# Patient Record
Sex: Female | Born: 1960
Health system: Southern US, Community
[De-identification: ages and names within clinical notes are randomized; demographics above are authoritative.]

## PROBLEM LIST (undated history)

## (undated) DIAGNOSIS — R0602 Shortness of breath: Secondary | ICD-10-CM

## (undated) DIAGNOSIS — E785 Hyperlipidemia, unspecified: Secondary | ICD-10-CM

## (undated) DIAGNOSIS — C801 Malignant (primary) neoplasm, unspecified: Secondary | ICD-10-CM

## (undated) DIAGNOSIS — M1990 Unspecified osteoarthritis, unspecified site: Secondary | ICD-10-CM

## (undated) DIAGNOSIS — F1921 Other psychoactive substance dependence, in remission: Secondary | ICD-10-CM

## (undated) DIAGNOSIS — R29898 Other symptoms and signs involving the musculoskeletal system: Secondary | ICD-10-CM

## (undated) DIAGNOSIS — Z87898 Personal history of other specified conditions: Secondary | ICD-10-CM

## (undated) DIAGNOSIS — R7303 Prediabetes: Secondary | ICD-10-CM

## (undated) DIAGNOSIS — B182 Chronic viral hepatitis C: Secondary | ICD-10-CM

## (undated) DIAGNOSIS — K644 Residual hemorrhoidal skin tags: Secondary | ICD-10-CM

## (undated) DIAGNOSIS — K6282 Dysplasia of anus: Secondary | ICD-10-CM

## (undated) DIAGNOSIS — Z8619 Personal history of other infectious and parasitic diseases: Secondary | ICD-10-CM

## (undated) DIAGNOSIS — G609 Hereditary and idiopathic neuropathy, unspecified: Secondary | ICD-10-CM

## (undated) DIAGNOSIS — Z9989 Dependence on other enabling machines and devices: Secondary | ICD-10-CM

## (undated) DIAGNOSIS — I10 Essential (primary) hypertension: Secondary | ICD-10-CM

## (undated) DIAGNOSIS — Z85830 Personal history of malignant neoplasm of bone: Secondary | ICD-10-CM

## (undated) DIAGNOSIS — M79601 Pain in right arm: Secondary | ICD-10-CM

## (undated) DIAGNOSIS — K449 Diaphragmatic hernia without obstruction or gangrene: Secondary | ICD-10-CM

## (undated) DIAGNOSIS — K5730 Diverticulosis of large intestine without perforation or abscess without bleeding: Secondary | ICD-10-CM

## (undated) DIAGNOSIS — K219 Gastro-esophageal reflux disease without esophagitis: Secondary | ICD-10-CM

## (undated) DIAGNOSIS — F1991 Other psychoactive substance use, unspecified, in remission: Secondary | ICD-10-CM

## (undated) DIAGNOSIS — Z8719 Personal history of other diseases of the digestive system: Secondary | ICD-10-CM

## (undated) DIAGNOSIS — M79602 Pain in left arm: Secondary | ICD-10-CM

## (undated) DIAGNOSIS — K643 Fourth degree hemorrhoids: Secondary | ICD-10-CM

## (undated) DIAGNOSIS — I219 Acute myocardial infarction, unspecified: Secondary | ICD-10-CM

## (undated) DIAGNOSIS — G4733 Obstructive sleep apnea (adult) (pediatric): Secondary | ICD-10-CM

## (undated) DIAGNOSIS — Z973 Presence of spectacles and contact lenses: Secondary | ICD-10-CM

## (undated) DIAGNOSIS — K862 Cyst of pancreas: Secondary | ICD-10-CM

## (undated) DIAGNOSIS — G4730 Sleep apnea, unspecified: Secondary | ICD-10-CM

## (undated) DIAGNOSIS — F1910 Other psychoactive substance abuse, uncomplicated: Secondary | ICD-10-CM

## (undated) DIAGNOSIS — K7460 Unspecified cirrhosis of liver: Secondary | ICD-10-CM

## (undated) HISTORY — PX: COLONOSCOPY WITH PROPOFOL: SHX5780

## (undated) HISTORY — PX: UPPER GASTROINTESTINAL ENDOSCOPY: SHX188

## (undated) HISTORY — DX: Dysplasia of anus: K62.82

## (undated) HISTORY — PX: ESOPHAGOGASTRODUODENOSCOPY (EGD) WITH PROPOFOL: SHX5813

## (undated) HISTORY — PX: COLONOSCOPY: SHX174

## (undated) HISTORY — PX: ESOPHAGOGASTRODUODENOSCOPY (EGD) WITH ESOPHAGEAL DILATION: SHX5812

## (undated) HISTORY — PX: TONSILLECTOMY: SUR1361

---
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
## 1980-04-02 HISTORY — PX: ABOVE KNEE LEG AMPUTATION: SUR20

## 1999-11-28 ENCOUNTER — Encounter
Admission: RE | Admit: 1999-11-28 | Discharge: 2000-02-26 | Payer: Self-pay | Admitting: Physical Medicine & Rehabilitation

## 2000-06-05 ENCOUNTER — Emergency Department (HOSPITAL_COMMUNITY): Admission: EM | Admit: 2000-06-05 | Discharge: 2000-06-05 | Payer: Self-pay | Admitting: Unknown Physician Specialty

## 2000-09-07 ENCOUNTER — Emergency Department (HOSPITAL_COMMUNITY): Admission: EM | Admit: 2000-09-07 | Discharge: 2000-09-07 | Payer: Self-pay | Admitting: Emergency Medicine

## 2000-10-09 ENCOUNTER — Encounter: Payer: Self-pay | Admitting: Emergency Medicine

## 2000-10-09 ENCOUNTER — Encounter (INDEPENDENT_AMBULATORY_CARE_PROVIDER_SITE_OTHER): Payer: Self-pay | Admitting: *Deleted

## 2000-10-10 ENCOUNTER — Inpatient Hospital Stay (HOSPITAL_COMMUNITY): Admission: EM | Admit: 2000-10-10 | Discharge: 2000-10-13 | Payer: Self-pay | Admitting: *Deleted

## 2001-03-19 ENCOUNTER — Emergency Department (HOSPITAL_COMMUNITY): Admission: EM | Admit: 2001-03-19 | Discharge: 2001-03-19 | Payer: Self-pay | Admitting: Emergency Medicine

## 2001-03-19 ENCOUNTER — Encounter: Payer: Self-pay | Admitting: Emergency Medicine

## 2003-11-11 ENCOUNTER — Emergency Department (HOSPITAL_COMMUNITY): Admission: EM | Admit: 2003-11-11 | Discharge: 2003-11-11 | Payer: Self-pay

## 2006-05-23 ENCOUNTER — Ambulatory Visit: Payer: Self-pay | Admitting: Internal Medicine

## 2006-05-24 ENCOUNTER — Ambulatory Visit: Payer: Self-pay | Admitting: *Deleted

## 2006-12-18 ENCOUNTER — Encounter (INDEPENDENT_AMBULATORY_CARE_PROVIDER_SITE_OTHER): Payer: Self-pay | Admitting: *Deleted

## 2007-05-21 ENCOUNTER — Encounter: Admission: RE | Admit: 2007-05-21 | Discharge: 2007-05-21 | Payer: Self-pay | Admitting: Family Medicine

## 2007-06-03 ENCOUNTER — Ambulatory Visit (HOSPITAL_COMMUNITY): Admission: RE | Admit: 2007-06-03 | Discharge: 2007-06-03 | Payer: Self-pay | Admitting: Obstetrics

## 2007-06-13 ENCOUNTER — Ambulatory Visit: Payer: Self-pay | Admitting: Gastroenterology

## 2007-06-13 DIAGNOSIS — R1012 Left upper quadrant pain: Secondary | ICD-10-CM | POA: Insufficient documentation

## 2007-06-13 DIAGNOSIS — R1319 Other dysphagia: Secondary | ICD-10-CM | POA: Insufficient documentation

## 2007-06-24 ENCOUNTER — Ambulatory Visit: Payer: Self-pay | Admitting: Gastroenterology

## 2007-07-25 ENCOUNTER — Ambulatory Visit: Payer: Self-pay | Admitting: Gastroenterology

## 2008-02-03 ENCOUNTER — Ambulatory Visit (HOSPITAL_COMMUNITY): Admission: RE | Admit: 2008-02-03 | Discharge: 2008-02-03 | Payer: Self-pay | Admitting: Obstetrics

## 2008-03-11 ENCOUNTER — Encounter: Admission: RE | Admit: 2008-03-11 | Discharge: 2008-03-11 | Payer: Self-pay | Admitting: Orthopedic Surgery

## 2008-04-15 ENCOUNTER — Ambulatory Visit: Payer: Self-pay | Admitting: Gastroenterology

## 2008-04-20 ENCOUNTER — Encounter: Payer: Self-pay | Admitting: Gastroenterology

## 2008-04-20 ENCOUNTER — Ambulatory Visit (HOSPITAL_COMMUNITY): Admission: RE | Admit: 2008-04-20 | Discharge: 2008-04-20 | Payer: Self-pay | Admitting: Gastroenterology

## 2008-04-28 ENCOUNTER — Ambulatory Visit (HOSPITAL_COMMUNITY): Admission: RE | Admit: 2008-04-28 | Discharge: 2008-04-28 | Payer: Self-pay | Admitting: Gastroenterology

## 2008-04-28 ENCOUNTER — Ambulatory Visit: Payer: Self-pay | Admitting: Gastroenterology

## 2008-06-21 ENCOUNTER — Telehealth: Payer: Self-pay | Admitting: Gastroenterology

## 2009-02-08 ENCOUNTER — Ambulatory Visit (HOSPITAL_COMMUNITY): Admission: RE | Admit: 2009-02-08 | Discharge: 2009-02-08 | Payer: Self-pay | Admitting: Family Medicine

## 2009-02-11 ENCOUNTER — Encounter (INDEPENDENT_AMBULATORY_CARE_PROVIDER_SITE_OTHER): Payer: Self-pay | Admitting: *Deleted

## 2009-02-14 ENCOUNTER — Ambulatory Visit: Payer: Self-pay | Admitting: Gastroenterology

## 2009-02-16 ENCOUNTER — Ambulatory Visit (HOSPITAL_COMMUNITY): Admission: RE | Admit: 2009-02-16 | Discharge: 2009-02-16 | Payer: Self-pay | Admitting: Family Medicine

## 2009-02-25 ENCOUNTER — Encounter: Admission: RE | Admit: 2009-02-25 | Discharge: 2009-02-25 | Payer: Self-pay | Admitting: Family Medicine

## 2009-02-28 ENCOUNTER — Ambulatory Visit: Payer: Self-pay | Admitting: Gastroenterology

## 2010-07-05 LAB — COMPREHENSIVE METABOLIC PANEL
ALT: 21 U/L (ref 0–35)
AST: 31 U/L (ref 0–37)
Albumin: 3.6 g/dL (ref 3.5–5.2)
Calcium: 9.5 mg/dL (ref 8.4–10.5)
GFR calc Af Amer: >60 mL/min (ref >60)
Sodium: 135 mEq/L (ref 135–145)
Total Protein: 7.4 g/dL (ref 6.0–8.3)

## 2010-07-05 LAB — CBC
MCHC: 35 g/dL (ref 30.0–36.0)
Platelets: 282 10*3/uL (ref 150–400)
RBC: 4.37 MIL/uL (ref 3.87–5.11)
RDW: 12.9 % (ref 11.5–15.5)

## 2010-07-05 LAB — LIPID PANEL
HDL: 56 mg/dL (ref >39)
LDL Cholesterol: 150 mg/dL — ABNORMAL HIGH (ref 0–99)
Triglycerides: 133 mg/dL (ref <150)
VLDL: 27 mg/dL (ref 0–40)

## 2010-08-15 NOTE — Letter (Signed)
June 13, 2007    [name]   RE:  [name], PREHN  MRN:  [id number]  /  DOB:  1961/01/27   Dear [name]:   It is my pleasure to have treated you recently as a new patient in my  office.  I appreciate your confidence and the opportunity to participate  in your care.   Since I do have a busy inpatient endoscopy schedule and office schedule,  my office hours vary weekly.  I am, however, available for emergency  calls every day through my office.  If I cannot promptly meet an urgent  office appointment, another one of our gastroenterologists will be able  to assist you.   My well-trained staff are prepared to help you at all times.  For  emergencies after office hours, a physician from our gastroenterology  section is always available through my 24-hour answering service.   While you are under my care, I encourage discussion of your questions  and concerns, and I will be happy to return your calls as soon as I am  available.   Once again, I welcome you as a new patient and I look forward to a happy  and healthy relationship.    Sincerely,      Barbette Hair. Arlyce Dice, MD,FACG  Electronically Signed   RDK/MedQ  DD: 06/13/2007  DT: 06/13/2007  Job #: [id number]

## 2010-08-15 NOTE — Letter (Signed)
June 13, 2007    Dr. Charlynne Pander. Thomas   RE:  Stacy, LIVAS  MRN:  [id number]  /  DOB:  03-Jan-1961   Dear Dr. Bruna Potter:   Upon your kind referral, I had the pleasure of evaluating your patient  and I am pleased to offer my findings.  I saw Stacy Thomas in the office  today.  Enclosed is a copy of my progress note that details my findings  and recommendations.   Thank you for the opportunity to participate in your patient's care.    Sincerely,      Barbette Hair. Arlyce Dice, MD,FACG  Electronically Signed    RDK/MedQ  DD: 06/13/2007  DT: 06/13/2007  Job #: [id number]

## 2010-08-15 NOTE — Assessment & Plan Note (Signed)
[facility]                         GASTROENTEROLOGY OFFICE NOTE   NAME:Stacy Thomas, Stacy Thomas                       MRN:          [id number]  DATE:07/25/2007                            DOB:          04-27-1960    PROBLEM:  Esophageal stricture.   Stacy Thomas has returned following upper endoscopy and esophageal  dilatation.  At this point, she feels quite well and has no GI  complaints.  She no longer has dysphagia or pain.   PHYSICAL EXAMINATION:  Pulse 82, blood pressure 104/80, weight 142.   IMPRESSION:  Esophageal stricture - asymptomatic following dilatation  therapy.   RECOMMENDATIONS:  Follow up p.r.n.     Barbette Hair. Arlyce Dice, MD,FACG  Electronically Signed    RDK/MedQ  DD: 07/25/2007  DT: 07/25/2007  Job #: [id number]   cc:   Clyda Greener, MD

## 2010-08-15 NOTE — Assessment & Plan Note (Signed)
[facility]                         GASTROENTEROLOGY OFFICE NOTE   NAME:Thomas Thomas Thomas                       MRN:          [id number]  DATE:06/13/2007                            DOB:          December 15, 1960    REFERRING PHYSICIAN:  ALVIN BLOUNT   REASON FOR CONSULTATION:  Epigastric pain.   Thomas Thomas is a pleasant, 50 year old African-American female, referred  through the courtesy of Dr. Bruna Potter for evaluation.  For several months,  she has been complaining of upper abdominal immediate postprandial  abdominal pain.  She actually has dysphagia to solids with onset of  pain.  She has occasional pyrosis.  She has had frequent episodes of  nausea with vomiting.  She is on no gastric irritants, including  nonsteroidals.  There is no history of change in bowel habits.   PAST MEDICAL HISTORY:  Pertinent for hypertension and  hypercholesterolemia.  She is status post left AKA for an osteosarcoma  in 1980.  She complains of recent soreness in the lower abdomen, which  she attributes to her new prosthesis.   FAMILY HISTORY:  Noncontributory.   MEDICATIONS:  Include Dyazide, Detrol-LA, Nasacort, fluticasone and  amoxicillin.   She is ALLERGIC TO MORPHINE.   She smokes a pack to a pack and a half a day.  She does not drink.  She  is single.   REVIEW OF SYSTEMS:  Positive for loss of hearing and leakage of urine.   PHYSICAL EXAMINATION:  Pulse is 80, blood pressure 118/74, weight 153.  HEENT: EOMI.  PERRLA.  Sclerae are anicteric.  Conjunctivae are pink.  NECK:  Supple without thyromegaly, adenopathy or carotid bruits.  CHEST:  Clear to auscultation and percussion without adventitious  sounds.  CARDIAC:  Regular rhythm; normal S1 S2.  There are no murmurs, gallops  or rubs.  ABDOMEN:  There is mild tenderness to palpation in the subxiphoid area  on abdominal exam.  There are no abdominal masses or organomegaly.  EXTREMITIES:  She is status post left AKA.   Full range of motion.  No  cyanosis, clubbing or edema.  RECTAL:  Deferred.   IMPRESSION:  Dysphagia to solids and postprandial upper abdominal pain.  I suspect that Thomas Thomas may have an esophageal stricture.  Active  peptic disease is also a possibility.   RECOMMENDATION:  1. Begin omeprazole 20 mg a day.  2. Upper endoscopy with dilatation as indicated.     Thomas Hair. Arlyce Dice, MD,FACG  Electronically Signed    RDK/MedQ  DD: 06/13/2007  DT: 06/13/2007  Job #: [id number]   cc:   Clyda Greener, MD

## 2010-08-18 NOTE — Discharge Summary (Signed)
[place]  Patient:    Stacy Thomas, Stacy Thomas                       MRN: [id number] Adm. Date:  [id number] Disc. Date: [id number] Attending:  Alfonso Ramus Dictator:   Shon Baton, M.D. CC:         Karlene Einstein, M.D.   Discharge Summary  DISCHARGE DIAGNOSES: 1. Cocaine abuse. 2. Urinary tract infection. 3. Nausea and vomiting. 4. Hypokalemia.  PROCEDURES:  None.  ADMISSION HISTORY:  The patient is a 50 year old black female with a history of polysubstance abuse, multiple admissions for cocaine abuse treatment, and pancreatitis, who presents to the emergency room with symptoms of malaise, nausea, and vomiting of approximately 24-hours duration.  She has been unable to keep liquids or solids down.  Despite attempts to administer p.o. hydration in the ER, she has vomited three times.  She reports associated abdominal pain but no diarrhea.  The patient was seen twice in the ER for the same problem with her last visit on September 07, 2000.  PAST MEDICAL HISTORY:  Notable for osteosarcoma in 1983 with a below the knee amputation.  REVIEW OF SYSTEMS:  The patient denies orthopnea, pedal edema, chest pain, shortness of breath, constipation.  PHYSICAL EXAMINATION:  GENERAL:  Sleepy, arousable, but irritable and uncooperative.  EYES:  Anicteric, no pallor.  CARDIOVASCULAR:  Regular rate and rhythm.  RESPIRATORY:  Clear to auscultation bilaterally.  ABDOMEN:  Soft, no organomegaly, tenderness over epigastrium.  BACK:  Positive for CVA tenderness on the right.  MUSCULOSKELETAL:  Below the knee amputation on the left leg.  NEUROLOGIC/PSYCHIATRIC:  Decreased affect, drowsy, no motor or sensory deficits.  ADMISSION LABORATORY DATA/X-RAY:  White blood cell count of 5.8, hemoglobin of 15.1, platelets 244.  Sodium 137, potassium 2.6, chloride 102, bicarbonate 30, BUN 10, creatinine 0.5, glucose 117.  AST 28, ALT 19, alkaline phosphatase  68. Urinalysis was positive for hemoglobin, ketones (15 mg per dl), urobilinogen, leukocyte esterase, epithelial cells, white blood cells 11-20, red blood cells, bacteria, granular casts.  UA was negative for glucose, bilirubin, pH 7.0.  Specific gravity 1.019.  Urine drug screen was positive for cocaine, negative for all other substances.  Lipase was 18.  HOSPITAL COURSE: #1 - COCAINE ABUSE:  The patient was noted to be intoxicated on admission physical exam.  Urine drug screen was positive for cocaine.  On meeting with physicians on July 11, the patient admitted to use of cocaine, and acknowledged a desire to seek treatment again.  When the care manager met with her on July 12, the patient denied the serious nature of her substance abuse. She is not interested in entering any of the local drug treatment programs. The care manager presented her with information on these programs and the patient was encouraged to seek treatment upon discharge.  #2 - URINARY TRACT INFECTION:  The patient presented to the ED with a urinalysis that was positive for many bacteria and leukocyte esterase.  In addition, she demonstrated CVA tenderness on admission physical exam.  It was thought that she had a urinary tract infection or pyelonephritis despite the fact that she was afebrile and the white blood cell count was normal.  She was placed on IV Cipro 400 mg once a day.  At discharge, it was felt that her UTI had cleared and the patient was not discharged on an oral regimen.  #3 - NAUSEA/VOMITING:  It was initially thought  that the nausea and vomiting was due to urinary tract infection.  Despite repeated trials over the first few days and advancing the diet from clear liquids, the patient continued to have episodes of emesis.  On extensive workup of this nausea and vomiting of unclear origins was initiated.  The patient was started on a trial of Protonix for possible gastritis/peptic ulcer disease.   Pelvic exam was done to rule out vaginitis or sexually transmitted disease.  But the pancreatitis was still a possible cause, and malaise and lipase were rechecked.  The last episode of emesis was on October 11, 2000, though nausea continued through October 12, 2000. At discharge on July 14, it was felt that her nausea was likely due to her prolonged cocaine binge.  Her diet was advanced and the patient tolerated solid foods prior to discharge.  #4 - HYPOKALEMIA:  Potassium was 2.6 on admission.  KCl was added to her IV fluids at a rate of 20 mEq per liter.  Potassium stabilized during her hospital course and KCl was dropped from IV fluids on July 13.  At discharge, potassium was 3.3.  DISCHARGE LABORATORY DATA:  Sodium 137, potassium 3.3, chloride 102, bicarbonate 27, BUN 4, creatinine 0.7, glucose 107.  Calcium 9.5.  TSH 2.175. Amylase 223, lipase 59.  Urine culture was positive for E. coli with a colony count of greater than 100,000 colonies per ml.  HIV test nonreactive. Gonorrhea/chlamydia pronegative.  HOSPITAL FOLLOWUP:  The patient has an appointment at the outpatient clinic [facility], Tuesday, October 15, 2000 at 2 p.m. DD:  10/14/00 TD:  10/14/00 Job: [id number]

## 2011-02-27 ENCOUNTER — Ambulatory Visit
Admission: RE | Admit: 2011-02-27 | Discharge: 2011-02-27 | Disposition: A | Discharge status: Home / Self Care | Payer: Medicaid Other | Source: Ambulatory Visit | Attending: Family Medicine | Admitting: Family Medicine

## 2011-02-27 ENCOUNTER — Other Ambulatory Visit: Payer: Self-pay | Admitting: Family Medicine

## 2011-02-27 DIAGNOSIS — J449 Chronic obstructive pulmonary disease, unspecified: Secondary | ICD-10-CM

## 2011-02-27 DIAGNOSIS — I10 Essential (primary) hypertension: Secondary | ICD-10-CM

## 2011-02-27 DIAGNOSIS — Z1231 Encounter for screening mammogram for malignant neoplasm of breast: Secondary | ICD-10-CM

## 2011-03-20 ENCOUNTER — Other Ambulatory Visit (HOSPITAL_COMMUNITY): Payer: Self-pay | Admitting: Orthopedic Surgery

## 2011-03-23 ENCOUNTER — Ambulatory Visit
Admission: RE | Admit: 2011-03-23 | Discharge: 2011-03-23 | Disposition: A | Discharge status: Home / Self Care | Payer: Medicaid Other | Source: Ambulatory Visit | Attending: Family Medicine | Admitting: Family Medicine

## 2011-03-23 DIAGNOSIS — Z1231 Encounter for screening mammogram for malignant neoplasm of breast: Secondary | ICD-10-CM

## 2011-04-06 ENCOUNTER — Other Ambulatory Visit: Payer: Self-pay | Admitting: Family Medicine

## 2011-04-06 DIAGNOSIS — R928 Other abnormal and inconclusive findings on diagnostic imaging of breast: Secondary | ICD-10-CM

## 2011-04-13 ENCOUNTER — Ambulatory Visit
Admission: RE | Admit: 2011-04-13 | Discharge: 2011-04-13 | Disposition: A | Discharge status: Home / Self Care | Payer: Medicaid Other | Source: Ambulatory Visit | Attending: Family Medicine | Admitting: Family Medicine

## 2011-04-13 ENCOUNTER — Other Ambulatory Visit: Payer: Self-pay | Admitting: Family Medicine

## 2011-04-13 DIAGNOSIS — R928 Other abnormal and inconclusive findings on diagnostic imaging of breast: Secondary | ICD-10-CM

## 2011-04-16 ENCOUNTER — Ambulatory Visit
Admission: RE | Admit: 2011-04-16 | Discharge: 2011-04-16 | Disposition: A | Discharge status: Home / Self Care | Payer: Medicaid Other | Source: Ambulatory Visit | Attending: Family Medicine | Admitting: Family Medicine

## 2011-04-16 ENCOUNTER — Other Ambulatory Visit: Payer: Self-pay | Admitting: Family Medicine

## 2011-04-16 DIAGNOSIS — R928 Other abnormal and inconclusive findings on diagnostic imaging of breast: Secondary | ICD-10-CM

## 2011-04-16 DIAGNOSIS — N6002 Solitary cyst of left breast: Secondary | ICD-10-CM

## 2011-04-16 DIAGNOSIS — N6320 Unspecified lump in the left breast, unspecified quadrant: Secondary | ICD-10-CM

## 2011-04-17 ENCOUNTER — Encounter (HOSPITAL_COMMUNITY): Payer: Self-pay | Admitting: Pharmacy Technician

## 2011-04-24 ENCOUNTER — Encounter (HOSPITAL_COMMUNITY): Payer: Self-pay

## 2011-04-24 ENCOUNTER — Encounter (HOSPITAL_COMMUNITY): Payer: Self-pay | Admitting: Vascular Surgery

## 2011-04-24 ENCOUNTER — Encounter (HOSPITAL_COMMUNITY)
Admission: RE | Admit: 2011-04-24 | Discharge: 2011-04-24 | Disposition: A | Discharge status: Home / Self Care | Payer: Medicaid Other | Source: Ambulatory Visit | Attending: Orthopedic Surgery | Admitting: Orthopedic Surgery

## 2011-04-24 ENCOUNTER — Other Ambulatory Visit: Payer: Self-pay

## 2011-04-24 DIAGNOSIS — Z01810 Encounter for preprocedural cardiovascular examination: Secondary | ICD-10-CM | POA: Insufficient documentation

## 2011-04-24 DIAGNOSIS — Z01812 Encounter for preprocedural laboratory examination: Secondary | ICD-10-CM | POA: Insufficient documentation

## 2011-04-24 DIAGNOSIS — Z01818 Encounter for other preprocedural examination: Secondary | ICD-10-CM | POA: Insufficient documentation

## 2011-04-24 HISTORY — DX: Essential (primary) hypertension: I10

## 2011-04-24 HISTORY — DX: Unspecified osteoarthritis, unspecified site: M19.90

## 2011-04-24 LAB — SURGICAL PCR SCREEN
MRSA, PCR: NEGATIVE
Staphylococcus aureus: POSITIVE — AB

## 2011-04-24 LAB — CBC
MCH: 31.2 pg (ref 26.0–34.0)
MCHC: 34.6 g/dL (ref 30.0–36.0)
Platelets: 211 10*3/uL (ref 150–400)

## 2011-04-24 LAB — COMPREHENSIVE METABOLIC PANEL
ALT: 24 U/L (ref 0–35)
AST: 41 U/L — ABNORMAL HIGH (ref 0–37)
Calcium: 10.5 mg/dL (ref 8.4–10.5)
Sodium: 139 mEq/L (ref 135–145)
Total Protein: 8.1 g/dL (ref 6.0–8.3)

## 2011-04-24 LAB — TYPE AND SCREEN: Antibody Screen: NEGATIVE

## 2011-04-24 LAB — HCG, SERUM, QUALITATIVE

## 2011-04-24 NOTE — Progress Notes (Signed)
Pt reports having intermittent menstrual cycles. Serum HCG obtained. Per Shawn in lab pt's level is at on border of being positive vs negative at 5.4 Spoke with [place], [place] regarding same. Lab is requesting specimen be recollected for verification.

## 2011-04-24 NOTE — Pre-Procedure Instructions (Signed)
20 Stacy Thomas  04/24/2011   Your procedure is scheduled on:  May 02, 2011  Report to [facility] at [phone] AM.  Call this number if you have problems the morning of surgery: [phone]   Remember:   Do not eat food:After Midnight.  May have clear liquids: up to 4 Hours before arrival.  Clear liquids include soda, tea, black coffee, apple or grape juice, broth.  Take these medicines the morning of surgery with A SIP OF WATER:    STOP Advil tomorrow use tylenol  Do not wear jewelry, make-up or nail polish.  Do not wear lotions, powders, or perfumes. You may wear deodorant.  Do not shave 48 hours prior to surgery.  Do not bring valuables to the hospital.  Contacts, dentures or bridgework may not be worn into surgery.  Leave suitcase in the car. After surgery it may be brought to your room.  For patients admitted to the hospital, checkout time is 11:00 AM the day of discharge.   Patients discharged the day of surgery will not be allowed to drive home.  Name and phone number of your driver: Brynda Rim  Special Instructions: CHG Shower Use Special Wash: 1/2 bottle night before surgery and 1/2 bottle morning of surgery.   Please read over the following fact sheets that you were given: Pain Booklet, Coughing and Deep Breathing, Blood Transfusion Information, Total Joint Packet, MRSA Information and Surgical Site Infection Prevention

## 2011-04-27 NOTE — Consult Note (Addendum)
Anesthesia:  Patient is a 51 year old female scheduled for a  Right TKA by Dr. Lajoyce Corners on 05/02/11.  Her history includes smoking, HTN, hypercholesterolemia, osteosarcoma s/p left AKA approximately 30 years ago, illicit drug use (quit 7 years ago).  CXR from 02/27/11 showed no acute cardiopulmonary process.  Labs reviewed.  Was notified by the PAT RN that lab called to report patient's serum HCG was borderline high at 5.4, and they did not feel comfortable calling the test negative or positive.  They recommended it be repeated preoperatively (already ordered).  I reviewed this with Dr. Noreene Larsson on 04/24/11.  He spoke with an  OB/GYN who agreed with this plan.  She indicated that if in fact the patient was pregnant, her HCG would be increased when rechecked on 05/02/11.  A repeated serum HCG, taken eight days after the previous test, that resulted in a level at or below her prior result would be more indicative of a negative test.    EKG showed NSR, septal infarct (age undetermined).  It was not felt significantly changed from her prior EKG from 02/08/09.  No CV symptoms reported at her PAT appointment.  No known history of CAD/MI or DM.  Clinical correlation on the day of surgery, but since her EKG is stable and she has been asymptomatic, then anticipate she can proceed if no new CV symptoms and if repeat serum HCG is negative.

## 2011-05-02 ENCOUNTER — Ambulatory Visit (HOSPITAL_COMMUNITY): Admission: RE | Admit: 2011-05-02 | Payer: Medicaid Other | Source: Ambulatory Visit | Admitting: Orthopedic Surgery

## 2011-05-02 ENCOUNTER — Encounter (HOSPITAL_COMMUNITY): Admission: RE | Payer: Self-pay | Source: Ambulatory Visit

## 2011-05-02 SURGERY — ARTHROPLASTY, KNEE, TOTAL
Anesthesia: General | Site: Knee | Laterality: Right

## 2011-08-23 ENCOUNTER — Other Ambulatory Visit (HOSPITAL_COMMUNITY): Payer: Self-pay | Admitting: Orthopedic Surgery

## 2011-08-30 ENCOUNTER — Encounter (HOSPITAL_COMMUNITY): Payer: Self-pay | Admitting: Vascular Surgery

## 2011-08-30 ENCOUNTER — Inpatient Hospital Stay (HOSPITAL_COMMUNITY): Admission: RE | Admit: 2011-08-30 | Discharge: 2011-08-30 | Payer: Medicaid Other | Source: Ambulatory Visit

## 2011-08-30 NOTE — Pre-Procedure Instructions (Signed)
20 Stacy Thomas  08/30/2011   Your procedure is scheduled on:  September 05, 2011 @ 8:30   Report to [facility] at 6:30 AM.  Call this number if you have problems the morning of surgery: [phone]   Remember:   Do not eat food:After Midnight.  May have clear liquids: up to 4 Hours before arrival.  Clear liquids include soda, tea, black coffee, apple or grape juice, broth.  Take these medicines the morning of surgery with A SIP OF WATER: pravachol (pravastatin)   Do not wear jewelry, make-up or nail polish.  Do not wear lotions, powders, or perfumes. You may wear deodorant.  Do not shave 48 hours prior to surgery. Men may shave face and neck.  Do not bring valuables to the hospital.  Contacts, dentures or bridgework may not be worn into surgery.  Leave suitcase in the car. After surgery it may be brought to your room.  For patients admitted to the hospital, checkout time is 11:00 AM the day of discharge.   Patients discharged the day of surgery will not be allowed to drive home.  Name and phone number of your driver: [name] (spouse) [phone]  Special Instructions: Incentive Spirometry - Practice and bring it with you on the day of surgery. and CHG Shower Use Special Wash: 1/2 bottle night before surgery and 1/2 bottle morning of surgery.   Please read over the following fact sheets that you were given: Pain Booklet, Coughing and Deep Breathing, Blood Transfusion Information, Total Joint Packet and Surgical Site Infection Prevention

## 2011-09-03 ENCOUNTER — Encounter (HOSPITAL_COMMUNITY)
Admission: RE | Admit: 2011-09-03 | Discharge: 2011-09-03 | Disposition: A | Discharge status: Home / Self Care | Payer: Medicaid Other | Source: Ambulatory Visit | Attending: Orthopedic Surgery | Admitting: Orthopedic Surgery

## 2011-09-03 DIAGNOSIS — Z01810 Encounter for preprocedural cardiovascular examination: Secondary | ICD-10-CM | POA: Insufficient documentation

## 2011-09-03 DIAGNOSIS — Z01812 Encounter for preprocedural laboratory examination: Secondary | ICD-10-CM | POA: Insufficient documentation

## 2011-09-03 LAB — COMPREHENSIVE METABOLIC PANEL
ALT: 26 U/L (ref 0–35)
AST: 46 U/L — ABNORMAL HIGH (ref 0–37)
Alkaline Phosphatase: 71 U/L (ref 39–117)
CO2: 21 mEq/L (ref 19–32)
GFR calc Af Amer: >90 mL/min (ref >90)
GFR calc non Af Amer: >90 mL/min (ref >90)
Glucose, Bld: 87 mg/dL (ref 70–99)
Potassium: 3.7 mEq/L (ref 3.5–5.1)
Sodium: 140 mEq/L (ref 135–145)

## 2011-09-03 LAB — SURGICAL PCR SCREEN: Staphylococcus aureus: POSITIVE — AB

## 2011-09-03 LAB — CBC
Hemoglobin: 13.6 g/dL (ref 12.0–15.0)
Platelets: 201 10*3/uL (ref 150–400)
RBC: 4.42 MIL/uL (ref 3.87–5.11)
WBC: 4.7 10*3/uL (ref 4.0–10.5)

## 2011-09-03 LAB — TYPE AND SCREEN: Antibody Screen: NEGATIVE

## 2011-09-03 LAB — APTT: aPTT: 25 seconds (ref 24–37)

## 2011-09-03 NOTE — Progress Notes (Signed)
Notified Cheryl in Dr. Audrie Lia office pt pregnancy test came back positive.

## 2011-09-05 ENCOUNTER — Ambulatory Visit (HOSPITAL_COMMUNITY): Admission: RE | Admit: 2011-09-05 | Payer: Medicaid Other | Source: Ambulatory Visit | Admitting: Orthopedic Surgery

## 2011-09-05 ENCOUNTER — Encounter (HOSPITAL_COMMUNITY): Admission: RE | Payer: Self-pay | Source: Ambulatory Visit

## 2011-09-05 SURGERY — ARTHROPLASTY, KNEE, TOTAL
Anesthesia: General | Site: Knee | Laterality: Right

## 2011-09-12 ENCOUNTER — Other Ambulatory Visit (HOSPITAL_COMMUNITY): Payer: Self-pay | Admitting: Obstetrics

## 2011-09-12 DIAGNOSIS — R102 Pelvic and perineal pain: Secondary | ICD-10-CM

## 2011-09-12 DIAGNOSIS — R109 Unspecified abdominal pain: Secondary | ICD-10-CM

## 2011-09-14 ENCOUNTER — Ambulatory Visit (HOSPITAL_COMMUNITY)
Admission: RE | Admit: 2011-09-14 | Discharge: 2011-09-14 | Disposition: A | Discharge status: Home / Self Care | Payer: Medicaid Other | Source: Ambulatory Visit | Attending: Obstetrics | Admitting: Obstetrics

## 2011-09-14 DIAGNOSIS — N838 Other noninflammatory disorders of ovary, fallopian tube and broad ligament: Secondary | ICD-10-CM | POA: Insufficient documentation

## 2011-09-14 DIAGNOSIS — R109 Unspecified abdominal pain: Secondary | ICD-10-CM

## 2011-09-14 DIAGNOSIS — R102 Pelvic and perineal pain unspecified side: Secondary | ICD-10-CM

## 2011-09-14 DIAGNOSIS — N949 Unspecified condition associated with female genital organs and menstrual cycle: Secondary | ICD-10-CM | POA: Insufficient documentation

## 2012-06-17 ENCOUNTER — Other Ambulatory Visit: Payer: Self-pay

## 2012-06-17 DIAGNOSIS — Z1231 Encounter for screening mammogram for malignant neoplasm of breast: Secondary | ICD-10-CM

## 2012-06-20 ENCOUNTER — Ambulatory Visit
Admission: RE | Admit: 2012-06-20 | Discharge: 2012-06-20 | Disposition: A | Discharge status: Home / Self Care | Payer: PRIVATE HEALTH INSURANCE | Source: Ambulatory Visit

## 2012-06-20 DIAGNOSIS — Z1231 Encounter for screening mammogram for malignant neoplasm of breast: Secondary | ICD-10-CM

## 2012-07-29 ENCOUNTER — Other Ambulatory Visit (HOSPITAL_COMMUNITY): Payer: PRIVATE HEALTH INSURANCE

## 2012-08-06 ENCOUNTER — Inpatient Hospital Stay: Admit: 2012-08-06 | Payer: Self-pay | Admitting: Orthopedic Surgery

## 2012-08-06 SURGERY — ARTHROPLASTY, KNEE, TOTAL
Anesthesia: General | Site: Knee | Laterality: Right

## 2013-04-02 HISTORY — PX: OTHER SURGICAL HISTORY: SHX169

## 2013-05-13 ENCOUNTER — Other Ambulatory Visit: Payer: Self-pay

## 2013-05-13 DIAGNOSIS — Z1231 Encounter for screening mammogram for malignant neoplasm of breast: Secondary | ICD-10-CM

## 2013-06-22 ENCOUNTER — Ambulatory Visit: Payer: PRIVATE HEALTH INSURANCE

## 2013-06-23 ENCOUNTER — Other Ambulatory Visit (HOSPITAL_COMMUNITY): Payer: Self-pay | Admitting: Family Medicine

## 2013-06-23 DIAGNOSIS — Z1231 Encounter for screening mammogram for malignant neoplasm of breast: Secondary | ICD-10-CM

## 2013-07-01 ENCOUNTER — Ambulatory Visit (HOSPITAL_COMMUNITY)
Admission: RE | Admit: 2013-07-01 | Discharge: 2013-07-01 | Disposition: A | Discharge status: Home / Self Care | Payer: PRIVATE HEALTH INSURANCE | Source: Ambulatory Visit | Attending: Family Medicine | Admitting: Family Medicine

## 2013-07-01 DIAGNOSIS — Z1231 Encounter for screening mammogram for malignant neoplasm of breast: Secondary | ICD-10-CM

## 2013-11-13 ENCOUNTER — Ambulatory Visit: Payer: PRIVATE HEALTH INSURANCE

## 2014-06-14 ENCOUNTER — Other Ambulatory Visit (HOSPITAL_COMMUNITY): Payer: Self-pay | Admitting: Family Medicine

## 2014-06-14 DIAGNOSIS — Z1231 Encounter for screening mammogram for malignant neoplasm of breast: Secondary | ICD-10-CM

## 2014-07-07 ENCOUNTER — Ambulatory Visit (HOSPITAL_COMMUNITY)
Admission: RE | Admit: 2014-07-07 | Discharge: 2014-07-07 | Disposition: A | Discharge status: Home / Self Care | Payer: 59 | Source: Ambulatory Visit | Attending: Family Medicine | Admitting: Family Medicine

## 2014-07-07 DIAGNOSIS — Z1231 Encounter for screening mammogram for malignant neoplasm of breast: Secondary | ICD-10-CM | POA: Diagnosis present

## 2014-08-27 ENCOUNTER — Encounter: Payer: Self-pay | Admitting: Gastroenterology

## 2014-09-02 ENCOUNTER — Other Ambulatory Visit: Payer: Self-pay | Admitting: Family Medicine

## 2014-09-02 ENCOUNTER — Ambulatory Visit
Admission: RE | Admit: 2014-09-02 | Discharge: 2014-09-02 | Disposition: A | Discharge status: Home / Self Care | Payer: 59 | Source: Ambulatory Visit | Attending: Family Medicine | Admitting: Family Medicine

## 2014-09-02 DIAGNOSIS — R1084 Generalized abdominal pain: Secondary | ICD-10-CM

## 2014-09-02 DIAGNOSIS — I10 Essential (primary) hypertension: Secondary | ICD-10-CM

## 2014-10-21 ENCOUNTER — Encounter: Payer: Self-pay | Admitting: Physical Therapy

## 2014-10-21 ENCOUNTER — Ambulatory Visit: Payer: 59 | Attending: Family Medicine | Admitting: Physical Therapy

## 2014-10-21 DIAGNOSIS — R269 Unspecified abnormalities of gait and mobility: Secondary | ICD-10-CM | POA: Diagnosis present

## 2014-10-21 DIAGNOSIS — Z89612 Acquired absence of left leg above knee: Secondary | ICD-10-CM | POA: Insufficient documentation

## 2014-10-22 NOTE — Therapy (Signed)
[facility] [address] [place], [place], [address] Phone: [phone]   Fax:  [phone]  Physical Therapy Evaluation  Patient Details  Name: Stacy Thomas MRN: [id number] Date of Birth: Jul 28, 1960 Referring Provider:  Elizabeth Palau, *  Encounter Date: 10/21/2014      PT End of Session - 10/21/14 1015    Visit Number 1   Number of Visits 1   PT Start Time 1010   PT Stop Time 1100   PT Time Calculation (min) 50 min   Activity Tolerance Patient limited by pain   Behavior During Therapy [facility] for tasks assessed/performed      Past Medical History  Diagnosis Date  . Hypertension   . Hypercholesterolemia   . 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 Arthritis   . Osteoarthritis   . Drug abuse     Past Surgical History  Procedure Laterality Date  . Above knee leg amputation  ~ 30 years    left  . Breast biopsy      determine to be a cyst  . Tonsillectomy      child    There were no vitals filed for this visit.  Visit Diagnosis:  Abnormality of gait  Status post above knee amputation of left lower extremity      Subjective Assessment - 10/22/14 0600    Subjective This 54yo female underwent a left Transfemoral Amputation in 1982 due osteosarcoma with no signs of return or METS. She recieved prosthesis (IPOP in hospital) and temporary prosthesis within few months. Her current prosthesis was recieved 11/26/2006 with swing hydraulic knee with stance flexion feature with extension assist, foot Flex walk system; Socket revision June 2014 with suction ring gel liner suspension. She complains of prosthetic issues of distal lateral pain 10/10 with weight bearing, cracked rim on flexible inner socket, knee buckles causing occassional falls, socket turns with gait due to loose fit, posterior brim pushes into buttocks with stance and weight of prosthesis (weighed during eval at 11#). She presents for  PT evaluation to justify new prosthesis.                                [facility] PT Assessment - 10/21/14 1015    Assessment   Medical Diagnosis Left Transfemoral Amputation   Precautions   Precautions Fall   Restrictions   Weight Bearing Restrictions No   Balance Screen   Has the patient fallen in the past 6 months Yes   How many times? ~2 x/wk  sharp pains & knee buckles   Has the patient had a decrease in activity level because of a fear of falling?  Yes   Is the patient reluctant to leave their home because of a fear of falling?  No   Home Environment   Living Environment Private residence   Living Arrangements Spouse/significant other   Type of [place] One level  laundry room has 3 steps no rails   Home Equipment Crutches   Prior Function   Level of Independence Independent;Independent with household mobility without device;Independent with community mobility without device;Independent with gait   Posture/Postural Control   Posture/Postural Control Postural limitations   Postural Limitations Rounded Shoulders;Forward head;Increased lumbar lordosis;Flexed trunk;Weight shift right   ROM / Strength   AROM / PROM / Strength PROM;Strength  PROM   PROM Assessment Site Hip   Right/Left Hip Left   Left Hip Extension -18  Thomas position   Strength   Overall Strength Within functional limits for tasks performed   Strength Assessment Site Hip   Right/Left Hip Left   Left Hip Flexion 5/5   Left Hip Extension 4+/5   Left Hip ABduction 4+/5   Left Hip ADduction 4+/5   Transfers   Transfers Sit to Stand;Stand to Sit;Stand Pivot Transfers   Sit to Stand 6: Modified independent (Device/Increase time);With upper extremity assist;From chair/3-in-1   Stand to Sit 6: Modified independent (Device/Increase time);With upper extremity assist   Stand Pivot Transfers 6: Modified independent (Device/Increase time)   Ambulation/Gait    Ambulation/Gait Yes   Ambulation/Gait Assistance 6: Modified independent (Device/Increase time)   Ambulation Distance (Feet) 700 Feet   Assistive device Prosthesis;None   Gait Pattern Abducted - left;Poor foot clearance - left;Trunk flexed;Trunk rotated posteriorly on left;Lateral trunk lean to right;Antalgic;Left circumduction;Left hip hike;Decreased hip/knee flexion - left;Decreased weight shift to left;Decreased stride length;Decreased stance time - left;Decreased step length - right;Decreased arm swing - left;Step-through pattern   Ambulation Surface Indoor;Level   Gait velocity 1.17 ft/sec   Gait velocity - backwards safe at similar speed to forward, no balance loss   Stairs Yes   Stairs Assistance 6: Modified independent (Device/Increase time)   Stair Management Technique One rail Right;Step to pattern;Forwards   Number of Stairs 4   Ramp 6: Modified independent (Device)  prosthesis only   Ramp Details (indicate cue type and reason) Keeps prosthetic knee locked, abducted   Curb 6: Modified independent (Device/increase time)  prosthesis only   Curb Details (indicate cue type and reason) abduction of prosthesis   6 Minute Walk- Baseline   6 Minute Walk- Baseline yes   BP (mmHg) 114/77 mmHg   HR (bpm) 75   02 Sat (%RA) 99 %   Modified Borg Scale for Dyspnea 0- Nothing at all   6 Minute walk- Post Test   6 Minute Walk Post Test yes   HR (bpm) 84   02 Sat (%RA) 95 %   Modified Borg Scale for Dyspnea 1- Very mild shortness of breath   Perceived Rate of Exertion (Borg) 9- very light   6 minute walk test results    Aerobic Endurance Distance Walked 403   Endurance additional comments Pain 10/10 limiting distance with 4 standing rests 2 leaning against wall   Berg Balance Test   Sit to Stand Able to stand without using hands and stabilize independently   Standing Unsupported Able to stand safely 2 minutes   Sitting with Back Unsupported but Feet Supported on Floor or Stool Able to  sit safely and securely 2 minutes   Stand to Sit Sits safely with minimal use of hands   Transfers Able to transfer safely, minor use of hands   Standing Unsupported with Eyes Closed Able to stand 10 seconds safely   Standing Ubsupported with Feet Together Able to place feet together independently and stand 1 minute safely   From Standing, Reach Forward with Outstretched Arm Can reach confidently >25 cm (10")   From Standing Position, Pick up Object from Floor Able to pick up shoe safely and easily   From Standing Position, Turn to Look Behind Over each Shoulder Looks behind one side only/other side shows less weight shift   Turn 360 Degrees Able to turn 360 degrees safely but slowly   Standing Unsupported, Alternately Place Feet  on Step/Stool Able to stand independently and safely and complete 8 steps in 20 seconds   Standing Unsupported, One Foot in Front Able to plae foot ahead of the other independently and hold 30 seconds   Standing on One Leg Able to lift leg independently and hold 5-10 seconds   Total Score 51   Timed Up and Go Test   Normal TUG (seconds) 12.69   Functional Gait  Assessment   Gait assessed  Yes   Gait Level Surface Walks 20 ft in less than 7 sec but greater than 5.5 sec, uses assistive device, slower speed, mild gait deviations, or deviates 6-10 in outside of the 12 in walkway width.   Change in Gait Speed Able to change speed, demonstrates mild gait deviations, deviates 6-10 in outside of the 12 in walkway width, or no gait deviations, unable to achieve a major change in velocity, or uses a change in velocity, or uses an assistive device.   Gait with Horizontal Head Turns Performs head turns smoothly with no change in gait. Deviates no more than 6 in outside 12 in walkway width   Gait with Vertical Head Turns Performs head turns with no change in gait. Deviates no more than 6 in outside 12 in walkway width.   Gait and Pivot Turn Pivot turns safely in greater than 3 sec  and stops with no loss of balance, or pivot turns safely within 3 sec and stops with mild imbalance, requires small steps to catch balance.   Step Over Obstacle Is able to step over one shoe box (4.5 in total height) without changing gait speed. No evidence of imbalance.   Gait with Narrow Base of Support Ambulates less than 4 steps heel to toe or cannot perform without assistance.   Gait with Eyes Closed Walks 20 ft, no assistive devices, good speed, no evidence of imbalance, normal gait pattern, deviates no more than 6 in outside 12 in walkway width. Ambulates 20 ft in less than 7 sec.   Ambulating Backwards Walks 20 ft, no assistive devices, good speed, no evidence for imbalance, normal gait   Steps Two feet to a stair, must use rail.   Total Score 21         Prosthetics Assessment - 10/21/14 1015    Prosthetics   Prosthetic Care Independent with Skin check;Residual limb care;Prosthetic cleaning;Correct ply sock adjustment;Proper wear schedule/adjustment;Proper weight-bearing schedule/adjustment   Donning prosthesis  Independent   Doffing prosthesis  Independent   Current prosthetic wear tolerance (days/week)  7 days/wk   Current prosthetic wear tolerance (#hours/day)  >90% of awake hours   Current prosthetic weight-bearing tolerance (hours/day)  tolerated 15 minutes of standing for Berg [employer] Gai[employer]   Edema none   Residual limb condition  Distal limb has significant decline in circumference with femur prominent, callous present at distal femur with no adherance with severe tenderness to touch, tissu proximally is loose, flabby muscle / soft tissue, callousing present at ischial tuberosity with pt c/o pressure into rectum with wt on prosthesis, no open areas, normal moisture,    K code/activity level with prosthetic use  K3 Patient ambulates full community level without device and can vary cadence as needed by environment                                       Plan  - 10/21/14 1015    Clinical Impression  Statement This 54yo female has been functioning with a prosthesis for 32 years. Her current 54yo prosthetic knee and foot are K3 hydraulic (allows variable cadence) and dynamic response. These components are required to enable her to continue to function at full community level with lower fall risk. The current prosthesis being ill-fitting is increasing her chronic pain issues of right side sciatica and left side residual limb / phantom pain. A proper fitting prosthesis should decrease pain for both issues. Her Berg Balance score of 51/56 indicates low fall risk and Fucntional Gait Assessment of 21/30 with Transfemoral Amputation indicates community level mobility. PT recommends new K3 level prosthesis with similar components to her current prosthesis: gel silicon liner with seal-in suction ring, ischial containment socket with flexible inner liner brim, K3 hydraulic swing, multi-axial knee that locks with heel strike, stance flexion feature with extension assist (Total Knee), Flex Walk System foot.                        Pt will benefit from skilled therapeutic intervention in order to improve on the following deficits Abnormal gait;Pain   PT Frequency One time visit   PT Next Visit Plan evaluation only   Recommended Other Services PT recommends new K3 level prosthesis with similar components to her current prosthesis: gel silicon liner with seal-in suction ring, ischial containment socket with flexible inner liner brim, K3 hydraulic swing, multi-axial knee that locks with heel strike, stance flexion feature with extension assist (Total Knee), Flex Walk System foot.                        Consulted and Agree with Plan of Care Patient         Problem List Patient Active Problem List   Diagnosis Date Noted  . DYSPHAGIA 06/13/2007  . ABDOMINAL PAIN-LUQ 06/13/2007    Jamey Reas PT, DPT 10/22/2014, 12:12 PM  [facility] [address] [place], [place], [address] Phone: [phone]   Fax:  [phone]

## 2015-01-03 ENCOUNTER — Encounter (HOSPITAL_COMMUNITY): Payer: Self-pay | Admitting: *Deleted

## 2015-01-03 ENCOUNTER — Emergency Department (HOSPITAL_COMMUNITY)
Admission: EM | Admit: 2015-01-03 | Discharge: 2015-01-03 | Disposition: A | Discharge status: Home / Self Care | Payer: 59 | Attending: Emergency Medicine | Admitting: Emergency Medicine

## 2015-01-03 DIAGNOSIS — I10 Essential (primary) hypertension: Secondary | ICD-10-CM | POA: Diagnosis not present

## 2015-01-03 DIAGNOSIS — Z85830 Personal history of malignant neoplasm of bone: Secondary | ICD-10-CM | POA: Insufficient documentation

## 2015-01-03 DIAGNOSIS — K625 Hemorrhage of anus and rectum: Secondary | ICD-10-CM | POA: Diagnosis present

## 2015-01-03 DIAGNOSIS — Z79899 Other long term (current) drug therapy: Secondary | ICD-10-CM | POA: Diagnosis not present

## 2015-01-03 DIAGNOSIS — E7800 Pure hypercholesterolemia, unspecified: Secondary | ICD-10-CM | POA: Diagnosis not present

## 2015-01-03 DIAGNOSIS — K644 Residual hemorrhoidal skin tags: Secondary | ICD-10-CM | POA: Diagnosis not present

## 2015-01-03 DIAGNOSIS — Z720 Tobacco use: Secondary | ICD-10-CM | POA: Diagnosis not present

## 2015-01-03 DIAGNOSIS — K649 Unspecified hemorrhoids: Secondary | ICD-10-CM

## 2015-01-03 DIAGNOSIS — M1990 Unspecified osteoarthritis, unspecified site: Secondary | ICD-10-CM | POA: Insufficient documentation

## 2015-01-03 LAB — COMPREHENSIVE METABOLIC PANEL
ALBUMIN: 3.4 g/dL — AB (ref 3.5–5.0)
ALT: 64 U/L — ABNORMAL HIGH (ref 14–54)
ANION GAP: 7 (ref 5–15)
AST: 92 U/L — ABNORMAL HIGH (ref 15–41)
Alkaline Phosphatase: 94 U/L (ref 38–126)
BUN: 11 mg/dL (ref 6–20)
CALCIUM: 9.4 mg/dL (ref 8.9–10.3)
CO2: 25 mmol/L (ref 22–32)
Chloride: 107 mmol/L (ref 101–111)
Creatinine, Ser: 1.14 mg/dL — ABNORMAL HIGH (ref 0.44–1.00)
GFR, EST NON AFRICAN AMERICAN: 53 mL/min — AB (ref >60)
GLUCOSE: 124 mg/dL — AB (ref 65–99)
POTASSIUM: 3.7 mmol/L (ref 3.5–5.1)
Sodium: 139 mmol/L (ref 135–145)
TOTAL PROTEIN: 6.7 g/dL (ref 6.5–8.1)
Total Bilirubin: 1 mg/dL (ref 0.3–1.2)

## 2015-01-03 LAB — CBC
HEMATOCRIT: 39.4 % (ref 36.0–46.0)
HEMOGLOBIN: 13.6 g/dL (ref 12.0–15.0)
MCH: 32 pg (ref 26.0–34.0)
MCHC: 34.5 g/dL (ref 30.0–36.0)
MCV: 92.7 fL (ref 78.0–100.0)
Platelets: 153 10*3/uL (ref 150–400)
RBC: 4.25 MIL/uL (ref 3.87–5.11)
RDW: 12.6 % (ref 11.5–15.5)
WBC: 4.7 10*3/uL (ref 4.0–10.5)

## 2015-01-03 MED ORDER — HYDROCORTISONE ACETATE 25 MG RE SUPP: 25.0000 mg | Freq: Two times a day (BID) | RECTAL | Status: DC

## 2015-01-03 MED ORDER — DOCUSATE SODIUM 100 MG PO CAPS: 100.0000 mg | ORAL_CAPSULE | Freq: Two times a day (BID) | ORAL | Status: DC

## 2015-01-03 NOTE — ED Notes (Signed)
Patient refused discharge vitals.

## 2015-01-03 NOTE — ED Notes (Signed)
Pt st's she has had bleeding from rectum when she has a BM for a while.  Pt st's she has hemorrhoids

## 2015-01-03 NOTE — ED Notes (Signed)
Pt reports having hemorrhoids and rectal bleeding, causing pain. No acute distress noted.

## 2015-01-03 NOTE — Discharge Instructions (Signed)
Disposable Sitz Bath A disposable sitz bath is a plastic basin that fits over the toilet. A bag is hung above the toilet and is connected to a tube that opens into the disposable sitz bath. The bag is filled with warm water that can flow into the basin through the tube.  HOW TO USE A DISPOSABLE SITZ BATH  Close the clamp on the tubing before filling the bag with water. This is to prevent leakage.  Fill the sitz bath basin and the plastic bag with warm water.  Place the filled basin on the toilet with the seat raised. Make sure the overflow opening is facing toward the back of the toilet.  Hang the filled plastic bag overhead on a hook or towel rack close to the toilet. When the bag is unclamped, a steady stream of water will flow from the bag, through the tubing, and into the basin.  Attach the tubing to the opening on the basin.  Sit on the basin positioned on the toilet seat and release the clamp. This will allow warm water to flush the area around your genitals and anus (perineum).  Remain sitting on the basin for approximately 15 to 20 minutes.  Stand up and pat the perineum area dry. If needed, apply clean bandages (dressings) to the affected area.  Tip the basin into the toilet to remove any remaining water and flush the toilet.  Wash the basin with warm water and soap. Let it dry in the sink.  Store the basin and tubing in a clean, dry area.  Wash your hands with soap and water. SEEK MEDICAL CARE IF: You get worse instead of better. Stop the sitz baths if you get worse. MAKE SURE YOU:  Understand these instructions.  Will watch your condition.  Will get help right away if you are not doing well or get worse. Document Released: 09/18/2011 Document Revised: 12/12/2011 Document Reviewed: 09/18/2011 [facility] Patient Information 2015 [place], [place]. This information is not intended to replace advice given to you by your health care provider. Make sure you discuss any questions  you have with your health care provider.  Hemorrhoids Hemorrhoids are swollen veins around the rectum or anus. There are two types of hemorrhoids:   Internal hemorrhoids. These occur in the veins just inside the rectum. They may poke through to the outside and become irritated and painful.  External hemorrhoids. These occur in the veins outside the anus and can be felt as a painful swelling or hard lump near the anus. CAUSES  Pregnancy.   Obesity.   Constipation or diarrhea.   Straining to have a bowel movement.   Sitting for long periods on the toilet.  Heavy lifting or other activity that caused you to strain.  Anal intercourse. SYMPTOMS   Pain.   Anal itching or irritation.   Rectal bleeding.   Fecal leakage.   Anal swelling.   One or more lumps around the anus.  DIAGNOSIS  Your caregiver may be able to diagnose hemorrhoids by visual examination. Other examinations or tests that may be performed include:   Examination of the rectal area with a gloved hand (digital rectal exam).   Examination of anal canal using a small tube (scope).   A blood test if you have lost a significant amount of blood.  A test to look inside the colon (sigmoidoscopy or colonoscopy). TREATMENT Most hemorrhoids can be treated at home. However, if symptoms do not seem to be getting better or if you have  a lot of rectal bleeding, your caregiver may perform a procedure to help make the hemorrhoids get smaller or remove them completely. Possible treatments include:   Placing a rubber band at the base of the hemorrhoid to cut off the circulation (rubber band ligation).   Injecting a chemical to shrink the hemorrhoid (sclerotherapy).   Using a tool to burn the hemorrhoid (infrared light therapy).   Surgically removing the hemorrhoid (hemorrhoidectomy).   Stapling the hemorrhoid to block blood flow to the tissue (hemorrhoid stapling).  HOME CARE INSTRUCTIONS   Eat foods  with fiber, such as whole grains, beans, nuts, fruits, and vegetables. Ask your doctor about taking products with added fiber in them (fibersupplements).  Increase fluid intake. Drink enough water and fluids to keep your urine clear or pale yellow.   Exercise regularly.   Go to the bathroom when you have the urge to have a bowel movement. Do not wait.   Avoid straining to have bowel movements.   Keep the anal area dry and clean. Use wet toilet paper or moist towelettes after a bowel movement.   Medicated creams and suppositories may be used or applied as directed.   Only take over-the-counter or prescription medicines as directed by your caregiver.   Take warm sitz baths for 15-20 minutes, 3-4 times a day to ease pain and discomfort.   Place ice packs on the hemorrhoids if they are tender and swollen. Using ice packs between sitz baths may be helpful.   Put ice in a plastic bag.   Place a towel between your skin and the bag.   Leave the ice on for 15-20 minutes, 3-4 times a day.   Do not use a donut-shaped pillow or sit on the toilet for long periods. This increases blood pooling and pain.  SEEK MEDICAL CARE IF:  You have increasing pain and swelling that is not controlled by treatment or medicine.  You have uncontrolled bleeding.  You have difficulty or you are unable to have a bowel movement.  You have pain or inflammation outside the area of the hemorrhoids. MAKE SURE YOU:  Understand these instructions.  Will watch your condition.  Will get help right away if you are not doing well or get worse. Document Released: 03/16/2000 Document Revised: 03/05/2012 Document Reviewed: 01/22/2012 [facility] Patient Information 2015 [place], [place]. This information is not intended to replace advice given to you by your health care provider. Make sure you discuss any questions you have with your health care provider.

## 2015-01-03 NOTE — ED Provider Notes (Signed)
CSN: [id number]     Arrival date & time 01/03/15  1354 History   First MD Initiated Contact with Patient 01/03/15 1616     Chief Complaint  Patient presents with  . Rectal Bleeding     (Consider location/radiation/quality/duration/timing/severity/associated sxs/prior Treatment) Patient is a 54 y.o. female presenting with hematochezia. The history is provided by the patient.  Rectal Bleeding Quality:  Bright red Amount:  Scant Timing:  Intermittent Progression:  Unchanged Chronicity:  Recurrent Context: hemorrhoids   Similar prior episodes: no   Relieved by:  Nothing Worsened by:  Nothing tried Associated symptoms: no epistaxis, no fever, no hematemesis, no light-headedness, no loss of consciousness and no vomiting     Past Medical History  Diagnosis Date  . Hypertension   . Hypercholesterolemia   . Osteosarcoma of bone ([place]) ~ 30 years ago    left leg  . Arthritis   . Osteoarthritis   . Drug abuse    Past Surgical History  Procedure Laterality Date  . Above knee leg amputation  ~ 30 years    left  . Breast biopsy      determine to be a cyst  . Tonsillectomy      child   Family History  Problem Relation Age of Onset  . Anesthesia problems Neg Hx    Social History  Substance Use Topics  . Smoking status: Current Every Day Smoker -- 1.00 packs/day for 37 years    Types: Cigarettes  . Smokeless tobacco: None  . Alcohol Use: No   OB History    No data available     Review of Systems  Constitutional: Negative for fever.  HENT: Negative for nosebleeds.   Respiratory: Negative for cough and shortness of breath.   Gastrointestinal: Positive for hematochezia. Negative for vomiting and hematemesis.  Neurological: Negative for loss of consciousness and light-headedness.  All other systems reviewed and are negative.     Allergies  Codeine and Morphine  Home Medications   Prior to Admission medications   Medication Sig Start Date End Date Taking?  Authorizing Provider  docusate sodium (COLACE) 100 MG capsule Take 1 capsule (100 mg total) by mouth every 12 (twelve) hours. 01/03/15   Evelina Bucy, MD  fenofibrate (TRICOR) 48 MG tablet Take 48 mg by mouth daily.    Historical Provider, MD  hydrocortisone (ANUSOL-HC) 25 MG suppository Place 1 suppository (25 mg total) rectally 2 (two) times daily. For 7 days 01/03/15   Evelina Bucy, MD  ibuprofen (ADVIL) 200 MG tablet Take 200 mg by mouth every 6 (six) hours as needed. For pain    Historical Provider, MD  pravastatin (PRAVACHOL) 40 MG tablet Take 40 mg by mouth daily.    Historical Provider, MD  triamterene-hydrochlorothiazide (MAXZIDE-25) 37.5-25 MG per tablet Take 1 tablet by mouth daily.    Historical Provider, MD   BP 102/70 mmHg  Pulse 84  Temp(Src) 97.9 F (36.6 C) (Oral)  Resp 18  SpO2 98%  LMP 12/02/2010 Physical Exam  Constitutional: She is oriented to person, place, and time. She appears well-developed and well-nourished. No distress.  HENT:  Head: Normocephalic and atraumatic.  Mouth/Throat: Oropharynx is clear and moist.  Eyes: EOM are normal. Pupils are equal, round, and reactive to light.  Neck: Normal range of motion. Neck supple.  Cardiovascular: Normal rate and regular rhythm.  Exam reveals no friction rub.   No murmur heard. Pulmonary/Chest: Effort normal and breath sounds normal. No respiratory distress. She has no wheezes. She  has no rales.  Abdominal: Soft. She exhibits no distension. There is no tenderness. There is no rebound.  Genitourinary: Rectal exam shows external hemorrhoid (multiple external hemorrhoids, circumferential around anus. One spot of mild bleeding.) and tenderness (mild). Rectal exam shows no internal hemorrhoid and no mass.  No thrombosed hemorrhoids  Musculoskeletal: Normal range of motion. She exhibits no edema.  Neurological: She is alert and oriented to person, place, and time.  Skin: No rash noted. She is not diaphoretic.  Nursing note  and vitals reviewed.   ED Course  Procedures (including critical care time) Labs Review Labs Reviewed  COMPREHENSIVE METABOLIC PANEL - Abnormal; Notable for the following:    Glucose, Bld 124 (*)    Creatinine, Ser 1.14 (*)    Albumin 3.4 (*)    AST 92 (*)    ALT 64 (*)    GFR calc non Af Amer 53 (*)    All other components within normal limits  CBC  POC OCCULT BLOOD, ED    Imaging Review No results found. I have personally reviewed and evaluated these images and lab results as part of my medical decision-making.   EKG Interpretation None      MDM   Final diagnoses:  Hemorrhoids, unspecified hemorrhoid type    54 year old female here with rectal bleeding. She does have history of hemorrhoids. Here she has multiple hemorrhoids, all are soft and nonthrombosed. She has mild area that is bleeding at this time. She has no masses internally concerning for perirectal abscess. She has no fever, vomiting, other abdominal pain. Here vitals are stable. With no thrombosed hemorrhoids, recommended sitz baths, Anusol suppositories, stool softeners, general surgery follow-up. Stable for discharge.    Evelina Bucy, MD 01/03/15 [phone]

## 2015-06-13 ENCOUNTER — Encounter: Payer: Self-pay | Admitting: Internal Medicine

## 2015-06-13 ENCOUNTER — Ambulatory Visit (INDEPENDENT_AMBULATORY_CARE_PROVIDER_SITE_OTHER): Payer: Commercial Managed Care - HMO | Admitting: Internal Medicine

## 2015-06-13 VITALS — BP 114/78 | HR 101 | Temp 98.0°F | Ht 61.0 in | Wt 152.0 lb

## 2015-06-13 DIAGNOSIS — R740 Nonspecific elevation of levels of transaminase and lactic acid dehydrogenase [LDH]: Secondary | ICD-10-CM

## 2015-06-13 DIAGNOSIS — G609 Hereditary and idiopathic neuropathy, unspecified: Secondary | ICD-10-CM | POA: Diagnosis not present

## 2015-06-13 DIAGNOSIS — B182 Chronic viral hepatitis C: Secondary | ICD-10-CM

## 2015-06-13 DIAGNOSIS — K648 Other hemorrhoids: Secondary | ICD-10-CM

## 2015-06-13 DIAGNOSIS — Z0000 Encounter for general adult medical examination without abnormal findings: Secondary | ICD-10-CM

## 2015-06-13 DIAGNOSIS — K644 Residual hemorrhoidal skin tags: Secondary | ICD-10-CM | POA: Diagnosis not present

## 2015-06-13 DIAGNOSIS — R7401 Elevation of levels of liver transaminase levels: Secondary | ICD-10-CM

## 2015-06-13 MED ORDER — GABAPENTIN 300 MG PO CAPS: 300.0000 mg | ORAL_CAPSULE | Freq: Two times a day (BID) | ORAL | Status: DC

## 2015-06-13 NOTE — Progress Notes (Signed)
Patient ID: Stacy Thomas, female   DOB: 18-Jan-1961, 55 y.o.   MRN: [id number]   Subjective:   Patient ID: Stacy Thomas female   DOB: 1960-11-27 55 y.o.   MRN: [id number]  HPI: [name] is a 55 y.o. with PMH- osteosarcoma, Hemorrhoids, left AKA with prosthesis, HTN and HLD. Pt is new to our clinic, former PCP- Dr Kennon Holter, pt wants to establish care here. Presented today with multiple c/o numbness in her fingers and toes, also hemmorhoids.  Pt has been having numbness on the tips of her fingers and toes- right feet, for ~3 years, progressively getting worse, with tingling and burning. She has never been diagnosed with diabetes. She also complains of hemorrhoids- chronic. She has a left AKA, and wears a prosthesis that extends up to her buttock, and rubs against her butt and irritates her hemorrhoids and cause pain. She has tried to have the prosthesis changed but could not get insurance coverage. She had an AKA- 1980s, after she was diagnosed with osteosarcaoma.  She has been stable since then, has not needed to follow up with an oncologist. She says she was not told to follow up, as her oncologist died in the lat 1980s- early 68s.  She denies current cigs use but previous smoked cigs, hardly ever drank alcohol, and denies IV drug use, but previously used to use Crack cocaine, till she quit ~7 yrs ago.  Family hx is negative for HTn, DM and cancer.  Past Medical History  Diagnosis Date  . Hypertension   . Hypercholesterolemia   . Osteosarcoma of bone ([place]) ~ 30 years ago    left leg  . Arthritis   . Osteoarthritis   . Drug abuse    Current Outpatient Prescriptions  Medication Sig Dispense Refill  . docusate sodium (COLACE) 100 MG capsule Take 1 capsule (100 mg total) by mouth every 12 (twelve) hours. 60 capsule 0  . fenofibrate (TRICOR) 48 MG tablet Take 48 mg by mouth daily.    . hydrocortisone (ANUSOL-HC) 25 MG suppository Place 1 suppository (25 mg total) rectally 2 (two)  times daily. For 7 days 14 suppository 0  . ibuprofen (ADVIL) 200 MG tablet Take 200 mg by mouth every 6 (six) hours as needed. For pain    . pravastatin (PRAVACHOL) 40 MG tablet Take 40 mg by mouth daily.    Marland Kitchen triamterene-hydrochlorothiazide (MAXZIDE-25) 37.5-25 MG per tablet Take 1 tablet by mouth daily.     No current facility-administered medications for this visit.   Family History  Problem Relation Age of Onset  . Anesthesia problems Neg Hx    Social History   Social History  . Marital Status: Married    Spouse Name: N/A  . Number of Children: N/A  . Years of Education: N/A   Social History Main Topics  . Smoking status: Former Smoker -- 1.00 packs/day for 37 years    Types: Cigarettes    Start date: 12/19/2014  . Smokeless tobacco: None  . Alcohol Use: No  . Drug Use: No     Comment: former user has been clean 7 years per pt  . Sexual Activity: Not Asked   Other Topics Concern  . None   Social History Narrative   Review of Systems: CONSTITUTIONAL- No Fever, weightloss, night sweat or change in appetite. SKIN- No Rash, colour changes or itching. HEAD- No Headache or dizziness. Mouth/throat- No Sorethroat, dentures, or bleeding gums. RESPIRATORY- No Cough or SOB. CARDIAC- No Palpitations, or  chest pain. GI- No nausea, vomiting, diarrhoea, abd pain. URINARY- No Frequency, or dysuria. NEUROLOGIC- No Numbness, syncope, seizures or burning. [facility]- Denies depression or anxiety.  Objective:  Physical Exam: Filed Vitals:   06/13/15 1436  BP: 114/78  Pulse: 101  Temp: 98 F (36.7 C)  TempSrc: Oral  Height: 5\' 1"  (1.549 m)  Weight: 152 lb (68.947 kg)  SpO2: 99%   GENERAL- alert, co-operative, appears as stated age, not in any distress. HEENT- Atraumatic, normocephalic, PERRL, neck supple. CARDIAC- RRR, no murmurs, rubs or gallops. RESP- Moving equal volumes of air, and  no wheezes or crackles. ABDOMEN- Soft, nontender,  no palpable masses or organomegaly,  bowel sounds present. LArge hemorrhoids- at 1 and 5 o clock position, does not look inflamed or thrombosis. BACK- Normal curvature of the spine, No tenderness along the vertebrae, no CVA tenderness. NEURO- No obvious Cr N abnormality, strenght upper extremities- 5/5, RLE- 5/5, Sensation intact- globally, abnormal gait, weight bearing and limping on right leg. EXTREMITIES- warm, no pedal edema, left AKA with prosthesis, extending to buttock. SKIN- Warm, dry, No rash or lesion. PSYCH- Normal mood and affect, appropriate thought content and speech.  Assessment & Plan:  The patient's case and plan of care was discussed with attending physician, Dr. Lynnae January.  Please see problem based charting for assessment and plan.

## 2015-06-13 NOTE — Patient Instructions (Addendum)
We will be doing some blood work today to find out why you are having this burning pain in your hands and feet.  Also we will start you on a new medication for your pain, called gabapentin, take on capsule two times a day.  Also we will refer you to a Gastroenterologist as you are due for a colonoscopy and they might be able to take out the hemorrhoids   It was nice seeing you today.

## 2015-06-14 ENCOUNTER — Encounter: Payer: Self-pay | Admitting: Gastroenterology

## 2015-06-14 DIAGNOSIS — G629 Polyneuropathy, unspecified: Secondary | ICD-10-CM | POA: Insufficient documentation

## 2015-06-14 DIAGNOSIS — S78119A Complete traumatic amputation at level between unspecified hip and knee, initial encounter: Secondary | ICD-10-CM | POA: Insufficient documentation

## 2015-06-14 DIAGNOSIS — K643 Fourth degree hemorrhoids: Secondary | ICD-10-CM

## 2015-06-14 DIAGNOSIS — Z85830 Personal history of malignant neoplasm of bone: Secondary | ICD-10-CM | POA: Insufficient documentation

## 2015-06-14 DIAGNOSIS — R740 Nonspecific elevation of levels of transaminase and lactic acid dehydrogenase [LDH]: Secondary | ICD-10-CM

## 2015-06-14 DIAGNOSIS — R7401 Elevation of levels of liver transaminase levels: Secondary | ICD-10-CM | POA: Insufficient documentation

## 2015-06-14 HISTORY — DX: Fourth degree hemorrhoids: K64.3

## 2015-06-14 LAB — HIV ANTIBODY (ROUTINE TESTING W REFLEX): HIV Screen 4th Generation wRfx: NONREACTIVE

## 2015-06-14 LAB — CMP14 + ANION GAP
A/G RATIO: 1.1 — AB (ref 1.2–2.2)
ALK PHOS: 109 IU/L (ref 39–117)
ALT: 108 IU/L — ABNORMAL HIGH (ref 0–32)
AST: 148 IU/L — AB (ref 0–40)
Albumin: 3.9 g/dL (ref 3.5–5.5)
Anion Gap: 19 mmol/L — ABNORMAL HIGH (ref 10.0–18.0)
BUN/Creatinine Ratio: 14 (ref 9–23)
BUN: 14 mg/dL (ref 6–24)
Bilirubin Total: 0.6 mg/dL (ref 0.0–1.2)
CALCIUM: 9.6 mg/dL (ref 8.7–10.2)
CO2: 20 mmol/L (ref 18–29)
Chloride: 104 mmol/L (ref 96–106)
Creatinine, Ser: 0.98 mg/dL (ref 0.57–1.00)
GFR calc Af Amer: 75 mL/min/{1.73_m2} (ref >59)
GFR calc non Af Amer: 65 mL/min/{1.73_m2} (ref >59)
GLUCOSE: 96 mg/dL (ref 65–99)
Globulin, Total: 3.6 g/dL (ref 1.5–4.5)
POTASSIUM: 4.1 mmol/L (ref 3.5–5.2)
Sodium: 143 mmol/L (ref 134–144)
Total Protein: 7.5 g/dL (ref 6.0–8.5)

## 2015-06-14 LAB — HEPATITIS PANEL, ACUTE
HEP A IGM: NEGATIVE
HEP B S AG: NEGATIVE
Hep B C IgM: NEGATIVE
Hep C Virus Ab: >11 s/co ratio — ABNORMAL HIGH (ref 0.0–0.9)

## 2015-06-14 LAB — CBC
Hematocrit: 43.1 % (ref 34.0–46.6)
Hemoglobin: 14.8 g/dL (ref 11.1–15.9)
MCH: 31.4 pg (ref 26.6–33.0)
MCHC: 34.3 g/dL (ref 31.5–35.7)
MCV: 91 fL (ref 79–97)
PLATELETS: 169 10*3/uL (ref 150–379)
RBC: 4.72 x10E6/uL (ref 3.77–5.28)
RDW: 13.6 % (ref 12.3–15.4)
WBC: 4.9 10*3/uL (ref 3.4–10.8)

## 2015-06-14 LAB — VITAMIN B12: VITAMIN B 12: 914 pg/mL (ref 211–946)

## 2015-06-14 NOTE — Assessment & Plan Note (Signed)
Pt liver enzymes with elevated AST And ALT, persistently. AST >ALT, but not in alcoholic ratio/pattern. AST- 92, ALT- 64- 01/2015. No prior screen for hepatitis in epic. No abdominal tenderness.  Plan- Cmet today. - Acute hepatitis panel.  Addendum- hep C antibody- Positive- >11, will add on Hep C RNA Viral load. If positive consider liver imaging and genotyping, and subsequent refferal to ID.

## 2015-06-14 NOTE — Assessment & Plan Note (Addendum)
With numbness, tingling and burning in her fingers and right feet- ~3 yrs. She is not diabetic. She denies significant alcohol use hx. Differentials broad- Vitamin deficiencys- B12, Thyroid dx, alcohol, Virus- HIV, hepatitis, heavy metals- lead, amyloidosis,and autoimmune dx.  Plan- Will screen for common things first- Vit B12  - Lab work from pts former PCP- 8/17, TSH-2 - HIV - hepatitis C. - Trial of gabapentin 300mg  BID, can increase dose as tolerated.  Addendum- hep C antibody- Positive, will add on RNA Viral load. If positive, might be possible cause of pts neuropathy, will recommend further evaluation and treating Hep C first and if persistent consider further work up.

## 2015-06-14 NOTE — Assessment & Plan Note (Signed)
External hemorrhoids. She saw a surgeon about excising them, but her co-pay was over a $1000, so she did not follow up. Most likely due to constipation. Doubt relation to possible liver dx considering transamitis, as she does not appear cirrhotic, and albumin- 3.9.   Plan- Refer to GI, she is due for a colonoscopy anyway. They might be able to do band ligation as an office procedure.

## 2015-06-15 ENCOUNTER — Encounter: Payer: Self-pay | Admitting: Internal Medicine

## 2015-06-15 DIAGNOSIS — B182 Chronic viral hepatitis C: Secondary | ICD-10-CM | POA: Insufficient documentation

## 2015-06-15 LAB — HCV RNA QUANT
HCV log10: 6.127 log10 IU/mL
HEPATITIS C QUANTITATION: 1340000 [IU]/mL

## 2015-06-15 LAB — SPECIMEN STATUS REPORT

## 2015-06-15 NOTE — Assessment & Plan Note (Signed)
With transaminitis on CMET. Pt not aware of diagnosis. Discovered on blood work for peripheral neuropathy and part of routine screening. Viral oad of 1.72million. CMEt shows slight increase in LFTs.  Plan- pt needs to be told her diagnosis and also the plan and reassured that there is treatment. - Called patient and told her we will like to discuss some of her results, told her not to worry, not an emergency but to come in at her earliest convenience. She is aware we did a HIV test too and so I told her that was Negative.  - Will need further blood work and imaging, and also referral to ID.

## 2015-06-15 NOTE — Progress Notes (Signed)
Internal Medicine Clinic Attending  Case discussed with Dr. Emokpae soon after the resident saw the patient.  We reviewed the resident's history and exam and pertinent patient test results.  I agree with the assessment, diagnosis, and plan of care documented in the resident's note. 

## 2015-06-17 ENCOUNTER — Other Ambulatory Visit: Payer: Self-pay

## 2015-06-17 DIAGNOSIS — Z1231 Encounter for screening mammogram for malignant neoplasm of breast: Secondary | ICD-10-CM

## 2015-06-20 ENCOUNTER — Encounter: Payer: Self-pay | Admitting: Internal Medicine

## 2015-06-20 ENCOUNTER — Ambulatory Visit (INDEPENDENT_AMBULATORY_CARE_PROVIDER_SITE_OTHER): Payer: Commercial Managed Care - HMO | Admitting: Internal Medicine

## 2015-06-20 VITALS — BP 119/75 | HR 86 | Temp 97.9°F | Resp 18 | Ht 61.0 in | Wt 152.3 lb

## 2015-06-20 DIAGNOSIS — B182 Chronic viral hepatitis C: Secondary | ICD-10-CM

## 2015-06-20 DIAGNOSIS — R632 Polyphagia: Secondary | ICD-10-CM | POA: Diagnosis not present

## 2015-06-20 DIAGNOSIS — Z89612 Acquired absence of left leg above knee: Secondary | ICD-10-CM

## 2015-06-20 DIAGNOSIS — F17211 Nicotine dependence, cigarettes, in remission: Secondary | ICD-10-CM

## 2015-06-20 DIAGNOSIS — S78112A Complete traumatic amputation at level between left hip and knee, initial encounter: Secondary | ICD-10-CM

## 2015-06-20 NOTE — Patient Instructions (Signed)
General Instructions:   Please bring your medicines with you each time you come to clinic.  Medicines may include prescription medications, over-the-counter medications, herbal remedies, eye drops, vitamins, or other pills.   Progress Toward Treatment Goals:  No flowsheet data found.  Self Care Goals & Plans:  No flowsheet data found.  No flowsheet data found.   Care Management & Community Referrals:  No flowsheet data found.      

## 2015-06-21 LAB — TSH: TSH: 1.99 u[IU]/mL (ref 0.450–4.500)

## 2015-06-21 NOTE — Assessment & Plan Note (Signed)
A: Appetite increase  P: Suspect this is due to smoking cessation, advised chewing gum or ice Check TSH >>>normal

## 2015-06-21 NOTE — Progress Notes (Signed)
Internal Medicine Clinic Attending  Case discussed with Dr. Hoffman soon after the resident saw the patient.  We reviewed the resident's history and exam and pertinent patient test results.  I agree with the assessment, diagnosis, and plan of care documented in the resident's note. 

## 2015-06-21 NOTE — Assessment & Plan Note (Signed)
A: Chronic hepatitis C, HIV negative  P: Check Genotype Referral to ID for consideration of treatment.

## 2015-06-21 NOTE — Progress Notes (Addendum)
[facility] Subjective:   Patient ID: Stacy Thomas female   DOB: 1960-06-26 55 y.o.   MRN: [id number]  HPI: [name] is a 56 y.o. female with a PMH detailed below who presents for follow up lab results.  She was recently seen in the [facility] for peripheral neuropathy, routine labs showed elevated LFTS and follow up test shows chronic hepatitis C infection.  I discussed this with the patient.  She actually already knew she had hepatitis C and was treated some years ago, she did not realize that it was not cured.  She does report that she was an IV drug abuser in the 1980s and did receive blood transfusions in the 80s and 90s.   She reports that overall she is feeling well today except that she has an increased appetite.  This has been going on for about 6 months since she stopped smoking.    Past Medical History  Diagnosis Date  . Hypertension   . Hypercholesterolemia   . Osteosarcoma of bone ([place]) ~ 30 years ago    left leg  . Arthritis   . Osteoarthritis   . Drug abuse    Current Outpatient Prescriptions  Medication Sig Dispense Refill  . docusate sodium (COLACE) 100 MG capsule Take 1 capsule (100 mg total) by mouth every 12 (twelve) hours. 60 capsule 0  . fenofibrate (TRICOR) 48 MG tablet Take 48 mg by mouth daily.    Marland Kitchen gabapentin (NEURONTIN) 300 MG capsule Take 1 capsule (300 mg total) by mouth 2 (two) times daily. 60 capsule 1  . hydrocortisone (ANUSOL-HC) 25 MG suppository Place 1 suppository (25 mg total) rectally 2 (two) times daily. For 7 days 14 suppository 0  . pravastatin (PRAVACHOL) 40 MG tablet Take 40 mg by mouth daily.    Marland Kitchen triamterene-hydrochlorothiazide (MAXZIDE-25) 37.5-25 MG per tablet Take 1 tablet by mouth daily.     No current facility-administered medications for this visit.   Family History  Problem Relation Age of Onset  . Anesthesia problems Neg Hx    Social History   Social History  . Marital Status: Married    Spouse  Name: N/A  . Number of Children: N/A  . Years of Education: N/A   Social History Main Topics  . Smoking status: Former Smoker -- 1.00 packs/day for 37 years    Types: Cigarettes    Start date: 12/19/2014  . Smokeless tobacco: None  . Alcohol Use: No  . Drug Use: No     Comment: former user has been clean 7 years per pt  . Sexual Activity: Not Asked   Other Topics Concern  . None   Social History Narrative   Review of Systems: Review of Systems  Constitutional: Negative for fever.  Respiratory: Negative for cough.   Cardiovascular: Negative for chest pain.  Gastrointestinal: Negative for abdominal pain.  Musculoskeletal: Negative for myalgias.  Neurological: Negative for headaches.  Psychiatric/Behavioral: Negative for depression.     Objective:  Physical Exam: Filed Vitals:   06/20/15 1028  BP: 119/75  Pulse: 86  Temp: 97.9 F (36.6 C)  TempSrc: Oral  Resp: 18  Height: 5\' 1"  (1.549 m)  Weight: 152 lb 4.8 oz (69.083 kg)  SpO2: 97%  Physical Exam  Constitutional: She is well-developed, well-nourished, and in no distress.  Cardiovascular: Normal rate and regular rhythm.   Pulmonary/Chest: Effort normal and breath sounds normal.  Abdominal: Soft. Bowel sounds are normal. There is no tenderness.  Musculoskeletal:  She exhibits no edema.  Left aka  Skin:  Lateral area of stump with hyperpigmented thicken area of skin, no ulceration  Nursing note and vitals reviewed.   Assessment & Plan:  Case discussed with Dr. Lynnae January  Appetite increase A: Appetite increase  P: Suspect this is due to smoking cessation, advised chewing gum or ice Check TSH >>>normal  Chronic hepatitis C (HCC) A: Chronic hepatitis C, HIV negative  P: Check Genotype Referral to ID for consideration of treatment.   Unilateral AKA ([place])- left HPI: She feels that her prosthesis no longer fits well and would like for it to be reevaluated.  A: Left AKA with prosthesis  P:  Will give  referral for evaluation of new prostesis    Medications Ordered No orders of the defined types were placed in this encounter.   Other Orders Orders Placed This Encounter  Procedures  . TSH  . Hepatitis C Genotype  . Ambulatory referral to Infectious Disease    Referral Priority:  Routine    Referral Type:  Consultation    Referral Reason:  Specialty Services Required    Requested Specialty:  Infectious Diseases    Number of Visits Requested:  1   Follow Up: Return in about 2 months (around 08/20/2015).

## 2015-06-22 ENCOUNTER — Telehealth: Payer: Self-pay | Admitting: Internal Medicine

## 2015-06-22 NOTE — Telephone Encounter (Signed)
Pt requesting lab result. Please call back.  °

## 2015-06-22 NOTE — Assessment & Plan Note (Signed)
HPI: She feels that her prosthesis no longer fits well and would like for it to be reevaluated.  A: Left AKA with prosthesis  P:  Will give referral for evaluation of new prostesis

## 2015-06-23 LAB — HEPATITIS C GENOTYPE

## 2015-06-23 NOTE — Telephone Encounter (Signed)
Called and went over the results. Still waiting for appt with ID clinic.

## 2015-06-24 ENCOUNTER — Telehealth: Payer: Self-pay | Admitting: Internal Medicine

## 2015-06-24 NOTE — Telephone Encounter (Signed)
Requesting the nurse to call back.

## 2015-06-24 NOTE — Telephone Encounter (Signed)
No answer, unable to leave a message. °

## 2015-06-27 ENCOUNTER — Other Ambulatory Visit: Payer: Self-pay

## 2015-06-27 MED ORDER — TRIAMTERENE-HCTZ 37.5-25 MG PO TABS: 1.0000 | ORAL_TABLET | Freq: Every day | ORAL | Status: DC

## 2015-06-27 NOTE — Telephone Encounter (Signed)
Spoke with patient, she had questions about her [facility] referral for Hep C diagnosis, advised they will call to schedule her but gave her their number as well.  Patient complaining of pain in her knee from her prosthesis, states she has always gotten injections and it is really hurting her again, she declined an appointment at this time.  I have advised her that she can come in to discuss option of injection with her PCP.  She will call back.

## 2015-06-28 ENCOUNTER — Encounter: Payer: Self-pay | Admitting: Internal Medicine

## 2015-06-28 ENCOUNTER — Ambulatory Visit (INDEPENDENT_AMBULATORY_CARE_PROVIDER_SITE_OTHER): Payer: Commercial Managed Care - HMO | Admitting: Internal Medicine

## 2015-06-28 VITALS — BP 121/75 | HR 97 | Temp 98.0°F | Ht 61.0 in | Wt 150.0 lb

## 2015-06-28 DIAGNOSIS — K643 Fourth degree hemorrhoids: Secondary | ICD-10-CM

## 2015-06-28 LAB — CBC
HCT: 43.3 % (ref 36.0–46.0)
Hemoglobin: 14.4 g/dL (ref 12.0–15.0)
MCH: 30.8 pg (ref 26.0–34.0)
MCHC: 33.3 g/dL (ref 30.0–36.0)
MCV: 92.7 fL (ref 78.0–100.0)
PLATELETS: 162 10*3/uL (ref 150–400)
RBC: 4.67 MIL/uL (ref 3.87–5.11)
RDW: 13 % (ref 11.5–15.5)
WBC: 5.8 10*3/uL (ref 4.0–10.5)

## 2015-06-28 MED ORDER — HYDROCORTISONE ACETATE 25 MG RE SUPP: 25.0000 mg | Freq: Two times a day (BID) | RECTAL | Status: DC

## 2015-06-28 NOTE — Assessment & Plan Note (Addendum)
Patient continues to complain of hemorrhoidal pain and bleeding.  She reports seeing blood in the toilet this morning and passing clots per rectum from the bleeding.  She is using dollar store version of Preparation H BID.  She finished a course of OTC suppositories last Sunday.  She has not been using fiber or laxative supplements.  She endorses fatigue, lightheadedness, dizziness, HAs, SOB, CP, bleeding gums, and itchy teeth.   She reports her hemorrhoids are unable to be reduced back into the rectum.  Orthostatics negative.  A/P: Stage IV hemorrhoids.  Patient will likely need hemorrhoidectomy, but I understand it may be prohibitively expensive.  At worst, she will need to f/u with GI for possible banding.  Until then, will recommend continuation of Preparation H, Hydrocortisone suppositories, and fiber supplement. - Preparation H with Phenylephrine - Hydrocortisone Suppositories 25 mg BID x14 days - Benefiber 1 Tbs BID

## 2015-06-28 NOTE — Patient Instructions (Signed)
1. Use Preparation H with PHENYLEPHRINE up to 6 times daily to rectum and hemorrhoids. 2. Use Benefiber 1 Tbs twice daily to soften stools. 3. Use Hydrocortisone 25 mg suppositories twice daily for 2 weeks.  Hemorrhoids Hemorrhoids are swollen veins around the rectum or anus. There are two types of hemorrhoids:   Internal hemorrhoids. These occur in the veins just inside the rectum. They may poke through to the outside and become irritated and painful.  External hemorrhoids. These occur in the veins outside the anus and can be felt as a painful swelling or hard lump near the anus. CAUSES  Pregnancy.   Obesity.   Constipation or diarrhea.   Straining to have a bowel movement.   Sitting for long periods on the toilet.  Heavy lifting or other activity that caused you to strain.  Anal intercourse. SYMPTOMS   Pain.   Anal itching or irritation.   Rectal bleeding.   Fecal leakage.   Anal swelling.   One or more lumps around the anus.  DIAGNOSIS  Your caregiver may be able to diagnose hemorrhoids by visual examination. Other examinations or tests that may be performed include:   Examination of the rectal area with a gloved hand (digital rectal exam).   Examination of anal canal using a small tube (scope).   A blood test if you have lost a significant amount of blood.  A test to look inside the colon (sigmoidoscopy or colonoscopy). TREATMENT Most hemorrhoids can be treated at home. However, if symptoms do not seem to be getting better or if you have a lot of rectal bleeding, your caregiver may perform a procedure to help make the hemorrhoids get smaller or remove them completely. Possible treatments include:   Placing a rubber band at the base of the hemorrhoid to cut off the circulation (rubber band ligation).   Injecting a chemical to shrink the hemorrhoid (sclerotherapy).   Using a tool to burn the hemorrhoid (infrared light therapy).   Surgically  removing the hemorrhoid (hemorrhoidectomy).   Stapling the hemorrhoid to block blood flow to the tissue (hemorrhoid stapling).  HOME CARE INSTRUCTIONS   Eat foods with fiber, such as whole grains, beans, nuts, fruits, and vegetables. Ask your doctor about taking products with added fiber in them (fibersupplements).  Increase fluid intake. Drink enough water and fluids to keep your urine clear or pale yellow.   Exercise regularly.   Go to the bathroom when you have the urge to have a bowel movement. Do not wait.   Avoid straining to have bowel movements.   Keep the anal area dry and clean. Use wet toilet paper or moist towelettes after a bowel movement.   Medicated creams and suppositories may be used or applied as directed.   Only take over-the-counter or prescription medicines as directed by your caregiver.   Take warm sitz baths for 15-20 minutes, 3-4 times a day to ease pain and discomfort.   Place ice packs on the hemorrhoids if they are tender and swollen. Using ice packs between sitz baths may be helpful.   Put ice in a plastic bag.   Place a towel between your skin and the bag.   Leave the ice on for 15-20 minutes, 3-4 times a day.   Do not use a donut-shaped pillow or sit on the toilet for long periods. This increases blood pooling and pain.  SEEK MEDICAL CARE IF:  You have increasing pain and swelling that is not controlled by treatment or medicine.  You have uncontrolled bleeding.  You have difficulty or you are unable to have a bowel movement.  You have pain or inflammation outside the area of the hemorrhoids. MAKE SURE YOU:  Understand these instructions.  Will watch your condition.  Will get help right away if you are not doing well or get worse.   This information is not intended to replace advice given to you by your health care provider. Make sure you discuss any questions you have with your health care provider.   Document Released:  03/16/2000 Document Revised: 03/05/2012 Document Reviewed: 01/22/2012 Elsevier Interactive Patient Education [employer].

## 2015-06-28 NOTE — Progress Notes (Signed)
Patient ID: REAM ALLEYNE, female   DOB: 10/09/1960, 55 y.o.   MRN: [id number]   Subjective:   Patient ID: SUMER BRILLHART female   DOB: [birth year] 55 y.o.   MRN: [id number]  HPI: Ms.Mazi DONIE CALTON is a 55 y.o. female with PMH as below, here for f/u hemorrhoids.  Please see Problem-Based charting for the status of the patient's chronic medical issues.     Past Medical History  Diagnosis Date  . Hypertension   . Hypercholesterolemia   . Osteosarcoma of bone ([place]) ~ 30 years ago    left leg  . Arthritis   . Osteoarthritis   . Drug abuse    Current Outpatient Prescriptions  Medication Sig Dispense Refill  . docusate sodium (COLACE) 100 MG capsule Take 1 capsule (100 mg total) by mouth every 12 (twelve) hours. 60 capsule 0  . fenofibrate (TRICOR) 48 MG tablet Take 48 mg by mouth daily.    Marland Kitchen gabapentin (NEURONTIN) 300 MG capsule Take 1 capsule (300 mg total) by mouth 2 (two) times daily. 60 capsule 1  . hydrocortisone (ANUSOL-HC) 25 MG suppository Place 1 suppository (25 mg total) rectally 2 (two) times daily. For 7 days 14 suppository 0  . pravastatin (PRAVACHOL) 40 MG tablet Take 40 mg by mouth daily.    Marland Kitchen triamterene-hydrochlorothiazide (MAXZIDE-25) 37.5-25 MG tablet Take 1 tablet by mouth daily. 30 tablet 1   No current facility-administered medications for this visit.   Family History  Problem Relation Age of Onset  . Anesthesia problems Neg Hx    Social History   Social History  . Marital Status: Married    Spouse Name: N/A  . Number of Children: N/A  . Years of Education: N/A   Social History Main Topics  . Smoking status: Former Smoker -- 1.00 packs/day for 37 years    Types: Cigarettes    Start date: 12/19/2014  . Smokeless tobacco: None  . Alcohol Use: No  . Drug Use: No     Comment: former user has been clean 7 years per pt  . Sexual Activity: Not Asked   Other Topics Concern  . None   Social History Narrative   Review of Systems: Endorses fatigue,  HAs, lightheadedness, dizziness, SOB, CP, bleeding gums, and itchy teeth. Objective:  Physical Exam: Filed Vitals:   06/28/15 1415  BP: 121/75  Pulse: 97  Temp: 98 F (36.7 C)  TempSrc: Oral  Height: 5\' 1"  (1.549 m)  Weight: 150 lb (68.04 kg)  SpO2: 98%   Physical Exam  Constitutional: She is oriented to person, place, and time and well-developed, well-nourished, and in no distress. No distress.  Uncomfortable appearing, but NAD  HENT:  Head: Normocephalic and atraumatic.  Eyes: EOM are normal. No scleral icterus.  Neck: No tracheal deviation present.  Cardiovascular: Normal rate, regular rhythm and normal heart sounds.   Pulmonary/Chest: Effort normal. No stridor. No respiratory distress. She has no wheezes.  Abdominal: Soft. She exhibits no distension. There is no tenderness. There is no rebound and no guarding.  Genitourinary:  Large external hemorrhoids, nonengorged, nonthrombosed, no bleeding on DRE.  Neurological: She is alert and oriented to person, place, and time.  Skin: Skin is warm and dry. She is not diaphoretic.     Assessment & Plan:   Patient and case were discussed with Dr. Dareen Piano.  Please refer to Problem Based charting for further documentation.

## 2015-06-29 NOTE — Progress Notes (Signed)
Internal Medicine Clinic Attending  Case discussed with Dr. Taylor at the time of the visit.  We reviewed the resident's history and exam and pertinent patient test results.  I agree with the assessment, diagnosis, and plan of care documented in the resident's note. 

## 2015-06-30 ENCOUNTER — Ambulatory Visit: Payer: Commercial Managed Care - HMO

## 2015-07-01 ENCOUNTER — Telehealth: Payer: Self-pay | Admitting: Gastroenterology

## 2015-07-01 ENCOUNTER — Encounter: Payer: Self-pay | Admitting: *Deleted

## 2015-07-01 NOTE — Telephone Encounter (Signed)
Spoke with patient and moved her OV to 07/05/15 at 11:00 AM.

## 2015-07-05 ENCOUNTER — Encounter: Payer: Self-pay | Admitting: Gastroenterology

## 2015-07-05 ENCOUNTER — Ambulatory Visit (INDEPENDENT_AMBULATORY_CARE_PROVIDER_SITE_OTHER): Payer: Commercial Managed Care - HMO | Admitting: Gastroenterology

## 2015-07-05 ENCOUNTER — Other Ambulatory Visit: Payer: Self-pay | Admitting: Gastroenterology

## 2015-07-05 ENCOUNTER — Ambulatory Visit
Admission: RE | Admit: 2015-07-05 | Discharge: 2015-07-05 | Disposition: A | Discharge status: Home / Self Care | Payer: Commercial Managed Care - HMO | Source: Ambulatory Visit

## 2015-07-05 ENCOUNTER — Other Ambulatory Visit: Payer: Commercial Managed Care - HMO

## 2015-07-05 VITALS — BP 116/70 | HR 82 | Ht 61.0 in | Wt 153.0 lb

## 2015-07-05 DIAGNOSIS — B1920 Unspecified viral hepatitis C without hepatic coma: Secondary | ICD-10-CM | POA: Diagnosis not present

## 2015-07-05 DIAGNOSIS — K625 Hemorrhage of anus and rectum: Secondary | ICD-10-CM | POA: Diagnosis not present

## 2015-07-05 DIAGNOSIS — K643 Fourth degree hemorrhoids: Secondary | ICD-10-CM | POA: Diagnosis not present

## 2015-07-05 DIAGNOSIS — Z1231 Encounter for screening mammogram for malignant neoplasm of breast: Secondary | ICD-10-CM

## 2015-07-05 NOTE — Patient Instructions (Addendum)
You have been scheduled for your 2nd hemorrhoidal banding with Dr Havery Moros on 07/28/15 @ 11:00 am.  You have been scheduled for an abdominal ultrasound with elastography at [facility] (1st floor of hospital) on 07/20/15 at 11:00 am. Please arrive 15 minutes prior to your appointment for registration. Make certain not to have anything to eat or drink 6 hours prior to your appointment. Should you need to reschedule your appointment, please contact radiology at [phone]. This test typically takes about 30 minutes to perform.  We will send a referral to the hepatitis C clinic for you. We will contact you with an appointment once that is made available to [place].  Your physician has requested that you go to the basement for the following lab work before leaving today: Hepatitis A total, Hepatitis B surface antigen  We have given you a handout on fiber supplements. Please purchase one of these over the counter and take daily as directed.  We have given you a handout on sitzbaths to look over. Please follow these guidelines.  We will discuss colonoscopy at your next visit.  If you are age 57 or older, your body mass index should be between 23-30. Your Body mass index is 28.92 kg/(m^2). If this is out of the aforementioned range listed, please consider follow up with your Primary Care Provider.  If you are age 66 or younger, your body mass index should be between 19-25. Your Body mass index is 28.92 kg/(m^2). If this is out of the aformentioned range listed, please consider follow up with your Primary Care Provider.

## 2015-07-05 NOTE — Progress Notes (Signed)
HPI :  55 y/o female here for further evaluation of rectal bleeding and newly diagnosed hepatitis C. 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  Patient reports problems with hemorrhoids. She reports for the past year they have been bothering her. She has been seeing blood in her stools, occurs on most days, for the past year. She has usually 4 BMs per day. She sees red blood and blood clots in the toilet water, stools, and toilet paper. She reports she has perianal pain with her bowel movements and when seeing the blood. She reports passing hard stools at baseline, but sometimes soft stools since using suppository. She has straining to produce stools. She does not take anything for constipation. No weight loss, she has gained weight. No FH of colon cancer. She feels the prolapsed hemorrhoids are out all the time.  She had a colonoscopy in 2010 which showed hemorrhoids and diverticulosis, otherwise a normal exam without polyps. Recent CBC was done showing a normal Hgb. She endorses some ongoing bloating otherwise.   She otherwise states she has recently been diagnosed with hepatitis C, 2 weeks ago on routine blood work which showed she had elevated liver enzymes. She previously used IV drugs and smoked crack cocaine, this was > 10 years ago when she was last used. She has genotype 1A with ALT and AST elevated to low 100s. She has no history of cirrhosis known. She denies any alcohol use at all.   Colonoscopy 02/28/2009 - external / internal hemorrhoids, diverticulosis  Past Medical History  Diagnosis Date  . Hypertension   . Hypercholesterolemia   . Osteosarcoma of bone ([place]) ~ 30 years ago    left leg  . Arthritis   . Osteoarthritis   . Drug abuse   . Diverticulosis   . Internal hemorrhoids   . External hemorrhoid   . Esophageal stricture   . Hepatitis C      Past Surgical History  Procedure Laterality Date  . Above knee leg  amputation  ~ 30 years    left  . Breast biopsy      determine to be a cyst  . Tonsillectomy      child   Family History  Problem Relation Age of Onset  . Anesthesia problems Neg Hx    Social History  Substance Use Topics  . Smoking status: Former Smoker -- 1.00 packs/day for 37 years    Types: Cigarettes    Start date: 12/19/2014  . Smokeless tobacco: None  . Alcohol Use: No   Current Outpatient Prescriptions  Medication Sig Dispense Refill  . docusate sodium (COLACE) 100 MG capsule Take 1 capsule (100 mg total) by mouth every 12 (twelve) hours. 60 capsule 0  . fenofibrate (TRICOR) 48 MG tablet Take 48 mg by mouth daily.    Marland Kitchen gabapentin (NEURONTIN) 300 MG capsule Take 1 capsule (300 mg total) by mouth 2 (two) times daily. 60 capsule 1  . hydrocortisone (ANUSOL-HC) 25 MG suppository Place 1 suppository (25 mg total) rectally 2 (two) times daily. For 14 days 28 suppository 0  . pravastatin (PRAVACHOL) 40 MG tablet Take 40 mg by mouth daily.    Marland Kitchen triamterene-hydrochlorothiazide (MAXZIDE-25) 37.5-25 MG tablet Take 1 tablet by mouth daily. 30 tablet 1   No current facility-administered medications for this visit.   Allergies  Allergen Reactions  . Codeine Other (See Comments)    Unknown reaction  .  Morphine     REACTION: face swells     Review of Systems: All systems reviewed and negative except where noted in HPI.   Lab Results  Component Value Date   WBC 5.8 06/28/2015   HGB 14.4 06/28/2015   HCT 43.3 06/28/2015   MCV 92.7 06/28/2015   PLT 162 06/28/2015    Lab Results  Component Value Date   ALT 108* 06/13/2015   AST 148* 06/13/2015   ALKPHOS 109 06/13/2015   BILITOT 0.6 06/13/2015    Lab Results  Component Value Date   CREATININE 0.98 06/13/2015   BUN 14 06/13/2015   NA 143 06/13/2015   K 4.1 06/13/2015   CL 104 06/13/2015   CO2 20 06/13/2015     Physical Exam: BP 116/70 mmHg  Pulse 82  Ht 5\' 1"  (1.549 m)  Wt 153 lb (69.4 kg)  BMI 28.92 kg/m2   LMP 12/02/2010 Constitutional: Pleasant,well-developed, female in no acute distress, left leg prosthesis noted HEENT: Normocephalic and atraumatic. Conjunctivae are normal. No scleral icterus. Neck supple.  Cardiovascular: Normal rate, regular rhythm.  Pulmonary/chest: Effort normal and breath sounds normal. No wheezing, rales or rhonchi. Abdominal: Soft, nondistended, protuberant, nontender. Bowel sounds active throughout. There are no masses palpable. No hepatomegaly. DRE / Anoscopy: large external hemorrhoids, prolapsed, grade III/IV, internal hemorrhoids on anoscopy, most prominent in RP position, no fissure appreciated Extremities: no edema Lymphadenopathy: No cervical adenopathy noted. Neurological: Alert and oriented to person place and time. Skin: Skin is warm and dry. No rashes noted. Psychiatric: Normal mood and affect. Behavior is normal.   ASSESSMENT AND PLAN: 55 y/o female with history as outlined above presenting for rectal bleeding and newly diagnosed hepatitis C.  Rectal bleeding - almost certainly due to large hemorrhoids noted on anoscopy and DRE with inflammation. Prior colonoscopy in 2010 showed no polyps, her CBC is normal. I discussed hemorrhoid therapy with her to include medical and surgical options, as well as banding. Following our discussion today she wished to proceed with banding and the RP hemorrhoid was banded as outlined below. Otherwise, recommend Sitz baths PRN and a daily fiber supplement. I will see her hack in a few weeks for repeat banding. I offered her another colonoscopy to ensure no other source although with normal CBC and large inflamed hemorrhoids, this is almost certainly the etiology. We will discussed this again at her follow up.  Genotype 1a hepatitis C - I discussed hepatitis C with her at length to include natural history, risks of cirrhosis and HCC. She is a prior IV drug user which is where she thinks she contracted it. I recommend her  husband be tested if he has not had it. I will obtain a [place] with elastography given this diagnosis, and will test for hep A / B immunity and vaccinate if needed. Otherwise will refer to the hepatitis C / liver clinic for further therapy.   She agreed with the recommendations, all questions answered.   Surry Cellar, MD [facility] Pager [phone]  CC:  [place]enton Brick, Ejiroghene E, *    PROCEDURE NOTE: The patient presents with symptomatic grade III / IV hemorrhoids, requesting rubber band ligation of his/her hemorrhoidal disease.  All risks, benefits and alternative forms of therapy were described and informed consent was obtained.  In the Left Lateral Decubitus position anoscopic examination revealed grade III / IV hemorrhoids in all position(s).  The anorectum was pre-medicated with 0.125% nitroglycerin The decision was made to band the RP internal hemorrhoid, and  the Welling was used to perform band ligation without complication.  Digital anorectal examination was then performed to assure proper positioning of the band, and to adjust the banded tissue as required.  The patient was discharged home without pain or other issues.  Dietary and behavioral recommendations were given and along with follow-up instructions.     The following adjunctive treatments were recommended: Daily fiber supplement Sitz baths  The patient will return in 2-3 weeks for  follow-up and possible additional banding as required. No complications were encountered and the patient tolerated the procedure well.  [place] Cellar, MD [facility] Pager [phone]

## 2015-07-06 ENCOUNTER — Telehealth: Payer: Self-pay | Admitting: *Deleted

## 2015-07-06 ENCOUNTER — Other Ambulatory Visit: Payer: Self-pay | Admitting: *Deleted

## 2015-07-06 DIAGNOSIS — B1920 Unspecified viral hepatitis C without hepatic coma: Secondary | ICD-10-CM

## 2015-07-06 LAB — HEPATITIS B SURFACE ANTIGEN: HEP B S AG: NEGATIVE

## 2015-07-06 LAB — HEPATITIS B SURFACE ANTIBODY, QUANTITATIVE: Hepatitis B-Post: 0.1 m[IU]/mL

## 2015-07-06 LAB — HEPATITIS A ANTIBODY, TOTAL: HEP A TOTAL AB: REACTIVE — AB

## 2015-07-06 NOTE — Telephone Encounter (Signed)
Patient has been scheduled for an appointment with [facility] on 08/03/15 @ 1:30 pm. I have spoken to patient and she has already been made aware of appointment.

## 2015-07-07 ENCOUNTER — Other Ambulatory Visit: Payer: Commercial Managed Care - HMO

## 2015-07-07 DIAGNOSIS — B182 Chronic viral hepatitis C: Secondary | ICD-10-CM

## 2015-07-07 LAB — PROTIME-INR
INR: 1.02 (ref <1.50)
PROTHROMBIN TIME: 13.5 s (ref 11.6–15.2)

## 2015-07-07 LAB — IRON: IRON: 173 ug/dL — AB (ref 45–160)

## 2015-07-07 LAB — HIV ANTIBODY (ROUTINE TESTING W REFLEX): HIV: NONREACTIVE

## 2015-07-07 LAB — HEPATITIS B CORE ANTIBODY, TOTAL: Hep B Core Total Ab: REACTIVE — AB

## 2015-07-08 ENCOUNTER — Other Ambulatory Visit: Payer: Self-pay | Admitting: *Deleted

## 2015-07-08 DIAGNOSIS — B1920 Unspecified viral hepatitis C without hepatic coma: Secondary | ICD-10-CM

## 2015-07-08 LAB — ANA: Anti Nuclear Antibody(ANA): NEGATIVE

## 2015-07-12 ENCOUNTER — Other Ambulatory Visit: Payer: Self-pay | Admitting: Internal Medicine

## 2015-07-19 ENCOUNTER — Other Ambulatory Visit: Payer: Self-pay | Admitting: Internal Medicine

## 2015-07-19 MED ORDER — PRAVASTATIN SODIUM 40 MG PO TABS: 40.0000 mg | ORAL_TABLET | Freq: Every day | ORAL | Status: DC

## 2015-07-19 NOTE — Telephone Encounter (Signed)
Pravastatin 20 mg [facility]

## 2015-07-20 ENCOUNTER — Ambulatory Visit (HOSPITAL_COMMUNITY)
Admission: RE | Admit: 2015-07-20 | Discharge: 2015-07-20 | Disposition: A | Discharge status: Home / Self Care | Payer: Commercial Managed Care - HMO | Source: Ambulatory Visit | Attending: Gastroenterology | Admitting: Gastroenterology

## 2015-07-20 DIAGNOSIS — B1920 Unspecified viral hepatitis C without hepatic coma: Secondary | ICD-10-CM | POA: Insufficient documentation

## 2015-07-21 ENCOUNTER — Telehealth: Payer: Self-pay | Admitting: Gastroenterology

## 2015-07-21 NOTE — Telephone Encounter (Signed)
Patient is calling for results. Please, advise. 

## 2015-07-21 NOTE — Telephone Encounter (Signed)
I called the patient back and discussed results. See result note for details

## 2015-07-28 ENCOUNTER — Ambulatory Visit (INDEPENDENT_AMBULATORY_CARE_PROVIDER_SITE_OTHER): Payer: Commercial Managed Care - HMO | Admitting: Gastroenterology

## 2015-07-28 ENCOUNTER — Encounter: Payer: Self-pay | Admitting: Gastroenterology

## 2015-07-28 VITALS — BP 112/76 | HR 80 | Ht 61.0 in | Wt 153.0 lb

## 2015-07-28 DIAGNOSIS — B1920 Unspecified viral hepatitis C without hepatic coma: Secondary | ICD-10-CM | POA: Diagnosis not present

## 2015-07-28 DIAGNOSIS — K6289 Other specified diseases of anus and rectum: Secondary | ICD-10-CM

## 2015-07-28 DIAGNOSIS — Z23 Encounter for immunization: Secondary | ICD-10-CM | POA: Diagnosis not present

## 2015-07-28 DIAGNOSIS — K643 Fourth degree hemorrhoids: Secondary | ICD-10-CM | POA: Diagnosis not present

## 2015-07-28 DIAGNOSIS — B182 Chronic viral hepatitis C: Secondary | ICD-10-CM | POA: Diagnosis not present

## 2015-07-28 MED ORDER — AMBULATORY NON FORMULARY MEDICATION: Status: DC

## 2015-07-28 NOTE — Patient Instructions (Addendum)
We have sent a prescription of nitroglycerin to [facility] for you.  Take Sitz baths. We have given you a brochure about this.  You have been scheduled for an appointment today with [facility] at 3:00pm.  Arrive at 2:40pm.  Phone number is [phone]

## 2015-07-28 NOTE — Progress Notes (Signed)
HPI :  Patient was here for follow up for her hemorrhoids for a banding procedure. She was supposed to have a banding done but she has developed severe rectal pain which is bothering her. She continues to have some blood in her stools. No fevers.  She had several questions about hepatitis C  treatment plans which we discussed.   Past Medical History  Diagnosis Date  . Hypertension   . Hypercholesterolemia   . Osteosarcoma of bone ([place]) ~ 30 years ago    left leg  . Arthritis   . Osteoarthritis   . Drug abuse   . Diverticulosis   . Internal hemorrhoids   . External hemorrhoid   . Esophageal stricture   . Hepatitis C      Past Surgical History  Procedure Laterality Date  . Above knee leg amputation  ~ 30 years    left  . Breast biopsy      determine to be a cyst  . Tonsillectomy      child   Family History  Problem Relation Age of Onset  . Anesthesia problems Neg Hx    Social History  Substance Use Topics  . Smoking status: Former Smoker -- 1.00 packs/day for 37 years    Types: Cigarettes    Start date: 12/19/2014  . Smokeless tobacco: None  . Alcohol Use: No   Current Outpatient Prescriptions  Medication Sig Dispense Refill  . gabapentin (NEURONTIN) 300 MG capsule Take 1 capsule (300 mg total) by mouth 2 (two) times daily. 60 capsule 1  . hydrocortisone (ANUSOL-HC) 25 MG suppository Place 1 suppository (25 mg total) rectally 2 (two) times daily. For 14 days 28 suppository 0  . pravastatin (PRAVACHOL) 40 MG tablet Take 1 tablet (40 mg total) by mouth daily. 30 tablet 2  . triamterene-hydrochlorothiazide (MAXZIDE-25) 37.5-25 MG tablet Take 1 tablet by mouth daily. 30 tablet 1   No current facility-administered medications for this visit.   Allergies  Allergen Reactions  . Codeine Other (See Comments)    Unknown reaction  . Morphine     REACTION: face swells     Review of Systems: All systems reviewed and negative except where noted in HPI.    Mm  Digital Screening Bilateral  07/05/2015  CLINICAL DATA:  Screening. EXAM: DIGITAL SCREENING BILATERAL MAMMOGRAM WITH CAD COMPARISON:  Previous exam(s). ACR Breast Density Category b: There are scattered areas of fibroglandular density. FINDINGS: There are no findings suspicious for malignancy. Images were processed with CAD. IMPRESSION: No mammographic evidence of malignancy. A result letter of this screening mammogram will be mailed directly to the patient. RECOMMENDATION: Screening mammogram in one year. (Code:SM-B-01Y) BI-RADS CATEGORY  1: Negative. Electronically Signed   By: Lajean Manes M.D.   On: 07/05/2015 14:16   US Abdomen Complete W/elastography  07/20/2015  CLINICAL DATA:  Newly diagnosed hepatitis-C viral infection of unspecified chronicity EXAM: ULTRASOUND ABDOMEN COMPLETE ULTRASOUND HEPATIC ELASTOGRAPHY TECHNIQUE: Sonography of the upper abdomen was performed. In addition, ultrasound elastography evaluation of the liver was performed. A region of interest was placed within the right lobe of the liver. Following application of a compressive sonographic pulse, shear waves were detected in the adjacent hepatic tissue and the shear wave velocity was calculated. Multiple assessments were performed at the selected site. Median shear wave velocity is correlated to a Metavir fibrosis score. COMPARISON:  None FINDINGS: ULTRASOUND ABDOMEN Gallbladder: Normally distended without stones or wall thickening. No pericholecystic fluid or sonographic Murphy sign. Common bile duct:  Diameter: Upper normal caliber 6 mm diameter Liver: Normal appearance.  Hepatopetal portal venous flow. IVC: Normal appearance Pancreas: Normal appearance Spleen: Normal appearance, 9.1 cm length Right Kidney: Length: 10.6 cm. Normal morphology without mass or hydronephrosis. Left Kidney: Length: 10.2 cm. Normal morphology without mass or hydronephrosis. Abdominal aorta: Distally obscured by bowel gas. Proximal and mid portions normal  caliber Other findings: No free fluid ULTRASOUND HEPATIC ELASTOGRAPHY Device: Siemens Helix VTQ Patient position:  Left Lateral Decubitus Transducer 6C1 Number of measurements:  10 Hepatic Segment:  8 Median velocity:   3.51  m/sec IQR: 0.29 IQR/Median velocity ratio 0.08 Corresponding Metavir fibrosis score:  Some F3 + F4 Risk of fibrosis: High Limitations of exam: None Pertinent findings noted on other imaging exams:  None Please note that abnormal shear wave velocities may also be identified in clinical settings other than with hepatic fibrosis, such as: acute hepatitis, elevated right heart and central venous pressures including use of beta blockers, veno-occlusive disease (Budd-Chiari), infiltrative processes such as mastocytosis/amyloidosis/infiltrative tumor, extrahepatic cholestasis, in the post-prandial state, and liver transplantation. Correlation with patient history, laboratory data, and clinical condition recommended. IMPRESSION: Normal abdominal ultrasound. Median hepatic shear wave velocity is calculated at 3.51 m/sec. Corresponding Metavir fibrosis score is F4 & some F3 . Risk of fibrosis is high. Follow-up:  Advised Electronically Signed   By: Lavonia Dana M.D.   On: 07/20/2015 12:11    Physical Exam: BP 112/76 mmHg  Pulse 80  Ht 5\' 1"  (1.549 m)  Wt 153 lb (69.4 kg)  BMI 28.92 kg/m2  LMP 12/02/2010 Constitutional: Pleasant,well-developed, female in no acute distress. DRE - large prolapsed hemorrhoids, with suspected thrombosis in the right side. Unable to assess for fissure due to edema. No abscess or purulence  ASSESSMENT AND PLAN: 55 y/o female with history of hep C here for follow up for hemorrhoid banding. Since I have last seen her she developed severe rectal pain. She has large prolapsed hemorrhoids on exam, on the right lateral side with engorgement and extreme tenderness to palpation. I cannot tell if she has a fissure contributing to her symptoms. I suspect she may have a  thrombosed hemorrhoid +/- a fissure. Will recommend topical nitroglycerin ointment 0.125% to treat possible fissure, and she should continue Sitz baths, but ultimately recommend a surgical consultation at this time given the severity of her hemorrhoids with possible thrombosis, and will hold off on further banding today in this light.   I otherwise answered her questions about hepatitis C, she is seeing the liver clinic to follow up this issue. She is not immune to hepatitis B and will receive this vaccine today as well.   Cellar, MD [facility] Pager [phone]

## 2015-08-01 ENCOUNTER — Encounter: Payer: Commercial Managed Care - HMO | Admitting: Internal Medicine

## 2015-08-01 ENCOUNTER — Telehealth: Payer: Self-pay | Admitting: Gastroenterology

## 2015-08-01 NOTE — Telephone Encounter (Signed)
Spoke with patient and per Nicoletta Ba, PA only needs to see one of the specialists. Patient aware.

## 2015-08-03 ENCOUNTER — Encounter: Payer: Self-pay | Admitting: Internal Medicine

## 2015-08-03 ENCOUNTER — Ambulatory Visit (INDEPENDENT_AMBULATORY_CARE_PROVIDER_SITE_OTHER): Payer: Commercial Managed Care - HMO | Admitting: Internal Medicine

## 2015-08-03 ENCOUNTER — Ambulatory Visit: Payer: Commercial Managed Care - HMO | Admitting: Gastroenterology

## 2015-08-03 VITALS — BP 116/74 | HR 103 | Temp 98.0°F | Wt 156.0 lb

## 2015-08-03 DIAGNOSIS — B182 Chronic viral hepatitis C: Secondary | ICD-10-CM

## 2015-08-03 MED ORDER — LEDIPASVIR-SOFOSBUVIR 90-400 MG PO TABS: 1.0000 | ORAL_TABLET | Freq: Every day | ORAL | Status: DC

## 2015-08-03 NOTE — Progress Notes (Signed)
[facility]   CC: consideration for treatment for chronic hepatitis C  HPI:  +Stacy Thomas is a 55 y.o. female who presents for initial evaluation and management of chronic hepatitis C.  Patient tested positive earlier this year during screening for hepatitis C. Hepatitis C-associated risk factors present are: IV drug abuse (details: over 30 years ago). Patient denies history of blood transfusion, renal dialysis, sexual contact with person with liver disease, tattoos. Patient has had other studies performed. Results: hepatitis C RNA by PCR, result: positive. Patient has not had prior treatment for Hepatitis C. Patient does not have a past history of liver disease. Patient does not have a family history of liver disease. Patient does not  have associated signs or symptoms related to liver disease.  Labs reviewed and confirm chronic hepatitis C with a positive viral load.   Records reviewed from PCP, has had elastography and is F3/4.  Has seen Dr. Havery Moros for GI    Patient does have documented immunity to Hepatitis A. Patient does have documented immunity to Hepatitis B.    Review of Systems:   Constitutional: negative for fatigue and malaise Gastrointestinal: negative for diarrhea Musculoskeletal: negative for myalgias and arthralgias All other systems reviewed and are negative      Past Medical History  Diagnosis Date  . Hypertension   . Hypercholesterolemia   . Osteosarcoma of bone ([place]) ~ 30 years ago    left leg  . Arthritis   . Osteoarthritis   . Drug abuse   . Diverticulosis   . Internal hemorrhoids   . External hemorrhoid   . Esophageal stricture   . Hepatitis C     Prior to Admission medications   Medication Sig Start Date End Date Taking? Authorizing Provider  AMBULATORY NON FORMULARY MEDICATION Medication Name: Nitroglycerin 0.125 % apply to rectum three times daily x 6-8 weeks 07/28/15  Yes Manus Gunning, MD  gabapentin  (NEURONTIN) 300 MG capsule Take 1 capsule (300 mg total) by mouth 2 (two) times daily. 06/13/15  Yes Ejiroghene Arlyce Dice, MD  hydrocortisone (ANUSOL-HC) 25 MG suppository Place 1 suppository (25 mg total) rectally 2 (two) times daily. For 14 days 06/28/15  Yes Iline Oven, MD  pravastatin (PRAVACHOL) 40 MG tablet Take 1 tablet (40 mg total) by mouth daily. 07/19/15  Yes Tasrif Ahmed, MD  triamterene-hydrochlorothiazide (MAXZIDE-25) 37.5-25 MG tablet Take 1 tablet by mouth daily. 06/27/15  Yes Tasrif Ahmed, MD  Ledipasvir-Sofosbuvir (HARVONI) 90-400 MG TABS Take 1 tablet by mouth daily. 08/03/15   Thayer Headings, MD    Allergies  Allergen Reactions  . Codeine Other (See Comments)    Unknown reaction  . Morphine     REACTION: face swells    Social History  Substance Use Topics  . Smoking status: Former Smoker -- 1.00 packs/day for 37 years    Types: Cigarettes    Start date: 12/19/2014  . Smokeless tobacco: None  . Alcohol Use: No    Family History  Problem Relation Age of Onset  . Anesthesia problems Neg Hx   no cirrhosis   Objective:  Constitutional: in no apparent distress and alert,  Filed Vitals:   08/03/15 1359  BP: 116/74  Pulse: 103  Temp: 98 F (36.7 C)   Eyes: anicteric Cardiovascular: Cor RRR Respiratory: CTA B; normal respiratory effort Gastrointestinal: Bowel sounds are normal, liver is not enlarged, spleen is not enlarged Musculoskeletal: no pedal edema noted Skin: negatives: no  rash; no porphyria cutanea tarda Lymphatic: no cervical lymphadenopathy   Laboratory Genotype: No results found for: HCVGENOTYPE HCV viral load: No results found for: HCVQUANT Lab Results  Component Value Date   WBC 5.8 06/28/2015   HGB 14.4 06/28/2015   HCT 43.3 06/28/2015   MCV 92.7 06/28/2015   PLT 162 06/28/2015    Lab Results  Component Value Date   CREATININE 0.98 06/13/2015   BUN 14 06/13/2015   NA 143 06/13/2015   K 4.1 06/13/2015   CL 104 06/13/2015   CO2  20 06/13/2015    Lab Results  Component Value Date   ALT 108* 06/13/2015   AST 148* 06/13/2015   ALKPHOS 109 06/13/2015     Labs and history reviewed and show CHILD-PUGH A  5-6 points: Child class A 7-9 points: Child class B 10-15 points: Child class C  Lab Results  Component Value Date   INR 1.02 07/07/2015   BILITOT 0.6 06/13/2015   ALBUMIN 3.9 06/13/2015     Assessment: New Patient with Chronic Hepatitis C genotype 1a, untreated.  I discussed with the patient the lab findings that confirm chronic hepatitis C as well as the natural history and progression of disease including about 30% of people who develop cirrhosis of the liver if left untreated and once cirrhosis is established there is a 2-7% risk per year of liver cancer and liver failure.  I discussed the importance of treatment and benefits in reducing the risk, even if significant liver fibrosis exists.   Plan: 1) Patient counseled extensively on limiting acetaminophen to no more than 2 grams daily, avoidance of alcohol. 2) Transmission discussed with patient including sexual transmission, sharing razors and toothbrush.   3) Will need referral to gastroenterology if concern for cirrhosis 4) Will need referral for substance abuse counseling: No.; Further work up to include urine drug screen  No. 5) Will prescribe Harvoni for 12 weeks 6) Hepatitis A vaccine No. 7) Hepatitis B vaccine No. 8) Pneumovax vaccine will be given next visit.  9) F3/4 so will do [place] screening every 6 months. Will likely need EGD by Dr. Havery Moros.  10) will follow up after starting medication 11) will discuss with PCP about holding pravastatin

## 2015-08-03 NOTE — Patient Instructions (Signed)
Date 08/03/2015  Dear Stacy Thomas, As discussed in the [facility], your hepatitis C therapy will include the following medications:          Harvoni 90mg /400mg  tablet:           Take 1 tablet by mouth once daily   Please note that ALL MEDICATIONS WILL START ON THE SAME DATE for a total of 12 weeks. ---------------------------------------------------------------- Your HCV Treatment Start Date: TBA   Your HCV genotype:  1a    Liver Fibrosis: TBD    ---------------------------------------------------------------- YOUR PHARMACY CONTACT:   [facility] and [place] Phone: [phone] Hours: Monday to Friday 7:30 am to 6:00 pm   Please always contact your pharmacy at least 3-4 business days before you run out of medications to ensure your next month's medication is ready or 1 week prior to running out if you receive it by mail.  Remember, each prescription is for 28 days. ---------------------------------------------------------------- GENERAL NOTES REGARDING YOUR HEPATITIS C MEDICATION:  SOFOSBUVIR/LEDIPASVIR (HARVONI): - Harvoni tablet is taken daily with OR without food. - The tablets are orange. - The tablets should be stored at room temperature.  - Acid reducing agents such as H2 blockers (ie. Pepcid (famotidine), Zantac (ranitidine), Tagamet (cimetidine), Axid (nizatidine) and proton pump inhibitors (ie. Prilosec (omeprazole), Protonix (pantoprazole), Nexium (esomeprazole), or Aciphex (rabeprazole)) can decrease effectiveness of Harvoni. Do not take until you have discussed with a health care provider.    -Antacids that contain magnesium and/or aluminum hydroxide (ie. Milk of Magensia, Rolaids, Gaviscon, Maalox, Mylanta, an dArthritis Pain Formula)can reduce absorption of Harvoni, so take them at least 4 hours before or after Harvoni.  -Calcium carbonate (calcium supplements or antacids such as Tums, Caltrate,  Os-Cal)needs to be taken at least 4 hours hours before or after Harvoni.  -St. John's wort or any products that contain St. John's wort like some herbal supplements  Please inform the office prior to starting any of these medications.  - The common side effects associated with Harvoni include:      1. Fatigue      2. Headache      3. Nausea      4. Diarrhea      5. Insomnia  Please note that this only lists the most common side effects and is NOT a comprehensive list of the potential side effects of these medications. For more information, please review the drug information sheets that come with your medication package from the pharmacy.  ---------------------------------------------------------------- GENERAL HELPFUL HINTS ON HCV THERAPY: 1. Stay well-hydrated. 2. Notify the [facility] of any changes in your other over-the-counter/herbal or prescription medications. 3. If you miss a dose of your medication, take the missed dose as soon as you remember. Return to your regular time/dose schedule the next day.  4.  Do not stop taking your medications without first talking with your healthcare provider. 5.  You may take Tylenol (acetaminophen), as long as the dose is less than 2000 mg (OR no more than 4 tablets of the Tylenol Extra Strengths 500mg  tablet) in 24 hours. 6.  You will see our pharmacist-specialist within the first 2 weeks of starting your medication. 7.  You will need to obtain routine labs around week 4 and12 weeks after starting and then 3 to 6 months after finishing Harvoni.    Stacy Thomas, Stacy Thomas [facility] [place],  [place]  [address] [phone]

## 2015-08-08 ENCOUNTER — Ambulatory Visit (INDEPENDENT_AMBULATORY_CARE_PROVIDER_SITE_OTHER): Payer: Commercial Managed Care - HMO | Admitting: Gastroenterology

## 2015-08-08 DIAGNOSIS — B1920 Unspecified viral hepatitis C without hepatic coma: Secondary | ICD-10-CM | POA: Diagnosis not present

## 2015-08-08 DIAGNOSIS — Z23 Encounter for immunization: Secondary | ICD-10-CM

## 2015-08-10 ENCOUNTER — Telehealth: Payer: Self-pay

## 2015-08-10 NOTE — Telephone Encounter (Signed)
-----   Message from Manus Gunning, MD sent at 08/08/2015 12:05 PM EDT ----- That's okay thanks for letting me know. I would just proceed on the schedule for her third in 6 months and then she will be done. Thanks  ----- Message -----    From: Audrea Muscat, CMA    Sent: 08/08/2015   9:37 AM      To: Manus Gunning, MD  Patient received first Hep B injection during office visit on 07/28/2015.  Her 2nd injection was inadvertently scheduled incorrectly for 08/08/2015, 2 weeks early.  I unfortunately made the mistake of giving it to her without looking back to double check it was the correct time.  The correct schedule is: day 1, day 30 +1, 6 months + 1.  Please advise what I need to do to get this back on track.  I'm sorry!  :(

## 2015-08-12 ENCOUNTER — Telehealth: Payer: Self-pay | Admitting: Gastroenterology

## 2015-08-12 ENCOUNTER — Telehealth: Payer: Self-pay

## 2015-08-12 NOTE — Telephone Encounter (Signed)
LM on VM.

## 2015-08-12 NOTE — Telephone Encounter (Signed)
Patient states that her insurance has denied covering for Hep C medication that was prescribed by Dr. Johney Maine. I informed patient that she would need to contact Dr. Clyda Greener office about that but is wanting to talk to a nurse at our office. She is requesting to speak to [place].

## 2015-08-12 NOTE — Telephone Encounter (Signed)
Stacy Thomas calling to express dismay that her insurance company denied the Harvoni prescribed for her Hep C.  I told her that I was not at all familiar with that drug but that if she called Dr. Henreitta Leber office, who prescribed it, they might be able to check into assistance programs or other resources offered by the company that makes it.  I looked up Dr. Henreitta Leber number for her and she is going to call on Monday.

## 2015-08-15 ENCOUNTER — Other Ambulatory Visit: Payer: Self-pay | Admitting: Internal Medicine

## 2015-08-15 DIAGNOSIS — G609 Hereditary and idiopathic neuropathy, unspecified: Secondary | ICD-10-CM

## 2015-08-15 MED ORDER — GABAPENTIN 300 MG PO CAPS: 300.0000 mg | ORAL_CAPSULE | Freq: Two times a day (BID) | ORAL | Status: DC

## 2015-08-15 NOTE — Telephone Encounter (Signed)
gabapentin (NEURONTIN) 300 MG capsule [facility] [address]

## 2015-08-16 ENCOUNTER — Ambulatory Visit: Payer: Self-pay | Admitting: Surgery

## 2015-08-16 ENCOUNTER — Other Ambulatory Visit: Payer: Self-pay | Admitting: Internal Medicine

## 2015-08-17 ENCOUNTER — Ambulatory Visit: Payer: Self-pay | Admitting: Surgery

## 2015-08-17 NOTE — H&P (Signed)
Stacy Thomas 08/16/2015 11:38 AM Location: [facility] Patient #: [id number] DOB: 1960/06/30 Married / Language: English / Race: Black or African American Female   History of Present Illness Adin Hector MD; 08/17/2015 10:54 AM) The patient is a 55 year old female who presents with hemorrhoids. Note for "Hemorrhoids": Patient returns for concerns of persistent hemorrhoid problems.  Pleasant woman comes in to be evaluated formally for hemorrhoids. History of left above-the-knee amputation with chronic prosthesis. Quite functional and active. Has struggled with anal pain and discomfort. Hemorrhoid issues. Burning with bowel movements. Moves her bowels twice a day now with output magnesium. Felt like fiber did not work. Constipation or better control. She is had a couple flares. Concern for thrombosed hemorrhoid last winter. Overdue for colonoscopy. Seen by gastroenterology. Found to have hemorrhoids. Had some bandage. Had worsening pain. Came to our office. Concern of thrombosis nearby. Resolve nonoperatively. Patient uses nitroglycerin cream occasionally. She feels like that helps her pain. Read however the hemorrhoids will not go away. Topical agents not helping enough. She wished something more definitive be done. Here for surgical evaluation.  She moves her bowels about twice a day now. She was received diagnosed with hepatitis C. She is trying to establish for antiviral therapy. No personal nor family history of GI/colon cancer, inflammatory bowel disease, irritable bowel syndrome, allergy such as Celiac Sprue, dietary/dairy problems, colitis, ulcers nor gastritis. No recent sick contacts/gastroenteritis. No travel outside the country. No changes in diet. No dysphagia to solids or liquids. No significant heartburn or reflux. No hematochezia, hematemesis, coffee ground emesis. No evidence of prior gastric/peptic ulceration.   Allergies (Sonya  Bynum, CMA; 08/16/2015 11:39 AM) Codeine Sulfate *ANALGESICS - OPIOID* Morphine Sulfate (Concentrate) *ANALGESICS - OPIOID*  Medication History (Sonya Bynum, CMA; 08/16/2015 11:39 AM) Anusol-HC (2.5% Cream, 1 (one) Cream Rectal twice daily, as needed, Taken starting 03/09/2015) Active. Gabapentin (300MG  Capsule, Oral daily) Active. Pravastatin Sodium (20MG  Tablet, Oral) Active. Triamterene-HCTZ (37.5-25MG  Tablet, Oral) Active. Medications Reconciled  Vitals (Sonya Bynum CMA; 08/16/2015 11:39 AM) 08/16/2015 11:39 AM Weight: 152 lb Height: 78in Body Surface Area: 2.01 m Body Mass Index: 17.57 kg/m  Pulse: 78 (Regular)  BP: 124/74 (Sitting, Left Arm, Standard)       Physical Exam Adin Hector MD; 08/16/2015 12:14 PM) General Mental Status-Alert. General Appearance-Not in acute distress, Not Sickly. Orientation-Oriented X3. Hydration-Well hydrated. Voice-Normal.  Integumentary Global Assessment Upon inspection and palpation of skin surfaces of the - Axillae: non-tender, no inflammation or ulceration, no drainage. and Distribution of scalp and body hair is normal. General Characteristics Temperature - normal warmth is noted.  Head and Neck Head-normocephalic, atraumatic with no lesions or palpable masses. Face Global Assessment - atraumatic, no absence of expression. Neck Global Assessment - no abnormal movements, no bruit auscultated on the right, no bruit auscultated on the left, no decreased range of motion, non-tender. Trachea-midline. Thyroid Gland Characteristics - non-tender.  Eye Eyeball - Left-Extraocular movements intact, No Nystagmus. Eyeball - Right-Extraocular movements intact, No Nystagmus. Cornea - Left-No Hazy. Cornea - Right-No Hazy. Sclera/Conjunctiva - Left-No scleral icterus, No Discharge. Sclera/Conjunctiva - Right-No scleral icterus, No Discharge. Pupil - Left-Direct reaction to light normal. Pupil -  Right-Direct reaction to light normal.  ENMT Ears Pinna - Left - no drainage observed, no generalized tenderness observed. Right - no drainage observed, no generalized tenderness observed. Nose and Sinuses External Inspection of the Nose - no destructive lesion observed. Inspection of the nares - Left - quiet respiration. Right -  quiet respiration. Mouth and Throat Lips - Upper Lip - no fissures observed, no pallor noted. Lower Lip - no fissures observed, no pallor noted. Nasopharynx - no discharge present. Oral Cavity/Oropharynx - Tongue - no dryness observed. Oral Mucosa - no cyanosis observed. Hypopharynx - no evidence of airway distress observed.  Chest and Lung Exam Inspection Movements - Normal and Symmetrical. Accessory muscles - No use of accessory muscles in breathing. Palpation Palpation of the chest reveals - Non-tender. Auscultation Breath sounds - Normal and Clear.  Cardiovascular Auscultation Rhythm - Regular. Murmurs & Other Heart Sounds - Auscultation of the heart reveals - No Murmurs and No Systolic Clicks.  Abdomen Inspection Inspection of the abdomen reveals - No Visible peristalsis and No Abnormal pulsations. Umbilicus - No Bleeding, No Urine drainage. Palpation/Percussion Palpation and Percussion of the abdomen reveal - Soft, Non Tender, No Rebound tenderness, No Rigidity (guarding) and No Cutaneous hyperesthesia. Note: Overweight but soft. Mild right upper quadrant/epigastric discomfort. No hernia. No true Murphy sign. No diastases. Infraumbilical incision. No umbilical hernia.   Female Genitourinary Sexual Maturity Tanner 5 - Adult hair pattern. Note: No vaginal bleeding nor discharge. No inguinal lymphadenopathy. No inguinal hernias.   Rectal Note: Chronically prolapsed grade 4 internal/external hemorrhoids. Right posterior greater than right anterior greater than left lateral..   Exam done with assistance of female Medical Assistant in the room.  Perianal skin clean with good hygiene. No pruritis ani. No pilonidal disease. No fissure. No abscess/fistula. Normal sphincter tone. Tolerates digital rectal exam but barely. No rectal masses. Did not do anoscopy given discomfort.   Peripheral Vascular Upper Extremity Inspection - Left - No Cyanotic nailbeds, Not Ischemic. Right - No Cyanotic nailbeds, Not Ischemic.  Neurologic Neurologic evaluation reveals -normal attention span and ability to concentrate, able to name objects and repeat phrases. Appropriate fund of knowledge , normal sensation and normal coordination. Mental Status Affect - not angry, not paranoid. Cranial Nerves-Normal Bilaterally. Gait-Normal.  Neuropsychiatric Mental status exam performed with findings of-able to articulate well with normal speech/language, rate, volume and coherence, thought content normal with ability to perform basic computations and apply abstract reasoning and no evidence of hallucinations, delusions, obsessions or homicidal/suicidal ideation. Note: Chatty. Teasing. In a good mood.   Musculoskeletal Global Assessment Spine, Ribs and Pelvis - no instability, subluxation or laxity. Right Upper Extremity - no instability, subluxation or laxity. Note: Status post L AKA with chronic leg prosthesis.   Lymphatic Head & Neck  General Head & Neck Lymphatics: Bilateral - Description - No Localized lymphadenopathy. Axillary  General Axillary Region: Bilateral - Description - No Localized lymphadenopathy. Femoral & Inguinal  Generalized Femoral & Inguinal Lymphatics: Left - Description - No Localized lymphadenopathy. Right - Description - No Localized lymphadenopathy.    Assessment & Plan Adin Hector MD; 08/16/2015 12:27 PM) PROLAPSED INTERNAL HEMORRHOIDS, GRADE 4 (K64.3) Impression: Persistent chronically prolapsed hemorrhoids with testicular external hemorrhoid component. Failed banding therapy. Improved constipation with  magnesium bowel regimen. Does not think fiber works for her.  I think these like a better without an operation. THD hemorrhoidal ligation and excision of remaining tissue.  The fact that she has symptomatic improvement with nitroglycerin makes me wonder if she had a fissure component this. However cannot definitely feel one and she does not have increased sphincter tone. I am skeptical that she will need a sphincterotomy or something else. She wants to keep using nitroglycerin cream since she still has supply. If that helps her doesn't hurt her, I'm okay  with that.  Defer hep C treatment to her gastroenterologist. The hemorrhoids bother her more side think she wants to get that done first in the absence of any liver dysfunction. LFTs in October and March 4 okay. INR is fine. No concerning MELD score. I suspect her epigastric discomfort and right upper quadrant discomfort are related to her hep C hepatitis and liver. He has no biliary colic. She has no gallstones. Looks like screening ultrasound was concerning for higher risk of fibrosis in the future and recommended hep C treatment. She thinks she is waiting for her Harvoni HCV Tx.  Defer screening colonoscopy her gastroenterologist. Because of hemorrhoid are a more pressing issue, I think it is reasonable to do the hemorrhoid surgery first and then plan colonoscopy a couple months later once things have healed up. PROLAPSED INTERNAL HEMORRHOIDS, GRADE 3 (K64.2) EXTERNAL HEMORRHOIDS WITH COMPLICATION (0000000) ENCOUNTER FOR PREOPERATIVE EXAMINATION FOR GENERAL SURGICAL PROCEDURE (Z01.818) Current Plans You are being scheduled for surgery - Our schedulers will call you.  You should hear from our office's scheduling department within 5 working days about the location, date, and time of surgery. We try to make accommodations for patient's preferences in scheduling surgery, but sometimes the OR schedule or the surgeon's schedule prevents [place] from making  those accommodations.  If you have not heard from our office [phone]) in 5 working days, call the office and ask for your surgeon's nurse.  If you have other questions about your diagnosis, plan, or surgery, call the office and ask for your surgeon's nurse.  Pt Education - [facility] Rectal Prep for Anorectal outpatient/office surgery: discussed with patient and provided information. Pt Education - [facility] Rectal Surgery HCI (Maximos Zayas): discussed with patient and provided information. Pt Education - [facility] Hemorrhoids (Genevive Printup): discussed with patient and provided information. Pt Education - [place] (Fleda Pagel) Pt Education - [facility] Pelvic Floor Exercises (Kegels) and Dysfunction HCI (Eleuterio Dollar)  Adin Hector, M.D., F.A.C.S. Gastrointestinal and Minimally Invasive Surgery [facility] [address], [place], [place] [id number] [phone] Main / Paging

## 2015-08-25 ENCOUNTER — Other Ambulatory Visit: Payer: Commercial Managed Care - HMO

## 2015-08-25 ENCOUNTER — Other Ambulatory Visit: Payer: Self-pay | Admitting: Pharmacist Clinician (PhC)/ Clinical Pharmacy Specialist

## 2015-08-25 DIAGNOSIS — B182 Chronic viral hepatitis C: Secondary | ICD-10-CM

## 2015-08-26 ENCOUNTER — Other Ambulatory Visit: Payer: Self-pay | Admitting: Internal Medicine

## 2015-08-26 LAB — DRUG SCREEN, URINE
Amphetamine Screen, Ur: NEGATIVE
BARBITURATE QUANT UR: NEGATIVE
Benzodiazepines.: NEGATIVE
COCAINE METABOLITES: NEGATIVE
Creatinine,U: 195.16 mg/dL
METHADONE: NEGATIVE
Marijuana Metabolite: NEGATIVE
OPIATES: NEGATIVE
PROPOXYPHENE: NEGATIVE
Phencyclidine (PCP): NEGATIVE

## 2015-08-30 NOTE — Telephone Encounter (Signed)
Just spoke with patient.  She agreed to an appt on 09-13-15 @ 8:15 am.

## 2015-09-08 ENCOUNTER — Other Ambulatory Visit: Payer: Self-pay | Admitting: Pharmacist Clinician (PhC)/ Clinical Pharmacy Specialist

## 2015-09-08 MED ORDER — LEDIPASVIR-SOFOSBUVIR 90-400 MG PO TABS: 1.0000 | ORAL_TABLET | Freq: Every day | ORAL | Status: DC

## 2015-09-12 ENCOUNTER — Ambulatory Visit (HOSPITAL_COMMUNITY)
Admission: RE | Admit: 2015-09-12 | Discharge: 2015-09-12 | Disposition: A | Discharge status: Home / Self Care | Payer: Commercial Managed Care - HMO | Source: Ambulatory Visit | Attending: Internal Medicine | Admitting: Internal Medicine

## 2015-09-12 ENCOUNTER — Ambulatory Visit (INDEPENDENT_AMBULATORY_CARE_PROVIDER_SITE_OTHER): Payer: Commercial Managed Care - HMO | Admitting: Internal Medicine

## 2015-09-12 ENCOUNTER — Encounter: Payer: Self-pay | Admitting: Internal Medicine

## 2015-09-12 VITALS — BP 134/72 | HR 79 | Temp 98.6°F | Ht 61.0 in | Wt 157.0 lb

## 2015-09-12 DIAGNOSIS — G609 Hereditary and idiopathic neuropathy, unspecified: Secondary | ICD-10-CM

## 2015-09-12 DIAGNOSIS — R0789 Other chest pain: Secondary | ICD-10-CM

## 2015-09-12 DIAGNOSIS — Z029 Encounter for administrative examinations, unspecified: Secondary | ICD-10-CM | POA: Insufficient documentation

## 2015-09-12 DIAGNOSIS — R1013 Epigastric pain: Secondary | ICD-10-CM | POA: Insufficient documentation

## 2015-09-12 DIAGNOSIS — G629 Polyneuropathy, unspecified: Secondary | ICD-10-CM | POA: Diagnosis not present

## 2015-09-12 DIAGNOSIS — R079 Chest pain, unspecified: Secondary | ICD-10-CM

## 2015-09-12 MED ORDER — GABAPENTIN 300 MG PO CAPS: 300.0000 mg | ORAL_CAPSULE | Freq: Three times a day (TID) | ORAL | Status: DC

## 2015-09-12 NOTE — Assessment & Plan Note (Addendum)
Assessment:  Still having right arm numbness, feels like it start in hands and works up.  Taking NSAIDs (~ 6-8 daily) and gabapentin BID are helping.  Vit B12, TSH, HIV neg. Renal function normal, no DM.  HCV recent dx and she is to start Harvoni this week.  Neck/head ROM does not elicit numbness/pain, there is no muscle atrophy or weakness or significant neck pain so I doubt cervical radiculopathy.  It is possible there is irritation of brachial plexus due to chronic use of crutches (x 30 years). Plan:  Increase gabapentin to TID.  Treat HCV as above.  Stop NSAIDs given epigastric discomfort.  To neurology for EMG/NCS.

## 2015-09-12 NOTE — Progress Notes (Signed)
Subjective:    Patient ID: Stacy Thomas, female    DOB: 1960-10-01, 55 y.o.   MRN: [id number]  HPI Comments: [name] is a 55 year old woman with PMH as below here with c/o right arm pain (years) and numbness and 9/10 chest pain/epigastric pain (two weeks).  No associated diaphoresis.  She is unsure if it is GERD, has not tried antacid but felt better after Pepsi last night.  Right arm pain and numbness have been present for years.    Past Medical History  Diagnosis Date  . Hypertension   . Hypercholesterolemia   . Osteosarcoma of bone ([place]) ~ 30 years ago    left leg  . Arthritis   . Osteoarthritis   . Drug abuse   . Diverticulosis   . Internal hemorrhoids   . External hemorrhoid   . Esophageal stricture   . Hepatitis C    Current Outpatient Prescriptions on File Prior to Visit  Medication Sig Dispense Refill  . AMBULATORY NON FORMULARY MEDICATION Medication Name: Nitroglycerin 0.125 % apply to rectum three times daily x 6-8 weeks 30 g 0  . gabapentin (NEURONTIN) 300 MG capsule Take 1 capsule (300 mg total) by mouth 2 (two) times daily. 60 capsule 1  . hydrocortisone (ANUSOL-HC) 25 MG suppository Place 1 suppository (25 mg total) rectally 2 (two) times daily. For 14 days 28 suppository 0  . Ledipasvir-Sofosbuvir (HARVONI) 90-400 MG TABS Take 1 tablet by mouth daily. 28 tablet 2  . pravastatin (PRAVACHOL) 40 MG tablet Take 1 tablet (40 mg total) by mouth daily. 30 tablet 2  . triamterene-hydrochlorothiazide (MAXZIDE-25) 37.5-25 MG tablet take 1 tablet by mouth once daily 90 tablet 0   No current facility-administered medications on file prior to visit.    Review of Systems  Constitutional: Positive for chills and appetite change. Negative for fever.  Cardiovascular: Positive for chest pain.       Epigastric pain x 2 week.  Non-exertional.    Gastrointestinal: Positive for abdominal pain. Negative for nausea, vomiting, diarrhea and constipation.  Endocrine: Positive for  polyphagia.       Filed Vitals:   09/12/15 1534  BP: 134/72  Pulse: 79  Temp: 98.6 F (37 C)  TempSrc: Oral  Height: 5\' 1"  (1.549 m)  Weight: 157 lb (71.215 kg)  SpO2: 97%    Objective:   Physical Exam  Constitutional: She is oriented to person, place, and time. She appears well-developed. No distress.  HENT:  Head: Normocephalic and atraumatic.  Mouth/Throat: Oropharynx is clear and moist. No oropharyngeal exudate.  Eyes: Conjunctivae and EOM are normal. Pupils are equal, round, and reactive to light. No scleral icterus.  Neck: Neck supple.  Cardiovascular: Normal rate, regular rhythm and normal heart sounds.  Exam reveals no gallop and no friction rub.   No murmur heard. Pulmonary/Chest: Effort normal and breath sounds normal. No respiratory distress. She has no wheezes. She has no rales.  Abdominal: Soft. Bowel sounds are normal. She exhibits no distension. There is no tenderness. There is no rebound.  Musculoskeletal: Normal range of motion. She exhibits no edema or tenderness.  Left AKA  Neurological: She is alert and oriented to person, place, and time. No cranial nerve deficit.  Skin: Skin is warm and dry. She is not diaphoretic.  Psychiatric: She has a normal mood and affect. Her behavior is normal.  Vitals reviewed.         Assessment & Plan:  Please see problem based charting  for A&P.

## 2015-09-12 NOTE — Patient Instructions (Signed)
1. Please stop taking NSAIDs and see if your abdominal pain improves.  Please increase gabapentin to three times per day and I will refer you to neurology.   2. Please take all medications as prescribed.    3. If you have worsening of your symptoms or new symptoms arise, please call the clinic [phone]), or go to the ER immediately if symptoms are severe.  Please return to see PCP in 1 month.

## 2015-09-13 ENCOUNTER — Ambulatory Visit: Payer: Commercial Managed Care - HMO | Admitting: Internal Medicine

## 2015-09-13 ENCOUNTER — Encounter: Payer: Self-pay | Admitting: Pharmacy Technician

## 2015-09-13 DIAGNOSIS — R079 Chest pain, unspecified: Secondary | ICD-10-CM | POA: Insufficient documentation

## 2015-09-13 NOTE — Assessment & Plan Note (Addendum)
Assessment:  Epigastric and non-exertional chest pain x 2 weeks.  Pepsi relieved the pain last night.  She admits to taking 6-8 NSAIDs daily for arm pain/numbness. EKG NSR, no ST changes, unchanged from prior.  Bases on her description of the pain and chronic NSAID use I suspect gastritis.   Plan:  Advised d/c NSAIDs.  No H2 blocker or PPI with Harvoni trx.  Advised to go to ED/call 911 with recurrent chest pain.  Follow-up in 1 month.

## 2015-09-13 NOTE — Assessment & Plan Note (Addendum)
Epigastric and non-exertional chest pain x 2 weeks.  Pepsi relieved the pain last night.  She admits to taking 6-8 NSAIDs daily for arm pain/numbness. EKG NSR, no ST changes, unchanged from prior.  Bases on her description of the pain and chronic NSAID use I suspect gastritis.   Plan:  Advised d/c NSAIDs.  No H2 blocker or PPI with Harvoni trx.  Advised to go to ED/call 911 with recurrent chest pain.  Follow-up in 1 month.

## 2015-09-14 ENCOUNTER — Other Ambulatory Visit: Payer: Self-pay | Admitting: Gastroenterology

## 2015-09-14 ENCOUNTER — Telehealth: Payer: Self-pay | Admitting: *Deleted

## 2015-09-14 ENCOUNTER — Telehealth: Payer: Self-pay

## 2015-09-14 MED ORDER — AMBULATORY NON FORMULARY MEDICATION: Status: DC

## 2015-09-14 NOTE — Telephone Encounter (Signed)
Spoke with pt and let her know she needs to contact the office that prescribed the Harvoni and ask them her question. Nitrogylcerin ointment refilled.

## 2015-09-14 NOTE — Telephone Encounter (Signed)
Pt needs to speak with a nurse regarding meds.

## 2015-09-14 NOTE — Progress Notes (Signed)
Internal Medicine Clinic Attending  Case discussed with Dr. Wilson soon after the resident saw the patient.  We reviewed the resident's history and exam and pertinent patient test results.  I agree with the assessment, diagnosis, and plan of care documented in the resident's note.  

## 2015-09-14 NOTE — Telephone Encounter (Signed)
Patient called and asked if she could have a steroid shot for her knee if she is taking Harvoni. Left message on the voicemail and wants a call back please. Will forward to pharmacy to respond.

## 2015-09-14 NOTE — Telephone Encounter (Signed)
Called her back to let her know that the steroid injection should be ok with her harvoni.

## 2015-09-15 NOTE — Telephone Encounter (Signed)
Tried to call back, not accepting vmail

## 2015-09-16 NOTE — Telephone Encounter (Signed)
Not accepting vmail

## 2015-09-20 NOTE — Telephone Encounter (Signed)
Pt states she is concerned about her blood pressure and if it is too high or too low, refuses appt due to cost, states she will call back if she gets "worse feeling"

## 2015-09-27 ENCOUNTER — Ambulatory Visit (INDEPENDENT_AMBULATORY_CARE_PROVIDER_SITE_OTHER): Payer: Commercial Managed Care - HMO | Admitting: Pharmacist

## 2015-09-27 DIAGNOSIS — B182 Chronic viral hepatitis C: Secondary | ICD-10-CM

## 2015-09-27 NOTE — Progress Notes (Signed)
HPI: Stacy Thomas is a 55 y.o. female presenting for HCV followup. She started Harvoni on 09/13/15.  She states she has had headaches for the first three days but they have since resolved.  She states she does have some drowsiness which she believes is due to her Harvoni. She has some spots on her tongue which are sore and red but she has had them since before she started her medications.   She states she is having a hemorrhoid surgery on July 27th. She did have a cortisone injection last week.   Lab Results  Component Value Date   HEPCGENOTYPE 1a 06/20/2015    Allergies: Allergies  Allergen Reactions  . Codeine Other (See Comments)    Unknown reaction  . Morphine     REACTION: face swells    Vitals:    Past Medical History: Past Medical History  Diagnosis Date  . Hypertension   . Hypercholesterolemia   . Osteosarcoma of bone ([place]) ~ 30 years ago    left leg  . Arthritis   . Osteoarthritis   . Drug abuse   . Diverticulosis   . Internal hemorrhoids   . External hemorrhoid   . Esophageal stricture   . Hepatitis C     Social History: Social History   Social History  . Marital Status: Married    Spouse Name: N/A  . Number of Children: N/A  . Years of Education: N/A   Social History Main Topics  . Smoking status: Former Smoker -- 1.00 packs/day for 37 years    Types: Cigarettes    Start date: 12/19/2014  . Smokeless tobacco: Not on file  . Alcohol Use: No  . Drug Use: No     Comment: former user has been clean 7 years per pt  . Sexual Activity: Not on file   Other Topics Concern  . Not on file   Social History Narrative    Labs: HEPATITIS B SURFACE AG (no units)  Date Value  07/05/2015 NEGATIVE    Lab Results  Component Value Date   HEPCGENOTYPE 1a 06/20/2015    No flowsheet data found.  AST  Date Value  06/13/2015 148 IU/L*  01/03/2015 92 U/L*  09/03/2011 46 U/L*   ALT  Date Value  06/13/2015 108 IU/L*  01/03/2015 64 U/L*   09/03/2011 26 U/L   INR (no units)  Date Value  07/07/2015 1.02  09/03/2011 0.95  04/24/2011 0.94    CrCl: CrCl cannot be calculated (Unknown ideal weight.).  Fibrosis Score: F3/F4 as assessed by [place] elastography    Child-Pugh Score: Child Pugh A  Previous Treatment Regimen: N/A  Assessment: HCV 1a, F3/F4, previously treatment naive.  Started on Harvoni on 09/13/15.  No drug interactions.   Recommendations: Continue Harvoni x 12 weeks Discussed proper adherence to her regimen.  Discussed fibrosis risk with patient and need for further GI evaluation Will need EGD by Dr Havery Moros F/U HCV RNA and CMP in 2 weeks F/U with Dr Linus Salmons in August    Kelsy E Halls, [place].D. Pharmacy Resident [facility] 09/27/2015, 9:05 AM

## 2015-10-07 ENCOUNTER — Other Ambulatory Visit: Payer: Self-pay | Admitting: Internal Medicine

## 2015-10-11 ENCOUNTER — Other Ambulatory Visit: Payer: Commercial Managed Care - HMO

## 2015-10-11 DIAGNOSIS — B182 Chronic viral hepatitis C: Secondary | ICD-10-CM

## 2015-10-12 LAB — COMPLETE METABOLIC PANEL WITH GFR
ALT: 31 U/L — AB (ref 6–29)
AST: 58 U/L — AB (ref 10–35)
Albumin: 3.8 g/dL (ref 3.6–5.1)
Alkaline Phosphatase: 86 U/L (ref 33–130)
BUN: 15 mg/dL (ref 7–25)
CO2: 24 mmol/L (ref 20–31)
CREATININE: 0.99 mg/dL (ref 0.50–1.05)
Calcium: 9.3 mg/dL (ref 8.6–10.4)
Chloride: 103 mmol/L (ref 98–110)
GFR, Est African American: 74 mL/min (ref >=60)
GFR, Est Non African American: 64 mL/min (ref >=60)
Glucose, Bld: 87 mg/dL (ref 65–99)
Potassium: 4.7 mmol/L (ref 3.5–5.3)
SODIUM: 137 mmol/L (ref 135–146)
TOTAL PROTEIN: 7.1 g/dL (ref 6.1–8.1)
Total Bilirubin: 1 mg/dL (ref 0.2–1.2)

## 2015-10-12 LAB — HEPATITIS C RNA QUANTITATIVE: HCV Quantitative: NOT DETECTED IU/mL (ref <15)

## 2015-10-20 ENCOUNTER — Telehealth: Payer: Self-pay | Admitting: Pharmacist Clinician (PhC)/ Clinical Pharmacy Specialist

## 2015-10-20 NOTE — Telephone Encounter (Signed)
Stacy Thomas called to inquire about her hep C VL. Explained to her that it's undetectable right now and she needs to finish out the 3 months.

## 2015-10-21 ENCOUNTER — Encounter: Payer: Self-pay | Admitting: Neurology

## 2015-10-21 ENCOUNTER — Encounter (HOSPITAL_BASED_OUTPATIENT_CLINIC_OR_DEPARTMENT_OTHER): Payer: Self-pay | Admitting: *Deleted

## 2015-10-21 ENCOUNTER — Ambulatory Visit (INDEPENDENT_AMBULATORY_CARE_PROVIDER_SITE_OTHER): Payer: Commercial Managed Care - HMO | Admitting: Neurology

## 2015-10-21 VITALS — BP 120/84 | HR 80 | Ht 61.0 in | Wt 160.3 lb

## 2015-10-21 DIAGNOSIS — M5412 Radiculopathy, cervical region: Secondary | ICD-10-CM

## 2015-10-21 DIAGNOSIS — R29898 Other symptoms and signs involving the musculoskeletal system: Secondary | ICD-10-CM

## 2015-10-21 DIAGNOSIS — M792 Neuralgia and neuritis, unspecified: Secondary | ICD-10-CM | POA: Diagnosis not present

## 2015-10-21 DIAGNOSIS — M6281 Muscle weakness (generalized): Secondary | ICD-10-CM

## 2015-10-21 MED ORDER — GABAPENTIN 300 MG PO CAPS: 600.0000 mg | ORAL_CAPSULE | Freq: Three times a day (TID) | ORAL | Status: DC

## 2015-10-21 NOTE — Patient Instructions (Addendum)
1.  Increase Gabapentin 300 mg tablets    Morning       Afternoon        Evening  Week 1 1 tab              1 tab            2 tab              Week 2 1 tab          2 tab             2 tab              Continue  2 tab          2 tab             2 tab         If you develop increased sleepiness, stay at the lower dose.           2.  Go to [facility] store to see if there are other options to give you comfort when using the crutches  3.  If you change your mind about the electrodiagnostic testing (EMG), please call my office  Return to clinic as needed

## 2015-10-21 NOTE — Progress Notes (Signed)
[facility] Clinic Note - Initial Visit   Date: 10/21/2015  SHELBYLYN SHIMMIN MRN: [id number] DOB: 25-Jun-1960   Dear Dr. Redmond Pulling:  Thank you for your kind referral of ELECIA REHAGEN for consultation of bilateral arm numbness. Although her history is well known to you, please allow [place] to reiterate it for the purpose of our medical record. The patient was accompanied to the clinic by self.    History of Present Illness: EBONYE DEFUSCO is a 55 y.o. left-handed African American female with hepatitis C, hypertension, hyperlipidemia, history of osteosarcoma s/p left AKA (1982)  presenting for evaluation of bilateral arm pain and numbness.    She has been using crutches for 37+ years.  Starting around ~ 2012, she began having numbness over the hands which radiates up her arms, but this has become worse over the past several months.  Her hand tends to stay flexed and she has been dropping things more often.  She also has hypersensitivity of the armpits.  She also complains of generalized whole body pain involving the arms, neck, back, and legs.  She uses a crutches mostly because it enables her to walk without using her prostetic. She has a wheelchair, but the layout of her home makes it very difficult to use this.  She was diagnosed with hepatitis C and started Harvoni.  She is admits using IV drugs for one year and 30+ years of smoking crack cocaine, but has been clean since 2007.  Her mood is very depressed and she is very tearful during the visit.  She has a lot of medical and homes issues.  Her son started living with them a week ago after being released from prison on house arrest, which is causing her distress.  Out-side paper records, electronic medical record, and images have been reviewed where available and summarized as:  Lab Results  Component Value Date   TSH 1.990 06/20/2015   Lab Results  Component Value Date   [id number] 06/13/2015     Past  Medical History  Diagnosis Date  . Hypertension   . Hypercholesterolemia   . Osteosarcoma of bone ([place]) ~ 30 years ago    left leg  . Arthritis   . Osteoarthritis   . Drug abuse   . Diverticulosis   . Internal hemorrhoids   . External hemorrhoid   . Esophageal stricture   . Hepatitis C     Past Surgical History  Procedure Laterality Date  . Above knee leg amputation  ~ 30 years    left  . Breast biopsy      determine to be a cyst  . Tonsillectomy      child     Medications:  Outpatient Encounter Prescriptions as of 10/21/2015  Medication Sig  . AMBULATORY NON FORMULARY MEDICATION Medication Name: Nitroglycerin 0.125 % apply to rectum three times daily x 6-8 weeks  . gabapentin (NEURONTIN) 300 MG capsule Take 2 capsules (600 mg total) by mouth 3 (three) times daily.  . hydrocortisone (ANUSOL-HC) 25 MG suppository Place 1 suppository (25 mg total) rectally 2 (two) times daily. For 14 days  . Ledipasvir-Sofosbuvir (HARVONI) 90-400 MG TABS Take 1 tablet by mouth daily.  . pravastatin (PRAVACHOL) 40 MG tablet take 1 tablet by mouth once daily  . triamterene-hydrochlorothiazide (MAXZIDE-25) 37.5-25 MG tablet take 1 tablet by mouth once daily  . [DISCONTINUED] gabapentin (NEURONTIN) 300 MG capsule Take 1 capsule (300 mg total) by mouth 3 (three) times daily.  No facility-administered encounter medications on file as of 10/21/2015.     Allergies:  Allergies  Allergen Reactions  . Morphine Swelling    REACTION: face swells  . Codeine Other (See Comments)    Unknown reaction    Family History: Family History  Problem Relation Age of Onset  . Anesthesia problems Neg Hx   . COPD Father   . Alcohol abuse Father     Deceased  . Hypertension Mother     Living  . Healthy Daughter   . Healthy Son     Social History: Social History  Substance Use Topics  . Smoking status: Former Smoker -- 1.00 packs/day for 37 years    Types: Cigarettes    Start date: 12/19/2014  .  Smokeless tobacco: Never Used  . Alcohol Use: No   Social History   Social History Narrative   Lives with husband in a one story home.  Has 3 children.  Does not work.    Education: some college.    Review of Systems:  CONSTITUTIONAL: No fevers, chills, night sweats, or weight loss.   EYES: No visual changes or eye pain ENT: No hearing changes.  No history of nose bleeds.   RESPIRATORY: No cough, wheezing and shortness of breath.   CARDIOVASCULAR: Negative for chest pain, and palpitations.   GI: Negative for abdominal discomfort, +blood in stools or black stools.  +recent change in bowel habits.   GU:  No history of incontinence.   MUSCLOSKELETAL: +history of joint pain or swelling.  +myalgias.   SKIN: Negative for lesions, rash, and itching.   HEMATOLOGY/ONCOLOGY: Negative for prolonged bleeding, bruising easily, and swollen nodes.  +history of cancer.   ENDOCRINE: Negative for cold or heat intolerance, polydipsia or goiter.   PSYCH:  +depression or anxiety symptoms.   NEURO: As Above.   Vital Signs:  BP 120/84 mmHg  Pulse 80  Ht 5\' 1"  (1.549 m)  Wt 160 lb 5 oz (72.717 kg)  BMI 30.31 kg/m2  SpO2 95%  LMP 12/02/2010   General Medical Exam:   General:  Sad-appearing, tearful, comfortable.   Eyes/ENT: see cranial nerve examination.   Neck: No masses appreciated.  Full range of motion without tenderness.  Respiratory:  Clear to auscultation, good air entry bilaterally.   Cardiac:  Regular rate and rhythm, no murmur.   Extremities:  Left AKA, no edema, or skin discoloration.  Skin:  No rashes or lesions.  Neurological Exam: MENTAL STATUS including orientation to time, place, person, recent and remote memory, attention span and concentration, language, and fund of knowledge is normal.  Speech is not dysarthric.  CRANIAL NERVES: II:  No visual field defects.  Unremarkable fundi.   III-IV-VI: Pupils equal round and reactive to light.  Normal conjugate, extra-ocular eye  movements in all directions of gaze.  No nystagmus.  No ptosis.   V:  Normal facial sensation.     VII:  Normal facial symmetry and movements.    VIII:  Normal hearing and vestibular function.   IX-X:  Normal palatal movement.   XI:  Normal shoulder shrug and head rotation.   XII:  Normal tongue strength and range of motion, no deviation or fasciculation.  MOTOR:  No atrophy, fasciculations or abnormal movements.  No pronator drift.  Tone is normal.   *poor effort with distal testing ?pain related Right Upper Extremity:    Left Upper Extremity:    Deltoid  5/5   Deltoid  5/5   Biceps  5/5   Biceps  5/5   Triceps  5/5   Triceps  5/5   Wrist extensors  5-/5   Wrist extensors  5/5   Wrist flexors  5-/5   Wrist flexors  5/5   Finger extensors  4/5   Finger extensors  4/5   Finger flexors  4/5   Finger flexors  4/5   Dorsal interossei  4/5   Dorsal interossei  4/5   Abductor pollicis  5/5   Abductor pollicis  5/5   Tone (Ashworth scale)  0  Tone (Ashworth scale)  0   Right Lower Extremity:    Left Lower Extremity: *AKA    Hip flexors  5/5   Hip flexors  5/5   Hip extensors  5/5   Hip extensors  5/5   Knee flexors  5/5      Knee extensors  5/5      Dorsiflexors  5/5      Plantarflexors  5/5      Toe extensors  5/5      Toe flexors  5/5      Tone (Ashworth scale)  0      MSRs:  Right                                                                 Left brachioradialis 2+  brachioradialis 2+  biceps 2+  biceps 2+  triceps 2+  triceps 2+  patellar 2+     ankle jerk 2+     Hoffman no     plantar response down      SENSORY:  Normal and symmetric perception of light touch, pinprick, vibration.    COORDINATION/GAIT: Normal finger-to- nose-finger.  Intact rapid alternating movements bilaterally.  Gait is assisted unsteady with to prosthetic leg. She does not have crutches today.    IMPRESSION: Mrs. Harlow Ohms is a 55 year-old female referred for evaluation of chronic bilateral arm pain,  paresthesias, and hand weakness.  Her exam is somewhat limits by poor effort, making localization difficult.  From my testing, it appears that she has distal hand weakness, but I'm not sure whether this is pain-limited or true weakness.  NCS/EMG will certainly help localize her symptoms as a brachial plexopathy neuralgia affecting the inferior cord is most likely, however C8 radiculopathy cannot be excluded.  I suspect that she has chronic compression injury to the brachial plexus, especially as she has noticed improvement when she does not use the crutches as much.  However, she does not wish to do any additional testing, specifically NCS/EMG.    I can try to optimize pain control and will increase gabapentin 300mg  to three times daily (titration schedule given).  Physical therapy for hand strengthening was also declined.  To the best of my ability, I offered alternatives to using axillary crutches such as forearm crutches, but these do not allow her to walk without her prosthetic, so prefers axillary crutches.  Alternative option is using a wheelchair/motorized scooter, however the set up of her home limits this.  She may want to go to a medical supply store and see if there are options to modify her axilllary crutches.  Her orthopaedic surgeon may be able to offer better suggestions.   The duration of this  appointment visit was 45 minutes of face-to-face time with the patient.  Greater than 50% of this time was spent in counseling, explanation of diagnosis, planning of further management, and coordination of care.   Thank you for allowing me to participate in patient's care.  If I can answer any additional questions, I would be pleased to do so.    Sincerely,    Donika K. Posey Pronto, DO

## 2015-10-24 ENCOUNTER — Encounter (HOSPITAL_BASED_OUTPATIENT_CLINIC_OR_DEPARTMENT_OTHER): Payer: Self-pay | Admitting: *Deleted

## 2015-10-24 NOTE — Progress Notes (Signed)
NPO AFTER MN.  ARRIVE AT 1000.  NEEDS ISTAT.  CURRENT EKG IN CHART AND EPIC.  WILL DO HIBICLENS SHOWER HS BEFORE AND AM DOS.  PT VERBALIZED UNDERSTANDING RECTAL PREP INSTRUCTIONS GIVEN BY OFFICE.  WILL TAKE GABAPENTIN AM DOS W/ SIPS OF WATER.

## 2015-10-27 ENCOUNTER — Encounter (HOSPITAL_BASED_OUTPATIENT_CLINIC_OR_DEPARTMENT_OTHER)
Admission: RE | Disposition: A | Discharge status: Home / Self Care | Payer: Self-pay | Source: Ambulatory Visit | Attending: Surgery

## 2015-10-27 ENCOUNTER — Ambulatory Visit (HOSPITAL_BASED_OUTPATIENT_CLINIC_OR_DEPARTMENT_OTHER): Payer: Commercial Managed Care - HMO | Admitting: Anesthesiology

## 2015-10-27 ENCOUNTER — Ambulatory Visit (HOSPITAL_BASED_OUTPATIENT_CLINIC_OR_DEPARTMENT_OTHER)
Admission: RE | Admit: 2015-10-27 | Discharge: 2015-10-27 | Disposition: A | Discharge status: Home / Self Care | Payer: Commercial Managed Care - HMO | Source: Ambulatory Visit | Attending: Surgery | Admitting: Surgery

## 2015-10-27 ENCOUNTER — Encounter (HOSPITAL_BASED_OUTPATIENT_CLINIC_OR_DEPARTMENT_OTHER): Payer: Self-pay | Admitting: *Deleted

## 2015-10-27 DIAGNOSIS — I10 Essential (primary) hypertension: Secondary | ICD-10-CM | POA: Diagnosis not present

## 2015-10-27 DIAGNOSIS — Z89612 Acquired absence of left leg above knee: Secondary | ICD-10-CM | POA: Insufficient documentation

## 2015-10-27 DIAGNOSIS — Z87891 Personal history of nicotine dependence: Secondary | ICD-10-CM | POA: Insufficient documentation

## 2015-10-27 DIAGNOSIS — B182 Chronic viral hepatitis C: Secondary | ICD-10-CM | POA: Diagnosis present

## 2015-10-27 DIAGNOSIS — K643 Fourth degree hemorrhoids: Secondary | ICD-10-CM | POA: Diagnosis present

## 2015-10-27 DIAGNOSIS — Z79899 Other long term (current) drug therapy: Secondary | ICD-10-CM | POA: Insufficient documentation

## 2015-10-27 DIAGNOSIS — K5900 Constipation, unspecified: Secondary | ICD-10-CM | POA: Diagnosis not present

## 2015-10-27 DIAGNOSIS — K644 Residual hemorrhoidal skin tags: Secondary | ICD-10-CM

## 2015-10-27 HISTORY — DX: Pain in right arm: M79.601

## 2015-10-27 HISTORY — DX: Hereditary and idiopathic neuropathy, unspecified: G60.9

## 2015-10-27 HISTORY — PX: TRANSANAL HEMORRHOIDAL DEARTERIALIZATION: SHX6136

## 2015-10-27 HISTORY — DX: Other psychoactive substance use, unspecified, in remission: F19.91

## 2015-10-27 HISTORY — DX: Hyperlipidemia, unspecified: E78.5

## 2015-10-27 HISTORY — DX: Other symptoms and signs involving the musculoskeletal system: R29.898

## 2015-10-27 HISTORY — DX: Presence of spectacles and contact lenses: Z97.3

## 2015-10-27 HISTORY — DX: Personal history of other specified conditions: Z87.898

## 2015-10-27 HISTORY — DX: Personal history of other diseases of the digestive system: Z87.19

## 2015-10-27 HISTORY — DX: Other psychoactive substance dependence, in remission: F19.21

## 2015-10-27 HISTORY — DX: Personal history of malignant neoplasm of bone: Z85.830

## 2015-10-27 HISTORY — DX: Pain in left arm: M79.602

## 2015-10-27 HISTORY — DX: Pain in right arm: M79.602

## 2015-10-27 HISTORY — DX: Fourth degree hemorrhoids: K64.3

## 2015-10-27 HISTORY — DX: Dependence on other enabling machines and devices: Z99.89

## 2015-10-27 HISTORY — DX: Residual hemorrhoidal skin tags: K64.4

## 2015-10-27 HISTORY — DX: Diverticulosis of large intestine without perforation or abscess without bleeding: K57.30

## 2015-10-27 HISTORY — DX: Chronic viral hepatitis C: B18.2

## 2015-10-27 LAB — POCT I-STAT 4, (NA,K, GLUC, HGB,HCT)
GLUCOSE: 97 mg/dL (ref 65–99)
HEMATOCRIT: 43 % (ref 36.0–46.0)
HEMOGLOBIN: 14.6 g/dL (ref 12.0–15.0)
POTASSIUM: 4.4 mmol/L (ref 3.5–5.1)
SODIUM: 141 mmol/L (ref 135–145)

## 2015-10-27 SURGERY — TRANSANAL HEMORRHOIDAL DEARTERIALIZATION
Anesthesia: General | Site: Rectum

## 2015-10-27 MED ORDER — ROCURONIUM BROMIDE 100 MG/10ML IV SOLN
INTRAVENOUS | Status: AC
Start: 2015-10-27 — End: 2015-10-27
  Filled 2015-10-27: qty 1

## 2015-10-27 MED ORDER — DIBUCAINE 1 % RE OINT
TOPICAL_OINTMENT | RECTAL | Status: DC | PRN
  Administered 2015-10-27: 1 via RECTAL

## 2015-10-27 MED ORDER — PROPOFOL 10 MG/ML IV BOLUS
INTRAVENOUS | Status: DC | PRN
  Administered 2015-10-27: 200 mg via INTRAVENOUS

## 2015-10-27 MED ORDER — LACTATED RINGERS IV SOLN
INTRAVENOUS | Status: DC
  Administered 2015-10-27 (×2): via INTRAVENOUS
  Filled 2015-10-27: qty 1000

## 2015-10-27 MED ORDER — OXYCODONE HCL 5 MG PO TABS
ORAL_TABLET | ORAL | Status: AC
  Filled 2015-10-27: qty 1

## 2015-10-27 MED ORDER — FENTANYL CITRATE (PF) 100 MCG/2ML IJ SOLN
INTRAMUSCULAR | Status: AC
  Filled 2015-10-27: qty 2

## 2015-10-27 MED ORDER — OXYCODONE HCL 5 MG PO TABS: 5.0000 mg | ORAL_TABLET | ORAL | 0 refills | Status: DC | PRN

## 2015-10-27 MED ORDER — LIDOCAINE HCL (CARDIAC) 20 MG/ML IV SOLN
INTRAVENOUS | Status: AC
  Filled 2015-10-27: qty 10

## 2015-10-27 MED ORDER — BUPIVACAINE-EPINEPHRINE 0.25% -1:200000 IJ SOLN
INTRAMUSCULAR | Status: DC | PRN
  Administered 2015-10-27: 30 mL

## 2015-10-27 MED ORDER — BUPIVACAINE LIPOSOME 1.3 % IJ SUSP
INTRAMUSCULAR | Status: DC | PRN
  Administered 2015-10-27: 20 mL

## 2015-10-27 MED ORDER — BUPIVACAINE LIPOSOME 1.3 % IJ SUSP
20.0000 mL | INTRAMUSCULAR | Status: DC
Start: 2015-10-27 — End: 2015-10-27
  Filled 2015-10-27: qty 20

## 2015-10-27 MED ORDER — PROMETHAZINE HCL 25 MG/ML IJ SOLN
6.2500 mg | INTRAMUSCULAR | Status: DC | PRN
  Filled 2015-10-27: qty 1

## 2015-10-27 MED ORDER — DIBUCAINE 1 % RE OINT
TOPICAL_OINTMENT | RECTAL | Status: AC
  Filled 2015-10-27: qty 28

## 2015-10-27 MED ORDER — CHLORHEXIDINE GLUCONATE 4 % EX LIQD
1.0000 "application " | Freq: Once | CUTANEOUS | Status: DC
  Filled 2015-10-27: qty 15

## 2015-10-27 MED ORDER — BUPIVACAINE LIPOSOME 1.3 % IJ SUSP
INTRAMUSCULAR | Status: AC
  Filled 2015-10-27: qty 20

## 2015-10-27 MED ORDER — BUPIVACAINE-EPINEPHRINE (PF) 0.25% -1:200000 IJ SOLN
INTRAMUSCULAR | Status: AC
Start: 2015-10-27 — End: 2015-10-27
  Filled 2015-10-27: qty 30

## 2015-10-27 MED ORDER — DEXAMETHASONE SODIUM PHOSPHATE 4 MG/ML IJ SOLN
INTRAMUSCULAR | Status: DC | PRN
  Administered 2015-10-27: 10 mg via INTRAVENOUS

## 2015-10-27 MED ORDER — ONDANSETRON HCL 4 MG/2ML IJ SOLN
INTRAMUSCULAR | Status: AC
  Filled 2015-10-27: qty 2

## 2015-10-27 MED ORDER — METRONIDAZOLE IN NACL 5-0.79 MG/ML-% IV SOLN
INTRAVENOUS | Status: AC
  Filled 2015-10-27: qty 100

## 2015-10-27 MED ORDER — MIDAZOLAM HCL 2 MG/2ML IJ SOLN
INTRAMUSCULAR | Status: AC
  Filled 2015-10-27: qty 2

## 2015-10-27 MED ORDER — LIDOCAINE HCL 4 % EX SOLN
CUTANEOUS | Status: DC | PRN
  Administered 2015-10-27: 2 mL via TOPICAL

## 2015-10-27 MED ORDER — FENTANYL CITRATE (PF) 100 MCG/2ML IJ SOLN
INTRAMUSCULAR | Status: DC | PRN
  Administered 2015-10-27 (×4): 25 ug via INTRAVENOUS
  Administered 2015-10-27 (×2): 50 ug via INTRAVENOUS

## 2015-10-27 MED ORDER — ONDANSETRON HCL 4 MG/2ML IJ SOLN
4.0000 mg | Freq: Once | INTRAMUSCULAR | Status: AC
  Administered 2015-10-27: 4 mg via INTRAVENOUS
  Filled 2015-10-27: qty 2

## 2015-10-27 MED ORDER — HYDROCORTISONE 2.5 % RE CREA: 1.0000 "application " | TOPICAL_CREAM | Freq: Two times a day (BID) | RECTAL | 2 refills | Status: DC

## 2015-10-27 MED ORDER — DEXAMETHASONE SODIUM PHOSPHATE 10 MG/ML IJ SOLN
INTRAMUSCULAR | Status: AC
  Filled 2015-10-27: qty 1

## 2015-10-27 MED ORDER — EPHEDRINE SULFATE 50 MG/ML IJ SOLN
INTRAMUSCULAR | Status: AC
  Filled 2015-10-27: qty 1

## 2015-10-27 MED ORDER — SUCCINYLCHOLINE CHLORIDE 20 MG/ML IJ SOLN
INTRAMUSCULAR | Status: DC | PRN
  Administered 2015-10-27: 100 mg via INTRAVENOUS

## 2015-10-27 MED ORDER — METRONIDAZOLE IN NACL 5-0.79 MG/ML-% IV SOLN
500.0000 mg | INTRAVENOUS | Status: AC
  Administered 2015-10-27: 500 mg via INTRAVENOUS
  Filled 2015-10-27: qty 100

## 2015-10-27 MED ORDER — PROPOFOL 10 MG/ML IV BOLUS
INTRAVENOUS | Status: AC
Start: 2015-10-27 — End: 2015-10-27
  Filled 2015-10-27: qty 20

## 2015-10-27 MED ORDER — CEFAZOLIN SODIUM-DEXTROSE 2-4 GM/100ML-% IV SOLN
2.0000 g | INTRAVENOUS | Status: AC
  Administered 2015-10-27: 2 g via INTRAVENOUS
  Filled 2015-10-27: qty 100

## 2015-10-27 MED ORDER — OXYCODONE HCL 5 MG PO TABS
5.0000 mg | ORAL_TABLET | Freq: Once | ORAL | Status: AC
Start: 2015-10-27 — End: 2015-10-27
  Administered 2015-10-27: 5 mg via ORAL
  Filled 2015-10-27: qty 1

## 2015-10-27 MED ORDER — ONDANSETRON HCL 4 MG/2ML IJ SOLN
INTRAMUSCULAR | Status: DC | PRN
  Administered 2015-10-27: 4 mg via INTRAVENOUS

## 2015-10-27 MED ORDER — LIDOCAINE HCL (CARDIAC) 20 MG/ML IV SOLN
INTRAVENOUS | Status: DC | PRN
  Administered 2015-10-27: 100 mg via INTRAVENOUS

## 2015-10-27 MED ORDER — FENTANYL CITRATE (PF) 100 MCG/2ML IJ SOLN
25.0000 ug | INTRAMUSCULAR | Status: DC | PRN
  Administered 2015-10-27: 25 ug via INTRAVENOUS
  Filled 2015-10-27: qty 1

## 2015-10-27 MED ORDER — CEFAZOLIN SODIUM-DEXTROSE 2-4 GM/100ML-% IV SOLN
INTRAVENOUS | Status: AC
  Filled 2015-10-27: qty 100

## 2015-10-27 MED ORDER — SODIUM CHLORIDE 0.9 % IJ SOLN
INTRAMUSCULAR | Status: AC
Start: 2015-10-27 — End: 2015-10-27
  Filled 2015-10-27: qty 50

## 2015-10-27 MED ORDER — MIDAZOLAM HCL 5 MG/5ML IJ SOLN
INTRAMUSCULAR | Status: DC | PRN
  Administered 2015-10-27: 2 mg via INTRAVENOUS

## 2015-10-27 MED FILL — oxyCODONE HCL 5 MG TABS: 5 | 3 days supply | Qty: 40 | Fill #0

## 2015-10-27 SURGICAL SUPPLY — 69 items
APL SKNCLS STERI-STRIP NONHPOA (GAUZE/BANDAGES/DRESSINGS) ×1
APPLICATOR COTTON TIP 6IN STRL (MISCELLANEOUS) IMPLANT
BENZOIN TINCTURE PRP APPL 2/3 (GAUZE/BANDAGES/DRESSINGS) ×3 IMPLANT
BLADE HEX COATED 2.75 (ELECTRODE) IMPLANT
BLADE SURG 15 STRL LF DISP TIS (BLADE) ×1 IMPLANT
BLADE SURG 15 STRL SS (BLADE) ×3
BNDG GAUZE ELAST 4 BULKY (GAUZE/BANDAGES/DRESSINGS) IMPLANT
BRIEF STRETCH FOR OB PAD LRG (UNDERPADS AND DIAPERS) ×3 IMPLANT
CANISTER SUCTION 1200CC (MISCELLANEOUS) ×3 IMPLANT
CLEANER CAUTERY TIP 5X5 PAD (MISCELLANEOUS) ×1 IMPLANT
CLOTH BEACON ORANGE TIMEOUT ST (SAFETY) ×3 IMPLANT
COVER BACK TABLE 60X90IN (DRAPES) ×3 IMPLANT
COVER MAYO STAND STRL (DRAPES) ×3 IMPLANT
DECANTER SPIKE VIAL GLASS SM (MISCELLANEOUS) ×1 IMPLANT
DRAPE LAPAROTOMY 100X72 PEDS (DRAPES) ×3 IMPLANT
DRAPE LG THREE QUARTER DISP (DRAPES) ×3 IMPLANT
DRAPE UTILITY XL STRL (DRAPES) ×3 IMPLANT
DRSG PAD ABDOMINAL 8X10 ST (GAUZE/BANDAGES/DRESSINGS) IMPLANT
ELECT REM PT RETURN 9FT ADLT (ELECTROSURGICAL) ×3
ELECTRODE REM PT RTRN 9FT ADLT (ELECTROSURGICAL) ×1 IMPLANT
GAUZE SPONGE 4X4 12PLY STRL (GAUZE/BANDAGES/DRESSINGS) ×1 IMPLANT
GAUZE SPONGE 4X4 16PLY XRAY LF (GAUZE/BANDAGES/DRESSINGS) IMPLANT
GLOVE BIOGEL PI IND STRL 8 (GLOVE) ×1 IMPLANT
GLOVE BIOGEL PI INDICATOR 8 (GLOVE) ×2
GLOVE ECLIPSE 8.0 STRL XLNG CF (GLOVE) ×3 IMPLANT
GLOVE INDICATOR 8.0 STRL GRN (GLOVE) ×3 IMPLANT
GOWN STRL REUS W/ TWL LRG LVL3 (GOWN DISPOSABLE) IMPLANT
GOWN STRL REUS W/ TWL XL LVL3 (GOWN DISPOSABLE) ×1 IMPLANT
GOWN STRL REUS W/TWL LRG LVL3 (GOWN DISPOSABLE) ×6
GOWN STRL REUS W/TWL XL LVL3 (GOWN DISPOSABLE) ×3
KIT ROOM TURNOVER WOR (KITS) ×3 IMPLANT
KIT SLIDE ONE PROLAPS HEMORR (KITS) ×2 IMPLANT
LEGGING LITHOTOMY PAIR STRL (DRAPES) ×1 IMPLANT
MANIFOLD NEPTUNE II (INSTRUMENTS) IMPLANT
NDL HYPO 25X1 1.5 SAFETY (NEEDLE) ×1 IMPLANT
NDL SAFETY ECLIPSE 18X1.5 (NEEDLE) IMPLANT
NEEDLE HYPO 18GX1.5 SHARP (NEEDLE) ×3
NEEDLE HYPO 22GX1.5 SAFETY (NEEDLE) ×3 IMPLANT
NEEDLE HYPO 25X1 1.5 SAFETY (NEEDLE) ×3 IMPLANT
NS IRRIG 500ML POUR BTL (IV SOLUTION) ×3 IMPLANT
PACK BASIN DAY SURGERY FS (CUSTOM PROCEDURE TRAY) ×3 IMPLANT
PAD ABD 8X10 STRL (GAUZE/BANDAGES/DRESSINGS) ×3 IMPLANT
PAD CLEANER CAUTERY TIP 5X5 (MISCELLANEOUS) ×2
PENCIL BUTTON HOLSTER BLD 10FT (ELECTRODE) ×3 IMPLANT
SPONGE GAUZE 4X4 12PLY (GAUZE/BANDAGES/DRESSINGS) ×2 IMPLANT
SPONGE SURGIFOAM ABS GEL 100 (HEMOSTASIS) IMPLANT
SPONGE SURGIFOAM ABS GEL 12-7 (HEMOSTASIS) IMPLANT
STAPLER PROXIMATE HCS (STAPLE) ×3 IMPLANT
STAPLER VISISTAT 35W (STAPLE) IMPLANT
SURGILUBE 2OZ TUBE FLIPTOP (MISCELLANEOUS) ×3 IMPLANT
SUT CHROMIC 2 0 SH (SUTURE) IMPLANT
SUT CHROMIC 3 0 SH 27 (SUTURE) IMPLANT
SUT VIC AB 2-0 SH 27 (SUTURE) ×3
SUT VIC AB 2-0 SH 27XBRD (SUTURE) ×1 IMPLANT
SUT VIC AB 2-0 UR6 27 (SUTURE) IMPLANT
SUT VICRYL AB 2 0 TIE (SUTURE) ×1 IMPLANT
SUT VICRYL AB 2 0 TIES (SUTURE) ×3
SYR 20CC LL (SYRINGE) ×5 IMPLANT
SYR CONTROL 10ML LL (SYRINGE) ×3 IMPLANT
TAPE CLOTH 3X10 TAN LF (GAUZE/BANDAGES/DRESSINGS) ×3 IMPLANT
TAPE CLOTH SURG 6X10 WHT LF (GAUZE/BANDAGES/DRESSINGS) IMPLANT
TOWEL OR 17X24 6PK STRL BLUE (TOWEL DISPOSABLE) ×6 IMPLANT
TOWEL OR NON WOVEN STRL DISP B (DISPOSABLE) ×3 IMPLANT
TRAY DSU PREP LF (CUSTOM PROCEDURE TRAY) ×3 IMPLANT
TUBE CONNECTING 12'X1/4 (SUCTIONS) ×1
TUBE CONNECTING 12X1/4 (SUCTIONS) ×2 IMPLANT
UNDERPAD 30X30 INCONTINENT (UNDERPADS AND DIAPERS) ×3 IMPLANT
WATER STERILE IRR 500ML POUR (IV SOLUTION) ×3 IMPLANT
YANKAUER SUCT BULB TIP NO VENT (SUCTIONS) ×3 IMPLANT

## 2015-10-27 NOTE — Anesthesia Preprocedure Evaluation (Addendum)
Anesthesia Evaluation  Patient identified by MRN, date of birth, ID band Patient awake  General Assessment Comment:S/P left BKA secondary to bone cancer 35 years ago.  Chronic pain issues in neck, back, stump, right hand. Took neurontin today.  Reviewed: Allergy & Precautions, NPO status , Patient's Chart, lab work & pertinent test results  Airway Mallampati: II  TM Distance: >3 FB Neck ROM: Full    Dental no notable dental hx.    Pulmonary neg pulmonary ROS, former smoker,    Pulmonary exam normal breath sounds clear to auscultation       Cardiovascular hypertension, Normal cardiovascular exam Rhythm:Regular Rate:Normal     Neuro/Psych  Neuromuscular disease negative psych ROS   GI/Hepatic negative GI ROS, (+)     substance abuse  cocaine use, Hepatitis -, [place]rack cocaine use many years ago. Chronic hepatitis with some abdominal pain. She avoids acetaminophen.   Endo/Other  negative endocrine ROS  Renal/GU negative Renal ROS  negative genitourinary   Musculoskeletal  (+) Arthritis ,   Abdominal   Peds negative pediatric ROS (+)  Hematology negative hematology ROS (+)   Anesthesia Other Findings   Reproductive/Obstetrics negative OB ROS                            Anesthesia Physical Anesthesia Plan  ASA: III  Anesthesia Plan: General   Post-op Pain Management:    Induction: Intravenous  Airway Management Planned: Oral ETT and LMA  Additional Equipment:   Intra-op Plan: Utilization Of Total Body Hypothermia per surgeon request  Post-operative Plan: Extubation in OR  Informed Consent: I have reviewed the patients History and Physical, chart, labs and discussed the procedure including the risks, benefits and alternatives for the proposed anesthesia with the patient or authorized representative who has indicated his/her understanding and acceptance.   Dental advisory  given  Plan Discussed with: CRNA  Anesthesia Plan Comments: (Airway depending upon patient position.)       Anesthesia Quick Evaluation

## 2015-10-27 NOTE — Anesthesia Postprocedure Evaluation (Signed)
Anesthesia Post Note  Patient: Stacy Thomas  Procedure(s) Performed: Procedure(s) (LRB): TRANSANAL HEMORRHOIDAL DEARTERIALIZATION (N/A) EXAM UNDER ANESTHESIA EXTERNAL HEMORRHOIDECTOMY (N/A)  Patient location during evaluation: PACU Anesthesia Type: General Level of consciousness: awake Pain management: pain level controlled Vital Signs Assessment: post-procedure vital signs reviewed and stable Respiratory status: spontaneous breathing Cardiovascular status: stable Postop Assessment: no signs of nausea or vomiting Anesthetic complications: no    Last Vitals:  Vitals:   10/27/15 0924 10/27/15 1439  BP: 112/80 (!) 136/93  Pulse: 71 98  Resp: 16 14  Temp: 36.9 C 36.4 C    Last Pain:  Vitals:   10/27/15 0938  TempSrc:   PainSc: 8                  Darric Plante

## 2015-10-27 NOTE — Interval H&P Note (Signed)
History and Physical Interval Note:  10/27/2015 12:34 PM  Stacy Thomas  has presented today for surgery, with the diagnosis of grade 4 internal AND exteral hemorrhoids with pain  The various methods of treatment have been discussed with the patient and family. After consideration of risks, benefits and other options for treatment, the patient has consented to  Procedure(s): TRANSANAL HEMORRHOIDAL DEARTERIALIZATION (N/A) EXAM UNDER ANESTHESIA EXTERNAL HEMORRHOIDECTOMY (N/A) as a surgical intervention .  The patient's history has been reviewed, patient examined, no change in status, stable for surgery.  I have reviewed the patient's chart and labs.  Questions were answered to the patient's satisfaction.     Chayton Murata C.

## 2015-10-27 NOTE — Anesthesia Procedure Notes (Signed)
Procedure Name: Intubation Date/Time: 10/27/2015 12:51 PM Performed by: Mechele Claude Pre-anesthesia Checklist: Patient identified, Emergency Drugs available, Suction available and Patient being monitored Patient Re-evaluated:Patient Re-evaluated prior to inductionOxygen Delivery Method: Circle system utilized Preoxygenation: Pre-oxygenation with 100% oxygen Intubation Type: IV induction Ventilation: Mask ventilation without difficulty Tube type: Oral Tube size: 7.0 mm Number of attempts: 1 Airway Equipment and Method: Stylet and LTA kit utilized Placement Confirmation: ETT inserted through vocal cords under direct vision,  positive ETCO2 and breath sounds checked- equal and bilateral Secured at: 21 cm Tube secured with: Tape Dental Injury: Teeth and Oropharynx as per pre-operative assessment

## 2015-10-27 NOTE — Transfer of Care (Signed)
Last Vitals:  Vitals:   10/27/15 0924  BP: 112/80  Pulse: 71  Resp: 16  Temp: 36.9 C    Last Pain:  Vitals:   10/27/15 0938  TempSrc:   PainSc: 8       Patients Stated Pain Goal: 8 (10/27/15 [id number])  Immediate Anesthesia Transfer of Care Note  Patient: Stacy Thomas  Procedure(s) Performed: Procedure(s) (LRB): TRANSANAL HEMORRHOIDAL DEARTERIALIZATION (N/A) EXAM UNDER ANESTHESIA EXTERNAL HEMORRHOIDECTOMY (N/A)  Patient Location: PACU  Anesthesia Type: General  Level of Consciousness: awake, alert  and oriented  Airway & Oxygen Therapy: Patient Spontanous Breathing and Patient connected to face mask oxygen  Post-op Assessment: Report given to PACU RN and Post -op Vital signs reviewed and stable  Post vital signs: Reviewed and stable  Complications: No apparent anesthesia complications

## 2015-10-27 NOTE — H&P (Signed)
Stacy Thomas 08/16/2015 11:38 AM Location: [facility] Patient #: [id number] DOB: 1960-10-11 Married / Language: English / Race: Black or African American Female  Patient Care Team: Dellia Nims, MD as PCP - General (Internal Medicine)   History of Present Illness  The patient is a 55 year old female who presents with hemorrhoids. Note for "Hemorrhoids": Patient returns for concerns of persistent hemorrhoid problems.  Pleasant woman comes in to be evaluated formally for hemorrhoids. History of left above-the-knee amputation with chronic prosthesis. Quite functional and active. Has struggled with anal pain and discomfort. Hemorrhoid issues. Burning with bowel movements. Moves her bowels twice a day now with output magnesium. Felt like fiber did not work. Constipation or better control. She is had a couple flares. Concern for thrombosed hemorrhoid last winter. Overdue for colonoscopy. Seen by gastroenterology. Found to have hemorrhoids. Had some bandage. Had worsening pain. Came to our office. Concern of thrombosis nearby. Resolve nonoperatively. Patient uses nitroglycerin cream occasionally. She feels like that helps her pain. Read however the hemorrhoids will not go away. Topical agents not helping enough. She wished something more definitive be done. Here for surgical evaluation.  She moves her bowels about twice a day now. She was received diagnosed with hepatitis C. She is trying to establish for antiviral therapy. No personal nor family history of GI/colon cancer, inflammatory bowel disease, irritable bowel syndrome, allergy such as Celiac Sprue, dietary/dairy problems, colitis, ulcers nor gastritis. No recent sick contacts/gastroenteritis. No travel outside the country. No changes in diet. No dysphagia to solids or liquids. No significant heartburn or reflux. No hematochezia, hematemesis, coffee ground emesis. No evidence of prior  gastric/peptic ulceration.   Allergies (Sonya Bynum, CMA; 08/16/2015 11:39 AM) Codeine Sulfate *ANALGESICS - OPIOID* Morphine Sulfate (Concentrate) *ANALGESICS - OPIOID*  Medication History (Sonya Bynum, CMA; 08/16/2015 11:39 AM) Anusol-HC (2.5% Cream, 1 (one) Cream Rectal twice daily, as needed, Taken starting 03/09/2015) Active. Gabapentin (300MG  Capsule, Oral daily) Active. Pravastatin Sodium (20MG  Tablet, Oral) Active. Triamterene-HCTZ (37.5-25MG  Tablet, Oral) Active. Medications Reconciled  Vitals (Sonya Bynum CMA; 08/16/2015 11:39 AM) 08/16/2015 11:39 AM Weight: 152 lb Height: 78in Body Surface Area: 2.01 m Body Mass Index: 17.57 kg/m  Pulse: 78 (Regular)  BP: 124/74 (Sitting, Left Arm, Standard)       Physical Exam Adin Hector MD; 08/16/2015 12:14 PM) General Mental Status-Alert. General Appearance-Not in acute distress, Not Sickly. Orientation-Oriented X3. Hydration-Well hydrated. Voice-Normal.  Integumentary Global Assessment Upon inspection and palpation of skin surfaces of the - Axillae: non-tender, no inflammation or ulceration, no drainage. and Distribution of scalp and body hair is normal. General Characteristics Temperature - normal warmth is noted.  Head and Neck Head-normocephalic, atraumatic with no lesions or palpable masses. Face Global Assessment - atraumatic, no absence of expression. Neck Global Assessment - no abnormal movements, no bruit auscultated on the right, no bruit auscultated on the left, no decreased range of motion, non-tender. Trachea-midline. Thyroid Gland Characteristics - non-tender.  Eye Eyeball - Left-Extraocular movements intact, No Nystagmus. Eyeball - Right-Extraocular movements intact, No Nystagmus. Cornea - Left-No Hazy. Cornea - Right-No Hazy. Sclera/Conjunctiva - Left-No scleral icterus, No Discharge. Sclera/Conjunctiva - Right-No scleral icterus, No  Discharge. Pupil - Left-Direct reaction to light normal. Pupil - Right-Direct reaction to light normal.  ENMT Ears Pinna - Left - no drainage observed, no generalized tenderness observed. Right - no drainage observed, no generalized tenderness observed. Nose and Sinuses External Inspection of the Nose - no destructive lesion observed. Inspection of the nares -  Left - quiet respiration. Right - quiet respiration. Mouth and Throat Lips - Upper Lip - no fissures observed, no pallor noted. Lower Lip - no fissures observed, no pallor noted. Nasopharynx - no discharge present. Oral Cavity/Oropharynx - Tongue - no dryness observed. Oral Mucosa - no cyanosis observed. Hypopharynx - no evidence of airway distress observed.  Chest and Lung Exam Inspection Movements - Normal and Symmetrical. Accessory muscles - No use of accessory muscles in breathing. Palpation Palpation of the chest reveals - Non-tender. Auscultation Breath sounds - Normal and Clear.  Cardiovascular Auscultation Rhythm - Regular. Murmurs & Other Heart Sounds - Auscultation of the heart reveals - No Murmurs and No Systolic Clicks.  Abdomen Inspection Inspection of the abdomen reveals - No Visible peristalsis and No Abnormal pulsations. Umbilicus - No Bleeding, No Urine drainage. Palpation/Percussion Palpation and Percussion of the abdomen reveal - Soft, Non Tender, No Rebound tenderness, No Rigidity (guarding) and No Cutaneous hyperesthesia. Note: Overweight but soft. Mild right upper quadrant/epigastric discomfort. No hernia. No true Murphy sign. No diastases. Infraumbilical incision. No umbilical hernia.   Female Genitourinary Sexual Maturity Tanner 5 - Adult hair pattern. Note: No vaginal bleeding nor discharge. No inguinal lymphadenopathy. No inguinal hernias.   Rectal Note: Chronically prolapsed grade 4 internal/external hemorrhoids. Right posterior greater than right anterior greater than left  lateral..   Exam done with assistance of female Medical Assistant in the room. Perianal skin clean with good hygiene. No pruritis ani. No pilonidal disease. No fissure. No abscess/fistula. Normal sphincter tone. Tolerates digital rectal exam but barely. No rectal masses. Did not do anoscopy given discomfort.   Peripheral Vascular Upper Extremity Inspection - Left - No Cyanotic nailbeds, Not Ischemic. Right - No Cyanotic nailbeds, Not Ischemic.  Neurologic Neurologic evaluation reveals -normal attention span and ability to concentrate, able to name objects and repeat phrases. Appropriate fund of knowledge , normal sensation and normal coordination. Mental Status Affect - not angry, not paranoid. Cranial Nerves-Normal Bilaterally. Gait-Normal.  Neuropsychiatric Mental status exam performed with findings of-able to articulate well with normal speech/language, rate, volume and coherence, thought content normal with ability to perform basic computations and apply abstract reasoning and no evidence of hallucinations, delusions, obsessions or homicidal/suicidal ideation. Note: Chatty. Teasing. In a good mood.   Musculoskeletal Global Assessment Spine, Ribs and Pelvis - no instability, subluxation or laxity. Right Upper Extremity - no instability, subluxation or laxity. Note: Status post L AKA with chronic leg prosthesis.   Lymphatic Head & Neck  General Head & Neck Lymphatics: Bilateral - Description - No Localized lymphadenopathy. Axillary  General Axillary Region: Bilateral - Description - No Localized lymphadenopathy. Femoral & Inguinal  Generalized Femoral & Inguinal Lymphatics: Left - Description - No Localized lymphadenopathy. Right - Description - No Localized lymphadenopathy.    Assessment & Plan  PROLAPSED INTERNAL HEMORRHOIDS, GRADE 4 (K64.3) Impression: Persistent chronically prolapsed hemorrhoids with external hemorrhoid component. Failed  banding therapy. Improved constipation with magnesium bowel regimen. Does not think fiber works for her.  I think these like a better without an operation. THD hemorrhoidal ligation and excision of remaining tissue.  The fact that she has symptomatic improvement with nitroglycerin makes me wonder if she had a fissure component this. However cannot definitely feel one and she does not have increased sphincter tone. I am skeptical that she will need a sphincterotomy or something else. She wants to keep using nitroglycerin cream since she still has supply. If that helps her doesn't hurt her, I'm okay with  that.  Defer hep C treatment to her gastroenterologist. The hemorrhoids bother her more so she wants to get that done first in the absence of any liver dysfunction. LFTs in October and March 4 okay. INR is fine. No concerning MELD score. I suspect her epigastric discomfort and right upper quadrant discomfort are related to her hep C hepatitis and liver. He has no biliary colic. She has no gallstones. Looks like screening ultrasound was concerning for higher risk of fibrosis in the future and recommended hep C treatment. She thinks she is waiting for her Harvoni HCV Tx.  Defer screening colonoscopy her gastroenterologist. Because of hemorrhoid are a more pressing issue, I think it is reasonable to do the hemorrhoid surgery first and then plan colonoscopy a couple months later once things have healed up.  PROLAPSED INTERNAL HEMORRHOIDS, GRADE 3 (K64.2) EXTERNAL HEMORRHOIDS WITH COMPLICATION (0000000) ENCOUNTER FOR PREOPERATIVE EXAMINATION FOR GENERAL SURGICAL PROCEDURE (Z01.818) Current Plans You are being scheduled for surgery - Our schedulers will call you.  You should hear from our office's scheduling department within 5 working days about the location, date, and time of surgery. We try to make accommodations for patient's preferences in scheduling surgery, but sometimes the OR schedule or the  surgeon's schedule prevents [place] from making those accommodations.  If you have not heard from our office [phone]) in 5 working days, call the office and ask for your surgeon's nurse.  If you have other questions about your diagnosis, plan, or surgery, call the office and ask for your surgeon's nurse.  Pt Education - [facility] Rectal Prep for Anorectal outpatient/office surgery: discussed with patient and provided information. Pt Education - [facility] Rectal Surgery HCI (Sanayah Munro): discussed with patient and provided information. Pt Education - [facility] Hemorrhoids ([place] Reliford): discussed with patient and provided information. Pt Education - [facility] (Elsia Lasota) Pt Education - [facility] Pelvic Floor Exercises (Kegels) and Dysfunction HCI (Mckay Tegtmeyer)  Adin Hector, M.D., F.A.C.S. Gastrointestinal and Minimally Invasive Surgery [facility] [address] [place], [place] [address] [phone] Main / Paging

## 2015-10-27 NOTE — Discharge Instructions (Signed)
Post Anesthesia Home Care Instructions  Activity: Get plenty of rest for the remainder of the day. A responsible adult should stay with you for 24 hours following the procedure.  For the next 24 hours, DO NOT: -Drive a car -Paediatric nurse -Drink alcoholic beverages -Take any medication unless instructed by your physician -Make any legal decisions or sign important papers.  Meals: Start with liquid foods such as gelatin or soup. Progress to regular foods as tolerated. Avoid greasy, spicy, heavy foods. If nausea and/or vomiting occur, drink only clear liquids until the nausea and/or vomiting subsides. Call your physician if vomiting continues.  Special Instructions/Symptoms: Your throat may feel dry or sore from the anesthesia or the breathing tube placed in your throat during surgery. If this causes discomfort, gargle with warm salt water. The discomfort should disappear within 24 hours.  If you had a scopolamine patch placed behind your ear for the management of post- operative nausea and/or vomiting:  1. The medication in the patch is effective for 72 hours, after which it should be removed.  Wrap patch in a tissue and discard in the trash. Wash hands thoroughly with soap and water. 2. You may remove the patch earlier than 72 hours if you experience unpleasant side effects which may include dry mouth, dizziness or visual disturbances. 3. Avoid touching the patch. Wash your hands with soap and water after contact with the patch.   ANORECTAL SURGERY:  POST OPERATIVE INSTRUCTIONS  ######################################################################  EAT Gradually transition to a high fiber diet with a fiber supplement over the next few weeks after discharge.  Start with a pureed / full liquid diet (see below)  WALK Walk an hour a day.  Control your pain to do that.    CONTROL PAIN Control pain so that you can walk, sleep, tolerate sneezing/coughing, go up/down stairs.  HAVE  A BOWEL MOVEMENT DAILY Keep your bowels regular to avoid problems.  OK to try a laxative to override constipation.  OK to use an antidairrheal to slow down diarrhea.  Call if not better after 2 tries  CALL IF YOU HAVE PROBLEMS/CONCERNS Call if you are still struggling despite following these instructions. Call if you have concerns not answered by these instructions  ######################################################################    1. Take your usually prescribed home medications unless otherwise directed. 2. DIET: Follow a light bland diet the first 24 hours after arrival home, such as soup, liquids, crackers, etc.  Be sure to include lots of fluids daily.  Avoid fast food or heavy meals as your are more likely to get nauseated.  Eat a low fat the next few days after surgery.   3. PAIN CONTROL: a. Pain is best controlled by a usual combination of three different methods TOGETHER: i. Ice/Heat ii. Over the counter pain medication iii. Prescription pain medication b. Most patients will experience some swelling and discomfort in the anus/rectal area. and incisions.  Ice packs or heat (30-60 minutes up to 6 times a day) will help. Use ice for the first few days to help decrease swelling and bruising, then switch to heat such as warm towels, sitz baths, warm baths, etc to help relax tight/sore spots and speed recovery.  Some people prefer to use ice alone, heat alone, alternating between ice & heat.  Experiment to what works for you.  Swelling and bruising can take several weeks to resolve.   c. It is helpful to take an over-the-counter pain medication regularly for the first few weeks.  Choose one  of the following that works best for you: i. Naproxen (Aleve, etc)  Two 220mg  tabs twice a day ii. Ibuprofen (Advil, etc) Three 200mg  tabs four times a day (every meal & bedtime) iii. Acetaminophen (Tylenol, etc) 500-650mg  four times a day (every meal & bedtime) d. A  prescription for pain  medication (such as oxycodone, hydrocodone, etc) should be given to you upon discharge.  Take your pain medication as prescribed.  i. If you are having problems/concerns with the prescription medicine (does not control pain, nausea, vomiting, rash, itching, etc), please call us [phone] to see if we need to switch you to a different pain medicine that will work better for you and/or control your side effect better. ii. If you need a refill on your pain medication, please contact your pharmacy.  They will contact our office to request authorization. Prescriptions will not be filled after 5 pm or on week-ends.  Use a Sitz Bath 4-8 times a day for relief   [employer] A sitz bath is a warm water bath taken in the sitting position that covers only the hips and buttocks. It may be used for either healing or hygiene purposes. Sitz baths are also used to relieve pain, itching, or muscle spasms. The water may contain medicine. Moist heat will help you heal and relax.  HOME CARE INSTRUCTIONS  Take 3 to 4 sitz baths a day. 1. Fill the bathtub half full with warm water. 2. Sit in the water and open the drain a little. 3. Turn on the warm water to keep the tub half full. Keep the water running constantly. 4. Soak in the water for 15 to 20 minutes. 5. After the sitz bath, pat the affected area dry first.   4. KEEP YOUR BOWELS REGULAR a. The goal is one bowel movement a day b. Avoid getting constipated.  Between the surgery and the pain medications, it is common to experience some constipation.  Increasing fluid intake and taking a fiber supplement (such as Metamucil, Citrucel, FiberCon, MiraLax, etc) 1-2 times a day regularly will usually help prevent this problem from occurring.  A mild laxative (prune juice, Milk of Magnesia, MiraLax, etc) should be taken according to package directions if there are no bowel movements after 48 hours. c. Watch out for diarrhea.  If you have many loose bowel movements,  simplify your diet to bland foods & liquids for a few days.  Stop any stool softeners and decrease your fiber supplement.  Switching to mild anti-diarrheal medications (Kayopectate, Pepto Bismol) can help.  If this worsens or does not improve, please call us.  5. Wound Care  a. Remove your bandages the day after surgery.  Unless discharge instructions indicate otherwise, leave your bandage dry and in place overnight.  Remove the bandage during your first bowel movement.   b. Wear an absorbent pad or soft cotton gauze in your underwear as needed to catch any drainage and help keep the area  c. Keep the area clean and dry.  Bathe / shower every day.  Keep the area clean by showering / bathing over the incision / wound.   It is okay to soak an open wound to help wash it.  Wet wipes or showers / gentle washing after bowel movements is often less traumatic than regular toilet paper. d. Dennis Bast will often notice bleeding with bowel movements.  This should slow down by the end of the first week of surgery e. Expect some drainage.  This should  slow down, too, by the end of the first week of surgery.  Wear an absorbent pad or soft cotton gauze in your underwear until the drainage stops.  6. ACTIVITIES as tolerated:   a. You may resume regular (light) daily activities beginning the next day--such as daily self-care, walking, climbing stairs--gradually increasing activities as tolerated.  If you can walk 30 minutes without difficulty, it is safe to try more intense activity such as jogging, treadmill, bicycling, low-impact aerobics, swimming, etc. b. Save the most intensive and strenuous activity for last such as sit-ups, heavy lifting, contact sports, etc  Refrain from any heavy lifting or straining until you are off narcotics for pain control.   c. DO NOT PUSH THROUGH PAIN.  Let pain be your guide: If it hurts to do something, don't do it.  Pain is your body warning you to avoid that activity for another week until  the pain goes down. d. You may drive when you are no longer taking prescription pain medication, you can comfortably sit for long periods of time, and you can safely maneuver your car and apply brakes. e. Dennis Bast may have sexual intercourse when it is comfortable.  7. FOLLOW UP in our office a. Please call [facility] at [phone] to set up an appointment to see your surgeon in the office for a follow-up appointment approximately 2 weeks after your surgery. b. Make sure that you call for this appointment the day you arrive home to insure a convenient appointment time. 10. IF YOU HAVE DISABILITY OR FAMILY LEAVE FORMS, BRING THEM TO THE OFFICE FOR PROCESSING.  DO NOT GIVE THEM TO YOUR DOCTOR.        WHEN TO CALL us [phone]: 1. Poor pain control 2. Reactions / problems with new medications (rash/itching, nausea, etc)  3. Fever over 101.5 F (38.5 C) 4. Inability to urinate 5. Nausea and/or vomiting 6. Worsening swelling or bruising 7. Continued bleeding from incision. 8. Increased pain, redness, or drainage from the incision  The clinic staff is available to answer your questions during regular business hours (8:30am-5pm).  Please dont hesitate to call and ask to speak to one of our nurses for clinical concerns.   A surgeon from [facility] is always on call at the hospitals   If you have a medical emergency, go to the nearest emergency room or call 911.    [facility], [address], [place], [place], [place]  [address] ? MAIN: [phone] ? TOLL FREE: [phone] ? FAX [phone] [email or web address]   HEMORRHOIDS  The rectum is the last foot of your colon, and it naturally stretches to hold stool.  Hemorrhoidal piles are natural clusters of blood vessels that help the rectum and anal canal stretch to hold stool and allow bowel movements to eliminate feces.   Hemorrhoids are abnormally swollen blood vessels in the rectum.   Too much pressure in the rectum causes hemorrhoids by forcing blood to stretch and bulge the walls of the veins, sometimes even rupturing them.  Hemorrhoids can become like varicose veins you might see on a person's legs.  Most people will develop a flare of hemorrhoids in their lifetime.  When bulging hemorrhoidal veins are irritated, they can swell, burn, itch, cause pain, and bleed.  Most flares will calm down gradually own within a few weeks.  However, once hemorrhoids are created, they are difficult to get rid of completely and tend to flare more easily than the first  flare.   Fortunately, good habits and simple medical treatment usually control hemorrhoids well, and surgery is needed only in severe cases. Types of Hemorrhoids:  Internal hemorrhoids usually don't initially hurt or itch; they are deep inside the rectum and usually have no sensation. If they begin to push out (prolapse), pain and burning can occur.  However, internal hemorrhoids can bleed.  Anal bleeding should not be ignored since bleeding could come from a dangerous source like colorectal cancer, so persistent rectal bleeding should be investigated by a doctor, sometimes with a colonoscopy.  External hemorrhoids cause most of the symptoms - pain, burning, and itching. Nonirritated hemorrhoids can look like small skin tags coming out of the anus.   Thrombosed hemorrhoids can form when a hemorrhoid blood vessel bursts and causes the hemorrhoid to suddenly swell.  A purple blood clot can form in it and become an excruciatingly painful lump at the anus. Because of these unpleasant symptoms, immediate incision and drainage by a surgeon at an office visit can provide much relief of the pain.    PREVENTION Avoiding the most frequent causes listed below will prevent most cases of hemorrhoids: Constipation Hard stools Diarrhea  Constant sitting  Straining with bowel movements Sitting on the toilet for a long time  Severe coughing   episodes Pregnancy / Childbirth  Heavy Lifting  Sometimes avoiding the above triggers is difficult:  How can you avoid sitting all day if you have a seated job? Also, we try to avoid coughing and diarrhea, but sometimes its beyond your control.  Still, there are some practical hints to help: Keep the anal and genital area clean.  Moistened tissues such as flushable wet wipes are less irritating than toilet paper.  Using irrigating showers or bottle irrigation washing gently cleans this sensitive area.   Avoid dry toilet paper when cleaning after bowel movements.  Marland Kitchen Keep the anal and genital area dry.  Lightly pat the rectal area dry.  Avoid rubbing.  Talcum or baby powders can help GET YOUR STOOLS SOFT.   This is the most important way to prevent irritated hemorrhoids.  Hard stools are like sandpaper to the anorectal canal and will cause more problems.  The goal: ONE SOFT BOWEL MOVEMENT A DAY!  BMs from every other day to 3 times a day is a tolerable range Treat coughing, diarrhea and constipation early since irritated hemorrhoids may soon follow.  If your main job activity is seated, always stand or walk during your breaks. Make it a point to stand and walk at least 5 minutes every hour and try to shift frequently in your chair to avoid direct rectal pressure.  Always exhale as you strain or lift. Don't hold your breath.  Do not delay or try to prevent a bowel movement when the urge is present. Exercise regularly (walking or jogging 60 minutes a day) to stimulate the bowels to move. No reading or other activity while on the toilet. If bowel movements take longer than 5 minutes, you are too constipated. AVOID CONSTIPATION Drink plenty of liquids (1 1/2 to 2 quarts of water and other fluids a day unless fluid restricted for another medical condition). Liquids that contain caffeine (coffee a, tea, soft drinks) can be dehydrating and should be avoided until constipation is controlled. Consider minimizing  milk, as dairy products may be constipating. Eat plenty of fiber (30g a day ideal, more if needed).  Fiber is the undigested part of plant food that passes into the colon, acting  as natures broom to encourage bowel motility and movement.  Fiber can absorb and hold large amounts of water. This results in a larger, bulkier stool, which is soft and easier to pass.  Eating foods high in fiber - 12 servings - such as  Vegetables: Root (potatoes, carrots, turnips), Leafy green (lettuce, salad greens, celery, spinach), High residue (cabbage, broccoli, etc.) Fruit: Fresh, Dried (prunes, apricots, cherries), Stewed (applesauce)  Whole grain breads, pasta, whole wheat Bran cereals, muffins, etc. Consider adding supplemental bulking fiber which retains large volumes of water: Psyllium ground seeds (native plant from [place])--available as Metamucil, Konsyl, Effersyllium, Per Diem Fiber, or the less expensive generic forms.  Citrucel  (methylcellulose wood fiber) . FiberCon (Polycarbophil) Polyethylene Glycol - and artificial fiber commonly called Miralax or Glycolax.  It is helpful for people with gassy or bloated feelings with regular fiber Flax Seed - a less gassy natural fiber  Laxatives can be useful for a short period if constipation is severe Osmotics (Milk of Magnesia, Fleets Phospho-Soda, Magnesium Citrate)  Stimulants (Senokot,   Castor Oil,  Dulcolax, Ex-Lax)    Laxatives are not a good long-term solution as it can stress the bowels and cause too much mineral loss and dehydration.   Avoid taking laxatives for more than 7 days in a row.  AVOID DIARRHEA Switch to liquids and simpler foods for a few days to avoid stressing your intestines further. Avoid dairy products (especially milk & ice cream) for a short time.  The intestines often can lose the ability to digest lactose when stressed. Avoid foods that cause gassiness or bloating.  Typical foods include beans and other legumes, cabbage,  broccoli, and dairy foods.  Every person has some sensitivity to other foods, so listen to your body and avoid those foods that trigger problems for you. Adding fiber (Citrucel, Metamucil, FiberCon, Flax seed, Miralax) gradually can help thicken stools by absorbing excess fluid and retrain the intestines to act more normally.  Slowly increase the dose over a few weeks.  Too much fiber too soon can backfire and cause cramping & bloating. Probiotics (such as active yogurt, Align, etc) may help repopulate the intestines and colon with normal bacteria and calm down a sensitive digestive tract.  Most studies show it to be of mild help, though, and such products can be costly. Medicines: Bismuth subsalicylate (ex. Kayopectate, Pepto Bismol) every 30 minutes for up to 6 doses can help control diarrhea.  Avoid if pregnant. Loperamide (Immodium) can slow down diarrhea.  Start with two tablets (4mg  total) first and then try one tablet every 6 hours.  Avoid if you are having fevers or severe pain.  If you are not better or start feeling worse, stop all medicines and call your doctor for advice Call your doctor if you are getting worse or not better.  Sometimes further testing (cultures, endoscopy, X-ray studies, bloodwork, etc) may be needed to help diagnose and treat the cause of the diarrhea.  TROUBLESHOOTING IRREGULAR BOWELS 1) Avoid extremes of bowel movements (no bad constipation/diarrhea) 2) Miralax 17gm mixed in 8oz. water or juice-daily. May use BID as needed.  3) Gas-x,Phazyme, etc. as needed for gas & bloating.  4) Soft,bland diet. No spicy,greasy,fried foods.  5) Prilosec over-the-counter as needed  6) May hold gluten/wheat products from diet to see if symptoms improve.  7)  May try probiotics (Align, Activa, etc) to help calm the bowels down 7) If symptoms become worse call back immediately.   TREATMENT OF HEMORRHOID FLARE  If these preventive measures fail, you must take action right away!  Hemorrhoids are one condition that can be mild in the morning and become intolerable by nightfall. Most hemorrhoidal flares take several weeks to calm down.  These suggestions can help: Warm soaks.  This helps more than any topical medication.  Use up to 8 times a day.  Usually sitz baths or sitting in a warm bathtub helps.  Sitting on moist warm towels are helpful.  Switching to ice packs/cool compresses can be helpful  Use a Sitz Bath 4-8 times a day for relief A sitz bath is a warm water bath taken in the sitting position that covers only the hips and buttocks. It may be used for either healing or hygiene purposes. Sitz baths are also used to relieve pain, itching, or muscle spasms. The water may contain medicine. Moist heat will help you heal and relax.  HOME CARE INSTRUCTIONS  Take 3 to 4 sitz baths a day. 6. Fill the bathtub half full with warm water. 7. Sit in the water and open the drain a little. 8. Turn on the warm water to keep the tub half full. Keep the water running constantly. 9. Soak in the water for 15 to 20 minutes. 10. After the sitz bath, pat the affected area dry first. SEEK MEDICAL CARE IF:  You get worse instead of better. Stop the sitz baths if you get worse.  Normalize your bowels.  Extremes of diarrhea or constipation will make hemorrhoids worse.  One soft bowel movement a day is the goal.  Fiber can help get your bowels regular Wet wipes instead of toilet paper Pain control with a NSAID such as ibuprofen (Advil) or naproxen (Aleve) or acetaminophen (Tylenol) around the clock.  Narcotics are constipating and should be minimized if possible Topical creams contain steroids (bydrocortisone) or local anesthetic (xylocaine) can help make pain and itching more tolerable.   EVALUATION If hemorrhoids are still causing problems, you could benefit by an evaluation by a surgeon.  The surgeon will obtain a history and examine you.  If hemorrhoids are diagnosed, some therapies can  be offered in the office, usually with an anoscope into the less sensitive area of the rectum: -injection of hemorrhoids (sclerotherapy) can scar the blood vessels of the swollen/enlarged hemorrhoids to help shrink them down to a more normal size -rubber banding of the enlarged hemorrhoids to help shrink them down to a more normal size -drainage of the blood clot causing a thrombosed hemorrhoid,  to relieve the severe pain   While 90% of the time such problems from hemorrhoids can be managed without preceding to surgery, sometimes the hemorrhoids require a operation to control the problem (uncontrolled bleeding, prolapse, pain, etc.).   This involves being placed under general anesthesia where the surgeon can confirm the diagnosis and remove, suture, or staple the hemorrhoid(s).  Your surgeon can help you treat the problem appropriately.

## 2015-10-27 NOTE — Op Note (Signed)
10/27/2015  2:24 PM  PATIENT:  Stacy Thomas  55 y.o. female  Patient Care Team: Dellia Nims, MD as PCP - General (Internal Medicine)  PRE-OPERATIVE DIAGNOSIS:  grade 4 internal AND exteral hemorrhoids with pain  POST-OPERATIVE DIAGNOSIS:  Grade 4 internal AND exteral hemorrhoids with pain  PROCEDURE:   TRANSANAL HEMORRHOIDAL DEARTERIALIZATION and sutured pexy EXAM UNDER ANESTHESIA EXTERNAL HEMORRHOIDECTOMY  SURGEON:  Surgeon(s): Michael Boston, MD  ASSISTANT: none   ANESTHESIA:   Local field block Anorectal block General  0.25% bupivacaine with epinephrine at the beginning of the case.  Liposomal bupivacaine (Experel) at the end of the case.  EBL:  Total I/O In: 700 [I.V.:700] Out: 20 [Blood:20]  Delay start of Pharmacological VTE agent (>24hrs) due to surgical blood loss or risk of bleeding:  no  DRAINS: none   SPECIMEN:  Source of Specimen:  External hemorrhoids  DISPOSITION OF SPECIMEN:  PATHOLOGY  COUNTS:  YES  PLAN OF CARE: Discharge to home after PACU  PATIENT DISPOSITION:  PACU - hemodynamically stable.  INDICATION: Pleasant woman with chronically prolapsed grade 4 internal hemorrhoids with external hemorrhoids as well.  I tried banding the past per her request but has not worked.  I recommended a ligation and sutured pexy and hemorrhoidectomy of remaining tissue.  She agreed.  The anatomy & physiology of the anorectal region was discussed.  The pathophysiology of hemorrhoids and differential diagnosis was discussed.  Natural history risks without surgery was discussed.   I stressed the importance of a bowel regimen to have daily soft bowel movements to minimize progression of disease.  Interventions such as sclerotherapy & banding were discussed.  The patient's symptoms are not adequately controlled by medicines and other non-operative treatments.  I feel the risks & problems of no surgery outweigh the operative risks; therefore, I recommended surgery to  treat the hemorrhoids by ligation, pexy, and possible resection.  Risks such as bleeding, infection, urinary difficulties, need for further treatment, heart attack, death, and other risks were discussed.   I noted a good likelihood this will help address the problem.  Goals of post-operative recovery were discussed as well.  Possibility that this will not correct all symptoms was explained.  Post-operative pain, bleeding, constipation, and other problems after surgery were discussed.  We will work to minimize complications.   Educational handouts further explaining the pathology, treatment options, and bowel regimen were given as well.  Questions were answered.  The patient expresses understanding & wishes to proceed with surgery.   OR FINDINGS: Grade 4 internal hemorrhoids 3 piles.  Prominent external hemorrhoids especially left anterior and right anterior.  DESCRIPTION:   Informed consent was confirmed. Patient underwent general anesthesia without difficulty. Patient was placed into prone positioning.  The perianal region was prepped and draped in sterile fashion. Surgical time-out confirmed our plan.  I did digital rectal examination and then transitioned over to anoscopy to get a sense of the anatomy.  I switched over to the [facility] fiberoptically lit Doppler anocope.   Using the Doppler on the tip of the [place] anoscope, I identified the arterial hemorrhoidal vessels coming in in the classic hexagonal anatomical pattern (right posterior/lateral/anterior, left posterior /lateral/anterior).    I proceeded to ligate the hemorrhoidal arteries. I used a 2-0 Vicryl suture on a UR-6 needle in a figure-of-eight fashion over the signal around 6 cm proximal to the anal verge. I then ran that stitch longitudinally more distally to the white line of [place]. I then tied that stitch  down to cause a hemorrhoidopexy. I did that for all 6 locations.    I redid Doppler anoscopy. Signals went away.  At completion of  this, there were still some hemorrhoidal tissue in the anal canal as well as external hemorrhoids in the left anterior right anterior aspect.  There are a few smaller ones right posterior and lateral as well along the anal canal were excised with cautery and closed with interrupted chromic suture.  The larger external hemorrhoids left anterior and right anterior were also excised with cautery.  Those wounds were closed with 2-0 chromic horizontal mattress interrupted sutures.  I did leave some opening externally on the left anterior and right anterior aspect psych drain.  There is no more prolapse. External anatomy looked normal.  I repeated anoscopy and examination.  Hemostasis was good.  Patient is being extubated go to recovery room.  I had discussed postop care in detail with the patient in the preop holding area.  Instructions are written.  I am about to discuss the patient's status to the family.     Adin Hector, M.D., F.A.C.S. Gastrointestinal and Minimally Invasive Surgery [facility] [address] [place], [place] [address] [phone] Main / Paging

## 2015-10-28 ENCOUNTER — Encounter (HOSPITAL_BASED_OUTPATIENT_CLINIC_OR_DEPARTMENT_OTHER): Payer: Self-pay | Admitting: Surgery

## 2015-11-15 ENCOUNTER — Ambulatory Visit (INDEPENDENT_AMBULATORY_CARE_PROVIDER_SITE_OTHER): Payer: Commercial Managed Care - HMO | Admitting: Internal Medicine

## 2015-11-15 ENCOUNTER — Encounter: Payer: Self-pay | Admitting: Internal Medicine

## 2015-11-15 VITALS — BP 105/71 | Temp 98.2°F

## 2015-11-15 DIAGNOSIS — B182 Chronic viral hepatitis C: Secondary | ICD-10-CM

## 2015-11-15 DIAGNOSIS — R7401 Elevation of levels of liver transaminase levels: Secondary | ICD-10-CM

## 2015-11-15 DIAGNOSIS — K7460 Unspecified cirrhosis of liver: Secondary | ICD-10-CM | POA: Diagnosis not present

## 2015-11-15 DIAGNOSIS — R740 Nonspecific elevation of levels of transaminase and lactic acid dehydrogenase [LDH]: Secondary | ICD-10-CM

## 2015-11-15 NOTE — Progress Notes (Signed)
   Subjective:    Patient ID: Stacy Thomas, female    DOB: 07-02-1960, 55 y.o.   MRN: [id number]  HPI Here for follow up of HCV.  Has genotype 1a, viral load 1.3 million, elastography F3/4.  Started on [place] in June and now on last bottle.  Early viral load is undetectable.  No complaints and pleased to be on the medication.  No weight loss, no diarrhea.  No fatigue.     Review of Systems  Constitutional: Negative for fatigue.  Gastrointestinal: Negative for diarrhea.  Skin: Negative for rash.  Neurological: Negative for headaches.       Objective:   Physical Exam  Constitutional: She appears well-developed and well-nourished. No distress.  Eyes: No scleral icterus.  Cardiovascular: Normal rate, regular rhythm and normal heart sounds.   No murmur heard. Skin: No rash noted.    Social History   Social History  . Marital status: Married    Spouse name: N/A  . Number of children: N/A  . Years of education: N/A   Occupational History  . Not on file.   Social History Main Topics  . Smoking status: Former Smoker    Packs/day: 1.00    Years: 37.00    Types: Cigarettes    Start date: 04/02/1973    Quit date: 12/19/2014  . Smokeless tobacco: Never Used  . Alcohol use No  . Drug use: No     Comment: recovery addict since 2007  . Sexual activity: Not on file   Other Topics Concern  . Not on file   Social History Narrative   Lives with husband in a one story home.  Has 3 children.  Does not work.    Education: some college.       Assessment & Plan:

## 2015-11-15 NOTE — Assessment & Plan Note (Signed)
Improved but not normalized.  Will continue to monitor

## 2015-11-15 NOTE — Assessment & Plan Note (Signed)
Will refer to GI for ? EGD.  Has previously seen Dr. Havery Moros.   Bee screening every 6 months, will arrange next visit.  I will do pneumovax next visit with hep B #3

## 2015-11-15 NOTE — Assessment & Plan Note (Signed)
Will get end of treatment lab in 4 weeks and rtc after that.

## 2015-11-18 ENCOUNTER — Other Ambulatory Visit: Payer: Self-pay | Admitting: Internal Medicine

## 2015-12-08 ENCOUNTER — Other Ambulatory Visit: Payer: Commercial Managed Care - HMO

## 2015-12-08 DIAGNOSIS — B182 Chronic viral hepatitis C: Secondary | ICD-10-CM

## 2015-12-12 LAB — HEPATITIS C RNA QUANTITATIVE: HCV QUANT: NOT DETECTED [IU]/mL (ref <15)

## 2015-12-15 ENCOUNTER — Ambulatory Visit (INDEPENDENT_AMBULATORY_CARE_PROVIDER_SITE_OTHER): Payer: Commercial Managed Care - HMO | Admitting: Internal Medicine

## 2015-12-15 ENCOUNTER — Encounter: Payer: Self-pay | Admitting: Internal Medicine

## 2015-12-15 VITALS — BP 149/92 | HR 76 | Temp 98.1°F | Wt 157.0 lb

## 2015-12-15 DIAGNOSIS — K7460 Unspecified cirrhosis of liver: Secondary | ICD-10-CM

## 2015-12-15 DIAGNOSIS — Z23 Encounter for immunization: Secondary | ICD-10-CM

## 2015-12-15 DIAGNOSIS — B182 Chronic viral hepatitis C: Secondary | ICD-10-CM

## 2015-12-15 NOTE — Assessment & Plan Note (Signed)
EOT viral load undetectable.  Will do SVR24 in 6 months and follow up with PharmD for test of cure

## 2015-12-15 NOTE — Addendum Note (Signed)
Addended by: Myrtis Hopping A on: 12/15/2015 09:58 AM   Modules accepted: Orders

## 2015-12-15 NOTE — Progress Notes (Signed)
   Subjective:    Patient ID: Stacy Thomas, female    DOB: 09-20-1960, 55 y.o.   MRN: [id number]  HPI Here for follow up of HCV.  Has genotype 1a, viral load 1.3 million, elastography F3/4.  Started on [place] in June and now completed.  Early viral load is undetectable.  No complaints and pleased to be on the medication.  No weight loss, no diarrhea.  No fatigue.  Has appt with GI in October.  No new issues. Hep B #3 next month.    Review of Systems  Constitutional: Negative for fatigue.  Gastrointestinal: Negative for diarrhea.  Skin: Negative for rash.  Neurological: Negative for headaches.       Objective:   Physical Exam  Constitutional: She appears well-developed and well-nourished. No distress.  Eyes: No scleral icterus.  Cardiovascular: Normal rate, regular rhythm and normal heart sounds.   No murmur heard. Skin: No rash noted.    Social History   Social History  . Marital status: Married    Spouse name: N/A  . Number of children: N/A  . Years of education: N/A   Occupational History  . Not on file.   Social History Main Topics  . Smoking status: Former Smoker    Packs/day: 1.00    Years: 37.00    Types: Cigarettes    Start date: 04/02/1973    Quit date: 12/19/2014  . Smokeless tobacco: Never Used  . Alcohol use No  . Drug use: No     Comment: recovery addict since 2007  . Sexual activity: Not on file   Other Topics Concern  . Not on file   Social History Narrative   Lives with husband in a one story home.  Has 3 children.  Does not work.    Education: some college.       Assessment & Plan:

## 2015-12-15 NOTE — Assessment & Plan Note (Addendum)
Will do [place] screening now and every 6 months.  Will order again after next visit with PharmD and then defer to PCP to do limited abdominal ultrasound every 6 months. Pneumovax today Has appt with GI in October to discuss EGD

## 2015-12-26 ENCOUNTER — Ambulatory Visit
Admission: RE | Admit: 2015-12-26 | Discharge: 2015-12-26 | Disposition: A | Discharge status: Home / Self Care | Payer: Commercial Managed Care - HMO | Source: Ambulatory Visit | Attending: Internal Medicine | Admitting: Internal Medicine

## 2015-12-26 DIAGNOSIS — K7460 Unspecified cirrhosis of liver: Secondary | ICD-10-CM

## 2015-12-26 DIAGNOSIS — Z23 Encounter for immunization: Secondary | ICD-10-CM

## 2015-12-30 ENCOUNTER — Telehealth: Payer: Self-pay | Admitting: *Deleted

## 2015-12-30 NOTE — Telephone Encounter (Signed)
Patient asking for interpretation of her ultrasound. Please advise. Landis Gandy, RN

## 2016-01-09 NOTE — Telephone Encounter (Signed)
Looks good, no concerns. thanks

## 2016-01-10 NOTE — Telephone Encounter (Signed)
Relayed information to patient. Thanks! 

## 2016-01-27 ENCOUNTER — Telehealth: Payer: Self-pay

## 2016-01-27 NOTE — Telephone Encounter (Signed)
-----   Message from Audrea Muscat, [place] sent at 08/08/2015  2:51 PM EDT ----- Patient due for 3rd hep b injection; original given 07/28/2015

## 2016-01-27 NOTE — Telephone Encounter (Signed)
Patient due for 3rd hep injection 01/28/2016.  She has office visit with Dr. Havery Moros scheduled for 01/30/2017 and will receive it.  Caryl Pina given this information.

## 2016-01-31 ENCOUNTER — Encounter: Payer: Self-pay | Admitting: Gastroenterology

## 2016-01-31 ENCOUNTER — Other Ambulatory Visit (INDEPENDENT_AMBULATORY_CARE_PROVIDER_SITE_OTHER): Payer: Commercial Managed Care - HMO

## 2016-01-31 ENCOUNTER — Ambulatory Visit (INDEPENDENT_AMBULATORY_CARE_PROVIDER_SITE_OTHER): Payer: Commercial Managed Care - HMO | Admitting: Gastroenterology

## 2016-01-31 VITALS — BP 100/70 | HR 64 | Ht 61.0 in | Wt 158.0 lb

## 2016-01-31 DIAGNOSIS — B1920 Unspecified viral hepatitis C without hepatic coma: Secondary | ICD-10-CM

## 2016-01-31 DIAGNOSIS — R932 Abnormal findings on diagnostic imaging of liver and biliary tract: Secondary | ICD-10-CM | POA: Diagnosis not present

## 2016-01-31 DIAGNOSIS — Z23 Encounter for immunization: Secondary | ICD-10-CM

## 2016-01-31 LAB — COMPREHENSIVE METABOLIC PANEL
ALBUMIN: 4.2 g/dL (ref 3.5–5.2)
ALT: 37 U/L — ABNORMAL HIGH (ref 0–35)
AST: 56 U/L — AB (ref 0–37)
Alkaline Phosphatase: 114 U/L (ref 39–117)
BUN: 17 mg/dL (ref 6–23)
CHLORIDE: 105 meq/L (ref 96–112)
CO2: 28 meq/L (ref 19–32)
CREATININE: 1.27 mg/dL — AB (ref 0.40–1.20)
Calcium: 10.1 mg/dL (ref 8.4–10.5)
GFR: 55.73 mL/min — ABNORMAL LOW (ref >60.00)
GLUCOSE: 71 mg/dL (ref 70–99)
Potassium: 4.1 mEq/L (ref 3.5–5.1)
SODIUM: 140 meq/L (ref 135–145)
Total Bilirubin: 0.5 mg/dL (ref 0.2–1.2)
Total Protein: 8.2 g/dL (ref 6.0–8.3)

## 2016-01-31 LAB — CBC WITH DIFFERENTIAL/PLATELET
BASOS PCT: 0.5 % (ref 0.0–3.0)
Basophils Absolute: 0 10*3/uL (ref 0.0–0.1)
EOS ABS: 0.1 10*3/uL (ref 0.0–0.7)
Eosinophils Relative: 1.7 % (ref 0.0–5.0)
HCT: 43.3 % (ref 36.0–46.0)
HEMOGLOBIN: 14.8 g/dL (ref 12.0–15.0)
Lymphocytes Relative: 55.4 % — ABNORMAL HIGH (ref 12.0–46.0)
Lymphs Abs: 4.1 10*3/uL — ABNORMAL HIGH (ref 0.7–4.0)
MCHC: 34.2 g/dL (ref 30.0–36.0)
MCV: 91.5 fl (ref 78.0–100.0)
MONO ABS: 1 10*3/uL (ref 0.1–1.0)
Monocytes Relative: 13.5 % — ABNORMAL HIGH (ref 3.0–12.0)
Neutro Abs: 2.1 10*3/uL (ref 1.4–7.7)
Neutrophils Relative %: 28.9 % — ABNORMAL LOW (ref 43.0–77.0)
Platelets: 201 10*3/uL (ref 150.0–400.0)
RBC: 4.74 Mil/uL (ref 3.87–5.11)
RDW: 13.1 % (ref 11.5–15.5)
WBC: 7.3 10*3/uL (ref 4.0–10.5)

## 2016-01-31 LAB — PROTIME-INR
INR: 1.2 ratio — AB (ref 0.8–1.0)
Prothrombin Time: 12.6 s (ref 9.6–13.1)

## 2016-01-31 NOTE — Progress Notes (Signed)
HPI :  55 y/o female here for a follow up visit. I saw her earlier this year for hemorrhoidal bleeding and diagnosis of hepatitis C. 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  She had been diagnosed with hepatitis C, earlier this year. She previously used IV drugs and smoked crack cocaine, this was > 10 years ago when she was last used. She had genotype 1A with ALT and AST elevated to low 100s at the time of diagnosis. She denies any alcohol use at all. She was referred to infectious diseases and seen by Dr Linus Salmons, who treated her with 12 weeks of Harvoni this past summer, after which she had a negative viral load. She had an [place] with elastography prior to therapy showing normal liver and spleen, with some F3/F4 fibrotic changes, at risk for cirrhosis. Platelet count normal. She had a follow up US of the liver this past September which was normal.    She otherwise was referred to general surgery, Dr Johney Maine, for grade IV prolapsed hemorrhoids. She had hemorrhoid surgery this past summer and has recovered well. These are no longer bothering her.   Colonoscopy 02/28/2009 - external / internal hemorrhoids, diverticulosis     Past Medical History:  Diagnosis Date  . Bilateral arm pain    chronic  . Chronic hepatitis C ([place]) followed by dr comer at infectious disease   Genotype 1a--  currently on Harvoni started 09-13-2015  . Dependence on crutches    left leg prosthesis  . Drug addiction in remission ([place])    since 2007  (crack cocaine,  IV drug use)  . External hemorrhoid, bleeding   . Hand weakness   . History of esophageal stricture    post dilation x2  . History of osteosarcoma    1982  left knee---  s/p  AKA LLE--   per pt no recurrence  . History of recreational drug use multiple rehab visits   IV drug use for 1 yr and Smoked crack for 30+yrs--  per pt clean since 2007  . Hyperlipidemia   . Hypertension   . Idiopathic  peripheral neuropathy   . Osteoarthritis   . Prolapsed internal hemorrhoids, grade 4   . Sigmoid diverticulosis    mild  . Wears glasses      Past Surgical History:  Procedure Laterality Date  . ABOVE KNEE LEG AMPUTATION Left 1982   osteosarcoma  . COLONOSCOPY  last one 02-28-2009  . ESOPHAGOGASTRODUODENOSCOPY (EGD) WITH ESOPHAGEAL DILATION  x2  last one 04-28-2008  . EXCISION BREAST BX  2015   CYST  . TONSILLECTOMY  as child  . TRANSANAL HEMORRHOIDAL DEARTERIALIZATION N/A 10/27/2015   Procedure: TRANSANAL HEMORRHOIDAL DEARTERIALIZATION;  Surgeon: Michael Boston, MD;  Location: [facility];  Service: General;  Laterality: N/A;   Family History  Problem Relation Age of Onset  . COPD Father   . Alcohol abuse Father     Deceased  . Hypertension Mother     Living  . Healthy Daughter   . Healthy Son   . Anesthesia problems Neg Hx    Social History  Substance Use Topics  . Smoking status: Former Smoker    Packs/day: 1.00    Years: 37.00    Types: Cigarettes    Start date: 04/02/1973    Quit date: 12/19/2014  . Smokeless tobacco: Never Used  . Alcohol use No   Current Outpatient Prescriptions  Medication Sig Dispense Refill  . gabapentin (NEURONTIN) 300 MG capsule Take 2 capsules (600 mg total) by mouth 3 (three) times daily. 180 capsule 5  . pravastatin (PRAVACHOL) 40 MG tablet take 1 tablet by mouth once daily (Patient taking differently: take 1 tablet by mouth once daily--- takes in pm) 30 tablet 2  . triamterene-hydrochlorothiazide (MAXZIDE-25) 37.5-25 MG tablet take 1 tablet by mouth once daily 90 tablet 0   No current facility-administered medications for this visit.    Allergies  Allergen Reactions  . Morphine Swelling    REACTION: face swells  . Codeine Other (See Comments)    Avoids due to being recovery addict     Review of Systems: All systems reviewed and negative except where noted in HPI.     Physical Exam: BP 100/70 (BP Location: Left  Arm, Patient Position: Sitting, Cuff Size: Normal)   Pulse 64   Ht 5\' 1"  (1.549 m)   Wt 158 lb (71.7 kg)   LMP 12/02/2010   BMI 29.85 kg/m  Constitutional: Pleasant,well-developed, female in no acute distress. HEENT: Normocephalic and atraumatic. Conjunctivae are normal. No scleral icterus. Neck supple.  Cardiovascular: Normal rate, regular rhythm.  Pulmonary/chest: Effort normal and breath sounds normal. Abdominal: Soft, nondistended, nontender. There are no masses palpable. No hepatomegaly. Extremities: no edema, prosthetic left leg Lymphadenopathy: No cervical adenopathy noted. Neurological: Alert and oriented to person place and time. Skin: Skin is warm and dry. No rashes noted. Psychiatric: Normal mood and affect. Behavior is normal.   ASSESSMENT AND PLAN: 55 y/o female with history of hepatitis C of unknown duration, treated within the past year with Harvoni with good response. Her US liver showed normal appearing liver and normal spleen size, but with elastography showing F3/F4 changes raising the question of cirrhosis. Her platelet count and INR have historically been normal. Overall I think it is less likely that she has cirrhosis given findings to date, although it remains possible. We discussed most definitive way to diagnosis cirrhosis would be with a liver biopsy, but after discussion of this procedure and risks / benefits she did not want to proceed at this time which is reasonable. At this time I will repeat basic labs with CBC, LFTs, INR, and check AFP to ensure normal. We will consider repeating another [place] in 6 months. Otherwise, we discussed potential EGD to screen for varices if she has cirrhosis, however will hold off on this for now as cirrhosis seems less likely and she wishes to avoid further invasive testing if possible. Final HBV vaccine due today otherwise.  Hemorrhoids otherwise no longer bothering her after surgical therapy. She is due for recall screening  colonoscopy in 2020.   She should f/u in 6 months if labs otherwise stable.   [place] Cellar, MD [facility] Pager [phone]

## 2016-01-31 NOTE — Patient Instructions (Signed)
You have  been given your last Hepatitis B injection today. Your physician has requested that you go to the basement for lab work before leaving today.

## 2016-02-01 LAB — AFP TUMOR MARKER: AFP-Tumor Marker: 8.6 ng/mL — ABNORMAL HIGH (ref <6.1)

## 2016-02-02 ENCOUNTER — Other Ambulatory Visit: Payer: Self-pay

## 2016-02-02 DIAGNOSIS — R7989 Other specified abnormal findings of blood chemistry: Secondary | ICD-10-CM

## 2016-02-02 DIAGNOSIS — R945 Abnormal results of liver function studies: Principal | ICD-10-CM

## 2016-02-06 ENCOUNTER — Ambulatory Visit (HOSPITAL_COMMUNITY)
Admission: RE | Admit: 2016-02-06 | Discharge: 2016-02-06 | Disposition: A | Discharge status: Home / Self Care | Payer: Commercial Managed Care - HMO | Source: Ambulatory Visit | Attending: Gastroenterology | Admitting: Gastroenterology

## 2016-02-06 DIAGNOSIS — I700 Atherosclerosis of aorta: Secondary | ICD-10-CM | POA: Insufficient documentation

## 2016-02-06 DIAGNOSIS — I708 Atherosclerosis of other arteries: Secondary | ICD-10-CM | POA: Diagnosis not present

## 2016-02-06 DIAGNOSIS — R7989 Other specified abnormal findings of blood chemistry: Secondary | ICD-10-CM | POA: Insufficient documentation

## 2016-02-06 DIAGNOSIS — K7460 Unspecified cirrhosis of liver: Secondary | ICD-10-CM | POA: Diagnosis not present

## 2016-02-06 DIAGNOSIS — K8689 Other specified diseases of pancreas: Secondary | ICD-10-CM | POA: Insufficient documentation

## 2016-02-06 DIAGNOSIS — R945 Abnormal results of liver function studies: Secondary | ICD-10-CM

## 2016-02-06 MED ORDER — IOPAMIDOL (ISOVUE-300) INJECTION 61%
100.0000 mL | Freq: Once | INTRAVENOUS | Status: AC | PRN
  Administered 2016-02-06: 100 mL via INTRAVENOUS

## 2016-02-08 ENCOUNTER — Telehealth: Payer: Self-pay | Admitting: Gastroenterology

## 2016-02-08 NOTE — Telephone Encounter (Signed)
Spoke to patient, let her know that Dr. Havery Moros has not yet reviewed the results and as soon as he had, we would contact her.

## 2016-02-09 ENCOUNTER — Telehealth: Payer: Self-pay | Admitting: Gastroenterology

## 2016-02-09 NOTE — Telephone Encounter (Signed)
Just sent a result note to you with details to relay results. thanks

## 2016-02-09 NOTE — Telephone Encounter (Signed)
Patient called yesterday and today looking for her CT results. Thank you.

## 2016-03-02 ENCOUNTER — Other Ambulatory Visit: Payer: Self-pay | Admitting: Internal Medicine

## 2016-03-05 ENCOUNTER — Ambulatory Visit (INDEPENDENT_AMBULATORY_CARE_PROVIDER_SITE_OTHER): Payer: Commercial Managed Care - HMO | Admitting: Internal Medicine

## 2016-03-05 ENCOUNTER — Encounter: Payer: Self-pay | Admitting: Gastroenterology

## 2016-03-05 ENCOUNTER — Encounter: Payer: Self-pay | Admitting: Internal Medicine

## 2016-03-05 VITALS — BP 101/69 | HR 100 | Temp 98.7°F | Ht 61.0 in | Wt 158.4 lb

## 2016-03-05 DIAGNOSIS — Z01419 Encounter for gynecological examination (general) (routine) without abnormal findings: Secondary | ICD-10-CM

## 2016-03-05 DIAGNOSIS — Q453 Other congenital malformations of pancreas and pancreatic duct: Secondary | ICD-10-CM | POA: Insufficient documentation

## 2016-03-05 DIAGNOSIS — Z79899 Other long term (current) drug therapy: Secondary | ICD-10-CM

## 2016-03-05 DIAGNOSIS — S78119A Complete traumatic amputation at level between unspecified hip and knee, initial encounter: Secondary | ICD-10-CM

## 2016-03-05 DIAGNOSIS — I10 Essential (primary) hypertension: Secondary | ICD-10-CM | POA: Insufficient documentation

## 2016-03-05 DIAGNOSIS — I739 Peripheral vascular disease, unspecified: Secondary | ICD-10-CM

## 2016-03-05 DIAGNOSIS — N941 Unspecified dyspareunia: Secondary | ICD-10-CM

## 2016-03-05 DIAGNOSIS — Z780 Asymptomatic menopausal state: Secondary | ICD-10-CM | POA: Diagnosis not present

## 2016-03-05 LAB — GLUCOSE, CAPILLARY: Glucose-Capillary: 84 mg/dL (ref 65–99)

## 2016-03-05 LAB — POCT GLYCOSYLATED HEMOGLOBIN (HGB A1C): HEMOGLOBIN A1C: 5.4

## 2016-03-05 MED ORDER — ASPIRIN EC 81 MG PO TBEC: 81.0000 mg | DELAYED_RELEASE_TABLET | Freq: Every day | ORAL | 2 refills | Status: DC

## 2016-03-05 MED ORDER — ATORVASTATIN CALCIUM 40 MG PO TABS: 40.0000 mg | ORAL_TABLET | Freq: Every day | ORAL | 11 refills | Status: DC

## 2016-03-05 NOTE — Progress Notes (Signed)
CC: HTN f/up, asking for motorized wheel chair, and review imaging results   HPI:  [name] is a 55 y.o. with PMH as listed below is here for f/up on HTN, asking for motorized wheel chair   Past Medical History:  Diagnosis Date  . Bilateral arm pain    chronic  . Chronic hepatitis C ([place]) followed by dr Stacy Thomas at infectious disease   Genotype 1a--  currently on Harvoni started 09-13-2015  . Dependence on crutches    left leg prosthesis  . Drug addiction in remission ([place])    since 2007  (crack cocaine,  IV drug use)  . External hemorrhoid, bleeding   . Hand weakness   . History of esophageal stricture    post dilation x2  . History of osteosarcoma    1982  left knee---  s/p  AKA LLE--   per pt no recurrence  . History of recreational drug use multiple rehab visits   IV drug use for 1 yr and Smoked crack for 30+yrs--  per pt clean since 2007  . Hyperlipidemia   . Hypertension   . Idiopathic peripheral neuropathy   . Osteoarthritis   . Prolapsed internal hemorrhoids, grade 4   . Sigmoid diverticulosis    mild  . Wears glasses    HTN: BP well controlled on maxzide 37.5-25mg .   PVD: See on CT 02/06/16: high-grade stenosis of the distal left common iliac artery. Has left AKA around 1983 for osteosarcoma. Having some pain on the stump occasionally. No abdominal pain. Has left leg prosthesis.   Has been using crutches due to her AKA since 1983 and gets pain on her arms and hands from using the crutches. Also has a manual wheel chair which she cannot use because of the pain. Asking for motorized wheel chair. I was planning to send her to PT for evaluation but then she stated that she will bring this up with Stacy Thomas during there upcoming appt.    Review of Systems:    Review of Systems  Constitutional: Negative for chills and fever.  Cardiovascular: Negative for chest pain.  Gastrointestinal: Negative for heartburn and nausea.  Musculoskeletal: Positive for joint  pain. Negative for back pain and neck pain.  Neurological: Negative for dizziness and headaches.     Physical Exam:  Physical Exam  Constitutional: She is oriented to person, place, and time. She appears well-developed and well-nourished. No distress.  HENT:  Head: Normocephalic and atraumatic.  Eyes: Conjunctivae are normal. Pupils are equal, round, and reactive to light.  Neck: Normal range of motion.  Cardiovascular: Exam reveals no gallop and no friction rub.   No murmur heard. Respiratory: Effort normal and breath sounds normal. No respiratory distress. She has no wheezes.  GI: Soft. Bowel sounds are normal. She exhibits no distension.  Musculoskeletal: Normal range of motion. She exhibits no edema.  Has left AKA wearing prosthesis. Stump is well perfused. No drainage. No pain.  Neurological: She is alert and oriented to person, place, and time. No cranial nerve deficit.  Skin: She is not diaphoretic.  Psychiatric: She has a normal mood and affect.   Vitals:   03/05/16 1425  BP: 101/69  Pulse: 100  Temp: 98.7 F (37.1 C)  TempSrc: Oral  SpO2: 100%  Weight: 158 lb 6.4 oz (71.8 kg)  Height: 5\' 1"  (1.549 m)    Assessment & Plan:   See Encounters Tab for problem based charting.  Patient discussed with Stacy Thomas

## 2016-03-05 NOTE — Assessment & Plan Note (Addendum)
Has been using crutches due to her AKA since 1983 and gets pain on her arms and hands from using the crutches. Also has a manual wheel chair which she cannot use because of the pain. Asking for motorized wheel chair. She at the end states that she will bring this up with Dr. Sharol Given. She asked me to defer the PT evaluation at this time.

## 2016-03-05 NOTE — Assessment & Plan Note (Signed)
BP well controlled on maxzide 37.5-25mg . Cont this.

## 2016-03-05 NOTE — Patient Instructions (Signed)
Start taking 81 mg daily aspirin.  Changing your cholesterol medicine to LIpitor 40mg  daily.  Will check your blood work.  Will ask physical therapy to evaluate you for powered Wheel chair.

## 2016-03-05 NOTE — Assessment & Plan Note (Addendum)
on CT 02/06/16: high-grade stenosis of thedistal left common iliac artery. Not on asa currently. Taking pravachol 40mg  daily.   She is asymptomatic. Not having claudication. Left stump appears well perfused.   Will change to lipitor 40mg  daily and add 81 mg asa. No referral needed at this time to vascular surgery. Will check lipid panel and hgba1c.

## 2016-03-05 NOTE — Assessment & Plan Note (Signed)
Indeterminate lesion in the uncinate process of the pancreas measuring 1.5 x 1.0 x 1.3 cm, favored to represent a mildly proteinaceous pancreatic pseudocyst. Recommend follow up pre and post contrast MRI/MRCP or pancreatic protocol CT in 1 year

## 2016-03-06 ENCOUNTER — Ambulatory Visit (INDEPENDENT_AMBULATORY_CARE_PROVIDER_SITE_OTHER): Payer: Commercial Managed Care - HMO | Admitting: Pulmonary Disease

## 2016-03-06 VITALS — BP 125/77 | HR 77 | Temp 97.5°F | Ht 61.0 in | Wt 158.7 lb

## 2016-03-06 DIAGNOSIS — Z01419 Encounter for gynecological examination (general) (routine) without abnormal findings: Secondary | ICD-10-CM

## 2016-03-06 DIAGNOSIS — Z124 Encounter for screening for malignant neoplasm of cervix: Secondary | ICD-10-CM | POA: Insufficient documentation

## 2016-03-06 DIAGNOSIS — I739 Peripheral vascular disease, unspecified: Secondary | ICD-10-CM | POA: Diagnosis not present

## 2016-03-06 DIAGNOSIS — N941 Unspecified dyspareunia: Secondary | ICD-10-CM | POA: Diagnosis not present

## 2016-03-06 DIAGNOSIS — N952 Postmenopausal atrophic vaginitis: Secondary | ICD-10-CM | POA: Insufficient documentation

## 2016-03-06 DIAGNOSIS — N958 Other specified menopausal and perimenopausal disorders: Secondary | ICD-10-CM

## 2016-03-06 LAB — LIPID PANEL
CHOL/HDL RATIO: 4.5 ratio — AB (ref 0.0–4.4)
Cholesterol, Total: 207 mg/dL — ABNORMAL HIGH (ref 100–199)
HDL: 46 mg/dL (ref >39)
LDL Calculated: 125 mg/dL — ABNORMAL HIGH (ref 0–99)
Triglycerides: 182 mg/dL — ABNORMAL HIGH (ref 0–149)
VLDL CHOLESTEROL CAL: 36 mg/dL (ref 5–40)

## 2016-03-06 MED ORDER — ESTRADIOL 0.1 MG/GM VA CREA: 1.0000 | TOPICAL_CREAM | Freq: Every day | VAGINAL | 0 refills | Status: DC

## 2016-03-06 MED ORDER — TRIAMTERENE-HCTZ 37.5-25 MG PO TABS: 1.0000 | ORAL_TABLET | Freq: Every day | ORAL | 0 refills | Status: DC

## 2016-03-06 MED ORDER — ATORVASTATIN CALCIUM 40 MG PO TABS: 40.0000 mg | ORAL_TABLET | Freq: Every day | ORAL | 11 refills | Status: DC

## 2016-03-06 MED ORDER — ASPIRIN EC 81 MG PO TBEC: 81.0000 mg | DELAYED_RELEASE_TABLET | Freq: Every day | ORAL | 2 refills | Status: DC

## 2016-03-06 NOTE — Assessment & Plan Note (Signed)
Resent medications per patient request to different pharmacy

## 2016-03-06 NOTE — Assessment & Plan Note (Signed)
PAP smear done.  

## 2016-03-06 NOTE — Patient Instructions (Addendum)
Use vaginal moisturizing agents regularly, and use vaginal lubricants for sexual intercourse.  Vaginal moisturizers can be used one or more times per week, not just during sexual activity. Examples are Replens, Me Again, Vagisil Feminine Moisturizer, Feminease, and K-Y SILK-E.  You may use Estrace (estradiol) prescription to help with the dryness as well.  Please follow up with your primary care as previous recommended.

## 2016-03-06 NOTE — Progress Notes (Signed)
   CC: pain during sex  HPI:  [name] is a 55 y.o. woman presenting for evaluation of pain during sex and for pap smear.  Last had Pap smear 5 years ago. She has gone through menopause. Started at age 28.  She has pain during intercourse. It is located deep inside. It has been about a year. Occurs with some intercourse. She tries vaginal cream that helps. No pain during foreplay. Occurs with first penetration. No pain after sex or at other nonsexual times. She feels safe in her current relationship.    Past Medical History:  Diagnosis Date  . Bilateral arm pain    chronic  . Chronic hepatitis C ([place]) followed by dr comer at infectious disease   Genotype 1a--  currently on Harvoni started 09-13-2015  . Dependence on crutches    left leg prosthesis  . Drug addiction in remission ([place])    since 2007  (crack cocaine,  IV drug use)  . External hemorrhoid, bleeding   . Hand weakness   . History of esophageal stricture    post dilation x2  . History of osteosarcoma    1982  left knee---  s/p  AKA LLE--   per pt no recurrence  . History of recreational drug use multiple rehab visits   IV drug use for 1 yr and Smoked crack for 30+yrs--  per pt clean since 2007  . Hyperlipidemia   . Hypertension   . Idiopathic peripheral neuropathy   . Osteoarthritis   . Prolapsed internal hemorrhoids, grade 4   . Sigmoid diverticulosis    mild  . Wears glasses     Review of Systems:   No fevers or chills No chest pain No dyspnea  Physical Exam:  Vitals:   03/06/16 0931  BP: 125/77  Pulse: 77  Temp: 97.5 F (36.4 C)  TempSrc: Oral  SpO2: 97%  Weight: 158 lb 11.2 oz (72 kg)  Height: 5\' 1"  (1.549 m)   General Apperance: NAD HEENT: Normocephalic, atraumatic, anicteric sclera Neck: Supple, trachea midline Lungs: Clear to auscultation bilaterally. No wheezes, rhonchi or rales. Breathing comfortably Heart: Regular rate and rhythm, no murmur/rub/gallop Abdomen: Soft,  nontender, nondistended, no rebound/guarding GU: No external or internal lesions appreciated. No discharge. Extremities: Warm and well perfused, no edema Skin: No rashes or lesions Neurologic: Alert and interactive. No gross deficits.   Assessment & Plan:   See Encounters Tab for problem based charting.  Patient discussed with Dr. Lynnae January

## 2016-03-06 NOTE — Assessment & Plan Note (Signed)
Assessment: Intermittent painful intercourse shortly after penetration that is relieved by lubricants for the past year. She is post menopausal. This is most consistent with atrophic vaginitis.  Plan:  -Lubricants during sexual intercourse -Vaginal moisturizer regularly -She can use Estradiol vaginal cream

## 2016-03-07 NOTE — Progress Notes (Signed)
Internal Medicine Clinic Attending  Case discussed with Dr. Krall at the time of the visit.  We reviewed the resident's history and exam and pertinent patient test results.  I agree with the assessment, diagnosis, and plan of care documented in the resident's note.  

## 2016-03-08 ENCOUNTER — Telehealth: Payer: Self-pay

## 2016-03-08 LAB — CYTOLOGY - PAP
Diagnosis: NEGATIVE
HPV (WINDOPATH): NOT DETECTED

## 2016-03-08 NOTE — Telephone Encounter (Signed)
Pt 's request sent to Dr Randell Patient who preformed PAP smear also Dr Genene Churn ,PCP.

## 2016-03-08 NOTE — Telephone Encounter (Signed)
Requesting pap smear result.

## 2016-03-09 NOTE — Telephone Encounter (Signed)
Negative PAP smear. Discussed results with patient. Thanks!

## 2016-03-12 ENCOUNTER — Encounter (INDEPENDENT_AMBULATORY_CARE_PROVIDER_SITE_OTHER): Payer: Self-pay | Admitting: Orthopedic Surgery

## 2016-03-12 ENCOUNTER — Telehealth (INDEPENDENT_AMBULATORY_CARE_PROVIDER_SITE_OTHER): Payer: Self-pay | Admitting: Orthopedic Surgery

## 2016-03-12 ENCOUNTER — Encounter (INDEPENDENT_AMBULATORY_CARE_PROVIDER_SITE_OTHER): Payer: Self-pay

## 2016-03-12 ENCOUNTER — Ambulatory Visit (INDEPENDENT_AMBULATORY_CARE_PROVIDER_SITE_OTHER): Payer: Commercial Managed Care - HMO | Admitting: Orthopedic Surgery

## 2016-03-12 VITALS — Ht 61.0 in | Wt 158.0 lb

## 2016-03-12 DIAGNOSIS — G5603 Carpal tunnel syndrome, bilateral upper limbs: Secondary | ICD-10-CM

## 2016-03-12 DIAGNOSIS — M25552 Pain in left hip: Secondary | ICD-10-CM

## 2016-03-12 DIAGNOSIS — M545 Low back pain, unspecified: Secondary | ICD-10-CM

## 2016-03-12 DIAGNOSIS — M1711 Unilateral primary osteoarthritis, right knee: Secondary | ICD-10-CM | POA: Diagnosis not present

## 2016-03-12 MED ORDER — METHYLPREDNISOLONE ACETATE 40 MG/ML IJ SUSP
40.0000 mg | INTRAMUSCULAR | Status: AC | PRN
  Administered 2016-03-12: 40 mg via INTRA_ARTICULAR

## 2016-03-12 MED ORDER — LIDOCAINE HCL 1 % IJ SOLN
5.0000 mL | INTRAMUSCULAR | Status: AC | PRN
  Administered 2016-03-12: 5 mL

## 2016-03-12 NOTE — Telephone Encounter (Signed)
Patient called advised [employer] is asking Dr Sharol Given to complete a auth. form in order for her to get a motor scooter.  The number to contact patient is [phone]

## 2016-03-12 NOTE — Addendum Note (Signed)
Addended by: Cleda Clarks on: 03/12/2016 11:56 AM   Modules accepted: Orders

## 2016-03-12 NOTE — Progress Notes (Signed)
Office Visit Note   Patient: Stacy Thomas           Date of Birth: 11/06/1960           MRN: [id number] Visit Date: 03/12/2016              Requested by: Dellia Nims, MD [address] [place], [place] [address] PCP: Dellia Nims, MD   Assessment & Plan: Visit Diagnoses:  1. Unilateral primary osteoarthritis, right knee   2. Pain in left hip   3. Pain of lumbar spine   4. Carpal tunnel syndrome, bilateral     Plan: Patient has multiple problems. We will inject her right knee for the osteoarthritis. Patient does have right hip pain with range of motion the hip and does have right lumbar spine pain as well. We'll have her take 2 Aleve twice a day to see if this will help with her hip and back symptoms.   Plan to obtain two-view radiographs of lumbar spine and 2 view radiographs of the right hip at follow-up.  Patient has been using loss strand crutches and has been developing numbness and tingling in her fingers with using the crutches. We will set her up for a nerve conduction studies to rule out carpal tunnel syndrome in both wrists of note she complains of numbness and tingling in all of her fingers.  Patient also will require a wheelchair for her activities of daily living. Patient states she wants [place] to contact the insurance company to approve the wheelchair. Recommended that the patient call the insurance company set up appear to appear so that this is an item that they would be willing to review for payment.  Recommended that she wear her medical compression stockings for the venous stasis swelling the right leg.   Follow-Up Instructions: Return in about 4 weeks (around 04/09/2016).   Orders:  Orders Placed This Encounter  Procedures  . Large Joint Injection/Arthrocentesis   No orders of the defined types were placed in this encounter.     Procedures: Large Joint Inj Date/Time: 03/12/2016 11:17 AM Performed by: DUDA, MARCUS V Authorized by: Newt Minion   Consent  Given by:  Patient Site marked: the procedure site was marked   Timeout: prior to procedure the correct patient, procedure, and site was verified   Indications:  Pain and diagnostic evaluation Location:  Knee Site:  R knee Prep: patient was prepped and draped in usual sterile fashion   Needle Size:  22 G Needle Length:  1.5 inches Approach:  Anterolateral Ultrasound Guidance: No   Fluoroscopic Guidance: No   Arthrogram: No   Medications:  5 mL lidocaine 1 %; 40 mg methylPREDNISolone acetate 40 MG/ML Aspiration Attempted: No   Patient tolerance:  Patient tolerated the procedure well with no immediate complications      Clinical Data: No additional findings.   Subjective: Chief Complaint  Patient presents with  . Right Knee - Pain    Right knee osteoarthritis     Patient presents for right knee pain. There is giving way, there is pain, she is having difficulty walking. She requests an injection today.     Review of Systems   Objective: Vital Signs: Ht 5\' 1"  (1.549 m)   Wt 158 lb (71.7 kg)   LMP 12/02/2010   BMI 29.85 kg/m   Physical Exam On examination patient is alert oriented no adenopathy well-dressed normal affect normal respiratory effort she ambulates with a prosthetic leg crutches and  wheelchair. Examination the right knee she has crepitation with range of motion classic resector stable she is tender to palpation of the medial joint line as well as patellofemoral joint. There is a mild effusion there is no cellulitis no redness no signs of infection no clinical signs of gout. Examination she does have some pain with range of motion of the right hip she has negative straight leg raise there is no focal motor weakness in the right lower extremity she also has some venous stasis swelling in the right leg and recommended she wear her compression stockings. Examination of her hand she does have some weakness with grip strength bilaterally. She is subjectively numb in  all fingers and the Phalen's and Tinel's tests are equivocal.  Ortho Exam  Specialty Comments:  No specialty comments available.  Imaging: No results found.   PMFS History: Patient Active Problem List   Diagnosis Date Noted  . Atrophic vaginitis 03/06/2016  . Screening for cervical cancer 03/06/2016  . PVD (peripheral vascular disease) ([place]) 03/05/2016  . Pancreatic abnormality 03/05/2016  . HTN (hypertension) 03/05/2016  . Hepatic cirrhosis ([place]) 11/15/2015  . External hemorrhoids with pain & bleeding 10/27/2015  . Chronic hepatitis C ([place]) 06/15/2015  . Transaminitis 06/14/2015  . Prolapsed internal hemorrhoids, grade 4 06/14/2015  . Peripheral neuropathy ([place]) 06/14/2015  . Unilateral AKA ([place])- left 06/14/2015  . Hx of osteosarcoma 06/14/2015  . DYSPHAGIA 06/13/2007  . ABDOMINAL PAIN-LUQ 06/13/2007   Past Medical History:  Diagnosis Date  . Bilateral arm pain    chronic  . Chronic hepatitis C ([place]) followed by dr comer at infectious disease   Genotype 1a--  currently on Harvoni started 09-13-2015  . Dependence on crutches    left leg prosthesis  . Drug addiction in remission ([place])    since 2007  (crack cocaine,  IV drug use)  . External hemorrhoid, bleeding   . Hand weakness   . History of esophageal stricture    post dilation x2  . History of osteosarcoma    1982  left knee---  s/p  AKA LLE--   per pt no recurrence  . History of recreational drug use multiple rehab visits   IV drug use for 1 yr and Smoked crack for 30+yrs--  per pt clean since 2007  . Hyperlipidemia   . Hypertension   . Idiopathic peripheral neuropathy   . Osteoarthritis   . Prolapsed internal hemorrhoids, grade 4   . Sigmoid diverticulosis    mild  . Wears glasses     Family History  Problem Relation Age of Onset  . COPD Father   . Alcohol abuse Father     Deceased  . Hypertension Mother     Living  . Healthy Daughter   . Healthy Son   . Anesthesia problems Neg Hx     Past  Surgical History:  Procedure Laterality Date  . ABOVE KNEE LEG AMPUTATION Left 1982   osteosarcoma  . COLONOSCOPY  last one 02-28-2009  . ESOPHAGOGASTRODUODENOSCOPY (EGD) WITH ESOPHAGEAL DILATION  x2  last one 04-28-2008  . EXCISION BREAST BX  2015   CYST  . TONSILLECTOMY  as child  . TRANSANAL HEMORRHOIDAL DEARTERIALIZATION N/A 10/27/2015   Procedure: TRANSANAL HEMORRHOIDAL DEARTERIALIZATION;  Surgeon: Michael Boston, MD;  Location: [facility];  Service: General;  Laterality: N/A;   Social History   Occupational History  . Not on file.   Social History Main Topics  . Smoking status: Former Smoker  Packs/day: 1.00    Years: 37.00    Types: Cigarettes    Start date: 04/02/1973    Quit date: 12/19/2014  . Smokeless tobacco: Never Used     Comment: quit over a year.  . Alcohol use No  . Drug use: No     Comment: recovery addict since 2007  . Sexual activity: Not on file

## 2016-03-13 NOTE — Progress Notes (Signed)
Internal Medicine Clinic Attending  Case discussed with Dr. Ahmed soon after the resident saw the patient.  We reviewed the resident's history and exam and pertinent patient test results.  I agree with the assessment, diagnosis, and plan of care documented in the resident's note. 

## 2016-03-13 NOTE — Telephone Encounter (Signed)
I called to advise patient that we are typically given something by patients insurance company or motorized wheelchair vendor. Asked she please call them and ask them to fax necessary paperwork needed for this and we be more than happy to fill it out but this is not something we have at our disposable in the office. She is going to call back.

## 2016-03-13 NOTE — Telephone Encounter (Signed)
[employer] [phone]. They need PA and peer to peer, [facility] needs you to call and tell them this info. Pt stated they did not want to hear it from her. Pt also stated she needed this done today because of her deductible.

## 2016-03-14 NOTE — Telephone Encounter (Signed)
I called pt to advise that first she must select a company that she wants to order the power chair from. Then we set up a physical therapy evaluation and then a face to face with Dr. Sharol Given they will fax over paperwork that needs to be completed and then we can submit to the insurance company. If the denial comes back a peer to peer will be requested but this is the process. I also advised the pt that a lot of times that insurance will only approve one or the other either a prosthetic limb or a motorized wheelchair and I explained the reasons the prosthetic companies have given to [place] for this. Also there has to be a reason why the pt is not able to self propel in a manual chair like heart, lung or orthopedic issues aside from the amputation. We are welcome to help any way that we can

## 2016-03-23 ENCOUNTER — Telehealth (INDEPENDENT_AMBULATORY_CARE_PROVIDER_SITE_OTHER): Payer: Self-pay | Admitting: Orthopedic Surgery

## 2016-03-23 ENCOUNTER — Encounter (INDEPENDENT_AMBULATORY_CARE_PROVIDER_SITE_OTHER): Payer: Self-pay | Admitting: Physical Medicine and Rehabilitation

## 2016-03-23 ENCOUNTER — Ambulatory Visit (INDEPENDENT_AMBULATORY_CARE_PROVIDER_SITE_OTHER): Payer: Commercial Managed Care - HMO | Admitting: Physical Medicine and Rehabilitation

## 2016-03-23 DIAGNOSIS — R202 Paresthesia of skin: Secondary | ICD-10-CM | POA: Diagnosis not present

## 2016-03-23 NOTE — Telephone Encounter (Signed)
Patient came in for nerve conduction studies with Dr. Ernestina Patches, after visit she was requesting a note saying that she is disabled and needs her mailbox outside of her front door and not in the yard. She needs this letter to take to the post office. Ok to write the note?

## 2016-03-23 NOTE — Progress Notes (Signed)
Stacy Thomas - 55 y.o. female MRN [id number]  Date of birth: 10-16-1960  Office Visit Note: Visit Date: 03/23/2016 PCP: Dellia Nims, MD Referred by: Dellia Nims, MD  Subjective: Chief Complaint  Patient presents with  . Right Hand - Numbness  . Left Hand - Numbness   HPI: Stacy Thomas is a 55 year old left-hand dominant female who reports chronic years with worsening pain, numbness, and burning in both hands. Right hand is worse. Second, third, and fourth fingers are worse. Stiffness in hand in the mornings. Has been dropping things more often. Symptoms have been present for about two years and have gotten worse. She denies any frank radicular symptoms or specific trauma. She does feel like she is dropping objects and does have severe pain and numbness. She has not had prior electrodiagnostic studies.    ROS Otherwise per HPI.  Assessment & Plan: Visit Diagnoses:  1. Paresthesia of skin     Plan: Findings:  Impression: The above electrodiagnostic study is ABNORMAL and reveals evidence of a moderate bilateral median nerve entrapment at the wrist (carpal tunnel syndrome) affecting sensory and motor components. The above electrodiagnostic study reveals evidence of peripheral neuropathy of bilateral upper extremities.   Recommendations: 1.  Follow-up with referring physician. 2.  Continue current management of symptoms. 3.  Suggest surgical evaluation.     Meds & Orders: No orders of the defined types were placed in this encounter.   Orders Placed This Encounter  Procedures  . NCV with EMG (electromyography)    Follow-up: No Follow-up on file.   Procedures: No procedures performed  No notes on file   Clinical History: No specialty comments available.  She reports that she quit smoking about 15 months ago. Her smoking use included Cigarettes. She started smoking about 43 years ago. She has a 37.00 pack-year smoking history. She has never used smokeless tobacco.    Recent Labs  03/05/16 1548  HGBA1C 5.4    Objective:  VS:  HT:    WT:   BMI:     BP:   HR: bpm  TEMP: ( )  RESP:  Physical Exam  Musculoskeletal:  Examination of the hand shows no swelling or synovitis. There is some osteophytic changes particularly at the [facility] joint. There is a positive Phalen's bilaterally. There is a negative Tinel's. There is intact sensation to light touch. There is full 5 out of 5 strength.    Ortho Exam Imaging: No results found.  Past Medical/Family/Surgical/Social History: Medications & Allergies reviewed per EMR Patient Active Problem List   Diagnosis Date Noted  . Paresthesia of skin 03/23/2016  . Atrophic vaginitis 03/06/2016  . Screening for cervical cancer 03/06/2016  . PVD (peripheral vascular disease) ([place]) 03/05/2016  . Pancreatic abnormality 03/05/2016  . HTN (hypertension) 03/05/2016  . Hepatic cirrhosis ([place]) 11/15/2015  . External hemorrhoids with pain & bleeding 10/27/2015  . Chronic hepatitis C ([place]) 06/15/2015  . Transaminitis 06/14/2015  . Prolapsed internal hemorrhoids, grade 4 06/14/2015  . Peripheral neuropathy ([place]) 06/14/2015  . Unilateral AKA ([place])- left 06/14/2015  . Hx of osteosarcoma 06/14/2015  . DYSPHAGIA 06/13/2007  . ABDOMINAL PAIN-LUQ 06/13/2007   Past Medical History:  Diagnosis Date  . Bilateral arm pain    chronic  . Chronic hepatitis C ([place]) followed by dr comer at infectious disease   Genotype 1a--  currently on Harvoni started 09-13-2015  . Dependence on crutches    left leg prosthesis  . Drug addiction in remission ([place])  since 2007  (crack cocaine,  IV drug use)  . External hemorrhoid, bleeding   . Hand weakness   . History of esophageal stricture    post dilation x2  . History of osteosarcoma    1982  left knee---  s/p  AKA LLE--   per pt no recurrence  . History of recreational drug use multiple rehab visits   IV drug use for 1 yr and Smoked crack for 30+yrs--  per pt clean since 2007  .  Hyperlipidemia   . Hypertension   . Idiopathic peripheral neuropathy   . Osteoarthritis   . Prolapsed internal hemorrhoids, grade 4   . Sigmoid diverticulosis    mild  . Wears glasses    Family History  Problem Relation Age of Onset  . COPD Father   . Alcohol abuse Father     Deceased  . Hypertension Mother     Living  . Healthy Daughter   . Healthy Son   . Anesthesia problems Neg Hx    Past Surgical History:  Procedure Laterality Date  . ABOVE KNEE LEG AMPUTATION Left 1982   osteosarcoma  . COLONOSCOPY  last one 02-28-2009  . ESOPHAGOGASTRODUODENOSCOPY (EGD) WITH ESOPHAGEAL DILATION  x2  last one 04-28-2008  . EXCISION BREAST BX  2015   CYST  . TONSILLECTOMY  as child  . TRANSANAL HEMORRHOIDAL DEARTERIALIZATION N/A 10/27/2015   Procedure: TRANSANAL HEMORRHOIDAL DEARTERIALIZATION;  Surgeon: Michael Boston, MD;  Location: [facility];  Service: General;  Laterality: N/A;   Social History   Occupational History  . Not on file.   Social History Main Topics  . Smoking status: Former Smoker    Packs/day: 1.00    Years: 37.00    Types: Cigarettes    Start date: 04/02/1973    Quit date: 12/19/2014  . Smokeless tobacco: Never Used     Comment: quit over a year.  . Alcohol use No  . Drug use: No     Comment: recovery addict since 2007  . Sexual activity: Not on file

## 2016-03-28 ENCOUNTER — Encounter (INDEPENDENT_AMBULATORY_CARE_PROVIDER_SITE_OTHER): Payer: Self-pay | Admitting: Physical Medicine and Rehabilitation

## 2016-03-28 NOTE — Procedures (Signed)
EMG & NCV Findings: Evaluation of the left median motor and the right median motor nerves showed prolonged distal onset latency (L5.3, R7.0 ms) and decreased conduction velocity (Elbow-Wrist, L45, R43 m/s).  The left median (across palm) sensory nerve showed prolonged distal peak latency (Wrist, 4.8 ms) and prolonged distal peak latency (Palm, 2.3 ms).  The right median (across palm) sensory nerve showed prolonged distal peak latency (Wrist, 6.6 ms), reduced amplitude (9.6 V), and prolonged distal peak latency (Palm, 2.8 ms).  The left ulnar sensory and the right ulnar sensory nerves showed reduced amplitude (L7.4, R4.1 V).  All remaining nerves (as indicated in the following tables) were within normal limits.  Left vs. Right side comparison data for the median motor nerve indicates abnormal L-R latency difference (1.7 ms).  The ulnar motor nerve indicates abnormal L-R velocity difference (A Elbow-B Elbow, 30 m/s).  All remaining left vs. right side differences were within normal limits.    All examined muscles (as indicated in the following table) showed no evidence of electrical instability.    Impression: The above electrodiagnostic study is ABNORMAL and reveals evidence of a moderate bilateral median nerve entrapment at the wrist (carpal tunnel syndrome) affecting sensory and motor components. The above electrodiagnostic study reveals evidence of peripheral neuropathy of bilateral upper extremities.   Recommendations: 1.  Follow-up with referring physician. 2.  Continue current management of symptoms. 3.  Suggest surgical evaluation.    Nerve Conduction Studies Anti Sensory Summary Table   Stim Site NR Peak (ms) Norm Peak (ms) P-T Amp (V) Norm P-T Amp Site1 Site2 Delta-P (ms) Dist (cm) Vel (m/s) Norm Vel (m/s)  Left Median Acr Palm Anti Sensory (2nd Digit)  31.5C  Wrist    *4.8 <3.6 15.9 >10 Wrist Palm 2.5 0.0    Palm    *2.3 <2.0 0.9         Right Median Acr Palm Anti Sensory (2nd  Digit)  33.6C  Wrist    *6.6 <3.6 *9.6 >10 Wrist Palm 3.8 0.0    Palm    *2.8 <2.0 3.6         Left Radial Anti Sensory (Base 1st Digit)  31.7C  Wrist    2.5 <3.1 18.2  Wrist Base 1st Digit 2.5 0.0    Right Radial Anti Sensory (Base 1st Digit)  32C  Wrist    2.4 <3.1 20.8  Wrist Base 1st Digit 2.4 0.0    Left Ulnar Anti Sensory (5th Digit)  31.9C  Wrist    3.4 <3.7 *7.4 >15.0 Wrist 5th Digit 3.4 14.0 41 >38  Right Ulnar Anti Sensory (5th Digit)  33.1C  Wrist    3.2 <3.7 *4.1 >15.0 Wrist 5th Digit 3.2 14.0 44 >38   Motor Summary Table   Stim Site NR Onset (ms) Norm Onset (ms) O-P Amp (mV) Norm O-P Amp Site1 Site2 Delta-0 (ms) Dist (cm) Vel (m/s) Norm Vel (m/s)  Left Median Motor (Abd Poll Brev)  31.9C  Wrist    *5.3 <4.2 8.2 >5 Elbow Wrist 4.2 19.0 *45 >50  Elbow    9.5  7.7         Right Median Motor (Abd Poll Brev)  31.7C  Wrist    *7.0 <4.2 6.8 >5 Elbow Wrist 4.5 19.5 *43 >50  Elbow    11.5  6.8         Left Ulnar Motor (Abd Dig Min)  31.9C  Wrist    3.1 <4.2 8.4 >3 B Elbow Wrist 3.4  18.0 53 >53  B Elbow    6.5  7.7  A Elbow B Elbow 1.9 10.0 53 >53  A Elbow    8.4  5.8         Right Ulnar Motor (Abd Dig Min)  31.4C  Wrist    2.8 <4.2 6.9 >3 B Elbow Wrist 3.6 19.0 53 >53  B Elbow    6.4  7.1  A Elbow B Elbow 1.2 10.0 83 >53  A Elbow    7.6  7.1          EMG   Side Muscle Nerve Root Ins Act Fibs Psw Amp Dur Poly Recrt Int Fraser Din Comment  Right Abd Poll Brev Median C8-T1 Nml Nml Nml Nml Nml 0 Nml Nml   Right 1stDorInt Ulnar C8-T1 Nml Nml Nml Nml Nml 0 Nml Nml   Right PronatorTeres Median C6-7 Nml Nml Nml Nml Nml 0 Nml Nml   Right Biceps Musculocut C5-6 Nml Nml Nml Nml Nml 0 Nml Nml   Right Deltoid Axillary C5-6 Nml Nml Nml Nml Nml 0 Nml Nml     Nerve Conduction Studies Anti Sensory Left/Right Comparison   Stim Site L Lat (ms) R Lat (ms) L-R Lat (ms) L Amp (V) R Amp (V) L-R Amp (%) Site1 Site2 L Vel (m/s) R Vel (m/s) L-R Vel (m/s)  Median Acr Palm Anti Sensory (2nd  Digit)  31.5C  Wrist *4.8 *6.6 1.8 15.9 *9.6 39.6 Wrist Palm     Palm *2.3 *2.8 0.5 0.9 3.6 75.0       Radial Anti Sensory (Base 1st Digit)  31.7C  Wrist 2.5 2.4 0.1 18.2 20.8 12.5 Wrist Base 1st Digit     Ulnar Anti Sensory (5th Digit)  31.9C  Wrist 3.4 3.2 0.2 *7.4 *4.1 44.6 Wrist 5th Digit 41 44 3   Motor Left/Right Comparison   Stim Site L Lat (ms) R Lat (ms) L-R Lat (ms) L Amp (mV) R Amp (mV) L-R Amp (%) Site1 Site2 L Vel (m/s) R Vel (m/s) L-R Vel (m/s)  Median Motor (Abd Poll Brev)  31.9C  Wrist *5.3 *7.0 *1.7 8.2 6.8 17.1 Elbow Wrist *45 *43 2  Elbow 9.5 11.5 2.0 7.7 6.8 11.7       Ulnar Motor (Abd Dig Min)  31.9C  Wrist 3.1 2.8 0.3 8.4 6.9 17.9 B Elbow Wrist 53 53 0  B Elbow 6.5 6.4 0.1 7.7 7.1 7.8 A Elbow B Elbow 53 83 *30  A Elbow 8.4 7.6 0.8 5.8 7.1 18.3

## 2016-03-29 ENCOUNTER — Ambulatory Visit (INDEPENDENT_AMBULATORY_CARE_PROVIDER_SITE_OTHER): Payer: Self-pay

## 2016-03-29 ENCOUNTER — Ambulatory Visit (INDEPENDENT_AMBULATORY_CARE_PROVIDER_SITE_OTHER): Payer: Commercial Managed Care - HMO

## 2016-03-29 ENCOUNTER — Ambulatory Visit (INDEPENDENT_AMBULATORY_CARE_PROVIDER_SITE_OTHER): Payer: Commercial Managed Care - HMO | Admitting: Orthopedic Surgery

## 2016-03-29 DIAGNOSIS — M160 Bilateral primary osteoarthritis of hip: Secondary | ICD-10-CM | POA: Diagnosis not present

## 2016-03-29 DIAGNOSIS — G5601 Carpal tunnel syndrome, right upper limb: Secondary | ICD-10-CM

## 2016-03-29 DIAGNOSIS — G5603 Carpal tunnel syndrome, bilateral upper limbs: Secondary | ICD-10-CM | POA: Insufficient documentation

## 2016-03-29 DIAGNOSIS — G5602 Carpal tunnel syndrome, left upper limb: Secondary | ICD-10-CM

## 2016-03-29 NOTE — Telephone Encounter (Signed)
Okay to write note.

## 2016-03-29 NOTE — Telephone Encounter (Signed)
Patient had this addressed in office by MD.

## 2016-03-29 NOTE — Progress Notes (Signed)
Office Visit Note   Patient: Stacy Thomas           Date of Birth: [birth year]           MRN: [id number] Visit Date: 03/29/2016              Requested by: Dellia Nims, MD [place],  [id number] PCP: Dellia Nims, MD   Assessment & Plan: Visit Diagnoses:  1. Primary osteoarthritis of both hips   2. Carpal tunnel syndrome, left upper limb   3. Carpal tunnel syndrome, right upper limb     Plan: Patient states she no longer can perform her activities daily living due to the carpal tunnel symptoms worse on the right than the left. We will plan for carpal tunnel surgery for release. Risk and benefits were discussed including infection neurovascular injury persistent pain and need for additional surgery. Also discussed the importance of nonweightbearing on the right hand and patient currently is using [place] crutches for her activities of daily living. Patient does have arthritis in her hips and discussed that we could consider an injection with Dr. Ernestina Patches for the hip arthritis and patient also states she would like to discuss total hip arthroplasty options with Dr. Ninfa Linden. Recommended patient try to obtain a motorized scooter to assist with activities of daily living also recommended that she have her mail delivered to her door. Following office 2 weeks after right carpal tunnel release.  Follow-Up Instructions: Return in about 2 weeks (around 04/12/2016).   Orders:  Orders Placed This Encounter  Procedures  . XR HIP UNILAT W OR W/O PELVIS 1V LEFT  . XR HIP UNILAT W OR W/O PELVIS 1V RIGHT   No orders of the defined types were placed in this encounter.     Procedures: No procedures performed   Clinical Data: No additional findings.   Subjective: No chief complaint on file.   Patient is here in office today following up for EMG/NCV with Dr. Ernestina Patches. Findings show moderate carpal tunnel bilaterally.    Review of Systems   Objective: Vital Signs: LMP  12/02/2010   Physical Exam examination patient is alert oriented no adenopathy well-dressed normal affect humerus [place] she does have an antalgic gait she has an above-the-knee prosthesis on the left which is stable. Patient has pain with internal and external rotation of both hips this reproduces her groin pain. Examination patient has grip strength weakness in both upper extremities has pain with Phalen's and Tinel's test. Patient's nerve conduction studies were reviewed which shows moderate carpal tunnel impingement bilaterally.  Ortho Exam  Specialty Comments:  No specialty comments available.  Imaging: Xr Hip Unilat W Or W/o Pelvis 1v Left  Result Date: 03/29/2016 Two-view radiographs of the left hip shows joint space narrowing medially with some mild subcondylar cysts and a non-congruent acetabulum and femoral head and joint.  Xr Hip Unilat W Or W/o Pelvis 1v Right  Result Date: 03/29/2016 Two-view radiographs of the right hip shows joint space narrowing medially with some mild subcondylar cysts and a non-congruent acetabulum and femoral head and joint.    PMFS History: Patient Active Problem List   Diagnosis Date Noted  . Carpal tunnel syndrome, left upper limb 03/29/2016  . Carpal tunnel syndrome, right upper limb 03/29/2016  . Paresthesia of skin 03/23/2016  . Atrophic vaginitis 03/06/2016  . Screening for cervical cancer 03/06/2016  . PVD (peripheral vascular disease) ([place]) 03/05/2016  . Pancreatic abnormality 03/05/2016  . HTN (hypertension) 03/05/2016  .  Hepatic cirrhosis ([place]) 11/15/2015  . External hemorrhoids with pain & bleeding 10/27/2015  . Chronic hepatitis C ([place]) 06/15/2015  . Transaminitis 06/14/2015  . Prolapsed internal hemorrhoids, grade 4 06/14/2015  . Peripheral neuropathy ([place]) 06/14/2015  . Unilateral AKA ([place])- left 06/14/2015  . Hx of osteosarcoma 06/14/2015  . DYSPHAGIA 06/13/2007  . ABDOMINAL PAIN-LUQ 06/13/2007   Past Medical History:    Diagnosis Date  . Bilateral arm pain    chronic  . Chronic hepatitis C ([place]) followed by dr comer at infectious disease   Genotype 1a--  currently on Harvoni started 09-13-2015  . Dependence on crutches    left leg prosthesis  . Drug addiction in remission ([place])    since 2007  (crack cocaine,  IV drug use)  . External hemorrhoid, bleeding   . Hand weakness   . History of esophageal stricture    post dilation x2  . History of osteosarcoma    1982  left knee---  s/p  AKA LLE--   per pt no recurrence  . History of recreational drug use multiple rehab visits   IV drug use for 1 yr and Smoked crack for 30+yrs--  per pt clean since 2007  . Hyperlipidemia   . Hypertension   . Idiopathic peripheral neuropathy   . Osteoarthritis   . Prolapsed internal hemorrhoids, grade 4   . Sigmoid diverticulosis    mild  . Wears glasses     Family History  Problem Relation Age of Onset  . COPD Father   . Alcohol abuse Father     Deceased  . Hypertension Mother     Living  . Healthy Daughter   . Healthy Son   . Anesthesia problems Neg Hx     Past Surgical History:  Procedure Laterality Date  . ABOVE KNEE LEG AMPUTATION Left 1982   osteosarcoma  . COLONOSCOPY  last one 02-28-2009  . ESOPHAGOGASTRODUODENOSCOPY (EGD) WITH ESOPHAGEAL DILATION  x2  last one 04-28-2008  . EXCISION BREAST BX  2015   CYST  . TONSILLECTOMY  as child  . TRANSANAL HEMORRHOIDAL DEARTERIALIZATION N/A 10/27/2015   Procedure: TRANSANAL HEMORRHOIDAL DEARTERIALIZATION;  Surgeon: Michael Boston, MD;  Location: [facility];  Service: General;  Laterality: N/A;   Social History   Occupational History  . Not on file.   Social History Main Topics  . Smoking status: Former Smoker    Packs/day: 1.00    Years: 37.00    Types: Cigarettes    Start date: 04/02/1973    Quit date: 12/19/2014  . Smokeless tobacco: Never Used     Comment: quit over a year.  . Alcohol use No  . Drug use: No     Comment: recovery  addict since 2007  . Sexual activity: Not on file

## 2016-03-30 ENCOUNTER — Telehealth (INDEPENDENT_AMBULATORY_CARE_PROVIDER_SITE_OTHER): Payer: Self-pay | Admitting: Orthopedic Surgery

## 2016-03-30 NOTE — Telephone Encounter (Signed)
Pt walked in stating that she needs a written form stating that she needs she needs her mailbox on her porch stating that it was necessary, that the post office does not have a form for this. Pt number is [phone]

## 2016-04-02 DIAGNOSIS — K7460 Unspecified cirrhosis of liver: Secondary | ICD-10-CM

## 2016-04-02 HISTORY — DX: Unspecified cirrhosis of liver: K74.60

## 2016-04-03 NOTE — Telephone Encounter (Signed)
I called patient to advise this would be left at front desk for her whenever she is ready to pick this up.

## 2016-04-09 ENCOUNTER — Encounter (INDEPENDENT_AMBULATORY_CARE_PROVIDER_SITE_OTHER): Payer: Self-pay | Admitting: Orthopedic Surgery

## 2016-04-09 ENCOUNTER — Ambulatory Visit (INDEPENDENT_AMBULATORY_CARE_PROVIDER_SITE_OTHER): Payer: Commercial Managed Care - HMO | Admitting: Orthopedic Surgery

## 2016-04-13 ENCOUNTER — Ambulatory Visit (INDEPENDENT_AMBULATORY_CARE_PROVIDER_SITE_OTHER): Payer: Commercial Managed Care - HMO | Admitting: Orthopedic Surgery

## 2016-04-26 ENCOUNTER — Ambulatory Visit (INDEPENDENT_AMBULATORY_CARE_PROVIDER_SITE_OTHER): Payer: Commercial Managed Care - HMO | Admitting: Internal Medicine

## 2016-04-26 VITALS — BP 120/68 | HR 79 | Temp 97.9°F | Ht 60.0 in | Wt 160.2 lb

## 2016-04-26 DIAGNOSIS — M79652 Pain in left thigh: Secondary | ICD-10-CM

## 2016-04-26 DIAGNOSIS — Z89612 Acquired absence of left leg above knee: Secondary | ICD-10-CM

## 2016-04-26 DIAGNOSIS — R531 Weakness: Secondary | ICD-10-CM

## 2016-04-26 DIAGNOSIS — M79651 Pain in right thigh: Secondary | ICD-10-CM

## 2016-04-26 DIAGNOSIS — G5601 Carpal tunnel syndrome, right upper limb: Secondary | ICD-10-CM

## 2016-04-26 DIAGNOSIS — Z9181 History of falling: Secondary | ICD-10-CM

## 2016-04-26 DIAGNOSIS — M160 Bilateral primary osteoarthritis of hip: Secondary | ICD-10-CM | POA: Insufficient documentation

## 2016-04-26 NOTE — Progress Notes (Signed)
   CC: Thigh pain, fall  HPI:  Ms.Akirra J Bleyer is a 56 y.o. woman here for a about 2 months of worsening thigh pain and weakness.   See problem based assessment and plan below for additional details  Past Medical History:  Diagnosis Date  . Bilateral arm pain    chronic  . Chronic hepatitis C ([place]) followed by dr comer at infectious disease   Genotype 1a--  currently on Harvoni started 09-13-2015  . Dependence on crutches    left leg prosthesis  . Drug addiction in remission ([place])    since 2007  (crack cocaine,  IV drug use)  . External hemorrhoid, bleeding   . Hand weakness   . History of esophageal stricture    post dilation x2  . History of osteosarcoma    1982  left knee---  s/p  AKA LLE--   per pt no recurrence  . History of recreational drug use multiple rehab visits   IV drug use for 1 yr and Smoked crack for 30+yrs--  per pt clean since 2007  . Hyperlipidemia   . Hypertension   . Idiopathic peripheral neuropathy   . Osteoarthritis   . Prolapsed internal hemorrhoids, grade 4   . Sigmoid diverticulosis    mild  . Wears glasses     Review of Systems:  Review of Systems  Respiratory: Negative for shortness of breath.   Cardiovascular: Negative for chest pain.  Gastrointestinal: Negative for abdominal pain, blood in stool and diarrhea.  Genitourinary: Negative for dysuria.  Musculoskeletal: Positive for falls, joint pain and myalgias.  Neurological: Positive for weakness.    Physical Exam: Physical Exam  Constitutional: She is well-developed, well-nourished, and in no distress.  HENT:  Head: Normocephalic and atraumatic.  Eyes: Conjunctivae are normal.  Cardiovascular: Normal rate and regular rhythm.   Pulmonary/Chest: Effort normal and breath sounds normal.  Abdominal: Soft. There is no tenderness.  Musculoskeletal: She exhibits no edema.  Hands are nontender and without obvious swelling, there is mild flexed deformity of the 5th digit PIPs on both  hands Right thigh tender to palpation over quadriceps, minimal pain with internal rotation, external rotation, or straight leg raise LLE AKA with prosthetic in place, thigh tender to palpation and hip ROM somewhat worse than right    Vitals:   04/26/16 0954  BP: 120/68  Pulse: 79  Temp: 97.9 F (36.6 C)  TempSrc: Oral  SpO2: 99%  Weight: 160 lb 3.2 oz (72.7 kg)  Height: 5' (1.524 m)    Assessment & Plan:   See Encounters Tab for problem based charting.  Patient discussed with Dr. Dareen Piano

## 2016-04-26 NOTE — Patient Instructions (Signed)
I suspect your thigh pain could be worsened as a rare side effect of your cholesterol medicine, which was just changed a few months ago. You should stop taking this medicine today and we will check a blood test to look for evidence of muscle damage. It is also possible this pain could come from your lower back or muscle inflammation by a different cause. Because this is unclear you should return between 1-2 weeks from now and reassess the problem.  For your hand I strongly recommend getting a wrist brace from local pharmacies or medical supply stores and wearing this at night. You can also take anti inflammatory medicine such as ibuprofen or aleve for symptom relief in the meantime.  I am placing a referral for physical therapy since I think your worsening leg weakness is a big problem that can also benefit with aggressive exercise and follow up. Decreasing crutch reliance will probably also help your hand pains.

## 2016-04-27 LAB — CK: CK TOTAL: 171 U/L (ref 24–173)

## 2016-04-29 NOTE — Assessment & Plan Note (Signed)
HPI: She has a known history of carpal tunnel syndrome already characterized with nerve conduction testing. She has not undergone surgery due to cost. Her symptoms are getting worse in the past month and she has not tried anything to help this so far.  A: I suspect her increasing use of crutches due to leg pain and weakness may be exacerbating her existing carpal tunnel disease There are no alarming findings of lost sensation or grossly decreased strength on examination  P: -Start wearing wrist splint nightly -NSAIDs as PRN treatment

## 2016-04-29 NOTE — Assessment & Plan Note (Addendum)
HPI: There is progressive pain in both thighs R>L for about 2 months. The pain is sharp and constant although worsens with direct touch or when putting weight on her leg. She fell using her lawnmower one month ago she is not sure if this was due to leg weakness or just getting her prosthetic foot caught on something. She has become more weak and depending increasingly on crutches for travel, needing to crawl on hand and knees at home on some occasions when not using crutches.  She was changed to atorvastatin from low dose pravastatin late last year which correlates pretty closely with her symptom worsening. She denies any other recent medication change. She also completed HCV treatment middle of last year without acute complications at that time. She has worked with physical therapy in the past but not recently.  A: Thigh pain is most concerning for statin induced myopathy Her decreased strength, abnormal gait, and worsening pain with use may also be worsened by deconditioning at this point  P: -Discontinue lipitor at this time -Checking CK in lab today -Referral to PT for decreased strength and mobility if her current pain starts to improve -Follow up within 1-2 weeks

## 2016-04-30 NOTE — Progress Notes (Signed)
Internal Medicine Clinic Attending  Case discussed with Dr. Benjamine Mola at the time of the visit.  We reviewed the resident's history and exam and pertinent patient test results.  I agree with the assessment, diagnosis, and plan of care documented in the resident's note.  Patient's CK is wnl. Will need follow up for further work up for muscle weakness if no improvement off statin

## 2016-05-03 ENCOUNTER — Ambulatory Visit (INDEPENDENT_AMBULATORY_CARE_PROVIDER_SITE_OTHER): Payer: Commercial Managed Care - HMO | Admitting: Pulmonary Disease

## 2016-05-03 ENCOUNTER — Telehealth (INDEPENDENT_AMBULATORY_CARE_PROVIDER_SITE_OTHER): Payer: Self-pay

## 2016-05-03 VITALS — BP 125/73 | HR 77 | Temp 97.8°F | Ht 61.0 in | Wt 162.6 lb

## 2016-05-03 DIAGNOSIS — Z23 Encounter for immunization: Secondary | ICD-10-CM | POA: Insufficient documentation

## 2016-05-03 DIAGNOSIS — G5603 Carpal tunnel syndrome, bilateral upper limbs: Secondary | ICD-10-CM

## 2016-05-03 DIAGNOSIS — E538 Deficiency of other specified B group vitamins: Secondary | ICD-10-CM

## 2016-05-03 DIAGNOSIS — M1612 Unilateral primary osteoarthritis, left hip: Secondary | ICD-10-CM

## 2016-05-03 DIAGNOSIS — Z89612 Acquired absence of left leg above knee: Secondary | ICD-10-CM

## 2016-05-03 DIAGNOSIS — M79651 Pain in right thigh: Secondary | ICD-10-CM | POA: Diagnosis not present

## 2016-05-03 DIAGNOSIS — R1031 Right lower quadrant pain: Secondary | ICD-10-CM

## 2016-05-03 DIAGNOSIS — E559 Vitamin D deficiency, unspecified: Secondary | ICD-10-CM

## 2016-05-03 DIAGNOSIS — M1611 Unilateral primary osteoarthritis, right hip: Secondary | ICD-10-CM

## 2016-05-03 DIAGNOSIS — M160 Bilateral primary osteoarthritis of hip: Secondary | ICD-10-CM | POA: Diagnosis not present

## 2016-05-03 MED ORDER — DICLOFENAC SODIUM 1 % TD GEL: 4.0000 g | Freq: Four times a day (QID) | TRANSDERMAL | 0 refills | Status: DC

## 2016-05-03 MED ORDER — IBUPROFEN 600 MG PO TABS: 600.0000 mg | ORAL_TABLET | Freq: Four times a day (QID) | ORAL | 0 refills | Status: DC | PRN

## 2016-05-03 NOTE — Assessment & Plan Note (Signed)
Assessment: Exacerbation of carpal tunnel syndrome with crutches use. Unable to afford surgery.  Plan: -Voltaren gel four times a day over wrists

## 2016-05-03 NOTE — Assessment & Plan Note (Signed)
HPI: She was last seen in clinic 1/25. She had been having about 2 months of worsening thigh pain and weakness. Seems to have started after starting Lipitor. It is worse since stopping Lipitor. She has pain most in her right thigh. It is most in the groin. She has been taking Goody powders for it.   She has a history of primary OA of both hips that was noted on XR recently. She was seen by her orthopedic surgeon 12/28. They discussed injection with Dr. Ernestina Patches for the hip arthritis.   Assessment: Unlikely to be statin induced myopathy given normal range CK and worsening of symptoms since stopping the atorvastatin. Probably related to her hip OA given that she has a lot of pain in her groin.   Plan: -Check Vitamin D level -Short course of ibuprofen 600mg  q6hr -Alternate with Extra Strength Tylenol -Follow up with orthopedics for joint injection

## 2016-05-03 NOTE — Telephone Encounter (Signed)
Put order in chart

## 2016-05-03 NOTE — Assessment & Plan Note (Signed)
Tdap administered.

## 2016-05-03 NOTE — Progress Notes (Signed)
   CC: thigh pain follow up  HPI:  [name] is a 56 y.o. with history as outlined below presenting for follow up of her thigh pain.   Please see problem based charting for rest of HPI  Past Medical History:  Diagnosis Date  . Bilateral arm pain    chronic  . Chronic hepatitis C ([place]) followed by dr comer at infectious disease   Genotype 1a--  currently on Harvoni started 09-13-2015  . Dependence on crutches    left leg prosthesis  . Drug addiction in remission ([place])    since 2007  (crack cocaine,  IV drug use)  . External hemorrhoid, bleeding   . Hand weakness   . History of esophageal stricture    post dilation x2  . History of osteosarcoma    1982  left knee---  s/p  AKA LLE--   per pt no recurrence  . History of recreational drug use multiple rehab visits   IV drug use for 1 yr and Smoked crack for 30+yrs--  per pt clean since 2007  . Hyperlipidemia   . Hypertension   . Idiopathic peripheral neuropathy   . Osteoarthritis   . Prolapsed internal hemorrhoids, grade 4   . Sigmoid diverticulosis    mild  . Wears glasses     Review of Systems:   No fevers or chills No abdominal pain  Physical Exam:  Vitals:   05/03/16 0825  BP: 125/73  Pulse: 77  Temp: 97.8 F (36.6 C)  TempSrc: Oral  SpO2: 99%  Weight: 162 lb 9.6 oz (73.8 kg)  Height: 5\' 1"  (1.549 m)   General Apperance: NAD HEENT: Normocephalic, atraumatic, anicteric sclera Neck: Supple, trachea midline Lungs: Clear to auscultation bilaterally. No wheezes, rhonchi or rales. Breathing comfortably on room air Heart: Regular rate and rhythm Abdomen: Soft, nontender, nondistended Extremities: Warm and well perfused, no edema. +L AKA. Tender to palpation along right groin, upper thigh and lateral hip. Skin: No rashes or lesions Neurologic: Alert and interactive. No gross deficits.   Assessment & Plan:   See Encounters Tab for problem based charting.  Patient discussed with Dr. Daryll Drown

## 2016-05-03 NOTE — Patient Instructions (Signed)
You may take the prescription ibuprofen every 6 hours as needed. Stop taking Goody powders or other NSAIDS while taking this medication. You may also take extra strength Tylenol You may use the Voltaren gel on your wrists as instructed Please make an appointment to see your orthopedic surgeon for joint injection.

## 2016-05-03 NOTE — Telephone Encounter (Signed)
Patient walked in earlier stating she needed to make an appt to be seen by Dr. Ernestina Patches for an injection. Said she had spoken with Dr. Sharol Given about this. Told her I would have to call her back after I looked at her chart. Looks like hip injections were discussed at her last appt but no order put in. Can you put one in and then I will call her to get scheduled.

## 2016-05-04 ENCOUNTER — Encounter: Payer: Self-pay | Admitting: Pulmonary Disease

## 2016-05-04 DIAGNOSIS — E559 Vitamin D deficiency, unspecified: Secondary | ICD-10-CM | POA: Insufficient documentation

## 2016-05-04 LAB — VITAMIN D 25 HYDROXY (VIT D DEFICIENCY, FRACTURES): Vit D, 25-Hydroxy: 15.8 ng/mL — ABNORMAL LOW (ref 30.0–100.0)

## 2016-05-04 MED ORDER — VITAMIN D 1000 UNITS PO TABS: 1000.0000 [IU] | ORAL_TABLET | Freq: Every day | ORAL | 2 refills | Status: DC

## 2016-05-04 NOTE — Assessment & Plan Note (Signed)
Vitamin D level 15.8  Start 1000 international units daily.  Repeat serum 25(OH)D level after approximately three months of therapy

## 2016-05-04 NOTE — Addendum Note (Signed)
Addended by: Jacques Earthly T on: 05/04/2016 12:05 PM   Modules accepted: Orders

## 2016-05-07 ENCOUNTER — Ambulatory Visit: Payer: Commercial Managed Care - HMO

## 2016-05-08 ENCOUNTER — Ambulatory Visit (INDEPENDENT_AMBULATORY_CARE_PROVIDER_SITE_OTHER): Payer: Commercial Managed Care - HMO | Admitting: Physical Medicine and Rehabilitation

## 2016-05-08 ENCOUNTER — Ambulatory Visit (INDEPENDENT_AMBULATORY_CARE_PROVIDER_SITE_OTHER): Payer: Self-pay

## 2016-05-08 VITALS — BP 121/80 | HR 106

## 2016-05-08 DIAGNOSIS — M25552 Pain in left hip: Secondary | ICD-10-CM

## 2016-05-08 DIAGNOSIS — M25551 Pain in right hip: Secondary | ICD-10-CM

## 2016-05-08 NOTE — Progress Notes (Signed)
Stacy Thomas - 56 y.o. female MRN [id number]  Date of birth: Sep 26, 1960  Office Visit Note: Visit Date: 05/08/2016 PCP: Dellia Nims, MD Referred by: Dellia Nims, MD  Subjective: Chief Complaint  Patient presents with  . Left Hip - Pain  . Right Hip - Pain   HPI: Stacy Thomas is a 56 year old female that I have actually seen in the past for nerve conduction studies of her hands and she does have carpal tunnel syndrome and this is being looked at by Dr. Sharol Given. Dr. Sharol Given follows her for her other orthopedic care. She's been having a lot of hip pain bilaterally with referral into groin and thigh. X-rays have shown medial joint line arthritis with some cystic changes. Her right hip is worse than the left. He requests a diagnostic and hopefully therapeutic anesthetic hip arthrograms. We will complete the right today due to that being the worst side.    ROS Otherwise per HPI.  Assessment & Plan: Visit Diagnoses:  1. Pain in right hip   2. Pain in left hip     Plan: Findings:  Right anesthetic hip arthrogram.    Meds & Orders: No orders of the defined types were placed in this encounter.   Orders Placed This Encounter  Procedures  . Large Joint Injection/Arthrocentesis  . XR C-ARM NO REPORT    Follow-up: Return for left side hip injection.   Procedures: Large Joint Inj Date/Time: 05/08/2016 2:54 PM Performed by: Magnus Sinning Authorized by: Magnus Sinning   Consent Given by:  Patient Site marked: the procedure site was marked   Timeout: prior to procedure the correct patient, procedure, and site was verified   Indications:  Pain and diagnostic evaluation Location:  Hip Site:  R hip joint Prep: patient was prepped and draped in usual sterile fashion   Needle Size:  22 G Needle Length:  3.5 inches Approach:  Anterior Ultrasound Guidance: No   Fluoroscopic Guidance: No   Arthrogram: Yes   Medications:  4 mL lidocaine 2 %; 80 mg triamcinolone acetonide 40  MG/ML Aspiration Attempted: Yes   Patient tolerance:  Patient tolerated the procedure well with no immediate complications  Arthrogram demonstrated excellent flow of contrast throughout the joint surface without extravasation or obvious defect.  The patient had relief of symptoms during the anesthetic phase of the injection.     No notes on file   Clinical History: No specialty comments available.  She reports that she quit smoking about 16 months ago. Her smoking use included Cigarettes. She started smoking about 43 years ago. She has a 37.00 pack-year smoking history. She has never used smokeless tobacco.   Recent Labs  03/05/16 1548  HGBA1C 5.4    Objective:  VS:  HT:    WT:   BMI:     BP:121/80  HR:(!) 106bpm  TEMP: ( )  RESP:  Physical Exam  Musculoskeletal:  The patient ambulates without aid she has concordant pain with hip rotation internally both sides. She does have left amputation with prosthetic.    Ortho Exam Imaging: Xr C-arm No Report  Result Date: 05/08/2016 Please see Notes or Procedures tab for imaging impression.   Past Medical/Family/Surgical/Social History: Medications & Allergies reviewed per EMR Patient Active Problem List   Diagnosis Date Noted  . Vitamin D deficiency 05/04/2016  . Bilateral primary osteoarthritis of hip 04/26/2016  . Bilateral carpal tunnel syndrome 03/29/2016  . Paresthesia of skin 03/23/2016  . Atrophic vaginitis 03/06/2016  . Screening for  cervical cancer 03/06/2016  . PVD (peripheral vascular disease) ([place]) 03/05/2016  . Pancreatic abnormality 03/05/2016  . HTN (hypertension) 03/05/2016  . Hepatic cirrhosis ([place]) 11/15/2015  . External hemorrhoids with pain & bleeding 10/27/2015  . Chronic hepatitis C ([place]) 06/15/2015  . Transaminitis 06/14/2015  . Prolapsed internal hemorrhoids, grade 4 06/14/2015  . Peripheral neuropathy ([place]) 06/14/2015  . Unilateral AKA ([place])- left 06/14/2015  . Hx of osteosarcoma 06/14/2015  .  DYSPHAGIA 06/13/2007  . ABDOMINAL PAIN-LUQ 06/13/2007   Past Medical History:  Diagnosis Date  . Bilateral arm pain    chronic  . Chronic hepatitis C ([place]) followed by dr comer at infectious disease   Genotype 1a--  currently on Harvoni started 09-13-2015  . Dependence on crutches    left leg prosthesis  . Drug addiction in remission ([place])    since 2007  (crack cocaine,  IV drug use)  . External hemorrhoid, bleeding   . Hand weakness   . History of esophageal stricture    post dilation x2  . History of osteosarcoma    1982  left knee---  s/p  AKA LLE--   per pt no recurrence  . History of recreational drug use multiple rehab visits   IV drug use for 1 yr and Smoked crack for 30+yrs--  per pt clean since 2007  . Hyperlipidemia   . Hypertension   . Idiopathic peripheral neuropathy   . Osteoarthritis   . Prolapsed internal hemorrhoids, grade 4   . Sigmoid diverticulosis    mild  . Wears glasses    Family History  Problem Relation Age of Onset  . COPD Father   . Alcohol abuse Father     Deceased  . Hypertension Mother     Living  . Healthy Daughter   . Healthy Son   . Anesthesia problems Neg Hx    Past Surgical History:  Procedure Laterality Date  . ABOVE KNEE LEG AMPUTATION Left 1982   osteosarcoma  . COLONOSCOPY  last one 02-28-2009  . ESOPHAGOGASTRODUODENOSCOPY (EGD) WITH ESOPHAGEAL DILATION  x2  last one 04-28-2008  . EXCISION BREAST BX  2015   CYST  . TONSILLECTOMY  as child  . TRANSANAL HEMORRHOIDAL DEARTERIALIZATION N/A 10/27/2015   Procedure: TRANSANAL HEMORRHOIDAL DEARTERIALIZATION;  Surgeon: Michael Boston, MD;  Location: [facility];  Service: General;  Laterality: N/A;   Social History   Occupational History  . Not on file.   Social History Main Topics  . Smoking status: Former Smoker    Packs/day: 1.00    Years: 37.00    Types: Cigarettes    Start date: 04/02/1973    Quit date: 12/19/2014  . Smokeless tobacco: Never Used     Comment:  quit over a year.  . Alcohol use No  . Drug use: No     Comment: recovery addict since 2007  . Sexual activity: Not on file

## 2016-05-08 NOTE — Patient Instructions (Signed)

## 2016-05-08 NOTE — Progress Notes (Signed)
Internal Medicine Clinic Attending  Case discussed with Dr. Krall soon after the resident saw the patient.  We reviewed the resident's history and exam and pertinent patient test results.  I agree with the assessment, diagnosis, and plan of care documented in the resident's note. 

## 2016-05-09 MED ORDER — TRIAMCINOLONE ACETONIDE 40 MG/ML IJ SUSP
80.0000 mg | INTRAMUSCULAR | Status: AC | PRN
  Administered 2016-05-08: 80 mg via INTRA_ARTICULAR

## 2016-05-09 MED ORDER — LIDOCAINE HCL 2 % IJ SOLN
4.0000 mL | INTRAMUSCULAR | Status: AC | PRN
  Administered 2016-05-08: 4 mL

## 2016-05-21 ENCOUNTER — Ambulatory Visit (INDEPENDENT_AMBULATORY_CARE_PROVIDER_SITE_OTHER): Payer: Commercial Managed Care - HMO | Admitting: Physical Medicine and Rehabilitation

## 2016-05-23 ENCOUNTER — Ambulatory Visit (INDEPENDENT_AMBULATORY_CARE_PROVIDER_SITE_OTHER): Payer: Commercial Managed Care - HMO | Admitting: Physical Medicine and Rehabilitation

## 2016-05-31 NOTE — Addendum Note (Signed)
Addended by: Hulan Fray on: 05/31/2016 07:21 PM   Modules accepted: Orders

## 2016-06-04 ENCOUNTER — Encounter: Payer: Commercial Managed Care - HMO | Admitting: Internal Medicine

## 2016-06-11 ENCOUNTER — Other Ambulatory Visit: Payer: Self-pay | Admitting: Pulmonary Disease

## 2016-06-19 ENCOUNTER — Encounter: Payer: Self-pay | Admitting: Internal Medicine

## 2016-06-19 ENCOUNTER — Ambulatory Visit (INDEPENDENT_AMBULATORY_CARE_PROVIDER_SITE_OTHER): Payer: Commercial Managed Care - HMO | Admitting: Internal Medicine

## 2016-06-19 VITALS — BP 120/81 | HR 106 | Temp 98.2°F | Ht 61.0 in | Wt 158.0 lb

## 2016-06-19 DIAGNOSIS — K7460 Unspecified cirrhosis of liver: Secondary | ICD-10-CM | POA: Diagnosis not present

## 2016-06-19 DIAGNOSIS — R740 Nonspecific elevation of levels of transaminase and lactic acid dehydrogenase [LDH]: Secondary | ICD-10-CM | POA: Diagnosis not present

## 2016-06-19 DIAGNOSIS — B182 Chronic viral hepatitis C: Secondary | ICD-10-CM | POA: Diagnosis not present

## 2016-06-19 DIAGNOSIS — R7401 Elevation of levels of liver transaminase levels: Secondary | ICD-10-CM

## 2016-06-19 LAB — COMPLETE METABOLIC PANEL WITH GFR
ALT: 22 U/L (ref 6–29)
AST: 34 U/L (ref 10–35)
Albumin: 4.2 g/dL (ref 3.6–5.1)
Alkaline Phosphatase: 106 U/L (ref 33–130)
BUN: 22 mg/dL (ref 7–25)
CHLORIDE: 104 mmol/L (ref 98–110)
CO2: 25 mmol/L (ref 20–31)
CREATININE: 1.2 mg/dL — AB (ref 0.50–1.05)
Calcium: 9.8 mg/dL (ref 8.6–10.4)
GFR, Est African American: 58 mL/min — ABNORMAL LOW (ref >=60)
GFR, Est Non African American: 50 mL/min — ABNORMAL LOW (ref >=60)
GLUCOSE: 115 mg/dL — AB (ref 65–99)
Potassium: 3.5 mmol/L (ref 3.5–5.3)
SODIUM: 139 mmol/L (ref 135–146)
TOTAL PROTEIN: 7.6 g/dL (ref 6.1–8.1)
Total Bilirubin: 0.7 mg/dL (ref 0.2–1.2)

## 2016-06-19 NOTE — Progress Notes (Signed)
   Subjective:    Patient ID: Stacy Thomas, female    DOB: Feb 20, 1961, 56 y.o.   MRN: [id number]  HPI Here for follow up of HCV  Has genotype 1a, initial viral load 1.3 million and elastography with F3/4, recent CT scan done by Dr. Havery Moros with cirrhosis.  No alcohol.  Here for SVR 24 and ultrasound for [place] screening.  No issues.  No associated n/v/d. No weight loss.    Review of Systems  Constitutional: Negative for fatigue.  Gastrointestinal: Negative for abdominal pain and nausea.  Skin: Negative for rash.       Objective:   Physical Exam  Constitutional: She appears well-developed and well-nourished.  Eyes: No scleral icterus.  Cardiovascular: Normal rate, regular rhythm and normal heart sounds.   No murmur heard. Skin: No rash noted.   SH: no alcohol       Assessment & Plan:

## 2016-06-20 NOTE — Assessment & Plan Note (Signed)
Will do [place] screening with ultrasound.  I will have her PCP continue to do screening every 6 months after that.    She can rtc prn

## 2016-06-20 NOTE — Assessment & Plan Note (Signed)
Will check transaminases to see if this has resolved as well.

## 2016-06-20 NOTE — Assessment & Plan Note (Signed)
Will check SVR 24 today. Considered cured if negative.

## 2016-06-21 LAB — HEPATITIS C RNA QUANTITATIVE
HCV Quantitative Log: <1.18 Log IU/mL
HCV Quantitative: <15 IU/mL

## 2016-06-25 ENCOUNTER — Ambulatory Visit (INDEPENDENT_AMBULATORY_CARE_PROVIDER_SITE_OTHER): Payer: Commercial Managed Care - HMO | Admitting: Orthopedic Surgery

## 2016-06-25 ENCOUNTER — Encounter (INDEPENDENT_AMBULATORY_CARE_PROVIDER_SITE_OTHER): Payer: Self-pay | Admitting: Orthopedic Surgery

## 2016-06-25 ENCOUNTER — Telehealth: Payer: Self-pay | Admitting: *Deleted

## 2016-06-25 DIAGNOSIS — M1711 Unilateral primary osteoarthritis, right knee: Secondary | ICD-10-CM | POA: Diagnosis not present

## 2016-06-25 MED ORDER — METHYLPREDNISOLONE ACETATE 40 MG/ML IJ SUSP
40.0000 mg | INTRAMUSCULAR | Status: AC | PRN
  Administered 2016-06-25: 40 mg via INTRA_ARTICULAR

## 2016-06-25 MED ORDER — LIDOCAINE HCL 1 % IJ SOLN
5.0000 mL | INTRAMUSCULAR | Status: AC | PRN
  Administered 2016-06-25: 5 mL

## 2016-06-25 NOTE — Telephone Encounter (Signed)
Patient called for results of her hep c viral load. Advised her it is undetectable.

## 2016-06-25 NOTE — Progress Notes (Signed)
Office Visit Note   Patient: Stacy Thomas           Date of Birth: [birth year]           MRN: [id number] Visit Date: 06/25/2016              Requested by: Dellia Nims, MD [place], [place] [id number] PCP: Dellia Nims, MD  Chief Complaint  Patient presents with  . Right Knee - Pain    HPI: Patient presents with recurrent pain and swelling right knee. She's had temporary relief with steroid injections states she is not interested in total knee replacement at this time.  Assessment & Plan: Visit Diagnoses:  1. Unilateral primary osteoarthritis, right knee     Plan: Right knee was injected from the anteromedial portal without complications. Discussed total knee arthroplasty options follow-up as needed  Follow-Up Instructions: Return if symptoms worsen or fail to improve.   Ortho Exam  Patient is alert, oriented, no adenopathy, well-dressed, normal affect, normal respiratory effort. Examination patient is alert oriented no adenopathy well-dressed normal affect normal respiratory effort she does have an antalgic gait. She has a stable above-knee amputation the left. Right knee has crepitation with range of motion closing cruciate are stable she has a mild effusion she is tender to palpation medial and lateral joint line. After informed consent her knee was injected from the anterior medial portal.  Imaging: No results found.  Labs: Lab Results  Component Value Date   HGBA1C 5.4 03/05/2016    Orders:  No orders of the defined types were placed in this encounter.  No orders of the defined types were placed in this encounter.    Procedures: Large Joint Inj Date/Time: 06/25/2016 8:17 AM Performed by: [place] [facility] Authorized by: Newt Minion   Consent Given by:  Patient Site marked: the procedure site was marked   Timeout: prior to procedure the correct patient, procedure, and site was verified   Indications:  Pain and diagnostic evaluation Location:   Knee Site:  R knee Needle Size:  22 G Needle Length:  1.5 inches Approach:  Anteromedial Ultrasound Guidance: No   Fluoroscopic Guidance: No   Arthrogram: No   Medications:  5 mL lidocaine 1 %; 40 mg methylPREDNISolone acetate 40 MG/ML Aspiration Attempted: No   Patient tolerance:  Patient tolerated the procedure well with no immediate complications    Clinical Data: No additional findings.  ROS: Review of Systems  Objective: Vital Signs: LMP 01/22/2011   Specialty Comments:  No specialty comments available.  PMFS History: Patient Active Problem List   Diagnosis Date Noted  . Vitamin D deficiency 05/04/2016  . Bilateral primary osteoarthritis of hip 04/26/2016  . Bilateral carpal tunnel syndrome 03/29/2016  . Paresthesia of skin 03/23/2016  . Atrophic vaginitis 03/06/2016  . Screening for cervical cancer 03/06/2016  . PVD (peripheral vascular disease) ([place]) 03/05/2016  . Pancreatic abnormality 03/05/2016  . HTN (hypertension) 03/05/2016  . Hepatic cirrhosis ([place]) 11/15/2015  . External hemorrhoids with pain & bleeding 10/27/2015  . Chronic hepatitis C ([place]) 06/15/2015  . Transaminitis 06/14/2015  . Prolapsed internal hemorrhoids, grade 4 06/14/2015  . Peripheral neuropathy ([place]) 06/14/2015  . Unilateral AKA ([place])- left 06/14/2015  . Hx of osteosarcoma 06/14/2015  . DYSPHAGIA 06/13/2007  . ABDOMINAL PAIN-LUQ 06/13/2007   Past Medical History:  Diagnosis Date  . Bilateral arm pain    chronic  . Chronic hepatitis C ([place]) followed by dr comer at infectious disease  Genotype 1a--  currently on Harvoni started 09-13-2015  . Dependence on crutches    left leg prosthesis  . Drug addiction in remission ([place])    since 2007  (crack cocaine,  IV drug use)  . External hemorrhoid, bleeding   . Hand weakness   . History of esophageal stricture    post dilation x2  . History of osteosarcoma    1982  left knee---  s/p  AKA LLE--   per pt no recurrence  . History of  recreational drug use multiple rehab visits   IV drug use for 1 yr and Smoked crack for 30+yrs--  per pt clean since 2007  . Hyperlipidemia   . Hypertension   . Idiopathic peripheral neuropathy   . Osteoarthritis   . Prolapsed internal hemorrhoids, grade 4   . Sigmoid diverticulosis    mild  . Wears glasses     Family History  Problem Relation Age of Onset  . COPD Father   . Alcohol abuse Father     Deceased  . Hypertension Mother     Living  . Healthy Daughter   . Healthy Son   . Anesthesia problems Neg Hx     Past Surgical History:  Procedure Laterality Date  . ABOVE KNEE LEG AMPUTATION Left 1982   osteosarcoma  . COLONOSCOPY  last one 02-28-2009  . ESOPHAGOGASTRODUODENOSCOPY (EGD) WITH ESOPHAGEAL DILATION  x2  last one 04-28-2008  . EXCISION BREAST BX  2015   CYST  . TONSILLECTOMY  as child  . TRANSANAL HEMORRHOIDAL DEARTERIALIZATION N/A 10/27/2015   Procedure: TRANSANAL HEMORRHOIDAL DEARTERIALIZATION;  Surgeon: Michael Boston, MD;  Location: [facility];  Service: General;  Laterality: N/A;   Social History   Occupational History  . Not on file.   Social History Main Topics  . Smoking status: Former Smoker    Packs/day: 1.00    Years: 37.00    Types: Cigarettes    Start date: 04/02/1973    Quit date: 12/19/2014  . Smokeless tobacco: Never Used     Comment: quit over a year.  . Alcohol use No  . Drug use: No     Comment: recovery addict since 2007  . Sexual activity: Not on file

## 2016-06-27 ENCOUNTER — Ambulatory Visit
Admission: RE | Admit: 2016-06-27 | Discharge: 2016-06-27 | Disposition: A | Discharge status: Home / Self Care | Payer: Commercial Managed Care - HMO | Source: Ambulatory Visit | Attending: Internal Medicine | Admitting: Internal Medicine

## 2016-06-27 DIAGNOSIS — K7460 Unspecified cirrhosis of liver: Secondary | ICD-10-CM

## 2016-07-18 ENCOUNTER — Telehealth: Payer: Self-pay

## 2016-07-18 NOTE — Telephone Encounter (Signed)
-----   Message from Manus Gunning, MD sent at 07/18/2016 12:31 PM EDT ----- No I don't think we need to do that but may be good to see her back in the clinic for a follow up. Thanks  ----- Message ----- From: Doristine Counter, RN Sent: 07/18/2016  10:48 AM To: Manus Gunning, MD  I had a reminder to schedule 6 month follow up US abdomen for this patient, I noticed that her PCP did one on 3/28, but it was not with elastography. Do you want to schedule another one with elastography?

## 2016-07-18 NOTE — Telephone Encounter (Signed)
Sent patient message about scheduled follow up appointment. Asked that if this is not convenient to contact office to reschedule.

## 2016-08-29 ENCOUNTER — Ambulatory Visit (INDEPENDENT_AMBULATORY_CARE_PROVIDER_SITE_OTHER): Payer: Commercial Managed Care - HMO | Admitting: Gastroenterology

## 2016-08-29 ENCOUNTER — Encounter: Payer: Self-pay | Admitting: Gastroenterology

## 2016-08-29 ENCOUNTER — Other Ambulatory Visit (INDEPENDENT_AMBULATORY_CARE_PROVIDER_SITE_OTHER): Payer: Commercial Managed Care - HMO

## 2016-08-29 VITALS — BP 110/76 | HR 72 | Ht 61.0 in | Wt 157.0 lb

## 2016-08-29 DIAGNOSIS — K862 Cyst of pancreas: Secondary | ICD-10-CM | POA: Diagnosis not present

## 2016-08-29 DIAGNOSIS — Z8619 Personal history of other infectious and parasitic diseases: Secondary | ICD-10-CM | POA: Diagnosis not present

## 2016-08-29 DIAGNOSIS — R932 Abnormal findings on diagnostic imaging of liver and biliary tract: Secondary | ICD-10-CM

## 2016-08-29 LAB — CBC WITH DIFFERENTIAL/PLATELET
BASOS PCT: 0.7 % (ref 0.0–3.0)
Basophils Absolute: 0 10*3/uL (ref 0.0–0.1)
Eosinophils Absolute: 0.1 10*3/uL (ref 0.0–0.7)
Eosinophils Relative: 1.9 % (ref 0.0–5.0)
HEMATOCRIT: 44.3 % (ref 36.0–46.0)
Hemoglobin: 15 g/dL (ref 12.0–15.0)
LYMPHS ABS: 2.7 10*3/uL (ref 0.7–4.0)
LYMPHS PCT: 53.3 % — AB (ref 12.0–46.0)
MCHC: 33.9 g/dL (ref 30.0–36.0)
MCV: 91.2 fl (ref 78.0–100.0)
MONOS PCT: 8.8 % (ref 3.0–12.0)
Monocytes Absolute: 0.5 10*3/uL (ref 0.1–1.0)
NEUTROS ABS: 1.8 10*3/uL (ref 1.4–7.7)
NEUTROS PCT: 35.3 % — AB (ref 43.0–77.0)
PLATELETS: 191 10*3/uL (ref 150.0–400.0)
RBC: 4.86 Mil/uL (ref 3.87–5.11)
RDW: 13.2 % (ref 11.5–15.5)
WBC: 5.1 10*3/uL (ref 4.0–10.5)

## 2016-08-29 LAB — PROTIME-INR
INR: 1.2 ratio — ABNORMAL HIGH (ref 0.8–1.0)
PROTHROMBIN TIME: 12.8 s (ref 9.6–13.1)

## 2016-08-29 NOTE — Progress Notes (Signed)
HPI :  56 y/o female with a history of hepatitis C, treated, with possible cirrhosis, here for a follow up visit.   She had been diagnosed with hepatitis C last year. She previously used IV drugs and smoked crack cocaine, this was >10 years ago when she was last used. She had genotype 1A with ALT and AST elevated to low 100s at the time of diagnosis. She denies any alcohol use at all. She was referred to Dr Linus Salmons, who treated her with 12 weeks of Harvoni this past summer, after which she has had a negative viral load. She had an [place] with elastography prior to therapy showing normal liver and spleen, with some F3/F4 fibrotic changes, risk for cirrhosis. Platelet count was normal. She had a follow up US of the liver this past September 2017 which was normal, however a follow up CT scan on 02/06/16 due to mildly elevated AFP showed cirrhotic changes along with a 1.5cm pancreatic cyst, no hepatic mass lesions. Follow up US on 3/28 was negative for [facility] but showed "mild cirrhotic changes".   She generally has been doing well since the last visit. She had some epigastric pain in recent weeks for which she took omeprazole and it resolved her symptoms. She denies any reflux or dysphagia symptoms. She has not had an EGD for screening for varices, has declined to date.   Prior imaging: [place] 06/27/16 - "mild cirrhosis", no mass lesions CT abdomen 02/06/2016 - subtle changes of cirrhosis, indeterminate lesion in pancreas 1.5cm thought to represent cyst.  Colonoscopy 02/28/2009 - external / internal hemorrhoids, diverticulosis     Past Medical History:  Diagnosis Date  . Bilateral arm pain    chronic  . Chronic hepatitis C ([place]) followed by dr comer at infectious disease   Genotype 1a--  currently on Harvoni started 09-13-2015  . Dependence on crutches    left leg prosthesis  . Drug addiction in remission ([place])    since 2007  (crack cocaine,  IV drug use)  . External hemorrhoid, bleeding   . Hand weakness    . History of esophageal stricture    post dilation x2  . History of osteosarcoma    1982  left knee---  s/p  AKA LLE--   per pt no recurrence  . History of recreational drug use multiple rehab visits   IV drug use for 1 yr and Smoked crack for 30+yrs--  per pt clean since 2007  . Hyperlipidemia   . Hypertension   . Idiopathic peripheral neuropathy   . Osteoarthritis   . Prolapsed internal hemorrhoids, grade 4   . Sigmoid diverticulosis    mild  . Wears glasses      Past Surgical History:  Procedure Laterality Date  . ABOVE KNEE LEG AMPUTATION Left 1982   osteosarcoma  . COLONOSCOPY  last one 02-28-2009  . ESOPHAGOGASTRODUODENOSCOPY (EGD) WITH ESOPHAGEAL DILATION  x2  last one 04-28-2008  . EXCISION BREAST BX  2015   CYST  . TONSILLECTOMY  as child  . TRANSANAL HEMORRHOIDAL DEARTERIALIZATION N/A 10/27/2015   Procedure: TRANSANAL HEMORRHOIDAL DEARTERIALIZATION;  Surgeon: Michael Boston, MD;  Location: [facility];  Service: General;  Laterality: N/A;   Family History  Problem Relation Age of Onset  . COPD Father   . Alcohol abuse Father        Deceased  . Hypertension Mother        Living  . Healthy Daughter   . Healthy Son   .  Anesthesia problems Neg Hx    Social History  Substance Use Topics  . Smoking status: Former Smoker    Packs/day: 1.00    Years: 37.00    Types: Cigarettes    Start date: 04/02/1973    Quit date: 12/19/2014  . Smokeless tobacco: Never Used     Comment: quit over a year.  . Alcohol use No   Current Outpatient Prescriptions  Medication Sig Dispense Refill  . aspirin EC 81 MG tablet Take 1 tablet (81 mg total) by mouth daily. 90 tablet 2  . pravastatin (PRAVACHOL) 40 MG tablet Take 1 tablet by mouth daily.  0  . triamterene-hydrochlorothiazide (MAXZIDE-25) 37.5-25 MG tablet Take 1 tablet by mouth daily. 90 tablet 0   No current facility-administered medications for this visit.    Allergies  Allergen Reactions  . Morphine  Swelling    REACTION: face swells  . Codeine Other (See Comments)    Avoids due to being recovery addict     Review of Systems: All systems reviewed and negative except where noted in HPI.   Lab Results  Component Value Date   WBC 5.1 08/29/2016   HGB 15.0 08/29/2016   HCT 44.3 08/29/2016   MCV 91.2 08/29/2016   PLT 191.0 08/29/2016    Lab Results  Component Value Date   CREATININE 1.20 (H) 06/19/2016   BUN 22 06/19/2016   NA 139 06/19/2016   K 3.5 06/19/2016   CL 104 06/19/2016   CO2 25 06/19/2016    Lab Results  Component Value Date   ALT 22 06/19/2016   AST 34 06/19/2016   ALKPHOS 106 06/19/2016   BILITOT 0.7 06/19/2016    Lab Results  Component Value Date   INR 1.2 (H) 08/29/2016   INR 1.2 (H) 01/31/2016   INR 1.02 07/07/2015     Physical Exam: BP 110/76   Pulse 72   Ht 5\' 1"  (1.549 m)   Wt 157 lb (71.2 kg)   LMP 01/22/2011   BMI 29.66 kg/m  Constitutional: Pleasant,well-developed, female in no acute distress. HEENT: Normocephalic and atraumatic. Conjunctivae are normal. No scleral icterus. Neck supple.  Cardiovascular: Normal rate, regular rhythm.  Pulmonary/chest: Effort normal and breath sounds normal. No wheezing, rales or rhonchi. Abdominal: Soft, nondistended, nontender.  There are no masses palpable. No hepatomegaly. Extremities: no edema. Prosthetic leg noted Lymphadenopathy: No cervical adenopathy noted. Neurological: Alert and oriented to person place and time. Skin: Skin is warm and dry. No rashes noted. Psychiatric: Normal mood and affect. Behavior is normal.   ASSESSMENT AND PLAN: 56 year old female with a history of hepatitis C of unknown duration, treated and eradicated with Harvoni, here for a follow-up visit for possible cirrhosis of the liver. She's had an elastography showing F4 changes, with changes on CT and [place] suggesting "mild cirrhosis". Her platelets are normal (repeated today) and INR mildly elevated. Her spleen appears  to be a normal size. If she has cirrhosis, it's well compensated. We discussed most definitive way to diagnosis cirrhosis would be with a liver biopsy, which we have discussed, but she did not want to proceed. Will continue with surveillance at this time every 6 months with US/AFP. I offered her an EGD for screening for varices, and she wanted to proceed with this after discussion of risks / benefits.   Otherwise, I reviewed results of CT scan with her regarding the pancreatic cyst. We'll plan for an MRCP in November 2018 for interval assessment. She agreed.   Remo Lipps  Havery Moros, MD [facility] Pager [phone]

## 2016-08-29 NOTE — Patient Instructions (Signed)
Your physician has requested that you go to the basement for the following lab work before leaving today: CBC/diff, PT/INR, AFP  You have been scheduled for an endoscopy. Please follow written instructions given to you at your visit today. If you use inhalers (even only as needed), please bring them with you on the day of your procedure. Your physician has requested that you go to www.startemmi.com and enter the access code given to you at your visit today. This web site gives a general overview about your procedure. However, you should still follow specific instructions given to you by our office regarding your preparation for the procedure.   We will put you in for an MRCP recall for November 2018.    I appreciate the opportunity to care for you.

## 2016-08-30 ENCOUNTER — Encounter: Payer: Self-pay | Admitting: Gastroenterology

## 2016-08-30 LAB — AFP TUMOR MARKER: AFP-Tumor Marker: 6.7 ng/mL — ABNORMAL HIGH (ref <6.1)

## 2016-08-31 ENCOUNTER — Other Ambulatory Visit: Payer: Self-pay | Admitting: Pulmonary Disease

## 2016-08-31 ENCOUNTER — Other Ambulatory Visit: Payer: Self-pay

## 2016-08-31 DIAGNOSIS — Z1231 Encounter for screening mammogram for malignant neoplasm of breast: Secondary | ICD-10-CM

## 2016-08-31 DIAGNOSIS — K7460 Unspecified cirrhosis of liver: Secondary | ICD-10-CM

## 2016-09-07 ENCOUNTER — Encounter: Payer: Self-pay | Admitting: *Deleted

## 2016-09-10 ENCOUNTER — Ambulatory Visit (AMBULATORY_SURGERY_CENTER): Payer: Commercial Managed Care - HMO | Admitting: Gastroenterology

## 2016-09-10 VITALS — BP 107/74 | HR 73 | Temp 97.3°F | Resp 20 | Ht 61.0 in | Wt 157.0 lb

## 2016-09-10 DIAGNOSIS — K2970 Gastritis, unspecified, without bleeding: Secondary | ICD-10-CM | POA: Diagnosis not present

## 2016-09-10 DIAGNOSIS — R932 Abnormal findings on diagnostic imaging of liver and biliary tract: Secondary | ICD-10-CM | POA: Diagnosis not present

## 2016-09-10 DIAGNOSIS — K2950 Unspecified chronic gastritis without bleeding: Secondary | ICD-10-CM | POA: Diagnosis not present

## 2016-09-10 DIAGNOSIS — B9681 Helicobacter pylori [H. pylori] as the cause of diseases classified elsewhere: Secondary | ICD-10-CM | POA: Diagnosis not present

## 2016-09-10 MED ORDER — SODIUM CHLORIDE 0.9 % IV SOLN: 500.0000 mL | INTRAVENOUS | Status: DC

## 2016-09-10 MED ORDER — OMEPRAZOLE 20 MG PO CPDR: 20.0000 mg | DELAYED_RELEASE_CAPSULE | Freq: Every day | ORAL | 3 refills | Status: DC

## 2016-09-10 NOTE — Progress Notes (Signed)
To PACU VSS Report to RN 

## 2016-09-10 NOTE — Progress Notes (Signed)
Pt's states no medical or surgical changes since previsit or office visit. 

## 2016-09-10 NOTE — Patient Instructions (Signed)
Impression/Recommendations:  Gastritis handout given to patient.  Resume previous diet. Continue present medications. Await pathology results. Minimize NSAID use. Begin Omeprazole 20 mg. Once daily.  Repeat EGD in a few years for screening purposes.  YOU HAD AN ENDOSCOPIC PROCEDURE TODAY AT [facility]:   Refer to the procedure report that was given to you for any specific questions about what was found during the examination.  If the procedure report does not answer your questions, please call your gastroenterologist to clarify.  If you requested that your care partner not be given the details of your procedure findings, then the procedure report has been included in a sealed envelope for you to review at your convenience later.  YOU SHOULD EXPECT: Some feelings of bloating in the abdomen. Passage of more gas than usual.  Walking can help get rid of the air that was put into your GI tract during the procedure and reduce the bloating. If you had a lower endoscopy (such as a colonoscopy or flexible sigmoidoscopy) you may notice spotting of blood in your stool or on the toilet paper. If you underwent a bowel prep for your procedure, you may not have a normal bowel movement for a few days.  Please Note:  You might notice some irritation and congestion in your nose or some drainage.  This is from the oxygen used during your procedure.  There is no need for concern and it should clear up in a day or so.  SYMPTOMS TO REPORT IMMEDIATELY:  Following upper endoscopy (EGD)  Vomiting of blood or coffee ground material  New chest pain or pain under the shoulder blades  Painful or persistently difficult swallowing  New shortness of breath  Fever of 100F or higher  Black, tarry-looking stools  For urgent or emergent issues, a gastroenterologist can be reached at any hour by calling [phone].   DIET:  We do recommend a small meal at first, but then you may proceed to your  regular diet.  Drink plenty of fluids but you should avoid alcoholic beverages for 24 hours.  ACTIVITY:  You should plan to take it easy for the rest of today and you should NOT DRIVE or use heavy machinery until tomorrow (because of the sedation medicines used during the test).    FOLLOW UP: Our staff will call the number listed on your records the next business day following your procedure to check on you and address any questions or concerns that you may have regarding the information given to you following your procedure. If we do not reach you, we will leave a message.  However, if you are feeling well and you are not experiencing any problems, there is no need to return our call.  We will assume that you have returned to your regular daily activities without incident.  If any biopsies were taken you will be contacted by phone or by letter within the next 1-3 weeks.  Please call us at [phone] if you have not heard about the biopsies in 3 weeks.    SIGNATURES/CONFIDENTIALITY: You and/or your care partner have signed paperwork which will be entered into your electronic medical record.  These signatures attest to the fact that that the information above on your After Visit Summary has been reviewed and is understood.  Full responsibility of the confidentiality of this discharge information lies with you and/or your care-partner.

## 2016-09-10 NOTE — Progress Notes (Signed)
Called to room to assist during endoscopic procedure.  Patient ID and intended procedure confirmed with present staff. Received instructions for my participation in the procedure from the performing physician.  

## 2016-09-10 NOTE — Op Note (Signed)
[facility] Patient Name: Stacy Thomas Procedure Date: 09/10/2016 7:56 AM MRN: [id number] Endoscopist: Remo Lipps P. Armbruster MD, MD Age: 56 Referring MD:  Date of Birth: June 24, 1960 Gender: Female Account #: [id number] Procedure:                Upper GI endoscopy Indications:              suspected cirrhosis, rule out esophageal varices Medicines:                Monitored Anesthesia Care Procedure:                Pre-Anesthesia Assessment:                           - Prior to the procedure, a History and Physical                            was performed, and patient medications and                            allergies were reviewed. The patient's tolerance of                            previous anesthesia was also reviewed. The risks                            and benefits of the procedure and the sedation                            options and risks were discussed with the patient.                            All questions were answered, and informed consent                            was obtained. Prior Anticoagulants: The patient has                            taken aspirin, last dose was 1 day prior to                            procedure. ASA Grade Assessment: II - A patient                            with mild systemic disease. After reviewing the                            risks and benefits, the patient was deemed in                            satisfactory condition to undergo the procedure.                           After obtaining informed consent, the endoscope was  passed under direct vision. Throughout the                            procedure, the patient's blood pressure, pulse, and                            oxygen saturations were monitored continuously. The                            Endoscope was introduced through the mouth, and                            advanced to the second part of duodenum. The upper   GI endoscopy was accomplished without difficulty.                            The patient tolerated the procedure well. Scope In: Scope Out: Findings:                 Esophagogastric landmarks were identified: the                            Z-line was found at 34 cm, the gastroesophageal                            junction was found at 34 cm and the upper extent of                            the gastric folds was found at 37 cm from the                            incisors.                           The Z-line was very slightly irregular with a 1-64mm                            island of salmon colored mucosa without nodularity,                            and was found 34 cm from the incisors.                           A single small area of ectopic gastric mucosa was                            found in the proximal esophagus.                           The exam of the esophagus was otherwise normal. No                            esophageal varices.  Patchy moderate inflammation characterized by                            erosions, erythema and friability was found in the                            gastric antrum. Biopsies were taken with a cold                            forceps for histology and Helicobacter pylori                            testing.                           The exam of the stomach was otherwise normal.                           Patchy mildly erythematous mucosa was found in the                            duodenal bulb.                           The exam of the duodenum was otherwise normal. Complications:            No immediate complications. Estimated blood loss:                            Minimal. Estimated Blood Loss:     Estimated blood loss was minimal. Impression:               - Esophagogastric landmarks identified.                           - Z-line 34 cm from the incisors as above.                           - Ectopic gastric mucosa in  the proximal esophagus.                           - No esophageal varices                           - Gastritis. Biopsied.                           - Erythematous duodenopathy. Recommendation:           - Patient has a contact number available for                            emergencies. The signs and symptoms of potential                            delayed complications were discussed with the  patient. Return to normal activities tomorrow.                            Written discharge instructions were provided to the                            patient.                           - Resume previous diet.                           - Continue present medications.                           - Await pathology results.                           - Minimize NSAID use                           - Start omeprazole 20mg  once daily for gastritis                           - Repeat EGD in a few years for screening purposes Steven P. Armbruster MD, MD 09/10/2016 8:16:47 AM This report has been signed electronically.

## 2016-09-11 ENCOUNTER — Telehealth: Payer: Self-pay | Admitting: *Deleted

## 2016-09-11 NOTE — Telephone Encounter (Signed)
  Follow up Call-  Call back number 09/10/2016  Post procedure Call Back phone  # [phone]  Permission to leave phone message Yes  Some recent data might be hidden     Patient questions:   Message left to call us. Second call.

## 2016-09-11 NOTE — Telephone Encounter (Signed)
   Follow up Call-  Call back number 09/10/2016  Post procedure Call Back phone  # [phone]  Permission to leave phone message Yes  Some recent data might be hidden     Patient questions:  Message left to call us if necessary.

## 2016-09-14 ENCOUNTER — Ambulatory Visit
Admission: RE | Admit: 2016-09-14 | Discharge: 2016-09-14 | Disposition: A | Discharge status: Home / Self Care | Payer: Commercial Managed Care - HMO | Source: Ambulatory Visit | Attending: Family Medicine | Admitting: Family Medicine

## 2016-09-14 DIAGNOSIS — Z1231 Encounter for screening mammogram for malignant neoplasm of breast: Secondary | ICD-10-CM

## 2016-09-17 ENCOUNTER — Telehealth: Payer: Self-pay | Admitting: Gastroenterology

## 2016-09-17 ENCOUNTER — Other Ambulatory Visit: Payer: Self-pay

## 2016-09-17 DIAGNOSIS — A048 Other specified bacterial intestinal infections: Secondary | ICD-10-CM

## 2016-09-17 MED ORDER — BIS SUBCIT-METRONID-TETRACYC 140-125-125 MG PO CAPS: 3.0000 | ORAL_CAPSULE | Freq: Three times a day (TID) | ORAL | 0 refills | Status: DC

## 2016-09-18 ENCOUNTER — Other Ambulatory Visit: Payer: Self-pay | Admitting: Family Medicine

## 2016-09-18 DIAGNOSIS — R928 Other abnormal and inconclusive findings on diagnostic imaging of breast: Secondary | ICD-10-CM

## 2016-09-19 NOTE — Telephone Encounter (Signed)
Left message for pt informing that the drug rep will be bringing samples of Pylera by Friday and that I would call her to let her know when the sample is ready for her to pick up.

## 2016-09-19 NOTE — Telephone Encounter (Signed)
Tried calling pt again. Had to leave voicemail. Sample of Pylera left up front and pt informed to come by a pick up.

## 2016-09-20 ENCOUNTER — Other Ambulatory Visit: Payer: Commercial Managed Care - HMO

## 2016-09-20 ENCOUNTER — Ambulatory Visit
Admission: RE | Admit: 2016-09-20 | Discharge: 2016-09-20 | Disposition: A | Discharge status: Home / Self Care | Payer: Commercial Managed Care - HMO | Source: Ambulatory Visit | Attending: Family Medicine | Admitting: Family Medicine

## 2016-09-20 DIAGNOSIS — R928 Other abnormal and inconclusive findings on diagnostic imaging of breast: Secondary | ICD-10-CM

## 2016-09-21 NOTE — Telephone Encounter (Signed)
Left message for pt. Instructed that Pylera was the preferred medication to eliminate her infection.

## 2016-09-25 ENCOUNTER — Telehealth: Payer: Self-pay | Admitting: Gastroenterology

## 2016-09-25 NOTE — Telephone Encounter (Signed)
Agree with your recommendation. Pylera can cause nausea but hopefully she can tolerate it along with her PPI. Thanks

## 2016-09-25 NOTE — Telephone Encounter (Signed)
Spoke to patient and she was concerned that her stools were dark and urine an orangish color. I told her this is normal while on Pylera. She also said she was nauseated, she has not been taking her omeprazole while on Pylera. Told her that she needs to take this twice a day, as directed on the box and as she has been instructed. She has not had her Pylera today, told her that she will not be able to get 4 doses in today but should be able to get in 3 doses, will need to take her omeprazole. Patient said that yesterday she got into her car and "couldn't remember how to turn it on", patient got home alright. No further issues today. I told her that she needed to contact her primary care physician to discuss this with them, this is not a GI related problem.

## 2016-09-25 NOTE — Telephone Encounter (Signed)
Left message that I am returning her call. 

## 2016-10-08 ENCOUNTER — Ambulatory Visit (INDEPENDENT_AMBULATORY_CARE_PROVIDER_SITE_OTHER): Payer: Commercial Managed Care - HMO | Admitting: Orthopedic Surgery

## 2016-10-29 ENCOUNTER — Encounter: Payer: Self-pay | Admitting: Internal Medicine

## 2016-10-29 ENCOUNTER — Ambulatory Visit (INDEPENDENT_AMBULATORY_CARE_PROVIDER_SITE_OTHER): Payer: 59 | Admitting: Internal Medicine

## 2016-10-29 VITALS — BP 142/91 | HR 68 | Temp 98.6°F | Ht 61.5 in | Wt 159.0 lb

## 2016-10-29 DIAGNOSIS — I10 Essential (primary) hypertension: Secondary | ICD-10-CM

## 2016-10-29 DIAGNOSIS — K7460 Unspecified cirrhosis of liver: Secondary | ICD-10-CM

## 2016-10-29 DIAGNOSIS — Z79899 Other long term (current) drug therapy: Secondary | ICD-10-CM

## 2016-10-29 DIAGNOSIS — Z7982 Long term (current) use of aspirin: Secondary | ICD-10-CM

## 2016-10-29 DIAGNOSIS — Z87891 Personal history of nicotine dependence: Secondary | ICD-10-CM | POA: Diagnosis not present

## 2016-10-29 DIAGNOSIS — G5603 Carpal tunnel syndrome, bilateral upper limbs: Secondary | ICD-10-CM

## 2016-10-29 DIAGNOSIS — K120 Recurrent oral aphthae: Secondary | ICD-10-CM | POA: Insufficient documentation

## 2016-10-29 DIAGNOSIS — Z8249 Family history of ischemic heart disease and other diseases of the circulatory system: Secondary | ICD-10-CM

## 2016-10-29 MED ORDER — TRIAMTERENE-HCTZ 37.5-25 MG PO TABS: 1.0000 | ORAL_TABLET | Freq: Every day | ORAL | 3 refills | Status: DC

## 2016-10-29 NOTE — Assessment & Plan Note (Addendum)
Patient is unable to afford surgery at this time. She has tried non-surgical management, including use of OTC pain medications and use of splints at night. She mainly gets pain with use of her crutches in the evening while not wearing her prosthetic. The patient cannot take NSAIDs at this time, given that her Cr has increased on previous BMPs, and was told to continue her PRN use of Tylenol. She was given a referral to sports medicine, as steroid injections have been helpful for her R hip pain, and told to return to the clinic if her symptoms persist or worsen in the coming months.  Plan: -OTC Tylenol for pain management -Referral to sports medicine for steroid injection

## 2016-10-29 NOTE — Assessment & Plan Note (Signed)
Patient had 1 large lesion on underside of tongue consistent with aphthous ulcer. She was instructed to stop use of alcohol mouthwash, which can be irritating to this lesion, and to begin use of OTC oral benzocaine solutions to help numb her tongue while the lesion heals. She was given the brand and generic name of medications that she can pick up at her pharmacy and told to follow up if the lesions persist/worsen over the next few weeks.  Plan: -Supportive care with OTC oral benzocaine cream -RTC if symptoms do not improve in 2-4 weeks

## 2016-10-29 NOTE — Patient Instructions (Addendum)
Thank you for seeing [place] today!  Please keep taking your blood pressure medication each day.  Please use Orabase (over the counter benzocaine cream by [employer]) for your tongue pain. Avoid using mouthwash containing alcohol, as it may aggravate your tongue while it heals. Please call the clinic if this problem does not improve over the next few weeks.  Follow up in 6 months with PCP Dr Berneice Gandy or sooner if needed.

## 2016-10-29 NOTE — Progress Notes (Signed)
CC: HTN follow up  HPI:  [name] is a 56 y.o. with a PMH of Hepatits C s/p treatment on 09/13/2015, HTN, and HLD who is being seeing in clinic today for management of her chronic medical conditions. Per chart review the patient was last evaluated by GI on 09/10/2016 for a screening EGD after an abnormal liver ultrasound showed mild cirrhosis. Her EGD did not show any evidence of esophageal varices, but did show mild gastritis and a H. Pylori infection. The patient was started on omperazole 20 mg daily and a 10 day course of Pylera. The patient did not understand these results and was counseled about these results during today's visit. The patient was able to finish taking this medications and has plans to follow up with GI regarding the treatment of this condition.  Since her last visit the patient has felt well, but is overall anxious about her health. She has been unable to take her hypertension medications for the past two weeks 2/2 financial limitations. She states that she also had to cancel her surgery to help with bilateral carpal tunnel syndrome 2/2 financial limitations, as she could not afford the co-pay associated with this procedure. The patient continues to endorse R hip pain, for which she has been to sports medicine and receives steroid injections that are generally well tolerated and useful for managing her pain. She also complains of sores on her tongue, which she first noticed about 2 months ago. She states that they make it painful to eat and brush her teeth. She has been using alcohol containing mouthwash in the past 2 weeks to help heal the sores, but she thinks they have gotten worse.  Past Medical History:  Diagnosis Date  . Bilateral arm pain    chronic  . Chronic hepatitis C ([place]) followed by dr comer at infectious disease   Genotype 1a--  currently on Harvoni started 09-13-2015  . Dependence on crutches    left leg prosthesis  . Drug addiction in remission ([place])     since 2007  (crack cocaine,  IV drug use)  . External hemorrhoid, bleeding   . Hand weakness   . History of esophageal stricture    post dilation x2  . History of osteosarcoma    1982  left knee---  s/p  AKA LLE--   per pt no recurrence  . History of recreational drug use multiple rehab visits   IV drug use for 1 yr and Smoked crack for 30+yrs--  per pt clean since 2007  . Hyperlipidemia   . Hypertension   . Idiopathic peripheral neuropathy   . Osteoarthritis   . Prolapsed internal hemorrhoids, grade 4   . Sigmoid diverticulosis    mild   Review of Systems:   Patient endorses R hip pain, with bilateral wrist pain and 3-5th digit tingling/numbness, and oral lesions, as per HPI Patient denies radiating chest pain, shortness of breath, abdominal pain, diaphoresis, nausea/vomiting, lower extremity swelling, and change in bowel/bladder habits.  Physical Exam:  Vitals:   10/29/16 1533 10/29/16 1541  BP: (!) 143/86 (!) 142/91  Pulse: 62 68  Temp: 98.6 F (37 C)   TempSrc: Oral   SpO2: 97%   Weight: 159 lb (72.1 kg)   Height: 5' 1.5" (1.562 m)    Physical Exam  Constitutional: She appears well-developed and well-nourished. No distress.  HENT:  Oropharynx moist. Good dentition. Patient has <1 cm, round and well circumscribed lesion on underside of tongue. Lesion has  white borders with central area of erythema/ulceration.   Cardiovascular: Normal rate, regular rhythm and normal heart sounds.  Exam reveals no friction rub.   No murmur heard. Pulmonary/Chest: Effort normal and breath sounds normal. No respiratory distress. She has no wheezes. She has no rales.  Abdominal: Soft. Bowel sounds are normal. She exhibits no distension. There is no tenderness. There is no guarding.  Musculoskeletal: She exhibits no edema (of R lower extremity) or tenderness (of R lower extremity).  The patient has L leg amputation with prosthetic in place. The patient easily walks without the use of  crutches to and from exam table.   Skin: Skin is warm and dry. Capillary refill takes less than 2 seconds. No rash noted. No erythema.  Psychiatric: She has a normal mood and affect. Her behavior is normal. Thought content normal.   Assessment & Plan:   See Encounters Tab for problem based charting.  Patient seen with Dr. Lynnae January

## 2016-10-29 NOTE — Assessment & Plan Note (Signed)
The patient's EGD results were discussed with her today and she was encouraged to continue follow up with her GI doctor regarding resolution of H.Pylori infection and HCC screening.

## 2016-10-29 NOTE — Assessment & Plan Note (Signed)
Patient's BP above goal of 140/90 today. She has not taken BP medication 2/2 financial limitations in 2 weeks. The patient was previously stable on triamterene-hydrochlorothiazide 37.5-25 mg and this will be restarted today. This medication is on $4 at [facility] and she was given a 90 day supply to promote adherence to medication regimen.  Plan: -Restart triamterene-HCTZ 37.5-25 mg -Reassess BP at follow up

## 2016-10-30 NOTE — Progress Notes (Signed)
Internal Medicine Clinic Attending  I saw and evaluated the patient.  I personally confirmed the key portions of the history and exam documented by Dr. Nedrud and I reviewed pertinent patient test results.  The assessment, diagnosis, and plan were formulated together and I agree with the documentation in the resident's note.  

## 2016-10-31 ENCOUNTER — Other Ambulatory Visit: Payer: 59

## 2016-10-31 DIAGNOSIS — A048 Other specified bacterial intestinal infections: Secondary | ICD-10-CM

## 2016-11-01 LAB — HELICOBACTER PYLORI  SPECIAL ANTIGEN: H. PYLORI ANTIGEN STOOL: NOT DETECTED

## 2016-11-12 ENCOUNTER — Encounter: Payer: Self-pay | Admitting: *Deleted

## 2016-11-20 ENCOUNTER — Encounter: Payer: Self-pay | Admitting: Internal Medicine

## 2016-11-21 ENCOUNTER — Other Ambulatory Visit: Payer: Self-pay | Admitting: *Deleted

## 2016-11-21 DIAGNOSIS — I739 Peripheral vascular disease, unspecified: Secondary | ICD-10-CM

## 2016-11-22 MED ORDER — PRAVASTATIN SODIUM 40 MG PO TABS: 40.0000 mg | ORAL_TABLET | Freq: Every day | ORAL | 1 refills | Status: DC

## 2016-11-22 MED ORDER — ASPIRIN EC 81 MG PO TBEC: 81.0000 mg | DELAYED_RELEASE_TABLET | Freq: Every day | ORAL | 1 refills | Status: AC

## 2016-12-13 NOTE — Addendum Note (Signed)
Addended by: Hulan Fray on: 12/13/2016 07:50 AM   Modules accepted: Orders

## 2016-12-21 ENCOUNTER — Telehealth (INDEPENDENT_AMBULATORY_CARE_PROVIDER_SITE_OTHER): Payer: Self-pay | Admitting: Physical Medicine and Rehabilitation

## 2016-12-21 NOTE — Telephone Encounter (Signed)
Scheduled for 12/31/16 at 0915.

## 2016-12-21 NOTE — Telephone Encounter (Signed)
ok 

## 2016-12-31 ENCOUNTER — Ambulatory Visit (INDEPENDENT_AMBULATORY_CARE_PROVIDER_SITE_OTHER): Payer: Commercial Managed Care - HMO

## 2016-12-31 ENCOUNTER — Encounter (INDEPENDENT_AMBULATORY_CARE_PROVIDER_SITE_OTHER): Payer: Self-pay | Admitting: Physical Medicine and Rehabilitation

## 2016-12-31 ENCOUNTER — Ambulatory Visit (INDEPENDENT_AMBULATORY_CARE_PROVIDER_SITE_OTHER): Payer: 59 | Admitting: Physical Medicine and Rehabilitation

## 2016-12-31 VITALS — BP 113/76 | HR 85

## 2016-12-31 DIAGNOSIS — M25551 Pain in right hip: Secondary | ICD-10-CM

## 2016-12-31 MED ORDER — BUPIVACAINE HCL 0.5 % IJ SOLN
3.0000 mL | INTRAMUSCULAR | Status: AC | PRN
  Administered 2016-12-31: 3 mL via INTRA_ARTICULAR

## 2016-12-31 MED ORDER — TRIAMCINOLONE ACETONIDE 40 MG/ML IJ SUSP
80.0000 mg | INTRAMUSCULAR | Status: AC | PRN
  Administered 2016-12-31: 80 mg via INTRA_ARTICULAR

## 2016-12-31 NOTE — Progress Notes (Signed)
Right hip-recurrent pain-previous injection on 05/08/16 gave good relief for about 7 months. States has fallen twice in the last 3 weeks.

## 2016-12-31 NOTE — Patient Instructions (Signed)

## 2016-12-31 NOTE — Progress Notes (Signed)
Stacy Thomas - 56 y.o. female MRN [id number]  Date of birth: [birth year]  Office Visit Note: Visit Date: 12/31/2016 PCP: Thomasene Ripple, MD Referred by: Thomasene Ripple, MD  Subjective: No chief complaint on file.  HPI: Stacy Thomas is a 56 year old female with bilateral hip osteoarthritis. The right side is worse than the left. Prior injection in February gave her quite a bit relief up until just several aunts ago. A repeat the injection today diagnostically and therapeutically. She'll follow-up Dr. Sharol Given for orthopedic care. Her cases, the above prior left below-the-knee amputation with prosthetic. She also has history of chronic liver disease.    ROS Otherwise per HPI.  Assessment & Plan: Visit Diagnoses:  1. Pain in right hip     Plan: Findings:  Intra-articular hip injection with fluoroscopic guidance. Patient did have some relief can anesthetic phase. She still ambulates with an antalgic gait with a almost Trendelenburg gait.    Meds & Orders: No orders of the defined types were placed in this encounter.   Orders Placed This Encounter  Procedures  . Large Joint Injection/Arthrocentesis  . XR C-ARM NO REPORT    Follow-up: Return if symptoms worsen or fail to improve.   Procedures: Intra-articular injection with fluoroscopic guidance. Date/Time: 12/31/2016 9:17 AM Performed by: Magnus Sinning Authorized by: Magnus Sinning   Consent Given by:  Patient Site marked: the procedure site was marked   Timeout: prior to procedure the correct patient, procedure, and site was verified   Indications:  Pain and diagnostic evaluation Location:  Hip Site:  R hip joint Prep: patient was prepped and draped in usual sterile fashion   Needle Size:  22 G Needle Length:  3.5 inches Approach:  Anterior Ultrasound Guidance: No   Fluoroscopic Guidance: Yes   Arthrogram: No   Medications:  3 mL bupivacaine 0.5 %; 80 mg triamcinolone acetonide 40 MG/ML Aspiration Attempted: Yes    Patient tolerance:  Patient tolerated the procedure well with no immediate complications  There was excellent flow of contrast producing a partial arthrogram of the hip. The patient did have relief of symptoms during the anesthetic phase of the injection.     No notes on file   Clinical History: No specialty comments available.  She reports that she quit smoking about 2 years ago. Her smoking use included Cigarettes. She started smoking about 43 years ago. She has a 37.00 pack-year smoking history. She has never used smokeless tobacco.   Recent Labs  03/05/16 1548  HGBA1C 5.4    Objective:  VS:  HT:    WT:   BMI:     BP:113/76  HR:85bpm  TEMP: ( )  RESP:  Physical Exam  Musculoskeletal:  Patient ambulates without aid. She does ambulate with a antalgic almost Trendelenburg gait. She does have pain with hip rotation internally.    Ortho Exam Imaging: Xr C-arm No Report  Result Date: 12/31/2016 Please see Notes or Procedures tab for imaging impression.   Past Medical/Family/Surgical/Social History: Medications & Allergies reviewed per EMR Patient Active Problem List   Diagnosis Date Noted  . Aphthous ulcer of tongue 10/29/2016  . Vitamin D deficiency 05/04/2016  . Bilateral primary osteoarthritis of hip 04/26/2016  . Bilateral carpal tunnel syndrome 03/29/2016  . Paresthesia of skin 03/23/2016  . Atrophic vaginitis 03/06/2016  . Screening for cervical cancer 03/06/2016  . PVD (peripheral vascular disease) ([place]) 03/05/2016  . Pancreatic abnormality 03/05/2016  . HTN (hypertension) 03/05/2016  . Hepatic cirrhosis ([place]) 11/15/2015  .  External hemorrhoids with pain & bleeding 10/27/2015  . Chronic hepatitis C ([place]) 06/15/2015  . Transaminitis 06/14/2015  . Prolapsed internal hemorrhoids, grade 4 06/14/2015  . Peripheral neuropathy 06/14/2015  . Unilateral AKA ([place])- left 06/14/2015  . Hx of osteosarcoma 06/14/2015  . DYSPHAGIA 06/13/2007  . ABDOMINAL PAIN-LUQ  06/13/2007   Past Medical History:  Diagnosis Date  . Bilateral arm pain    chronic  . Chronic hepatitis C ([place]) followed by dr comer at infectious disease   Genotype 1a--  currently on Harvoni started 09-13-2015  . Dependence on crutches    left leg prosthesis  . Drug addiction in remission ([place])    since 2007  (crack cocaine,  IV drug use)  . External hemorrhoid, bleeding   . Hand weakness   . History of esophageal stricture    post dilation x2  . History of osteosarcoma    1982  left knee---  s/p  AKA LLE--   per pt no recurrence  . History of recreational drug use multiple rehab visits   IV drug use for 1 yr and Smoked crack for 30+yrs--  per pt clean since 2007  . Hyperlipidemia   . Hypertension   . Idiopathic peripheral neuropathy   . Osteoarthritis   . Prolapsed internal hemorrhoids, grade 4   . Sigmoid diverticulosis    mild   Family History  Problem Relation Age of Onset  . COPD Father   . Alcohol abuse Father        Deceased  . Hypertension Mother        Living  . Healthy Daughter   . Healthy Son   . Anesthesia problems Neg Hx   . Breast cancer Neg Hx    Past Surgical History:  Procedure Laterality Date  . ABOVE KNEE LEG AMPUTATION Left 1982   osteosarcoma  . COLONOSCOPY  last one 02-28-2009  . ESOPHAGOGASTRODUODENOSCOPY (EGD) WITH ESOPHAGEAL DILATION  x2  last one 04-28-2008  . EXCISION BREAST BX  2015   CYST  . TONSILLECTOMY  as child  . TRANSANAL HEMORRHOIDAL DEARTERIALIZATION N/A 10/27/2015   Procedure: TRANSANAL HEMORRHOIDAL DEARTERIALIZATION;  Surgeon: Michael Boston, MD;  Location: [facility];  Service: General;  Laterality: N/A;   Social History   Occupational History  . Not on file.   Social History Main Topics  . Smoking status: Former Smoker    Packs/day: 1.00    Years: 37.00    Types: Cigarettes    Start date: 04/02/1973    Quit date: 12/19/2014  . Smokeless tobacco: Never Used     Comment: quit over a year.  . Alcohol  use No  . Drug use: No     Comment: recovery addict since 2007  . Sexual activity: Not on file

## 2017-01-31 ENCOUNTER — Other Ambulatory Visit: Payer: Self-pay

## 2017-01-31 ENCOUNTER — Telehealth: Payer: Self-pay

## 2017-01-31 DIAGNOSIS — K862 Cyst of pancreas: Secondary | ICD-10-CM

## 2017-01-31 NOTE — Telephone Encounter (Signed)
Order placed for MRCP follow up. Left message for patient to expect a call from [facility] to schedule this exam. If she has questions or concerns she needs to call our office back.

## 2017-01-31 NOTE — Telephone Encounter (Signed)
-----   Message from Doristine Counter, RN sent at 02/09/2016  2:32 PM EST ----- Schedule MRCP one year f/u pancreatic cyst

## 2017-02-08 ENCOUNTER — Telehealth (INDEPENDENT_AMBULATORY_CARE_PROVIDER_SITE_OTHER): Payer: Self-pay | Admitting: Orthopedic Surgery

## 2017-02-08 NOTE — Telephone Encounter (Signed)
Patient states she does not have any money to make an appt but would like an RX for a knee replacement for her artificial limb since it is broken. Patients # to call back is [phone].

## 2017-02-10 ENCOUNTER — Ambulatory Visit
Admission: RE | Admit: 2017-02-10 | Discharge: 2017-02-10 | Disposition: A | Discharge status: Home / Self Care | Payer: 59 | Source: Ambulatory Visit | Attending: Gastroenterology | Admitting: Gastroenterology

## 2017-02-10 DIAGNOSIS — K862 Cyst of pancreas: Secondary | ICD-10-CM

## 2017-02-10 MED ORDER — GADOBENATE DIMEGLUMINE 529 MG/ML IV SOLN
15.0000 mL | Freq: Once | INTRAVENOUS | Status: AC | PRN
  Administered 2017-02-10: 15 mL via INTRAVENOUS

## 2017-02-12 ENCOUNTER — Other Ambulatory Visit: Payer: Self-pay

## 2017-02-12 NOTE — Telephone Encounter (Signed)
Faxing script to [facility] this morning at [phone].

## 2017-02-12 NOTE — Addendum Note (Signed)
Addended by: Doristine Counter on: 02/12/2017 10:21 AM   Modules accepted: Orders

## 2017-02-14 ENCOUNTER — Encounter: Payer: Self-pay | Admitting: Gastroenterology

## 2017-02-14 ENCOUNTER — Telehealth (INDEPENDENT_AMBULATORY_CARE_PROVIDER_SITE_OTHER): Payer: Self-pay | Admitting: Orthopedic Surgery

## 2017-02-14 NOTE — Telephone Encounter (Signed)
Patient believes her insurance needs to be contacted, [employer] to pay for the knee replacement of her artificial limb that is broken. I let her know that the script was faxed to [place] but she seems confused, if you could call and advise -  [phone]

## 2017-02-14 NOTE — Telephone Encounter (Signed)
Error- encounter on previous call

## 2017-02-15 NOTE — Telephone Encounter (Signed)
I called and spoke with Barbera Setters from [employer], unfortunately [facility] patients insurance requires a face to face encounter, an office visit, for prosthetic supplies. I called to make patient aware, scheduled appointment for Monday. She is needing a hydraulic knee, her previous prosthetic is worn down and causing her difficulty with ADLs.

## 2017-02-18 ENCOUNTER — Ambulatory Visit (INDEPENDENT_AMBULATORY_CARE_PROVIDER_SITE_OTHER): Payer: 59 | Admitting: Orthopedic Surgery

## 2017-02-18 ENCOUNTER — Encounter (INDEPENDENT_AMBULATORY_CARE_PROVIDER_SITE_OTHER): Payer: Self-pay | Admitting: Orthopedic Surgery

## 2017-02-18 DIAGNOSIS — Z89612 Acquired absence of left leg above knee: Secondary | ICD-10-CM | POA: Diagnosis not present

## 2017-02-18 DIAGNOSIS — M1711 Unilateral primary osteoarthritis, right knee: Secondary | ICD-10-CM | POA: Insufficient documentation

## 2017-02-18 MED ORDER — METHYLPREDNISOLONE ACETATE 40 MG/ML IJ SUSP
40.0000 mg | INTRAMUSCULAR | Status: AC | PRN
  Administered 2017-02-18: 40 mg via INTRA_ARTICULAR

## 2017-02-18 MED ORDER — LIDOCAINE HCL 1 % IJ SOLN
5.0000 mL | INTRAMUSCULAR | Status: AC | PRN
  Administered 2017-02-18: 5 mL

## 2017-02-18 NOTE — Progress Notes (Signed)
Office Visit Note   Patient: Stacy Thomas           Date of Birth: 1960/11/10           MRN: [id number] Visit Date: 02/18/2017              Requested by: Thomasene Ripple, MD [address] [place], [place] [address] PCP: Thomasene Ripple, MD  Chief Complaint  Patient presents with  . Right Knee - Pain  . Left Knee - Pain      HPI: Patient is a 56 year old woman who has instability with the artificial knee for her above-knee prosthesis on the left.  The knee has given way several times with the patient falling.  She states that the knee is broken.  Patient also complains of osteoarthritis of her right hip and osteoarthritis of her right knee.  Patient states that her hip pain has resolved with a hip injection.  She has had good relief with knee injections in the past.  Assessment & Plan: Visit Diagnoses:  1. Unilateral primary osteoarthritis, right knee   2. Hx of AKA (above knee amputation), left ([place])     Plan: Patient was given a prescription for a new hydraulic knee for her left above-the-knee prosthesis for a biotech.  Patient's right knee was injected.  Follow-up as needed.  Follow-Up Instructions: Return if symptoms worsen or fail to improve.   Ortho Exam  Patient is alert, oriented, no adenopathy, well-dressed, normal affect, normal respiratory effort. Examination patient has an antalgic gait.  She has an unstable knee of her left above-knee amputation prosthesis.  Patient has no ulcers on the residual limb.  Examination of the right knee she has a mild effusion she is tender to palpation of the medial and lateral joint lines.  Collaterals and cruciates are stable she is tender to palpation in the patellofemoral joint as well.  Imaging: No results found. No images are attached to the encounter.  Labs: Lab Results  Component Value Date   HGBA1C 5.4 03/05/2016    Orders:  No orders of the defined types were placed in this encounter.  No orders of the defined  types were placed in this encounter.    Procedures: Large Joint Inj: R knee on 02/18/2017 9:14 AM Indications: pain and diagnostic evaluation Details: 22 G 1.5 in needle, anteromedial approach  Arthrogram: No  Medications: 5 mL lidocaine 1 %; 40 mg methylPREDNISolone acetate 40 MG/ML Outcome: tolerated well, no immediate complications Procedure, treatment alternatives, risks and benefits explained, specific risks discussed. Consent was given by the patient. Immediately prior to procedure a time out was called to verify the correct patient, procedure, equipment, support staff and site/side marked as required. Patient was prepped and draped in the usual sterile fashion.      Clinical Data: No additional findings.  ROS:  All other systems negative, except as noted in the HPI. Review of Systems  Objective: Vital Signs: LMP 01/22/2011   Specialty Comments:  No specialty comments available.  PMFS History: Patient Active Problem List   Diagnosis Date Noted  . Hx of AKA (above knee amputation), left ([place]) 02/18/2017  . Unilateral primary osteoarthritis, right knee 02/18/2017  . Aphthous ulcer of tongue 10/29/2016  . Vitamin D deficiency 05/04/2016  . Bilateral primary osteoarthritis of hip 04/26/2016  . Bilateral carpal tunnel syndrome 03/29/2016  . Paresthesia of skin 03/23/2016  . Atrophic vaginitis 03/06/2016  . Screening for cervical cancer 03/06/2016  . PVD (peripheral vascular disease) (  [place]) 03/05/2016  . Pancreatic abnormality 03/05/2016  . HTN (hypertension) 03/05/2016  . Hepatic cirrhosis ([place]) 11/15/2015  . External hemorrhoids with pain & bleeding 10/27/2015  . Chronic hepatitis C ([place]) 06/15/2015  . Transaminitis 06/14/2015  . Prolapsed internal hemorrhoids, grade 4 06/14/2015  . Peripheral neuropathy 06/14/2015  . Unilateral AKA ([place])- left 06/14/2015  . Hx of osteosarcoma 06/14/2015  . DYSPHAGIA 06/13/2007  . ABDOMINAL PAIN-LUQ 06/13/2007   Past Medical  History:  Diagnosis Date  . Bilateral arm pain    chronic  . Chronic hepatitis C ([place]) followed by dr comer at infectious disease   Genotype 1a--  currently on Harvoni started 09-13-2015  . Dependence on crutches    left leg prosthesis  . Drug addiction in remission ([place])    since 2007  (crack cocaine,  IV drug use)  . External hemorrhoid, bleeding   . Hand weakness   . History of esophageal stricture    post dilation x2  . History of osteosarcoma    1982  left knee---  s/p  AKA LLE--   per pt no recurrence  . History of recreational drug use multiple rehab visits   IV drug use for 1 yr and Smoked crack for 30+yrs--  per pt clean since 2007  . Hyperlipidemia   . Hypertension   . Idiopathic peripheral neuropathy   . Osteoarthritis   . Prolapsed internal hemorrhoids, grade 4   . Sigmoid diverticulosis    mild    Family History  Problem Relation Age of Onset  . COPD Father   . Alcohol abuse Father        Deceased  . Hypertension Mother        Living  . Healthy Daughter   . Healthy Son   . Anesthesia problems Neg Hx   . Breast cancer Neg Hx     Past Surgical History:  Procedure Laterality Date  . ABOVE KNEE LEG AMPUTATION Left 1982   osteosarcoma  . COLONOSCOPY  last one 02-28-2009  . ESOPHAGOGASTRODUODENOSCOPY (EGD) WITH ESOPHAGEAL DILATION  x2  last one 04-28-2008  . EXAM UNDER ANESTHESIA EXTERNAL HEMORRHOIDECTOMY N/A 10/27/2015   [facility]formed [facility] Michael Boston[facility] at [facility]  . EXCISION BREAST BX  2015   CYST  . TONSILLECTOMY  as child  . TRANSANAL HEMORRHOIDAL DEARTERIALIZATION N/A 10/27/2015   Performed by Michael Boston, MD at [facility]   Occupational History  . Not on file  Tobacco Use  . Smoking status: Former Smoker    Packs/day: 1.00    Years: 37.00    Pack years: 37.00    Types: Cigarettes    Start date: 04/02/1973    Last attempt to quit: 12/19/2014    Years since quitting: 2.1  . Smokeless tobacco:  Never Used  . Tobacco comment: quit over a year.  Substance and Sexual Activity  . Alcohol use: No    Alcohol/week: 0.0 oz  . Drug use: No    Comment: recovery addict since 2007  . Sexual activity: Not on file

## 2017-02-25 ENCOUNTER — Other Ambulatory Visit: Payer: 59

## 2017-02-25 DIAGNOSIS — K7460 Unspecified cirrhosis of liver: Secondary | ICD-10-CM

## 2017-02-26 LAB — AFP TUMOR MARKER: AFP TUMOR MARKER: 6.5 ng/mL — AB

## 2017-03-05 ENCOUNTER — Other Ambulatory Visit: Payer: Self-pay

## 2017-03-07 ENCOUNTER — Encounter: Payer: Self-pay | Admitting: Internal Medicine

## 2017-08-02 ENCOUNTER — Telehealth: Payer: Self-pay

## 2017-08-02 ENCOUNTER — Other Ambulatory Visit: Payer: Self-pay

## 2017-08-02 DIAGNOSIS — K769 Liver disease, unspecified: Secondary | ICD-10-CM

## 2017-08-02 NOTE — Telephone Encounter (Signed)
-----   Message from Doristine Counter, RN sent at 02/12/2017 10:21 AM EST ----- Schedule 6 month follow up MRCP for pancreatic cyst/liver lesion. Due in May 2019

## 2017-08-02 NOTE — Telephone Encounter (Signed)
Left message for patient that it is time to schedule a 6 month follow up MR abdomen, lesion on the liver. Order placed for [employer], they will contact patient to schedule.

## 2017-08-05 ENCOUNTER — Encounter: Payer: Self-pay | Admitting: Internal Medicine

## 2017-08-23 ENCOUNTER — Ambulatory Visit
Admission: RE | Admit: 2017-08-23 | Discharge: 2017-08-23 | Disposition: A | Discharge status: Home / Self Care | Payer: 59 | Source: Ambulatory Visit | Attending: Gastroenterology | Admitting: Gastroenterology

## 2017-08-23 DIAGNOSIS — K769 Liver disease, unspecified: Secondary | ICD-10-CM

## 2017-08-23 MED ORDER — GADOBENATE DIMEGLUMINE 529 MG/ML IV SOLN
15.0000 mL | Freq: Once | INTRAVENOUS | Status: AC | PRN
  Administered 2017-08-23: 15 mL via INTRAVENOUS

## 2017-08-27 ENCOUNTER — Other Ambulatory Visit: Payer: Self-pay

## 2017-08-27 DIAGNOSIS — K769 Liver disease, unspecified: Secondary | ICD-10-CM

## 2017-08-28 ENCOUNTER — Other Ambulatory Visit: Payer: Self-pay

## 2017-08-28 IMAGING — CT CT ABDOMEN W/ CM
3 of 9 series · 14 of 46 positions shown, 16 images · IV contrast (iopamidol)
Comparison: No priors.

CLINICAL DATA: 55-year-old female with elevated liver function
tests. Recent diagnosis of hepatitis-C.

EXAM:
CT ABDOMEN WITH CONTRAST
TECHNIQUE: Multidetector CT imaging of the abdomen was performed using the
standard protocol following bolus administration of intravenous
contrast.
CONTRAST:  100mL [id number] IOPAMIDOL (RL34LF-5OO) INJECTION 61%

[Series 2: axial arterial · axial · arterial · 0.72mm/px · z∈[-209,-185]mm · 2 of 84 slices shown]
[im 9/84  soft-tissue]
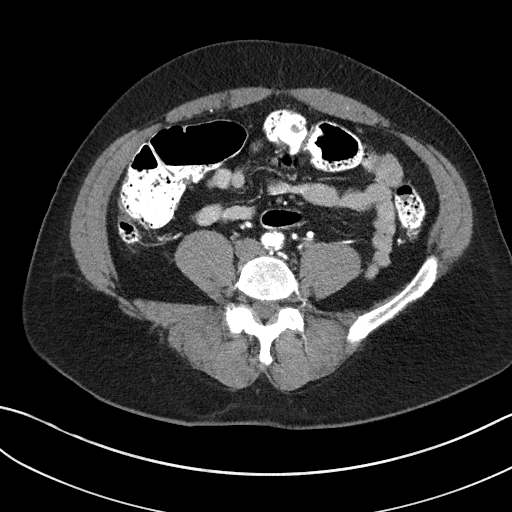
[im 17/84  soft-tissue]
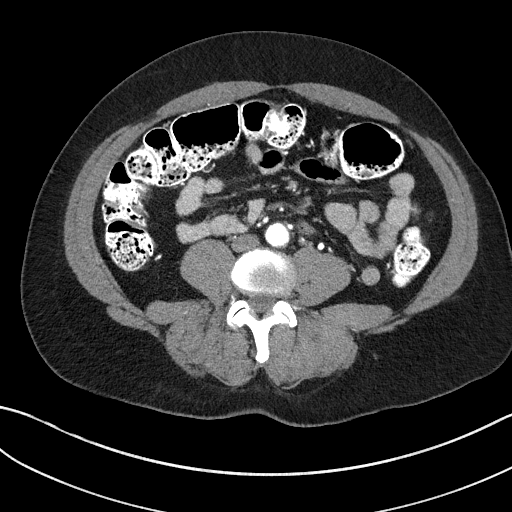

[Series 5: axial venous · axial · portal-venous · 0.69mm/px · z∈[-211,-13]mm · 9 of 84 slices shown, 11 images]
[im 9/84  soft-tissue]
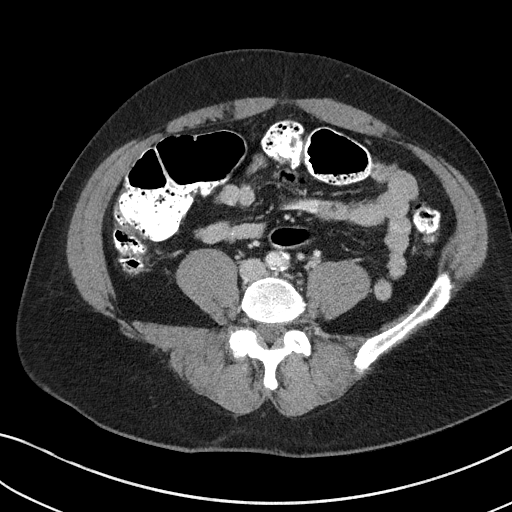
[im 9/84  bone]
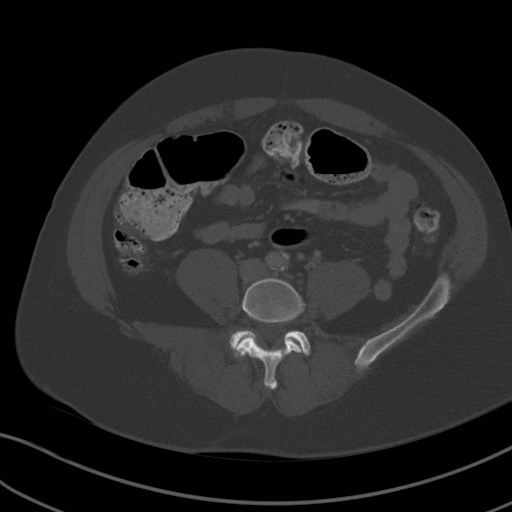
[im 17/84  soft-tissue]
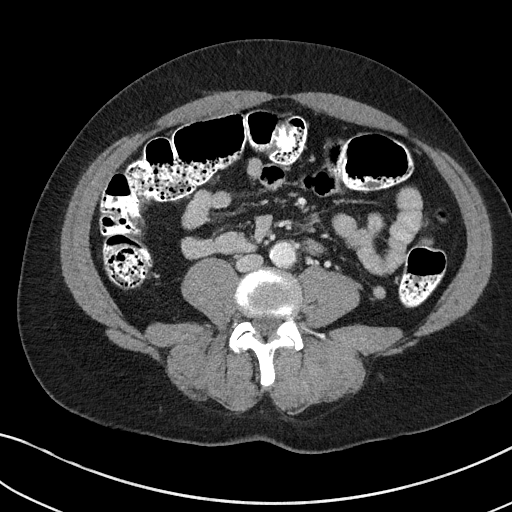
[im 25/84  soft-tissue]
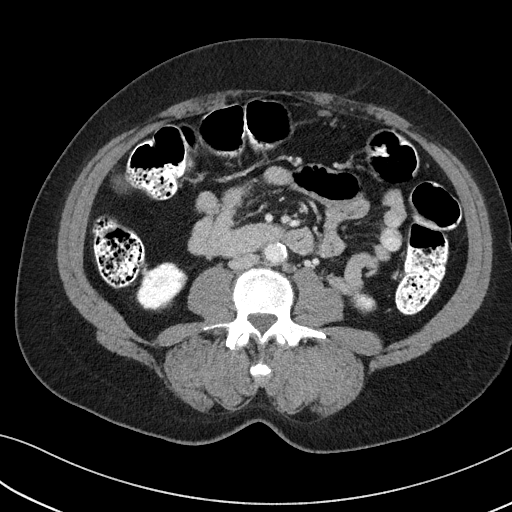
[im 34/84  soft-tissue]
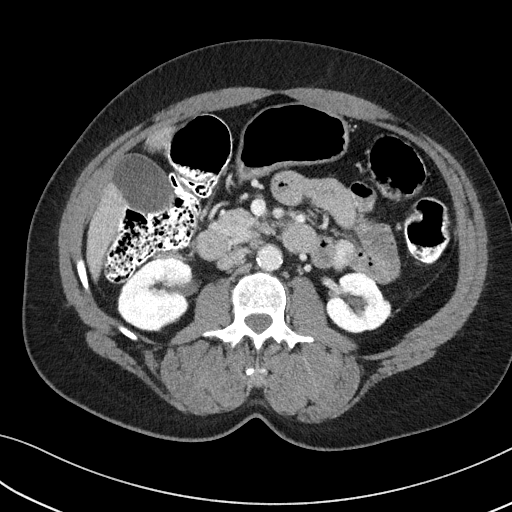
[im 42/84  soft-tissue]
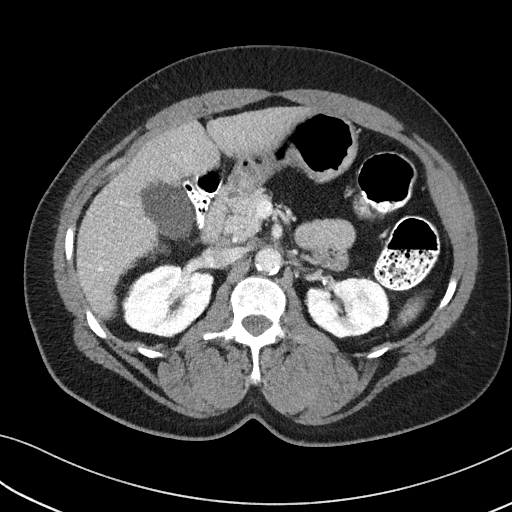
[im 50/84  soft-tissue]
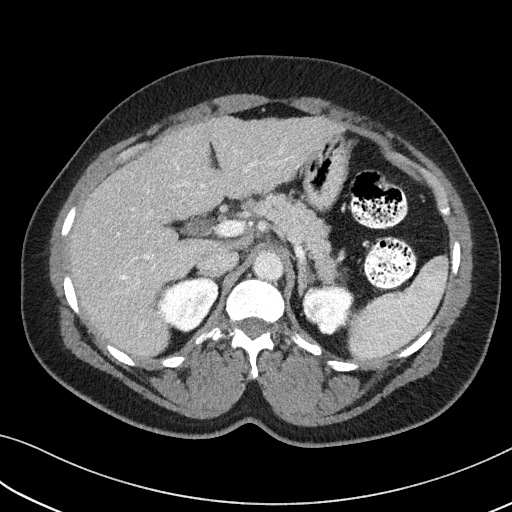
[im 59/84  soft-tissue]
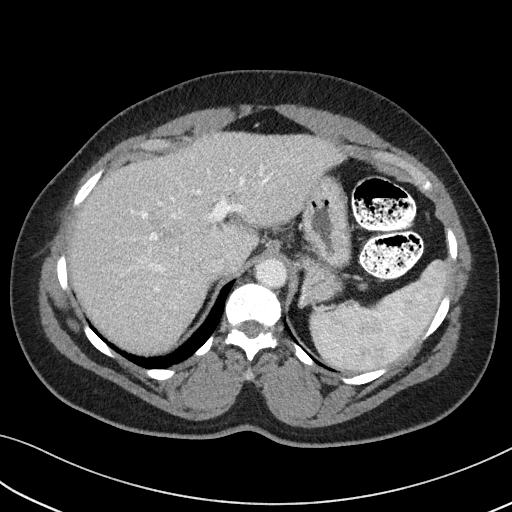
[im 67/84  soft-tissue]
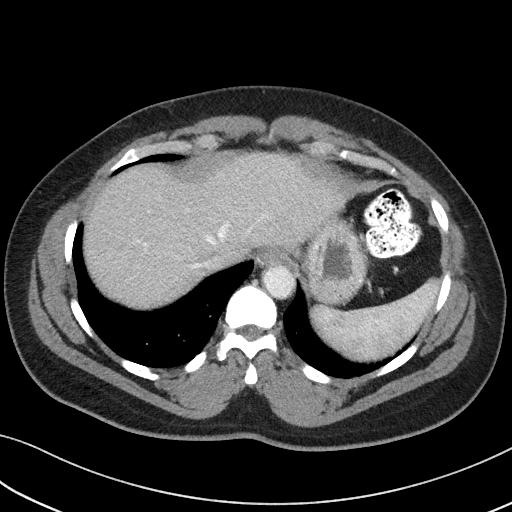
[im 75/84  soft-tissue]
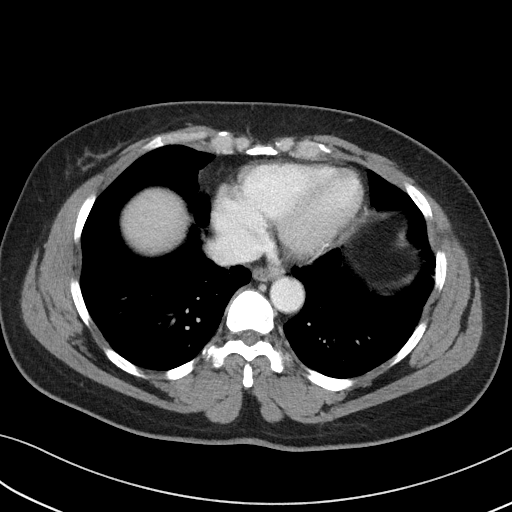
[im 75/84  bone]
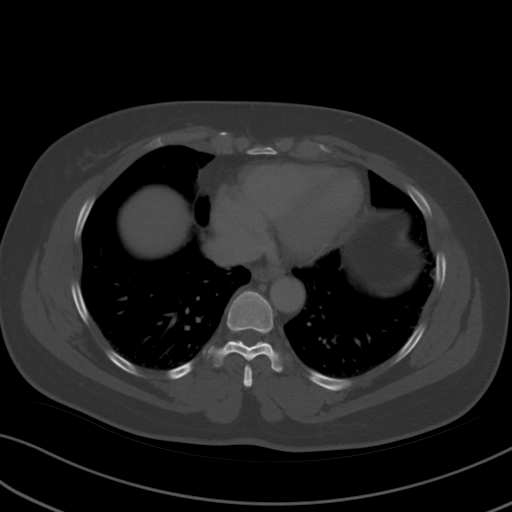

[Series 8: coronal arterial · coronal · arterial · 0.49mm/px · 3 of 95 slices shown]
[im 24/95  soft-tissue]
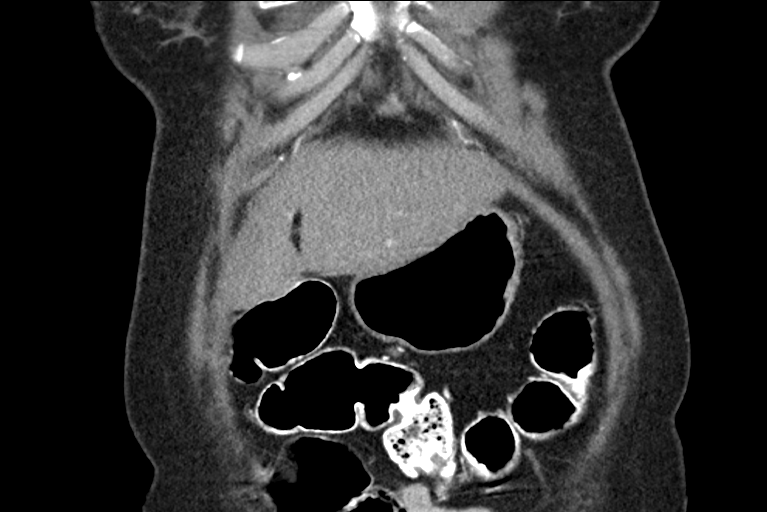
[im 48/95  soft-tissue]
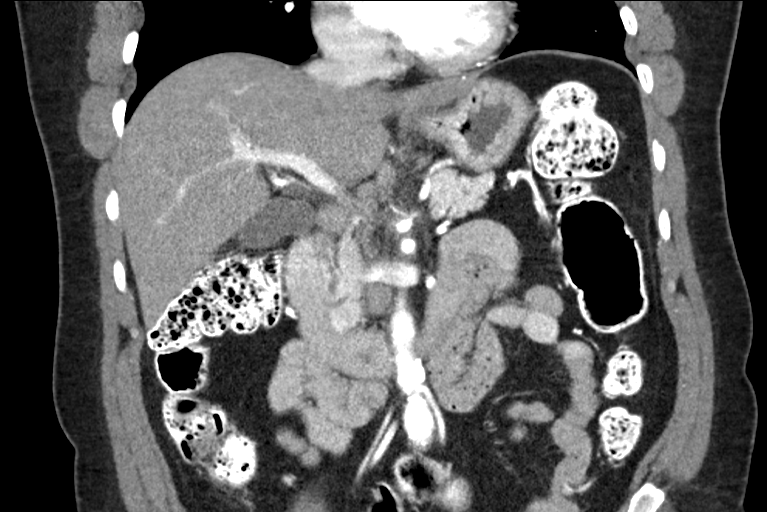
[im 71/95  soft-tissue]
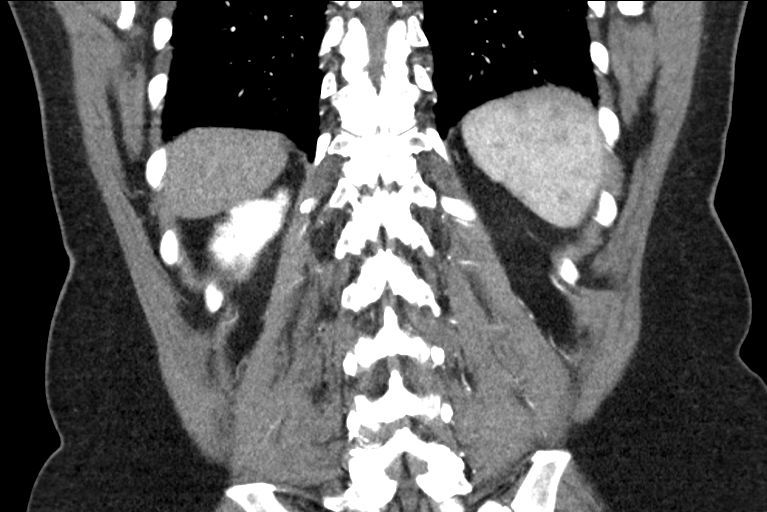

[14 of 46 positions shown; findings below may reference images not displayed]

FINDINGS: Lower chest: Mild scarring noted in the visualized lung bases.

Hepatobiliary: The liver has a slightly shrunken appearance and
nodular contour, suggestive of early changes of cirrhosis. No
discrete cystic or solid hepatic lesions are noted. Specifically, no
definite hypervascular lesions are identified to suggest underlying
hepatocellular carcinoma at this time. No intra or extrahepatic
biliary ductal dilatation. Gallbladder is normal in appearance.

Pancreas: In the inferior aspect of the uncinate process of the
pancreas there is a well-defined 1.5 x 1.0 x 1.3 cm hypovascular
lesion which is low-intermediate attenuation (22-27 HU during all
phases of the examination) which is incompletely characterized
(axial image 53 of series 5 and coronal image 36 of series 10) but
does not appear to enhance, favored to represent a small mildly
proteinaceous pancreatic pseudocyst. No other suspicious pancreatic
lesions are noted. No pancreatic ductal dilatation. No
peripancreatic inflammatory changes.

Spleen: Unremarkable.

Adrenals/Urinary Tract: Bilateral kidneys and bilateral adrenal
glands are normal in appearance. No hydroureteronephrosis in the
visualized abdomen.

Stomach/Bowel: The appearance of the stomach is normal. No
pathologic dilatation of small bowel or colon.

Vascular/Lymphatic: Extensive atherosclerosis in the abdominal
vasculature, including fusiform ectasia of the infrarenal abdominal
aorta which measures up to 2.1 x 2.4 cm. High-grade stenosis of the
left common iliac artery is also noted distally. Prominent but
nonenlarged right para-aortic lymph node measuring 8 mm in short
axis is nonspecific (image 49 of series 2). No lymphadenopathy is
noted in the abdomen.

Other: No significant volume of ascites in the visualized peritoneal
cavity and no pneumoperitoneum.

Musculoskeletal: There are no aggressive appearing lytic or blastic
lesions noted in the visualized portions of the skeleton.
IMPRESSION: 1. Subtle changes of cirrhosis in the liver, without hypervascular
lesion identified on today's examination to suggest underlying
hepatocellular carcinoma.
2. Indeterminate lesion in the uncinate process of the pancreas
measuring 1.5 x 1.0 x 1.3 cm, favored to represent a mildly
proteinaceous pancreatic pseudocyst. Recommend follow up pre and
post contrast MRI/MRCP or pancreatic protocol CT in 1 year. This
recommendation follows ACR consensus guidelines: Management of
Incidental Pancreatic Cysts: A White Paper of the ACR Incidental
Findings Committee. [HOSPITAL] 4271;[DATE].
3. Aortic atherosclerosis. There is also high-grade stenosis of the
distal left common iliac artery.

## 2017-08-29 ENCOUNTER — Other Ambulatory Visit (INDEPENDENT_AMBULATORY_CARE_PROVIDER_SITE_OTHER): Payer: 59

## 2017-08-29 DIAGNOSIS — K7460 Unspecified cirrhosis of liver: Secondary | ICD-10-CM | POA: Diagnosis not present

## 2017-08-29 DIAGNOSIS — K769 Liver disease, unspecified: Secondary | ICD-10-CM

## 2017-08-29 LAB — COMPREHENSIVE METABOLIC PANEL
ALT: 16 U/L (ref 0–35)
AST: 24 U/L (ref 0–37)
Albumin: 4.2 g/dL (ref 3.5–5.2)
Alkaline Phosphatase: 80 U/L (ref 39–117)
BILIRUBIN TOTAL: 0.7 mg/dL (ref 0.2–1.2)
BUN: 14 mg/dL (ref 6–23)
CALCIUM: 9.9 mg/dL (ref 8.4–10.5)
CHLORIDE: 105 meq/L (ref 96–112)
CO2: 27 meq/L (ref 19–32)
Creatinine, Ser: 1.03 mg/dL (ref 0.40–1.20)
GFR: 70.58 mL/min (ref >60.00)
GLUCOSE: 111 mg/dL — AB (ref 70–99)
Potassium: 4.1 mEq/L (ref 3.5–5.1)
SODIUM: 138 meq/L (ref 135–145)
Total Protein: 8 g/dL (ref 6.0–8.3)

## 2017-08-29 LAB — CBC WITH DIFFERENTIAL/PLATELET
Basophils Absolute: 0 10*3/uL (ref 0.0–0.1)
Basophils Relative: 0.3 % (ref 0.0–3.0)
EOS ABS: 0.1 10*3/uL (ref 0.0–0.7)
Eosinophils Relative: 2.3 % (ref 0.0–5.0)
HEMATOCRIT: 44.5 % (ref 36.0–46.0)
Hemoglobin: 15.2 g/dL — ABNORMAL HIGH (ref 12.0–15.0)
LYMPHS PCT: 52.5 % — AB (ref 12.0–46.0)
Lymphs Abs: 2.3 10*3/uL (ref 0.7–4.0)
MCHC: 34.2 g/dL (ref 30.0–36.0)
MCV: 89.8 fl (ref 78.0–100.0)
MONO ABS: 0.4 10*3/uL (ref 0.1–1.0)
Monocytes Relative: 9.7 % (ref 3.0–12.0)
NEUTROS ABS: 1.5 10*3/uL (ref 1.4–7.7)
Neutrophils Relative %: 35.2 % — ABNORMAL LOW (ref 43.0–77.0)
PLATELETS: 198 10*3/uL (ref 150.0–400.0)
RBC: 4.96 Mil/uL (ref 3.87–5.11)
RDW: 13.1 % (ref 11.5–15.5)
WBC: 4.3 10*3/uL (ref 4.0–10.5)

## 2017-08-29 LAB — PROTIME-INR
INR: 1.1 ratio — ABNORMAL HIGH (ref 0.8–1.0)
Prothrombin Time: 12.8 s (ref 9.6–13.1)

## 2017-08-30 ENCOUNTER — Encounter: Payer: Self-pay | Admitting: Gastroenterology

## 2017-08-30 LAB — AFP TUMOR MARKER: AFP-Tumor Marker: 7.1 ng/mL — ABNORMAL HIGH

## 2017-09-02 ENCOUNTER — Telehealth: Payer: Self-pay | Admitting: Gastroenterology

## 2017-09-02 NOTE — Telephone Encounter (Signed)
Please see recent result note for labs. I think dottie tried to call her to relay lab results as below:  Can you let her know? thanks   Notes recorded by Larina Bras, CMA on 08/30/2017 at 4:02 PM EDT Left voicemail for patient to call back. ------  Notes recorded by Yetta Flock, MD on 08/30/2017 at 11:41 AM EDT Jan can you let this patient know labs are relatively stable. AFP slightly increased from previous but recent MRI of the liver did not show any concerning changes - stable liver lesion. I would like to see her back in the clinic within the next 6 months. Will plan on labs and repeating imaging of the liver again in 6 months as well. Thanks

## 2017-09-02 NOTE — Telephone Encounter (Signed)
Did you call patient? Thanks.

## 2017-09-03 NOTE — Telephone Encounter (Signed)
Left message to call back  

## 2017-09-03 NOTE — Telephone Encounter (Signed)
Patient advised of results and recall placed to schedule 6 month follow up visit.

## 2017-09-13 ENCOUNTER — Encounter (INDEPENDENT_AMBULATORY_CARE_PROVIDER_SITE_OTHER): Payer: Self-pay | Admitting: Orthopaedic Surgery

## 2017-09-13 ENCOUNTER — Ambulatory Visit (INDEPENDENT_AMBULATORY_CARE_PROVIDER_SITE_OTHER): Payer: 59

## 2017-09-13 ENCOUNTER — Ambulatory Visit (INDEPENDENT_AMBULATORY_CARE_PROVIDER_SITE_OTHER): Payer: 59 | Admitting: Orthopaedic Surgery

## 2017-09-13 DIAGNOSIS — M1711 Unilateral primary osteoarthritis, right knee: Secondary | ICD-10-CM

## 2017-09-13 MED ORDER — LIDOCAINE HCL 1 % IJ SOLN
2.0000 mL | INTRAMUSCULAR | Status: AC | PRN
  Administered 2017-09-13: 2 mL

## 2017-09-13 MED ORDER — METHYLPREDNISOLONE ACETATE 40 MG/ML IJ SUSP
40.0000 mg | INTRAMUSCULAR | Status: AC | PRN
  Administered 2017-09-13: 40 mg via INTRA_ARTICULAR

## 2017-09-13 MED ORDER — BUPIVACAINE HCL 0.25 % IJ SOLN
2.0000 mL | INTRAMUSCULAR | Status: AC | PRN
Start: 2017-09-13 — End: 2017-09-13
  Administered 2017-09-13: 2 mL via INTRA_ARTICULAR

## 2017-09-13 NOTE — Progress Notes (Signed)
Office Visit Note   Patient: Stacy Thomas           Date of Birth: [birth year]           MRN: [id number] Visit Date: 09/13/2017              Requested by: Thomasene Ripple, MD [address] [place], [place] [address] PCP: Thomasene Ripple, MD   Assessment & Plan: Visit Diagnoses:  1. Unilateral primary osteoarthritis, right knee     Plan: Impression is right knee primary localized osteoarthritis.  Today, we proceeded with an intra-articular cortisone injection.  I am hopeful that this will help settle things down at least for a short period of time.  She does know the definitive treatment will be a right total knee replacement.  She will follow-up as needed.  Call with concerns or questions in the meantime.  Follow-Up Instructions: Return if symptoms worsen or fail to improve.   Orders:  Orders Placed This Encounter  Procedures  . Large Joint Inj: R knee  . XR KNEE 3 VIEW RIGHT   No orders of the defined types were placed in this encounter.     Procedures: Large Joint Inj: R knee on 09/13/2017 10:27 AM Indications: pain Details: 22 G needle, anterolateral approach Medications: 2 mL lidocaine 1 %; 2 mL bupivacaine 0.25 %; 40 mg methylPREDNISolone acetate 40 MG/ML      Clinical Data: No additional findings.   Subjective: Chief Complaint  Patient presents with  . Right Knee - Pain    HPI patient is a pleasant 57 year old female who presents to our clinic today with recurrent right knee pain.  History of right knee osteoarthritis who has been followed by Dr. Sharol Given for this.  She is had cortisone injections in the past with moderate relief of symptoms.  Her last injection was in November 2018 which helped until a few months back.  The pain she has is primarily to the medial aspect.  Worse with ambulation.  She does have rest and night pain as well.  She is status post left AKA.  Review of Systems as detailed in HPI.  All others reviewed and are  negative.   Objective: Vital Signs: LMP 01/22/2011   Physical Exam well-developed and well-nourished female in no acute distress.  Alert and oriented x3.  Ortho Exam examination of the right knee shows a trace effusion.  Range of motion 5 to 95 degrees.  Medial joint line tenderness.  Moderate patellofemoral crepitus.  Specialty Comments:  No specialty comments available.  Imaging: Xr Knee 3 View Right  Result Date: 09/13/2017 X-rays of the right knee show bone-on-bone medial and patellofemoral compartments with multiple loose bodies throughout    PMFS History: Patient Active Problem List   Diagnosis Date Noted  . Hx of AKA (above knee amputation), left ([place]) 02/18/2017  . Unilateral primary osteoarthritis, right knee 02/18/2017  . Aphthous ulcer of tongue 10/29/2016  . Vitamin D deficiency 05/04/2016  . Bilateral primary osteoarthritis of hip 04/26/2016  . Bilateral carpal tunnel syndrome 03/29/2016  . Paresthesia of skin 03/23/2016  . Atrophic vaginitis 03/06/2016  . Screening for cervical cancer 03/06/2016  . PVD (peripheral vascular disease) ([place]) 03/05/2016  . Pancreatic abnormality 03/05/2016  . HTN (hypertension) 03/05/2016  . Hepatic cirrhosis ([place]) 11/15/2015  . External hemorrhoids with pain & bleeding 10/27/2015  . Chronic hepatitis C ([place]) 06/15/2015  . Transaminitis 06/14/2015  . Prolapsed internal hemorrhoids, grade 4 06/14/2015  . Peripheral neuropathy 06/14/2015  .  Unilateral AKA ([place])- left 06/14/2015  . Hx of osteosarcoma 06/14/2015  . DYSPHAGIA 06/13/2007  . ABDOMINAL PAIN-LUQ 06/13/2007   Past Medical History:  Diagnosis Date  . Bilateral arm pain    chronic  . Chronic hepatitis C ([place]) followed by dr comer at infectious disease   Genotype 1a--  currently on Harvoni started 09-13-2015  . Dependence on crutches    left leg prosthesis  . Drug addiction in remission ([place])    since 2007  (crack cocaine,  IV drug use)  . External hemorrhoid,  bleeding   . Hand weakness   . History of esophageal stricture    post dilation x2  . History of osteosarcoma    1982  left knee---  s/p  AKA LLE--   per pt no recurrence  . History of recreational drug use multiple rehab visits   IV drug use for 1 yr and Smoked crack for 30+yrs--  per pt clean since 2007  . Hyperlipidemia   . Hypertension   . Idiopathic peripheral neuropathy   . Osteoarthritis   . Prolapsed internal hemorrhoids, grade 4   . Sigmoid diverticulosis    mild    Family History  Problem Relation Age of Onset  . COPD Father   . Alcohol abuse Father        Deceased  . Hypertension Mother        Living  . Healthy Daughter   . Healthy Son   . Anesthesia problems Neg Hx   . Breast cancer Neg Hx     Past Surgical History:  Procedure Laterality Date  . ABOVE KNEE LEG AMPUTATION Left 1982   osteosarcoma  . COLONOSCOPY  last one 02-28-2009  . ESOPHAGOGASTRODUODENOSCOPY (EGD) WITH ESOPHAGEAL DILATION  x2  last one 04-28-2008  . EXCISION BREAST BX  2015   CYST  . TONSILLECTOMY  as child  . TRANSANAL HEMORRHOIDAL DEARTERIALIZATION N/A 10/27/2015   Procedure: TRANSANAL HEMORRHOIDAL DEARTERIALIZATION;  Surgeon: Michael Boston, MD;  Location: [facility];  Service: General;  Laterality: N/A;   Social History   Occupational History  . Not on file  Tobacco Use  . Smoking status: Former Smoker    Packs/day: 1.00    Years: 37.00    Pack years: 37.00    Types: Cigarettes    Start date: 04/02/1973    Last attempt to quit: 12/19/2014    Years since quitting: 2.7  . Smokeless tobacco: Never Used  . Tobacco comment: quit over a year.  Substance and Sexual Activity  . Alcohol use: No    Alcohol/week: 0.0 oz  . Drug use: No    Types: "Crack" cocaine    Comment: recovery addict since 2007  . Sexual activity: Not on file

## 2017-09-14 ENCOUNTER — Other Ambulatory Visit: Payer: Self-pay | Admitting: Internal Medicine

## 2017-09-18 ENCOUNTER — Encounter: Payer: Self-pay | Admitting: *Deleted

## 2017-10-11 LAB — GLUCOSE, POCT (MANUAL RESULT ENTRY): POC GLUCOSE: 113 mg/dL — AB (ref 70–99)

## 2017-11-28 ENCOUNTER — Other Ambulatory Visit: Payer: Self-pay | Admitting: Internal Medicine

## 2017-11-28 DIAGNOSIS — I10 Essential (primary) hypertension: Secondary | ICD-10-CM

## 2017-11-29 ENCOUNTER — Encounter: Payer: Self-pay | Admitting: Internal Medicine

## 2017-12-03 ENCOUNTER — Ambulatory Visit (INDEPENDENT_AMBULATORY_CARE_PROVIDER_SITE_OTHER): Payer: 59 | Admitting: Internal Medicine

## 2017-12-03 ENCOUNTER — Other Ambulatory Visit: Payer: Self-pay | Admitting: Internal Medicine

## 2017-12-03 VITALS — BP 117/80 | HR 85 | Temp 98.9°F | Wt 165.1 lb

## 2017-12-03 DIAGNOSIS — J309 Allergic rhinitis, unspecified: Secondary | ICD-10-CM | POA: Insufficient documentation

## 2017-12-03 DIAGNOSIS — Z85830 Personal history of malignant neoplasm of bone: Secondary | ICD-10-CM

## 2017-12-03 DIAGNOSIS — Z89612 Acquired absence of left leg above knee: Secondary | ICD-10-CM

## 2017-12-03 DIAGNOSIS — M1990 Unspecified osteoarthritis, unspecified site: Secondary | ICD-10-CM

## 2017-12-03 DIAGNOSIS — Z791 Long term (current) use of non-steroidal anti-inflammatories (NSAID): Secondary | ICD-10-CM

## 2017-12-03 DIAGNOSIS — M1611 Unilateral primary osteoarthritis, right hip: Secondary | ICD-10-CM | POA: Insufficient documentation

## 2017-12-03 DIAGNOSIS — Z23 Encounter for immunization: Secondary | ICD-10-CM

## 2017-12-03 DIAGNOSIS — I10 Essential (primary) hypertension: Secondary | ICD-10-CM

## 2017-12-03 DIAGNOSIS — Z79899 Other long term (current) drug therapy: Secondary | ICD-10-CM

## 2017-12-03 DIAGNOSIS — Z8619 Personal history of other infectious and parasitic diseases: Secondary | ICD-10-CM

## 2017-12-03 DIAGNOSIS — K7460 Unspecified cirrhosis of liver: Secondary | ICD-10-CM

## 2017-12-03 DIAGNOSIS — K219 Gastro-esophageal reflux disease without esophagitis: Secondary | ICD-10-CM | POA: Diagnosis not present

## 2017-12-03 DIAGNOSIS — H73893 Other specified disorders of tympanic membrane, bilateral: Secondary | ICD-10-CM

## 2017-12-03 DIAGNOSIS — I739 Peripheral vascular disease, unspecified: Secondary | ICD-10-CM

## 2017-12-03 MED ORDER — TRIAMTERENE-HCTZ 37.5-25 MG PO TABS: 1.0000 | ORAL_TABLET | Freq: Every day | ORAL | 3 refills | Status: DC

## 2017-12-03 MED ORDER — DULOXETINE HCL 30 MG PO CPEP: 30.0000 mg | ORAL_CAPSULE | Freq: Every day | ORAL | 2 refills | Status: DC

## 2017-12-03 MED ORDER — FLUTICASONE PROPIONATE 50 MCG/ACT NA SUSP: 1.0000 | Freq: Every day | NASAL | 2 refills | Status: DC

## 2017-12-03 NOTE — Patient Instructions (Addendum)
For your hip pain, please contact your orthopedic doctor to set up an appointment for another joint injection.  For your cough, start using flonase nasal spray

## 2017-12-03 NOTE — Telephone Encounter (Signed)
Pt has scheduled an appt today in [place].

## 2017-12-03 NOTE — Telephone Encounter (Signed)
Needs refill on triamterene-hydrochlorothiazide (MAXZIDE-25) 37.5-25 MG tablet pravastatin (PRAVACHOL) 40 MG tablet @ [facility]; pt contact [phone]

## 2017-12-03 NOTE — Assessment & Plan Note (Signed)
BP well controlled today.  Plan: --refill triamterene-hctz 37.5-25mg  daily

## 2017-12-03 NOTE — Assessment & Plan Note (Signed)
Patient with recurrent right hip pain which in the past was relieved with steroid injection; she follows with orthopedics for this.   Plan: --asked patient to set up appt with ortho for possible injection --will start duloxetine 30mg  daily as it has been shown to help in controlled OA pain; she does have compensated cirrhosis with preserved productive function so dosing with duloxetine will have to be monitored closely as hepatically metobolized and should likely not be escalated in the future

## 2017-12-03 NOTE — Progress Notes (Signed)
   CC: right hip pain  HPI:  [name] is a 57 y.o. with a PMH of PAD, HCV in SVR, cirrhosis, h/o osteocarcinoma s/p L AKA, osteoarthritis, and HTN presenting to clinic for follow up on HTN and right hip pain.  HTN: Patient states she ran out of triamterene-hctz about a week ago; she does not check her BP at home, however feels that it has been uncontrolled. She denies chest pain, shortness of breath, headaches.   Right hip pain: Patient with h/o of OA in right hip for which she receives intermittent steroid injections by Ortho; she feels that it is about time for another injection. She intermittently takes ibuprofen for pain (~twice per week) which does provide some relief; she endorses worse pain with any movement. She has been recommended to get hip replacement, however is unable to afford at this time.   Allergic sinusitis:  Patient endorses a couple month history of minimally productive cough with clear sputum; she endorses associated sinus congestion and some ear fullness. She denies fevers, chills, shortness of breath.   Please see problem based Assessment and Plan for status of patients chronic conditions.  Past Medical History:  Diagnosis Date  . Bilateral arm pain    chronic  . Chronic hepatitis C ([place]) followed by dr comer at infectious disease   Genotype 1a--  currently on Harvoni started 09-13-2015  . Dependence on crutches    left leg prosthesis  . Drug addiction in remission ([place])    since 2007  (crack cocaine,  IV drug use)  . External hemorrhoid, bleeding   . Hand weakness   . History of esophageal stricture    post dilation x2  . History of osteosarcoma    1982  left knee---  s/p  AKA LLE--   per pt no recurrence  . History of recreational drug use multiple rehab visits   IV drug use for 1 yr and Smoked crack for 30+yrs--  per pt clean since 2007  . Hyperlipidemia   . Hypertension   . Idiopathic peripheral neuropathy   . Osteoarthritis   . Prolapsed  internal hemorrhoids, grade 4   . Sigmoid diverticulosis    mild    Review of Systems:   Per HPI.  Physical Exam:  Vitals:   12/03/17 1442  BP: 117/80  Pulse: 85  Temp: 98.9 F (37.2 C)  TempSrc: Oral  SpO2: 98%  Weight: 165 lb 1.6 oz (74.9 kg)   GENERAL- alert, co-operative, appears as stated age, not in any distress. HEENT- oral mucosa appears moist, some cobblestoning of pharynx; scarring of bil TM w/o effusion; nasal turbinate edema.Marland Kitchen CARDIAC- RRR, no murmurs, rubs or gallops. RESP- Moving equal volumes of air, and clear to auscultation bilaterally, no wheezes or crackles. ABDOMEN- Soft, nontender, bowel sounds present. NEURO- CN 2-12 grossly intact. EXTREMITIES- s/p L AKA; pulse 2+ PT/DP on right with minimal dependent pedal edema; no calf tenderness/warmth/swelling noted; no knee effusion noted or point tenderness; no spinal or paraspinal tenderness SKIN- Warm, dry. PSYCH- Normal mood and affect, appropriate thought content and speech.  Assessment & Plan:   See Encounters Tab for problem based charting.   Patient discussed with Dr. Abelardo Diesel, MD Internal Medicine PGY-3

## 2017-12-03 NOTE — Assessment & Plan Note (Signed)
Patient endorses cough likely related to allergic sinusitis/rhinitis based on history. No s/sx of infection at this time. She also states her GERD is currently well controlled so likely not implicated in current cough. Lung exam is w/o rales or wheezing.  Plan: --flonase daily --f/u if not improving in a month

## 2017-12-04 NOTE — Progress Notes (Signed)
Internal Medicine Clinic Attending  Case discussed with Dr. Svalina  at the time of the visit.  We reviewed the resident's history and exam and pertinent patient test results.  I agree with the assessment, diagnosis, and plan of care documented in the resident's note.  

## 2017-12-05 NOTE — Telephone Encounter (Signed)
Pt was seen on 12/03/2017 by Dr Jari Favre in [place].  PT was asked to return in 3-4 months with her PCP.  No appointment ava at this time.

## 2018-02-11 ENCOUNTER — Telehealth: Payer: Self-pay

## 2018-02-11 ENCOUNTER — Other Ambulatory Visit: Payer: Self-pay

## 2018-02-11 DIAGNOSIS — K7460 Unspecified cirrhosis of liver: Secondary | ICD-10-CM

## 2018-02-11 NOTE — Telephone Encounter (Signed)
-----   Message from Doristine Counter, RN sent at 08/28/2017 12:44 PM EDT ----- Repeat US in 6 months due end of Nov

## 2018-02-11 NOTE — Telephone Encounter (Signed)
Left vm for patient about her 6 month ultrasound, it is scheduled for 11/19 at [facility]. I have also sent her a MyChart message with information.

## 2018-02-18 ENCOUNTER — Ambulatory Visit (HOSPITAL_COMMUNITY): Payer: 59

## 2018-02-25 ENCOUNTER — Ambulatory Visit (HOSPITAL_COMMUNITY)
Admission: RE | Admit: 2018-02-25 | Discharge: 2018-02-25 | Disposition: A | Discharge status: Home / Self Care | Payer: 59 | Source: Ambulatory Visit | Attending: Gastroenterology | Admitting: Gastroenterology

## 2018-02-25 DIAGNOSIS — K7460 Unspecified cirrhosis of liver: Secondary | ICD-10-CM | POA: Diagnosis present

## 2018-03-03 ENCOUNTER — Telehealth: Payer: Self-pay | Admitting: *Deleted

## 2018-03-03 NOTE — Telephone Encounter (Signed)
Patient called in stating she is having chest pain in the center of her chest that goes through to her back. Pain began last evening but is worse this AM; rates 8/10 at present. Also states walking from the bathroom to kitchen has made her [facility]. Patient instructed to hang up and call 911. Patient was hoping to come to clinic instead. Explained that we can not monitor patients in clinic with active chest pain. Again advised to call 911. Patient states she will. Hubbard Hartshorn, RN, BSN

## 2018-03-03 NOTE — Telephone Encounter (Signed)
I agree. She needs to be evaluated in the ED

## 2018-04-24 ENCOUNTER — Other Ambulatory Visit: Payer: Self-pay | Admitting: Internal Medicine

## 2018-05-05 ENCOUNTER — Encounter: Payer: Self-pay | Admitting: *Deleted

## 2018-05-05 ENCOUNTER — Telehealth: Payer: Self-pay | Admitting: *Deleted

## 2018-05-05 ENCOUNTER — Encounter: Payer: Self-pay | Admitting: Internal Medicine

## 2018-05-06 ENCOUNTER — Encounter: Payer: Self-pay | Admitting: Gastroenterology

## 2018-05-06 ENCOUNTER — Other Ambulatory Visit: Payer: Self-pay | Admitting: Internal Medicine

## 2018-05-06 NOTE — Telephone Encounter (Signed)
Needs refill on pravastatin (PRAVACHOL) 40 MG tablet at [place], [place] - [address]; pt went to pharmacy, pharmacy told pt office needs to call ; pt contact [phone]

## 2018-05-06 NOTE — Telephone Encounter (Signed)
Called [facility], did not receive script 1/24, gave VO they will notify pt when ready

## 2018-06-30 ENCOUNTER — Telehealth (INDEPENDENT_AMBULATORY_CARE_PROVIDER_SITE_OTHER): Payer: Self-pay | Admitting: *Deleted

## 2018-06-30 NOTE — Telephone Encounter (Signed)
Called pt and they answered no to all covid-19 pre screening questions.  

## 2018-07-01 ENCOUNTER — Encounter (INDEPENDENT_AMBULATORY_CARE_PROVIDER_SITE_OTHER): Payer: Self-pay | Admitting: Orthopedic Surgery

## 2018-07-01 ENCOUNTER — Ambulatory Visit (INDEPENDENT_AMBULATORY_CARE_PROVIDER_SITE_OTHER): Payer: 59

## 2018-07-01 ENCOUNTER — Other Ambulatory Visit: Payer: Self-pay

## 2018-07-01 ENCOUNTER — Ambulatory Visit (INDEPENDENT_AMBULATORY_CARE_PROVIDER_SITE_OTHER): Payer: 59 | Admitting: Orthopedic Surgery

## 2018-07-01 VITALS — Ht 61.0 in | Wt 165.0 lb

## 2018-07-01 DIAGNOSIS — M25571 Pain in right ankle and joints of right foot: Secondary | ICD-10-CM

## 2018-07-01 DIAGNOSIS — M25871 Other specified joint disorders, right ankle and foot: Secondary | ICD-10-CM | POA: Diagnosis not present

## 2018-07-01 DIAGNOSIS — M1711 Unilateral primary osteoarthritis, right knee: Secondary | ICD-10-CM

## 2018-07-01 MED ORDER — METHYLPREDNISOLONE ACETATE 40 MG/ML IJ SUSP
40.0000 mg | INTRAMUSCULAR | Status: AC | PRN
  Administered 2018-07-01: 40 mg via INTRA_ARTICULAR

## 2018-07-01 MED ORDER — LIDOCAINE HCL 1 % IJ SOLN
2.0000 mL | INTRAMUSCULAR | Status: AC | PRN
  Administered 2018-07-01: 2 mL

## 2018-07-01 MED ORDER — LIDOCAINE HCL 1 % IJ SOLN
5.0000 mL | INTRAMUSCULAR | Status: AC | PRN
  Administered 2018-07-01: 5 mL

## 2018-07-01 NOTE — Progress Notes (Signed)
Office Visit Note   Patient: Stacy Thomas           Date of Birth: 12-16-1960           MRN: [id number] Visit Date: 07/01/2018              Requested by: Asencion Noble, MD [address], [place] [address] PCP: Asencion Noble, MD  Chief Complaint  Patient presents with  . Right Ankle - Pain      HPI: Patient is a 58 year old woman who presents with impingement symptoms medial joint line right ankle she states it is tender to touch over the medial joint line she states her ankle is giving out and she has fallen several times she states this began in September.  Patient also complains of chronic osteoarthritis of her right knee.  Patient also has to use crutches for her prosthesis on the left and states that she has developed some wrist pain dorsally and is currently wearing a Velcro splint.  Assessment & Plan: Visit Diagnoses:  1. Pain in right ankle and joints of right foot   2. Impingement of right ankle joint   3. Unilateral primary osteoarthritis, right knee     Plan: Plan: Patient will follow-up as needed if she requires additional injection for the right knee or right ankle.  She will continue use the Velcro splint for the right wrist pain.  Follow-Up Instructions: Return if symptoms worsen or fail to improve.   Ortho Exam  Patient is alert, oriented, no adenopathy, well-dressed, normal affect, normal respiratory effort. Examination patient has tenderness to palpation of the medial joint line of the right knee there is no effusion no redness no cellulitis no signs of infection.  Examination the right ankle the peroneal and posterior tibial tendons are nontender to palpation.  She is point tender to palpation over the medial joint line of the right ankle she has good dorsiflexion of the ankle with no Achilles contracture.  Imaging: Xr Ankle Complete Right  Result Date: 07/01/2018 2 view radiographs of the right ankle shows a congruent joint space no  subcondylar cysts or sclerosis no fractures.  No images are attached to the encounter.  Labs: Lab Results  Component Value Date   HGBA1C 5.4 03/05/2016     Lab Results  Component Value Date   ALBUMIN 4.2 08/29/2017   ALBUMIN 4.2 06/19/2016   ALBUMIN 4.2 01/31/2016    Body mass index is 31.18 kg/m.  Orders:  Orders Placed This Encounter  Procedures  . XR Ankle Complete Right   No orders of the defined types were placed in this encounter.    Procedures: Large Joint Inj: R knee on 07/01/2018 9:45 AM Indications: pain and diagnostic evaluation Details: 22 G 1.5 in needle, anteromedial approach  Arthrogram: No  Medications: 5 mL lidocaine 1 %; 40 mg methylPREDNISolone acetate 40 MG/ML Outcome: tolerated well, no immediate complications Procedure, treatment alternatives, risks and benefits explained, specific risks discussed. Consent was given by the patient. Immediately prior to procedure a time out was called to verify the correct patient, procedure, equipment, support staff and site/side marked as required. Patient was prepped and draped in the usual sterile fashion.   Medium Joint Inj: R ankle on 07/01/2018 9:46 AM Indications: pain and diagnostic evaluation Details: 22 G 1.5 in needle, anteromedial approach Medications: 2 mL lidocaine 1 %; 40 mg methylPREDNISolone acetate 40 MG/ML Outcome: tolerated well, no immediate complications Procedure, treatment alternatives, risks and benefits  explained, specific risks discussed. Consent was given by the patient. Immediately prior to procedure a time out was called to verify the correct patient, procedure, equipment, support staff and site/side marked as required. Patient was prepped and draped in the usual sterile fashion.      Clinical Data: No additional findings.  ROS:  All other systems negative, except as noted in the HPI. Review of Systems  Objective: Vital Signs: Ht 5\' 1"  (1.549 m)   Wt 165 lb (74.8 kg)   LMP  01/22/2011   BMI 31.18 kg/m   Specialty Comments:  No specialty comments available.  PMFS History: Patient Active Problem List   Diagnosis Date Noted  . Allergic sinusitis 12/03/2017  . Osteoarthritis of right hip 12/03/2017  . Hx of AKA (above knee amputation), left ([place]) 02/18/2017  . Unilateral primary osteoarthritis, right knee 02/18/2017  . Aphthous ulcer of tongue 10/29/2016  . Vitamin D deficiency 05/04/2016  . Bilateral primary osteoarthritis of hip 04/26/2016  . Bilateral carpal tunnel syndrome 03/29/2016  . Paresthesia of skin 03/23/2016  . Atrophic vaginitis 03/06/2016  . Screening for cervical cancer 03/06/2016  . PVD (peripheral vascular disease) ([place]) 03/05/2016  . Pancreatic abnormality 03/05/2016  . HTN (hypertension) 03/05/2016  . Hepatic cirrhosis ([place]) 11/15/2015  . External hemorrhoids with pain & bleeding 10/27/2015  . Chronic hepatitis C ([place]) 06/15/2015  . Transaminitis 06/14/2015  . Prolapsed internal hemorrhoids, grade 4 06/14/2015  . Peripheral neuropathy 06/14/2015  . Unilateral AKA ([place])- left 06/14/2015  . Hx of osteosarcoma 06/14/2015  . DYSPHAGIA 06/13/2007  . ABDOMINAL PAIN-LUQ 06/13/2007   Past Medical History:  Diagnosis Date  . Bilateral arm pain    chronic  . Chronic hepatitis C ([place]) followed by dr comer at infectious disease   Genotype 1a--  currently on Harvoni started 09-13-2015  . Dependence on crutches    left leg prosthesis  . Drug addiction in remission ([place])    since 2007  (crack cocaine,  IV drug use)  . External hemorrhoid, bleeding   . Hand weakness   . History of esophageal stricture    post dilation x2  . History of osteosarcoma    1982  left knee---  s/p  AKA LLE--   per pt no recurrence  . History of recreational drug use multiple rehab visits   IV drug use for 1 yr and Smoked crack for 30+yrs--  per pt clean since 2007  . Hyperlipidemia   . Hypertension   . Idiopathic peripheral neuropathy   . Osteoarthritis    . Prolapsed internal hemorrhoids, grade 4   . Sigmoid diverticulosis    mild    Family History  Problem Relation Age of Onset  . COPD Father   . Alcohol abuse Father        Deceased  . Hypertension Mother        Living  . Healthy Daughter   . Healthy Son   . Anesthesia problems Neg Hx   . Breast cancer Neg Hx     Past Surgical History:  Procedure Laterality Date  . ABOVE KNEE LEG AMPUTATION Left 1982   osteosarcoma  . COLONOSCOPY  last one 02-28-2009  . ESOPHAGOGASTRODUODENOSCOPY (EGD) WITH ESOPHAGEAL DILATION  x2  last one 04-28-2008  . EXCISION BREAST BX  2015   CYST  . TONSILLECTOMY  as child  . TRANSANAL HEMORRHOIDAL DEARTERIALIZATION N/A 10/27/2015   Procedure: TRANSANAL HEMORRHOIDAL DEARTERIALIZATION;  Surgeon: Michael Boston, MD;  Location: [facility];  Service:  General;  Laterality: N/A;   Social History   Occupational History  . Not on file  Tobacco Use  . Smoking status: Former Smoker    Packs/day: 1.00    Years: 37.00    Pack years: 37.00    Types: Cigarettes    Start date: 04/02/1973    Last attempt to quit: 12/19/2014    Years since quitting: 3.5  . Smokeless tobacco: Never Used  Substance and Sexual Activity  . Alcohol use: No    Alcohol/week: 0.0 standard drinks  . Drug use: No    Types: "Crack" cocaine    Comment: recovery addict since 2007  . Sexual activity: Not on file

## 2018-07-24 NOTE — Telephone Encounter (Signed)
review 

## 2018-07-30 ENCOUNTER — Telehealth: Payer: Self-pay | Admitting: *Deleted

## 2018-07-30 NOTE — Telephone Encounter (Signed)
Thank you. I agree 

## 2018-07-30 NOTE — Telephone Encounter (Signed)
Pt calls and states she has been short of breath for 2 years, she thinks something might be "going on". She is offered a telehealth appt several times and refuses. States she needs immediate assistance. She is able to speak w/o noticeable short of breath. She states she will go to [facility].

## 2018-07-31 NOTE — Telephone Encounter (Signed)
rtc to pt to check in on her, she states she has not gotten to ur[facility] care yet due to being busy, she is offered an appt in clinic and states no, she would rather go to urg care because she needs a chestxray and some other things??? Offered appt again, refused

## 2018-08-06 ENCOUNTER — Telehealth: Payer: Self-pay | Admitting: Gastroenterology

## 2018-08-06 NOTE — Telephone Encounter (Signed)
Patient requesting 6 month [place] F/U.In Your 02/25/18 note you recommended a repeat MRI in 08/2018 to re-evaluate pancreatic cyst. Please advise

## 2018-08-06 NOTE — Telephone Encounter (Signed)
Thanks Sherlynn Stalls, should be a repeat MRI abdomen with and without contrast for pancreatic cyst. Thanks

## 2018-08-07 ENCOUNTER — Other Ambulatory Visit: Payer: Self-pay

## 2018-08-07 DIAGNOSIS — R933 Abnormal findings on diagnostic imaging of other parts of digestive tract: Secondary | ICD-10-CM

## 2018-08-07 NOTE — Telephone Encounter (Signed)
Patient scheduled for MRI of abd. w & w/o contrast at [facility]. They will call patient with appt. And instructions

## 2018-08-18 ENCOUNTER — Encounter: Payer: Self-pay | Admitting: Orthopedic Surgery

## 2018-08-22 ENCOUNTER — Ambulatory Visit
Admission: RE | Admit: 2018-08-22 | Discharge: 2018-08-22 | Disposition: A | Discharge status: Home / Self Care | Payer: 59 | Source: Ambulatory Visit | Attending: Gastroenterology | Admitting: Gastroenterology

## 2018-08-22 ENCOUNTER — Other Ambulatory Visit: Payer: Self-pay

## 2018-08-22 DIAGNOSIS — R933 Abnormal findings on diagnostic imaging of other parts of digestive tract: Secondary | ICD-10-CM

## 2018-08-22 MED ORDER — GADOBENATE DIMEGLUMINE 529 MG/ML IV SOLN
15.0000 mL | Freq: Once | INTRAVENOUS | Status: AC | PRN
  Administered 2018-08-22: 15 mL via INTRAVENOUS

## 2018-09-04 ENCOUNTER — Other Ambulatory Visit: Payer: Self-pay | Admitting: Internal Medicine

## 2018-09-04 ENCOUNTER — Telehealth: Payer: Self-pay | Admitting: Internal Medicine

## 2018-09-04 NOTE — Telephone Encounter (Signed)
Pt called with breathing problems and feeling tired all the time and wanted an appt immediately.  Offered pt to speak with the nurse Pt refused to speak with a nurse.  Asked pt to hold on so that a nurse could notified of her request.  Spoke with the Triage Nurse Bonnita Nasuti and was instructed to offer the patient an appt.  Pt sch an appt for Monday 09/08/2018 @ 8:45am with the [place].  Pt called back screaming saying the phone call was dropped and she was not addressed. Apologized for the phone call dropping and the pt slammed the phone down.

## 2018-09-08 ENCOUNTER — Other Ambulatory Visit: Payer: Self-pay

## 2018-09-08 ENCOUNTER — Encounter: Payer: Self-pay | Admitting: Internal Medicine

## 2018-09-08 ENCOUNTER — Ambulatory Visit (INDEPENDENT_AMBULATORY_CARE_PROVIDER_SITE_OTHER): Payer: 59 | Admitting: Internal Medicine

## 2018-09-08 VITALS — BP 126/81 | HR 84 | Temp 98.8°F | Wt 159.2 lb

## 2018-09-08 DIAGNOSIS — R053 Chronic cough: Secondary | ICD-10-CM | POA: Insufficient documentation

## 2018-09-08 DIAGNOSIS — E663 Overweight: Secondary | ICD-10-CM

## 2018-09-08 DIAGNOSIS — Z87891 Personal history of nicotine dependence: Secondary | ICD-10-CM

## 2018-09-08 DIAGNOSIS — R0602 Shortness of breath: Secondary | ICD-10-CM

## 2018-09-08 DIAGNOSIS — R5382 Chronic fatigue, unspecified: Secondary | ICD-10-CM | POA: Insufficient documentation

## 2018-09-08 DIAGNOSIS — R5383 Other fatigue: Secondary | ICD-10-CM | POA: Insufficient documentation

## 2018-09-08 DIAGNOSIS — Z6830 Body mass index (BMI) 30.0-30.9, adult: Secondary | ICD-10-CM

## 2018-09-08 DIAGNOSIS — R05 Cough: Secondary | ICD-10-CM | POA: Insufficient documentation

## 2018-09-08 DIAGNOSIS — Z8739 Personal history of other diseases of the musculoskeletal system and connective tissue: Secondary | ICD-10-CM

## 2018-09-08 DIAGNOSIS — M1990 Unspecified osteoarthritis, unspecified site: Secondary | ICD-10-CM

## 2018-09-08 DIAGNOSIS — Z8619 Personal history of other infectious and parasitic diseases: Secondary | ICD-10-CM

## 2018-09-08 DIAGNOSIS — I739 Peripheral vascular disease, unspecified: Secondary | ICD-10-CM

## 2018-09-08 DIAGNOSIS — Z89612 Acquired absence of left leg above knee: Secondary | ICD-10-CM

## 2018-09-08 DIAGNOSIS — I10 Essential (primary) hypertension: Secondary | ICD-10-CM

## 2018-09-08 DIAGNOSIS — K7460 Unspecified cirrhosis of liver: Secondary | ICD-10-CM

## 2018-09-08 MED ORDER — PANTOPRAZOLE SODIUM 40 MG PO TBEC: 40.0000 mg | DELAYED_RELEASE_TABLET | Freq: Every day | ORAL | 1 refills | Status: DC

## 2018-09-08 NOTE — Patient Instructions (Signed)
It was nice seeing you today. Thank you for choosing [facility] for your Primary Care.   Today we talked about:  1) shortness of breath and fatigue  - We are checking some blood work. I will call you with the results  - please pick up pantoprazole (protonix) from your pharmacy and take this once a day for the next 8 weeks  - I have referred you for a home sleep study. They will contact you to set up the appointment   FOLLOW-UP INSTRUCTIONS When: 2 weeks  For: shortness of breath and other issues on your list  What to bring: all medications   Please contact the clinic if you have any problems, or need to be seen sooner.     Sleep Apnea Sleep apnea affects breathing during sleep. It causes breathing to stop for a short time or to become shallow. It can also increase the risk of:  Heart attack.  Stroke.  Being very overweight (obese).  Diabetes.  Heart failure.  Irregular heartbeat. The goal of treatment is to help you breathe normally again. What are the causes? There are three kinds of sleep apnea:  Obstructive sleep apnea. This is caused by a blocked or collapsed airway.  Central sleep apnea. This happens when the brain does not send the right signals to the muscles that control breathing.  Mixed sleep apnea. This is a combination of obstructive and central sleep apnea. The most common cause of this condition is a collapsed or blocked airway. This can happen if:  Your throat muscles are too relaxed.  Your tongue and tonsils are too large.  You are overweight.  Your airway is too small. What increases the risk?  Being overweight.  Smoking.  Having a small airway.  Being older.  Being female.  Drinking alcohol.  Taking medicines to calm yourself (sedatives or tranquilizers).  Having family members with the condition. What are the signs or symptoms?  Trouble staying asleep.  Being sleepy or tired during the day.  Getting angry a lot.   Loud snoring.  Headaches in the morning.  Not being able to focus your mind (concentrate).  Forgetting things.  Less interest in sex.  Mood swings.  Personality changes.  Feelings of sadness (depression).  Waking up a lot during the night to pee (urinate).  Dry mouth.  Sore throat. How is this diagnosed?  Your medical history.  A physical exam.  A test that is done when you are sleeping (sleep study). The test is most often done in a sleep lab but may also be done at home. How is this treated?   Sleeping on your side.  Using a medicine to get rid of mucus in your nose (decongestant).  Avoiding the use of alcohol, medicines to help you relax, or certain pain medicines (narcotics).  Losing weight, if needed.  Changing your diet.  Not smoking.  Using a machine to open your airway while you sleep, such as: ? An oral appliance. This is a mouthpiece that shifts your lower jaw forward. ? A CPAP device. This device blows air through a mask when you breathe out (exhale). ? An EPAP device. This has valves that you put in each nostril. ? A BPAP device. This device blows air through a mask when you breathe in (inhale) and breathe out.  Having surgery if other treatments do not work. It is important to get treatment for sleep apnea. Without treatment, it can lead to:  High blood pressure.  Coronary artery disease.  In men, not being able to have an erection (impotence).  Reduced thinking ability. Follow these instructions at home: Lifestyle  Make changes that your doctor recommends.  Eat a healthy diet.  Lose weight if needed.  Avoid alcohol, medicines to help you relax, and some pain medicines.  Do not use any products that contain nicotine or tobacco, such as cigarettes, e-cigarettes, and chewing tobacco. If you need help quitting, ask your doctor. General instructions  Take over-the-counter and prescription medicines only as told by your doctor.  If  you were given a machine to use while you sleep, use it only as told by your doctor.  If you are having surgery, make sure to tell your doctor you have sleep apnea. You may need to bring your device with you.  Keep all follow-up visits as told by your doctor. This is important. Contact a doctor if:  The machine that you were given to use during sleep bothers you or does not seem to be working.  You do not get better.  You get worse. Get help right away if:  Your chest hurts.  You have trouble breathing in enough air.  You have an uncomfortable feeling in your back, arms, or stomach.  You have trouble talking.  One side of your body feels weak.  A part of your face is hanging down. These symptoms may be an emergency. Do not wait to see if the symptoms will go away. Get medical help right away. Call your local emergency services (911 in the [place] Do not drive yourself to the hospital. Summary  This condition affects breathing during sleep.  The most common cause is a collapsed or blocked airway.  The goal of treatment is to help you breathe normally while you sleep. This information is not intended to replace advice given to you by your health care provider. Make sure you discuss any questions you have with your health care provider. Document Released: 12/27/2007 Document Revised: 11/12/2017 Document Reviewed: 11/12/2017 Elsevier Interactive Patient Education  [employer].

## 2018-09-08 NOTE — Assessment & Plan Note (Signed)
Describes a morning cough with white sputum production and symptoms of heart burn. We discussed doing an 8 week trial of protonix.  - RX protonix 40mg  qd - f/u in 8 weeks, if symptoms resolved, can d/c ppi

## 2018-09-08 NOTE — Assessment & Plan Note (Signed)
Several year history. Blood work and ekg one year ago were normal, including cbc, cmp. She also describes cold chills and hair falling out. Also concern for OSA as she is overweight and describes snoring.   - home sleep study - cbc, bmp, tsh

## 2018-09-08 NOTE — Assessment & Plan Note (Addendum)
Chronic, worse in the last 3 months. Worse with exertion. Differential includes osa vs copd vs anxiety. She also describes a morning cough with white sputum production and symptoms of heart burn. We discussed doing an 8 week trial of protonix. Doubt CHF as she is not volume up on exam and denies orthopnea or PND. Will check labs to rule out metabolic abnormalities.   - sleep study - if sleep study is normal, recommend ordering PFTs  - cbc, bmp, tsh

## 2018-09-08 NOTE — Progress Notes (Signed)
   CC: fatigue   HPI:  [name] is a 58 y.o. F with PAD, HCV s/p treatment, cirrhosis, h/o osteocarcinoma s/p L AKA, osteoarthritis, and HTN presenting for eval of fatigue and shob.   She describes several years of shortness of breath and fatigue that have worsened over the last 3 months. The shob is only on exertion and she denies associated chest pain, palpitations or wheezing. She has a remote smoking history and denies PMHx of lung disease. She also denies lower extremity swelling, orthopnea, or PND. She does snore and does not feel well rested. She also describes symptoms of heart burn and morning congestion and cough productive of white mucous all of which improve throughout the day. She tried flonase for the congestion but it did not help.   Denies melena, hematochezia, n/v/d. An EKG was done in the past and was normal. She has never had a sleep study   Past Medical History:  Diagnosis Date  . Bilateral arm pain    chronic  . Chronic hepatitis C ([place]) followed by dr comer at infectious disease   Genotype 1a--  currently on Harvoni started 09-13-2015  . Dependence on crutches    left leg prosthesis  . Drug addiction in remission ([place])    since 2007  (crack cocaine,  IV drug use)  . External hemorrhoid, bleeding   . Hand weakness   . History of esophageal stricture    post dilation x2  . History of osteosarcoma    1982  left knee---  s/p  AKA LLE--   per pt no recurrence  . History of recreational drug use multiple rehab visits   IV drug use for 1 yr and Smoked crack for 30+yrs--  per pt clean since 2007  . Hyperlipidemia   . Hypertension   . Idiopathic peripheral neuropathy   . Osteoarthritis   . Prolapsed internal hemorrhoids, grade 4   . Sigmoid diverticulosis    mild    Physical Exam:  Vitals:   09/08/18 0852  BP: 126/81  Pulse: 84  Temp: 98.8 F (37.1 C)  TempSrc: Oral  SpO2: 96%  Weight: 159 lb 3.2 oz (72.2 kg)   Gen: well appearing, NAD, obese   HENT: oropharynx without exudate Cardiac: RRR, no m/r/g Pulm: CTAB Ext: no LEE  Assessment & Plan:   See Encounters Tab for problem based charting.  Patient discussed with Dr. Daryll Drown

## 2018-09-09 ENCOUNTER — Other Ambulatory Visit: Payer: Self-pay | Admitting: Internal Medicine

## 2018-09-09 ENCOUNTER — Telehealth: Payer: Self-pay | Admitting: Internal Medicine

## 2018-09-09 DIAGNOSIS — E782 Mixed hyperlipidemia: Secondary | ICD-10-CM

## 2018-09-09 DIAGNOSIS — R5382 Chronic fatigue, unspecified: Secondary | ICD-10-CM

## 2018-09-09 DIAGNOSIS — E785 Hyperlipidemia, unspecified: Secondary | ICD-10-CM | POA: Insufficient documentation

## 2018-09-09 LAB — LIPID PANEL
Chol/HDL Ratio: 5.2 ratio — ABNORMAL HIGH (ref 0.0–4.4)
Cholesterol, Total: 209 mg/dL — ABNORMAL HIGH (ref 100–199)
HDL: 40 mg/dL (ref >39)
LDL Calculated: 140 mg/dL — ABNORMAL HIGH (ref 0–99)
Triglycerides: 145 mg/dL (ref 0–149)
VLDL Cholesterol Cal: 29 mg/dL (ref 5–40)

## 2018-09-09 LAB — CBC
Hematocrit: 43.1 % (ref 34.0–46.6)
Hemoglobin: 15.2 g/dL (ref 11.1–15.9)
MCH: 31.3 pg (ref 26.6–33.0)
MCHC: 35.3 g/dL (ref 31.5–35.7)
MCV: 89 fL (ref 79–97)
Platelets: 228 10*3/uL (ref 150–450)
RBC: 4.86 x10E6/uL (ref 3.77–5.28)
RDW: 13.3 % (ref 11.7–15.4)
WBC: 5.3 10*3/uL (ref 3.4–10.8)

## 2018-09-09 LAB — BMP8+ANION GAP
Anion Gap: 17 mmol/L (ref 10.0–18.0)
BUN/Creatinine Ratio: 12 (ref 9–23)
BUN: 15 mg/dL (ref 6–24)
CO2: 20 mmol/L (ref 20–29)
Calcium: 10.5 mg/dL — ABNORMAL HIGH (ref 8.7–10.2)
Chloride: 101 mmol/L (ref 96–106)
Creatinine, Ser: 1.21 mg/dL — ABNORMAL HIGH (ref 0.57–1.00)
GFR calc Af Amer: 57 mL/min/{1.73_m2} — ABNORMAL LOW (ref >59)
GFR calc non Af Amer: 49 mL/min/{1.73_m2} — ABNORMAL LOW (ref >59)
Glucose: 87 mg/dL (ref 65–99)
Potassium: 4.3 mmol/L (ref 3.5–5.2)
Sodium: 138 mmol/L (ref 134–144)

## 2018-09-09 LAB — TSH: TSH: 2.39 u[IU]/mL (ref 0.450–4.500)

## 2018-09-09 MED ORDER — ATORVASTATIN CALCIUM 40 MG PO TABS: 40.0000 mg | ORAL_TABLET | Freq: Every day | ORAL | 2 refills | Status: DC

## 2018-09-09 NOTE — Telephone Encounter (Signed)
I just placed a referral. Thanks!

## 2018-09-09 NOTE — Telephone Encounter (Signed)
Rec'd a phone call from [place] @ [facility].  Unable to schedule this pt for a Sleep Study The pt will need a Referral placed first with their office before a Home Sleep Test can be order.  Please advise if a Referral can be placed.

## 2018-09-09 NOTE — Progress Notes (Signed)
Internal Medicine Clinic Attending  Case discussed with Dr. Vogel  at the time of the visit.  We reviewed the resident's history and exam and pertinent patient test results.  I agree with the assessment, diagnosis, and plan of care documented in the resident's note.  

## 2018-09-09 NOTE — Assessment & Plan Note (Signed)
Remains uncontrolled despite pravastatin 40mg  qd. Discussed dietary changes but patient would prefer switching to high intensity statin now and f/u discussion concerning dietary recs at next appt.   - d/c pravastatin - Rx atorvastatin 40mg  qd  - f/u appt in two weeks

## 2018-09-18 ENCOUNTER — Other Ambulatory Visit: Payer: Self-pay

## 2018-09-18 ENCOUNTER — Ambulatory Visit (INDEPENDENT_AMBULATORY_CARE_PROVIDER_SITE_OTHER): Payer: 59 | Admitting: Neurology

## 2018-09-18 ENCOUNTER — Encounter: Payer: Self-pay | Admitting: Neurology

## 2018-09-18 NOTE — Progress Notes (Signed)
Patient left without being seen; she left at 10:15 AM, appt time was 10:00.

## 2018-09-22 ENCOUNTER — Encounter: Payer: Self-pay | Admitting: Internal Medicine

## 2018-09-22 ENCOUNTER — Ambulatory Visit: Payer: 59 | Admitting: Internal Medicine

## 2018-09-22 ENCOUNTER — Other Ambulatory Visit: Payer: Self-pay | Admitting: Internal Medicine

## 2018-09-22 ENCOUNTER — Other Ambulatory Visit: Payer: Self-pay

## 2018-09-22 DIAGNOSIS — Z8639 Personal history of other endocrine, nutritional and metabolic disease: Secondary | ICD-10-CM

## 2018-09-22 DIAGNOSIS — Z1231 Encounter for screening mammogram for malignant neoplasm of breast: Secondary | ICD-10-CM

## 2018-09-22 DIAGNOSIS — R05 Cough: Secondary | ICD-10-CM | POA: Diagnosis not present

## 2018-09-22 DIAGNOSIS — R0602 Shortness of breath: Secondary | ICD-10-CM

## 2018-09-22 DIAGNOSIS — R5382 Chronic fatigue, unspecified: Secondary | ICD-10-CM | POA: Diagnosis not present

## 2018-09-22 DIAGNOSIS — Z87891 Personal history of nicotine dependence: Secondary | ICD-10-CM

## 2018-09-22 DIAGNOSIS — Z89612 Acquired absence of left leg above knee: Secondary | ICD-10-CM

## 2018-09-22 MED ORDER — DICLOFENAC SODIUM 1 % TD GEL: 4.0000 g | Freq: Four times a day (QID) | TRANSDERMAL | 2 refills | Status: DC

## 2018-09-22 NOTE — Progress Notes (Signed)
   CC: shortness of breath, fatigue  HPI:  Stacy Thomas is a 58 y.o. female with PMHx listed below who presents for 2 week follow-up of chronic shortness of breath and fatigue.   At her visit on 6/8, it was recommended to start her work-up with a sleep study to evaluate for possible OSA given her associated symptoms of snoring, day-time sleepiness and chronic fatigue. Unfortunately, Stacy Thomas left her neurology appointment on 6/18 without being seen. I spent some time further explaining the sleep study and why we need it done as part of the work-up for her shortness of breath. She seemed to have a better understanding and was willing to reschedule with neurology to have this done.   Her symptoms are unchanged from last visit.   Past Medical History:  Diagnosis Date  . Bilateral arm pain    chronic  . Chronic hepatitis C ([place]) followed by dr comer at infectious disease   Genotype 1a--  currently on Harvoni started 09-13-2015  . Dependence on crutches    left leg prosthesis  . Drug addiction in remission ([place])    since 2007  (crack cocaine,  IV drug use)  . External hemorrhoid, bleeding   . Hand weakness   . History of esophageal stricture    post dilation x2  . History of osteosarcoma    1982  left knee---  s/p  AKA LLE--   per pt no recurrence  . History of recreational drug use multiple rehab visits   IV drug use for 1 yr and Smoked crack for 30+yrs--  per pt clean since 2007  . Hyperlipidemia   . Hypertension   . Idiopathic peripheral neuropathy   . Osteoarthritis   . Prolapsed internal hemorrhoids, grade 4   . Shortness of breath   . Sigmoid diverticulosis    mild   Review of Systems:  Review of Systems  All other systems reviewed and are negative.   Physical Exam:  Vitals:   09/22/18 0903  BP: 129/84  Pulse: 87  Temp: 98.5 F (36.9 C)  TempSrc: Oral  SpO2: 99%  Weight: 161 lb 1.6 oz (73.1 kg)   General: well-appearing, obese female in NAD CV: RRR; no  murmurs, rubs or gallops Pulm: normal work of breathing; lungs CTAB Ext: s/p L AKA; RLE without edema   Assessment & Plan:   See Encounters Tab for problem based charting.  Patient discussed with Dr. Angelia Mould

## 2018-09-22 NOTE — Assessment & Plan Note (Addendum)
CBC, BMP, and TSH were unremarkable. Most likely related to chronic dyspnea on exertion which we are currently working up. If no improvement once this is addressed, would pursue further work-up. Per chart review, she has been treated for Vitamin D deficiency in 2018. Would recheck at follow-up visit with PCP.

## 2018-09-22 NOTE — Patient Instructions (Addendum)
Stacy Thomas, It was a pleasure meeting you!   Today we discussed your breathing and fatigue. Our first step in working this up will be a sleep study. Please reschedule your appointment at the neurologist office for a time that is convenient for you.   If this is normal, we will continue the work-up by looking into your lung functioning a little closer with pulmonary function testing.   I have sent in the prescription for the Voltaren gel to try for your joints. Be sure to ask your pharmacist if it is cheaper as a prescription or to buy over the counter.    Please follow-up in 3-4 weeks or as soon as you have the sleep study done.   Take care! Dr. Koleen Distance

## 2018-09-22 NOTE — Assessment & Plan Note (Signed)
Chronic complaint that is currently being worked up for OSA vs COPD. Unfortunately left neurology visit for sleep study without being seen. She was counseled on why we need this done and given further education on OSA. She will reschedule her appointment as soon as possible. Given smoking history and chronic cough, COPD is another potential etiology. Will work towards pursuing PFTs if sleep study is unremarkable.

## 2018-09-22 NOTE — Progress Notes (Signed)
Prescreened pt for 6-23 visit. No GI symptoms

## 2018-09-23 ENCOUNTER — Encounter: Payer: Self-pay | Admitting: Gastroenterology

## 2018-09-23 ENCOUNTER — Ambulatory Visit (INDEPENDENT_AMBULATORY_CARE_PROVIDER_SITE_OTHER): Payer: 59 | Admitting: Gastroenterology

## 2018-09-23 VITALS — Ht 61.0 in | Wt 161.0 lb

## 2018-09-23 DIAGNOSIS — Z8619 Personal history of other infectious and parasitic diseases: Secondary | ICD-10-CM

## 2018-09-23 DIAGNOSIS — K862 Cyst of pancreas: Secondary | ICD-10-CM

## 2018-09-23 DIAGNOSIS — K5900 Constipation, unspecified: Secondary | ICD-10-CM | POA: Diagnosis not present

## 2018-09-23 DIAGNOSIS — K7460 Unspecified cirrhosis of liver: Secondary | ICD-10-CM

## 2018-09-23 DIAGNOSIS — Z1211 Encounter for screening for malignant neoplasm of colon: Secondary | ICD-10-CM

## 2018-09-23 NOTE — Patient Instructions (Signed)
If you are age 60 or older, your body mass index should be between 23-30. Your Body mass index is 30.42 kg/m. If this is out of the aforementioned range listed, please consider follow up with your Primary Care Provider.  If you are age 12 or younger, your body mass index should be between 19-25. Your Body mass index is 30.42 kg/m. If this is out of the aformentioned range listed, please consider follow up with your Primary Care Provider.   To help prevent the possible spread of infection to our patients, communities, and staff; we will be implementing the following measures:  As of now we are not allowing any visitors/family members to accompany you to any upcoming appointments with [facility]. If you have any concerns about this please contact our office to discuss prior to the appointment.   Please go to the lab in the basement of our building to have lab work done.  Hit "B" for basement when you get on the elevator.  When the doors open the lab is on your left.  We will call you with the results. Thank you.  You need to have an US of the RUQ in November 2020 and an MRCP in May 2021. You will be contacted by the office to schedule.  You will be due for a recall colonoscopy in November 2021 and a recall EGD June 2021. We will send you a reminder in the mail when it gets closer to that time.  Thank you for entrusting me with your care and for choosing [facility], Dr. [place] Cellar

## 2018-09-23 NOTE — Progress Notes (Signed)
Virtual Visit via Video Note  I connected with Stacy Thomas on 09/23/18 at  9:00 AM EDT by a video enabled telemedicine application and verified that I am speaking with the correct person using two identifiers.  I discussed the limitations of evaluation and management by telemedicine and the availability of in person appointments. The patient expressed understanding and agreed to proceed.  THIS ENCOUNTER IS A VIRTUAL VISIT DUE TO COVID-19 - PATIENT WAS NOT SEEN IN THE OFFICE. PATIENT HAS CONSENTED TO VIRTUAL VISIT / TELEMEDICINE VISIT USING DOXIMITY APP   Location of patient: home Location of provider: office Persons participating: myself, patient   HPI :  58 y/o female with history of hep C related cirrhosis, here for a follow up visit.  She has a history of hep C diagnosed in recent years, thought to be due to IV drugs / smoked crack cocaine. Genotype 1a, treated with Harvoni for 12 weeks a few years ago with eradication.  [place] with elastography prior to therapy showing normal liver and spleen, with some F3/F4 fibrotic changes. Imaging since then has showed changes concerning for cirrhosis. She has not had any decompensations to date. She feels well in general without problems. She had an EGD in 2018 which did not show any varices. Incidentally she was noted to have H pylori gastritis and was treated successfully with Pylera, post treatment eradication testing done was negative. She is eating well, has gained weight. She is not drinking any alcohol. She has a sleep study pending for chronic fatigue workup.   No problems with bowels at baseline other than some mild constipation. She has hemorrhoids which bother her from time to time. Her last colonoscopy was in 2020, due for screening in November.   She's otherwise had surveillance for a pancreatic cyst previously noted measured 1 to 1.5cm in size. She had an MRCP in May showing stable changes, no growth since 2017. She has stable changes  of cirrhosis noted on MRI as well.   Prior workup: EGD 09/10/2016 - 3cm HH, irregular zline, no varices, H pylori gastritis, given Pylera for gastritis -   MRCP 02/11/2017 - changes of cirrhosis noted, stable 1.5cm cystic pancreatic lesion uncinate process without high risk features, subcm LI-RADS category 3 lesion in the left lobe - recommended repeat MRI in 6 months  MRI 08/23/2017 - small left lobe lesion less conspicous, stable cystic lesion in the uncinate process - repeat MRI in 1 year  RUQ [place] - 02/25/2018 - 86mm left lobe lesion stable, suspected hemangioma  MRCP 08/22/18 - stable cystic lesion of the pancreas - measuring 11 x 10mm, no change from 02/06/2016 - radiology recommending surveillance in 1 year, mild changes of cirrhosis  [place] 06/27/16 - "mild cirrhosis", no mass lesions CT abdomen 02/06/2016 - subtle changes of cirrhosis, indeterminate lesion in pancreas 1.5cm thought to represent cyst.    Past Medical History:  Diagnosis Date  . Bilateral arm pain    chronic  . Chronic hepatitis C ([place]) followed by dr comer at infectious disease   Genotype 1a--  currently on Harvoni started 09-13-2015  . Cirrhosis ([place])   . Dependence on crutches    left leg prosthesis  . Drug addiction in remission ([place])    since 2007  (crack cocaine,  IV drug use)  . External hemorrhoid, bleeding   . Hand weakness   . History of esophageal stricture    post dilation x2  . History of osteosarcoma    1982  left knee---  s/p  AKA LLE--   per pt no recurrence  . History of recreational drug use multiple rehab visits   IV drug use for 1 yr and Smoked crack for 30+yrs--  per pt clean since 2007  . Hyperlipidemia   . Hypertension   . Idiopathic peripheral neuropathy   . Osteoarthritis   . Prolapsed internal hemorrhoids, grade 4   . Shortness of breath   . Sigmoid diverticulosis    mild     Past Surgical History:  Procedure Laterality Date  . ABOVE KNEE LEG AMPUTATION Left 1982   osteosarcoma  .  COLONOSCOPY  last one 02-28-2009  . ESOPHAGOGASTRODUODENOSCOPY (EGD) WITH ESOPHAGEAL DILATION  x2  last one 04-28-2008  . EXCISION BREAST BX  2015   CYST  . TONSILLECTOMY  as child  . TRANSANAL HEMORRHOIDAL DEARTERIALIZATION N/A 10/27/2015   Procedure: TRANSANAL HEMORRHOIDAL DEARTERIALIZATION;  Surgeon: Michael Boston, MD;  [facility]: [facility];  Service: General;  Laterality: N/A;   Family History  Problem Relation Age of Onset  . COPD Father   . Alcohol abuse Father        Deceased  . Hypertension Mother        Living  . Healthy Daughter   . Healthy Son   . Anesthesia problems Neg Hx   . Breast cancer Neg Hx    Social History   Tobacco Use  . Smoking status: Former Smoker    Packs/day: 1.00    Years: 37.00    Pack years: 37.00    Types: Cigarettes    Start date: 04/02/1973    Quit date: 12/19/2014    Years since quitting: 3.7  . Smokeless tobacco: Never Used  Substance Use Topics  . Alcohol use: No    Alcohol/week: 0.0 standard drinks  . Drug use: No    Types: "Crack" cocaine    Comment: recovery addict since 2007   Current Outpatient Medications  Medication Sig Dispense Refill  . atorvastatin (LIPITOR) 40 MG tablet Take 40 mg by mouth daily.    . diclofenac sodium (VOLTAREN) 1 % GEL Apply 4 g topically 4 (four) times daily. 350 g 2  . pantoprazole (PROTONIX) 40 MG tablet Take 40 mg by mouth 2 (two) times daily.     No current facility-administered medications for this visit.    Allergies  Allergen Reactions  . Morphine Swelling    REACTION: face swells  . Codeine Other (See Comments)    Avoids due to being recovery addict     Review of Systems: All systems reviewed and negative except where noted in HPI.   Lab Results  Component Value Date   WBC 5.3 09/08/2018   HGB 15.2 09/08/2018   HCT 43.1 09/08/2018   MCV 89 09/08/2018   PLT 228 09/08/2018    Lab Results  Component Value Date   CREATININE 1.21 (H) 09/08/2018   BUN 15 09/08/2018    NA 138 09/08/2018   K 4.3 09/08/2018   CL 101 09/08/2018   CO2 20 09/08/2018    Lab Results  Component Value Date   ALT 16 08/29/2017   AST 24 08/29/2017   ALKPHOS 80 08/29/2017   BILITOT 0.7 08/29/2017    Lab Results  Component Value Date   INR 1.1 (H) 08/29/2017   INR 1.2 (H) 08/29/2016   INR 1.2 (H) 01/31/2016    No results found for: AFP   Physical Exam: Ht 5\' 1"  (1.549 m) Comment: pt provided  over the phone  Wt 161 lb (73 kg) Comment: pt provided over the phone  LMP 01/22/2011   BMI 30.42 kg/m  NA  ASSESSMENT AND PLAN: 58 y/o female here for reassessment of the following issues:  Cirrhosis / History of hep C - she has been compensated to date, and fortunately hep C was eradicated which will hopefully minimize her risk for further progression. We discussed cirrhosis, risks of decompensation and HCC moving forward. She is abstaining from alcohol. She is due for a repeat EGD in 09/2019 for varices screening, and will plan for another [place] for [facility] in November / December time frame. She is due for routine lab work at this time. She agreed with the plan, will continue to see her every 6 months or so. She is immunized to hep A and B.  Pancreatic cyst - appears stable based on imaging dating back to 2017 which is reassuring. We discussed ddx for this. Will plan on another MRCP one year from her last exam for reassessment. Does not meet criteria for EUS at this time.   Constipation - recommend using Miralax daily and titrate up as needed for constipation  Colon cancer screening - due for routine screening colonoscopy in November of this year, she understands this and will schedule in the fall.   Newport Cellar, MD [facility]

## 2018-09-24 NOTE — Progress Notes (Signed)
Internal Medicine Clinic Attending  Case discussed with Dr. Bloomfield at the time of the visit.  We reviewed the resident's history and exam and pertinent patient test results.  I agree with the assessment, diagnosis, and plan of care documented in the resident's note.  

## 2018-09-25 ENCOUNTER — Other Ambulatory Visit (INDEPENDENT_AMBULATORY_CARE_PROVIDER_SITE_OTHER): Payer: 59

## 2018-09-25 DIAGNOSIS — K7460 Unspecified cirrhosis of liver: Secondary | ICD-10-CM | POA: Diagnosis not present

## 2018-09-25 DIAGNOSIS — Z1211 Encounter for screening for malignant neoplasm of colon: Secondary | ICD-10-CM

## 2018-09-25 DIAGNOSIS — Z8619 Personal history of other infectious and parasitic diseases: Secondary | ICD-10-CM | POA: Diagnosis not present

## 2018-09-25 DIAGNOSIS — K862 Cyst of pancreas: Secondary | ICD-10-CM

## 2018-09-25 DIAGNOSIS — K5900 Constipation, unspecified: Secondary | ICD-10-CM | POA: Diagnosis not present

## 2018-09-25 LAB — HEPATIC FUNCTION PANEL
ALT: 23 U/L (ref 0–35)
AST: 30 U/L (ref 0–37)
Albumin: 4.3 g/dL (ref 3.5–5.2)
Alkaline Phosphatase: 97 U/L (ref 39–117)
Bilirubin, Direct: 0.2 mg/dL (ref 0.0–0.3)
Total Bilirubin: 0.8 mg/dL (ref 0.2–1.2)
Total Protein: 7.7 g/dL (ref 6.0–8.3)

## 2018-09-25 LAB — IBC + FERRITIN
Ferritin: 205.5 ng/mL (ref 10.0–291.0)
Iron: 95 ug/dL (ref 42–145)
Saturation Ratios: 31.3 % (ref 20.0–50.0)
Transferrin: 217 mg/dL (ref 212.0–360.0)

## 2018-09-25 LAB — PROTIME-INR
INR: 1.1 ratio — ABNORMAL HIGH (ref 0.8–1.0)
Prothrombin Time: 13.3 s — ABNORMAL HIGH (ref 9.6–13.1)

## 2018-09-28 LAB — IGG: IgG (Immunoglobin G), Serum: 1365 mg/dL (ref 600–1640)

## 2018-09-28 LAB — ANTI-SMOOTH MUSCLE ANTIBODY, IGG: Actin (Smooth Muscle) Antibody (IGG): <20 U (ref <20)

## 2018-09-28 LAB — AFP TUMOR MARKER: AFP-Tumor Marker: 5.9 ng/mL

## 2018-10-02 ENCOUNTER — Other Ambulatory Visit: Payer: Self-pay

## 2018-10-02 ENCOUNTER — Ambulatory Visit: Payer: 59 | Admitting: Neurology

## 2018-10-02 ENCOUNTER — Encounter: Payer: Self-pay | Admitting: Neurology

## 2018-10-02 VITALS — BP 139/95 | HR 75 | Ht 61.0 in | Wt 160.0 lb

## 2018-10-02 DIAGNOSIS — R0602 Shortness of breath: Secondary | ICD-10-CM | POA: Diagnosis not present

## 2018-10-02 DIAGNOSIS — R351 Nocturia: Secondary | ICD-10-CM

## 2018-10-02 DIAGNOSIS — R0683 Snoring: Secondary | ICD-10-CM

## 2018-10-02 DIAGNOSIS — R5383 Other fatigue: Secondary | ICD-10-CM

## 2018-10-02 DIAGNOSIS — E669 Obesity, unspecified: Secondary | ICD-10-CM

## 2018-10-02 NOTE — Progress Notes (Signed)
Subjective:    Patient ID: Stacy Thomas is a 58 y.o. female.  HPI     Star Age, MD, PhD [facility] [address] [place], [place] [address]  Dear Drs. Jiles Prows,   I saw your patient, Stacy Thomas, upon your kind request in my sleep clinic today for initial consultation of her sleep disorder, in particular, concern for underlying obstructive sleep apnea.  The patient is unaccompanied today.  Of note, she was originally scheduled for an appointment on 09/18/2018 but left without being seen. As you know, Stacy Thomas is a 58 year old right-handed woman with an underlying complex history of chronic hepatitis C (treated with Harvoni), left leg prosthesis due to Hx of left leg osteosarcoma over 30 years ago, history of substance abuse (in remission), esophageal stricture with status post dilatation, hypertension, hyperlipidemia, peripheral neuropathy, arthritis, diverticulosis, and borderline obesity, who reports snoring and shortness of breath with minimal exertion.  She reports a lack of energy.  She reports waking up with a sense of gasping at night and feeling of choking at night.  She lives with her husband, he works nights.  Bedtime is around 9 PM and rise time around 8 but she does have trouble maintaining sleep.  She has nocturia once per average night.  She denies recurrent morning headaches.  She is not aware of any family history of OSA.  She drinks caffeine in the form of soda, 2 cans of Pepsi per day on average, she denies alcohol use, she quit smoking some 4 years ago and has been in remission from drug abuse for 13 years.  She has 3 grown children, no pets at the house, she has a TV on at night in her bedroom and sometimes turns it off in the middle of the night.  She had a tonsillectomy as a child.  I reviewed your office note from 09/08/2018. Her Epworth sleepiness score is 3 out of 24, fatigue severity score is 26 out of 63.   Her  Past Medical History Is Significant For: Past Medical History:  Diagnosis Date  . Bilateral arm pain    chronic  . Chronic hepatitis C ([place]) followed by dr comer at infectious disease   Genotype 1a--  currently on Harvoni started 09-13-2015  . Cirrhosis ([place])   . Dependence on crutches    left leg prosthesis  . Drug addiction in remission ([place])    since 2007  (crack cocaine,  IV drug use)  . External hemorrhoid, bleeding   . Hand weakness   . History of esophageal stricture    post dilation x2  . History of osteosarcoma    1982  left knee---  s/p  AKA LLE--   per pt no recurrence  . History of recreational drug use multiple rehab visits   IV drug use for 1 yr and Smoked crack for 30+yrs--  per pt clean since 2007  . Hyperlipidemia   . Hypertension   . Idiopathic peripheral neuropathy   . Osteoarthritis   . Prolapsed internal hemorrhoids, grade 4   . Shortness of breath   . Sigmoid diverticulosis    mild    Her Past Surgical History Is Significant For: Past Surgical History:  Procedure Laterality Date  . ABOVE KNEE LEG AMPUTATION Left 1982   osteosarcoma  . COLONOSCOPY  last one 02-28-2009  . ESOPHAGOGASTRODUODENOSCOPY (EGD) WITH ESOPHAGEAL DILATION  x2  last one 04-28-2008  . EXCISION BREAST BX  2015  CYST  . TONSILLECTOMY  as child  . TRANSANAL HEMORRHOIDAL DEARTERIALIZATION N/A 10/27/2015   Procedure: TRANSANAL HEMORRHOIDAL DEARTERIALIZATION;  Surgeon: Michael Boston, MD;  Location: [facility];  Service: General;  Laterality: N/A;    Her Family History Is Significant For: Family History  Problem Relation Age of Onset  . COPD Father   . Alcohol abuse Father        Deceased  . Hypertension Mother        Living  . Healthy Daughter   . Healthy Son   . Anesthesia problems Neg Hx   . Breast cancer Neg Hx     Her Social History Is Significant For: Social History   Socioeconomic History  . Marital status: Married    Spouse name: Not on file  .  Number of children: Not on file  . Years of education: Not on file  . Highest education level: Not on file  Occupational History  . Not on file  Social Needs  . Financial resource strain: Not on file  . Food insecurity    Worry: Not on file    Inability: Not on file  . Transportation needs    Medical: Not on file    Non-medical: Not on file  Tobacco Use  . Smoking status: Former Smoker    Packs/day: 1.00    Years: 37.00    Pack years: 37.00    Types: Cigarettes    Start date: 04/02/1973    Quit date: 12/19/2014    Years since quitting: 3.7  . Smokeless tobacco: Never Used  Substance and Sexual Activity  . Alcohol use: No    Alcohol/week: 0.0 standard drinks  . Drug use: No    Types: "Crack" cocaine    Comment: recovery addict since 2007  . Sexual activity: Not on file  Lifestyle  . Physical activity    Days per week: Not on file    Minutes per session: Not on file  . Stress: Not on file  Relationships  . Social Herbalist on phone: Not on file    Gets together: Not on file    Attends religious service: Not on file    Active member of club or organization: Not on file    Attends meetings of clubs or organizations: Not on file    Relationship status: Not on file  Other Topics Concern  . Not on file  Social History Narrative   Lives with husband in a one story home.  Has 3 children.  Does not work.    Education: some college.    Her Allergies Are:  Allergies  Allergen Reactions  . Morphine Swelling    REACTION: face swells  . Codeine Other (See Comments)    Avoids due to being recovery addict  :   Her Current Medications Are:  Outpatient Encounter Medications as of 10/02/2018  Medication Sig  . atorvastatin (LIPITOR) 40 MG tablet Take 40 mg by mouth daily.  . diclofenac sodium (VOLTAREN) 1 % GEL Apply 4 g topically 4 (four) times daily.  . pantoprazole (PROTONIX) 40 MG tablet Take 40 mg by mouth 2 (two) times daily.   No facility-administered  encounter medications on file as of 10/02/2018.   :  Review of Systems:  Out of a complete 14 point review of systems, all are reviewed and negative with the exception of these symptoms as listed below: Review of Systems  Neurological:       Pt presents  today to discuss her sleep. Pt has never had a sleep study but does endorse snoring.  Epworth Sleepiness Scale 0= would never doze 1= slight chance of dozing 2= moderate chance of dozing 3= high chance of dozing  Sitting and reading: 1 Watching TV: 1 Sitting inactive in a public place (ex. Theater or meeting): 0 As a passenger in a car for an hour without a break: 0 Lying down to rest in the afternoon: 1 Sitting and talking to someone: 0 Sitting quietly after lunch (no alcohol): 0 In a car, while stopped in traffic: 0 Total: 3     Objective:  Neurological Exam  Physical Exam Physical Examination:   Vitals:   10/02/18 0844  BP: (!) 139/95  Pulse: 75    General Examination: The patient is a very pleasant 58 y.o. female in no acute distress. She appears well-developed and well-nourished and well groomed.   HEENT: Normocephalic, atraumatic, pupils are equal, round and reactive to light and accommodation.She wears corrective eyeglasses, possible mild right cataract noted. Extraocular tracking is good without limitation to gaze excursion or nystagmus noted. Normal smooth pursuit is noted. Hearing is grossly intact. Face is symmetric with normal facial animation and normal facial sensation. Speech is clear with no dysarthria noted. There is no hypophonia. There is no lip, neck/head, jaw or voice tremor. Neck is supple with full range of passive and active motion. There are no carotid bruits on auscultation. Oropharynx exam reveals: mild mouth dryness, adequate dental hygiene with several missing teeth mostly in the top, and moderate airway crowding, due to Thicker soft palate, wider uvula and slightly wider tongue noted.  Tonsils are  absent.  Mallampati is class II.  Neck circumference is 15-5/8 inches. Tongue protrudes centrally and palate elevates symmetrically.    Chest: Clear to auscultation without wheezing, rhonchi or crackles noted.  Heart: S1+S2+0, regular and normal without murmurs, rubs or gallops noted.   Abdomen: Soft, non-tender and non-distended with normal bowel sounds appreciated on auscultation.  Extremities: There is no pitting edema in the distal R lower extremity and pedal pulses are intact on the R, has L leg prosthesis.  Skin: Warm and dry without trophic changes noted.  Musculoskeletal: exam reveals no obvious joint deformities, tenderness or joint swelling or erythema.   Neurologically:  Mental status: The patient is awake, alert and oriented in all 4 spheres. Her immediate and remote memory, attention, language skills and fund of knowledge are appropriate. There is no evidence of aphasia, agnosia, apraxia or anomia. Speech is clear with normal prosody and enunciation. Thought process is linear. Mood is normal and affect is normal.  Cranial nerves II - XII are as described above under HEENT exam. In addition: shoulder shrug is normal with equal shoulder height noted. Motor exam: Normal bulk, strength and tone is noted. There is no drift, tremor or rebound. Romberg is negative. Reflexes are 2+ in the UEs and 1+ in the R knee and ankle. fine motor skills and coordination: intact with normal finger taps, normal hand movements, normal rapid alternating patting, normal foot taps and normal foot agility.  Cerebellar testing: No dysmetria or intention tremor. There is no truncal or gait ataxia.  Sensory exam: intact to light touch in the upper and lower extremities.  Gait, station and balance: She stands With mild difficulty and has to push herself up, she did not bring crutches but reports that she only uses her crutches when she does not use her prosthetic leg.She walks with a  limp on the left due to her  prosthetic leg.  Assessment and plan:  In summary, Stacy Thomas is a very pleasant 58 y.o.-year old female with an underlying complex history of chronic hepatitis C (treated with Harvoni), left leg prosthesis due to Hx of left leg osteosarcoma over 30 years ago, history of substance abuse (in remission), esophageal stricture with status post dilatation, hypertension, hyperlipidemia, peripheral neuropathy, arthritis, diverticulosis, and borderline obesity, who Presents for evaluation of her sleep disturbance including snoring, lack of energy during the day, waking up with a sense of choking and gasping at night.  She also reports shortness of breath during the day and lack of energy and lack of stamina.  She is advised that she may need further evaluation for her daytime shortness of breath, perhaps with the help of a cardiologist and/or pulmonologist.  Nevertheless, I agree that underlying obstructive sleep apnea is a possibility. I had a long chat with the patient about my findings and the diagnosis of OSA, its prognosis and treatment options. We talked about medical treatments, surgical interventions and non-pharmacological approaches. I explained in particular the risks and ramifications of untreated moderate to severe OSA, especially with respect to developing cardiovascular disease down the Road, including congestive heart failure, difficult to treat hypertension, cardiac arrhythmias, or stroke. Even type 2 diabetes has, in part, been linked to untreated OSA. Symptoms of untreated OSA include daytime sleepiness, memory problems, mood irritability and mood disorder such as depression and anxiety, lack of energy, as well as recurrent headaches, especially morning headaches. We talked about trying to maintain a healthy lifestyle in general, as well as the importance of weight control. I encouraged the patient to eat healthy, exercise daily and keep well hydrated, to keep a scheduled bedtime and wake time  routine, to not skip any meals and eat healthy snacks in between meals. I advised the patient not to drive when feeling sleepy. I recommended the following at this time: sleep study.   I explained the sleep test procedure to the patient and also outlined possible surgical and non-surgical treatment options of OSA, including the use of a custom-made dental device (which would require a referral to a specialist dentist or oral surgeon), upper airway surgical options, such as pillar implants, radiofrequency surgery, tongue base surgery, and UPPP (which would involve a referral to an ENT surgeon). Rarely, jaw surgery such as mandibular advancement may be considered.  I also explained the CPAP treatment option to the patient, who indicated that she would be willing to try CPAP if the need arises. I explained the importance of being compliant with PAP treatment, not only for insurance purposes but primarily to improve Her symptoms, and for the patient's long term health benefit, including to reduce Her cardiovascular risks. I answered all her questions today and the patient was in agreement. I plan to see her back after the sleep study is completed and encouraged her to call with any interim questions, concerns, problems or updates.   Thank you very much for allowing me to participate in the care of this nice patient. If I can be of any further assistance to you please do not hesitate to call me at [phone].  Sincerely,   Star Age, MD, PhD

## 2018-10-02 NOTE — Patient Instructions (Signed)

## 2018-10-21 ENCOUNTER — Encounter: Payer: Self-pay | Admitting: Internal Medicine

## 2018-10-27 ENCOUNTER — Ambulatory Visit (INDEPENDENT_AMBULATORY_CARE_PROVIDER_SITE_OTHER): Payer: 59 | Admitting: Neurology

## 2018-10-27 DIAGNOSIS — G4733 Obstructive sleep apnea (adult) (pediatric): Secondary | ICD-10-CM

## 2018-10-27 DIAGNOSIS — R0602 Shortness of breath: Secondary | ICD-10-CM

## 2018-10-27 DIAGNOSIS — R0683 Snoring: Secondary | ICD-10-CM

## 2018-10-27 DIAGNOSIS — R351 Nocturia: Secondary | ICD-10-CM

## 2018-10-27 DIAGNOSIS — E669 Obesity, unspecified: Secondary | ICD-10-CM

## 2018-10-27 DIAGNOSIS — R5383 Other fatigue: Secondary | ICD-10-CM

## 2018-10-30 ENCOUNTER — Telehealth: Payer: Self-pay

## 2018-10-30 NOTE — Progress Notes (Signed)
Patient referred by PCP, seen by me on 10/02/18, HST on 10/27/18.    Please call and notify the patient that the recent home sleep test showed obstructive sleep apnea in the moderate range. While I recommend treatment for this in the form CPAP, her insurance will not approve a sleep study for this. They will likely only approve a trial of autoPAP, which means, that we don't have to bring her in for a sleep study with CPAP, but will let her try an autoPAP machine at home, through a DME company (of her choice, or as per insurance requirement). The DME representative will educate her on how to use the machine, how to put the mask on, etc. I have placed an order in the chart. Please send referral, talk to patient, send report to referring MD. We will need a FU in sleep clinic for 10 weeks post-PAP set up, please arrange that with me or one of our NPs. Thanks,   Stacy Age, MD, PhD [facility])

## 2018-10-30 NOTE — Addendum Note (Signed)
Addended by: Star Age on: 10/30/2018 08:03 AM   Modules accepted: Orders

## 2018-10-30 NOTE — Procedures (Signed)
Patient Information     First Name: Stacy Last Name: Thomas ID: [id number]  Birth Date: 07-May-1960 Thomas: 58 Gender: Female  Referring Provider: Asencion Noble, MD BMI: 30.4 (W=161 lb, H=5' 1'')  Neck Circ.:  16 '' Epworth:  3/24   Sleep Study Information    Study Date: Oct 27, 2018 S/H/A Version: [id number] / 4.0.1515 / 47  History:      Stacy Thomas is a 58 year old woman with a history of chronic hepatitis C, left leg prosthesis due to Hx of left leg osteosarcoma over 30 years ago, history of substance abuse (in remission), esophageal stricture with status post dilatation, hypertension, hyperlipidemia, peripheral neuropathy, arthritis, diverticulosis, and borderline obesity, who reports snoring and shortness of breath with minimal exertion.  Summary & Diagnosis:     OSA   Recommendations:     This home sleep test demonstrates moderate obstructive sleep apnea with a total AHI of 27.4/hour and O2 nadir of 86%. Given the patient's medical history and sleep related complaints, treatment with positive airway pressure (in the form of CPAP) is recommended. This will require a full night CPAP titration study for proper treatment settings, O2 monitoring and mask fitting. The patient's insurance has denied an attended sleep study; therefore, the patient will be advised to proceed with an autoPAP titration/trial at home for now. Please note that untreated obstructive sleep apnea may carry additional perioperative morbidity. Patients with significant obstructive sleep apnea should receive perioperative PAP therapy and the surgeons and particularly the anesthesiologist should be informed of the diagnosis and the severity of the sleep disordered breathing. The patient should be cautioned not to drive, work at heights, or operate dangerous or heavy equipment when tired or sleepy. Review and reiteration of good sleep hygiene measures should be pursued with any patient. Other causes of the patient's  symptoms, including circadian rhythm disturbances, an underlying mood disorder, medication effect and/or an underlying medical problem cannot be ruled out based on this test. Clinical correlation is recommended. The patient and his referring provider will be notified of the test results. The patient will be seen in follow up in sleep clinic at [facility].  I certify that I have reviewed the raw data recording prior to the issuance of this report in accordance with the standards of the American Academy of Sleep Medicine (AASM).  Stacy Age, MD, PhD [facility]) Diplomat, ABPN (Neurology and Sleep)           Sleep Summary  Oxygen Saturation Statistics   Start Study Time: End Study Time: Total Recording Time:  10:29:37 PM   6:10:25 AM   7 h, 40 min  Total Sleep Time % REM of Sleep Time:  5 h, 45 min  23.7    Mean: 93 Minimum: 86 Maximum: 99  Mean of Desaturations Nadirs (%):   90  Oxygen Desaturation. %:   4-9 10-20 >20 Total  Events Number Total    72  3 96.0 4.0  0 0.0  75 100.0  Oxygen Saturation: <90 <=88 <85 <80 <70  Duration (minutes): Sleep % 15.7 4.5  3.2 0.0  0.9 0.0 0.0 0.0 0.0 0.0     Respiratory Indices      Total Events REM NREM All Night  pRDI:  162  pAHI:  156 ODI:  75  pAHIc:  4  % CSR: 0.0 50.6 48.4 37.2 2.6 21.6 20.9 5.7 0.5 28.4 27.4 13.2 0.8       Pulse Rate  Statistics during Sleep (BPM)      Mean: 76 Minimum: 52 Maximum: 104    Indices are calculated using technically valid sleep time of  5 hrs, 41 min. Central-Indices are calculated using technically valid sleep time of  5  hrs, 7 min. pRDI/pAHI are calculated using oxi desaturations ? 3%  Body Position Statistics  Position Supine Prone Right Left Non-Supine  Sleep (min) 269.0 8.0 1.0 66.8 75.8  Sleep % 77.8 2.3 0.3 19.3 21.9  pRDI 29.0 N/A N/A 29.5 26.7  pAHI 28.3 N/A N/A 26.8 24.3  ODI 15.3 N/A N/A 5.5 5.7     Snoring Statistics Snoring Level (dB)  >40 >50 >60 >70 >80 >Threshold (45)  Sleep (min) 150.1 3.9 1.9 0.0 0.0 9.0  Sleep % 43.4 1.1 0.5 0.0 0.0 2.6    Mean: 41 dB Sleep Stages Chart                                          pAHI=27.4                                                      Mild              Moderate                    Severe                                                 5              15                    30

## 2018-10-30 NOTE — Telephone Encounter (Signed)
-----   Message from Star Age, MD sent at 10/30/2018  8:03 AM EDT ----- Patient referred by PCP, seen by me on 10/02/18, HST on 10/27/18.    Please call and notify the patient that the recent home sleep test showed obstructive sleep apnea in the moderate range. While I recommend treatment for this in the form CPAP, her insurance will not approve a sleep study for this. They will likely only approve a trial of autoPAP, which means, that we don't have to bring her in for a sleep study with CPAP, but will let her try an autoPAP machine at home, through a DME company (of her choice, or as per insurance requirement). The DME representative will educate her on how to use the machine, how to put the mask on, etc. I have placed an order in the chart. Please send referral, talk to patient, send report to referring MD. We will need a FU in sleep clinic for 10 weeks post-PAP set up, please arrange that with me or one of our NPs. Thanks,   Star Age, MD, PhD [facility])

## 2018-10-30 NOTE — Telephone Encounter (Signed)
I called pt. I advised pt that Dr. Rexene Alberts reviewed their sleep study results and found that pt has moderate osa. Dr. Rexene Alberts recommends that pt start an auto pap at home since her insurance will likely deny an in lab titration study. I reviewed PAP compliance expectations with the pt. Pt is agreeable to starting an auto-PAP. I advised pt that an order will be sent to a DME, [facility], and [facility] will call the pt within about one week after they file with the pt's insurance. [facility] will show the pt how to use the machine, fit for masks, and troubleshoot the auto-PAP if needed. A follow up appt was made for insurance purposes with Jinny Blossom, NP on 02/04/19 at 8:00am. Pt verbalized understanding to arrive 15 minutes early and bring their auto-PAP. A letter with all of this information in it will be mailed to the pt as a reminder. I verified with the pt that the address we have on file is correct. Pt verbalized understanding of results. Pt had no questions at this time but was encouraged to call back if questions arise. I have sent the order to [facility] and have received confirmation that they have received the order.

## 2018-11-05 ENCOUNTER — Ambulatory Visit
Admission: RE | Admit: 2018-11-05 | Discharge: 2018-11-05 | Disposition: A | Discharge status: Home / Self Care | Payer: 59 | Source: Ambulatory Visit | Attending: *Deleted | Admitting: *Deleted

## 2018-11-05 ENCOUNTER — Other Ambulatory Visit: Payer: Self-pay

## 2018-11-05 DIAGNOSIS — Z1231 Encounter for screening mammogram for malignant neoplasm of breast: Secondary | ICD-10-CM

## 2018-11-12 ENCOUNTER — Telehealth: Payer: Self-pay | Admitting: Orthopedic Surgery

## 2018-11-12 NOTE — Telephone Encounter (Signed)
Patient called concerning the form she left a week ago for Dr Sharol Given to complete for her handicap placard. The number to contact patient is [phone]

## 2018-11-13 NOTE — Telephone Encounter (Signed)
Waiting on Dr Sharol Given to sign off today and will call patient to let her know pick up date. Will hold message.

## 2018-11-14 NOTE — Telephone Encounter (Signed)
I called pt and advised that from is at the front desk for pick up.

## 2018-12-23 ENCOUNTER — Telehealth: Payer: Self-pay | Admitting: *Deleted

## 2018-12-23 ENCOUNTER — Encounter: Payer: Self-pay | Admitting: Internal Medicine

## 2018-12-23 DIAGNOSIS — I10 Essential (primary) hypertension: Secondary | ICD-10-CM

## 2018-12-23 NOTE — Telephone Encounter (Signed)
Please review, it looks as though another provider stopped pt's maxide. If it should continue please send new script to [facility]

## 2018-12-24 ENCOUNTER — Other Ambulatory Visit: Payer: Self-pay

## 2018-12-24 MED ORDER — TRIAMTERENE-HCTZ 37.5-25 MG PO TABS: 1.0000 | ORAL_TABLET | Freq: Every day | ORAL | 1 refills | Status: DC

## 2018-12-24 NOTE — Telephone Encounter (Signed)
Requesting Triamterene-HCTZ to be filled @  [facility] [place] - [address] [phone] (Phone) [phone] (Fax)

## 2019-01-02 NOTE — Telephone Encounter (Signed)
This has been done.

## 2019-01-16 ENCOUNTER — Ambulatory Visit: Payer: Self-pay

## 2019-01-16 ENCOUNTER — Other Ambulatory Visit: Payer: Self-pay

## 2019-01-16 ENCOUNTER — Ambulatory Visit (INDEPENDENT_AMBULATORY_CARE_PROVIDER_SITE_OTHER): Payer: 59 | Admitting: Family

## 2019-01-16 ENCOUNTER — Encounter: Payer: Self-pay | Admitting: Family

## 2019-01-16 VITALS — Ht 61.0 in | Wt 160.0 lb

## 2019-01-16 DIAGNOSIS — M25551 Pain in right hip: Secondary | ICD-10-CM | POA: Diagnosis not present

## 2019-01-16 DIAGNOSIS — M1711 Unilateral primary osteoarthritis, right knee: Secondary | ICD-10-CM

## 2019-01-16 NOTE — Progress Notes (Signed)
Office Visit Note   Patient: Stacy Thomas           Date of Birth: [birth year]           MRN: [id number] Visit Date: 01/16/2019              Requested by: Asencion Noble, MD [address],  [place] [address] PCP: Asencion Noble, MD  Chief Complaint  Patient presents with  . Right Knee - Pain    Cortisone injection preferred      HPI: The patient is a 58 year old woman who presents today complaining of right hip as well as right knee pain.  She has a history of osteoarthritis of the right knee has had cortisone injections in the past and is requesting one today.  She is complaining of aching pain this is worse with ambulation.  Complains of start up stiffness and some mechanical symptoms.  Complaining of right hip pain that radiates into her groin this is worse with ambulation denies any relieving factors.  Does have a history of osteoarthritis of the right hip has had cortisone injections intra-articular in the past.  Assessment & Plan: Visit Diagnoses:  1. Right hip pain     Plan: We will proceed with Depo-Medrol injection of the right knee today, referred for her hip injection.  She will follow-up in the office as needed  Follow-Up Instructions: Return if symptoms worsen or fail to improve.   Right Knee Exam   Tenderness  The patient is experiencing tenderness in the medial joint line and lateral joint line.  Range of Motion  The patient has normal right knee ROM.  Tests  Varus: negative Valgus: negative  Other  Erythema: absent Swelling: mild      Patient is alert, oriented, no adenopathy, well-dressed, normal affect, normal respiratory effort.   Imaging: US Guided Needle Placement  Result Date: 01/16/2019 Please see Notes tab for imaging impression.  No images are attached to the encounter.  Labs: Lab Results  Component Value Date   HGBA1C 5.4 03/05/2016     Lab Results  Component Value Date   ALBUMIN 4.3 09/25/2018   ALBUMIN 4.2 08/29/2017   ALBUMIN 4.2 06/19/2016    No results found for: MG Lab Results  Component Value Date   VD25OH 15.8 (L) 05/03/2016    No results found for: PREALBUMIN CBC EXTENDED Latest Ref Rng & Units 09/08/2018 08/29/2017 08/29/2016  WBC 3.4 - 10.8 x10E3/uL 5.3 4.3 5.1  RBC 3.77 - 5.28 x10E6/uL 4.86 4.96 4.86  HGB 11.1 - 15.9 g/dL 15.2 15.2(H) 15.0  HCT 34.0 - 46.6 % 43.1 44.5 44.3  PLT 150 - 450 x10E3/uL 228 198.0 191.0  NEUTROABS 1.4 - 7.7 K/uL - 1.5 1.8  LYMPHSABS 0.7 - 4.0 K/uL - 2.3 2.7     Body mass index is 30.23 kg/m.  Orders:  Orders Placed This Encounter  Procedures  . US Guided Needle Placement   No orders of the defined types were placed in this encounter.    Procedures: Large Joint Inj: R knee on 01/21/2019 11:17 AM Indications: pain Details: 18 G 1.5 in needle, anteromedial approach Medications: 5 mL lidocaine 1 %; 40 mg methylPREDNISolone acetate 40 MG/ML Consent was given by the patient.      Clinical Data: No additional findings.  ROS:  All other systems negative, except as noted in the HPI. Review of Systems  Constitutional: Negative for chills and fever.  Musculoskeletal: Positive for arthralgias. Negative  for joint swelling and myalgias.    Objective: Vital Signs: Ht 5\' 1"  (1.549 m)   Wt 160 lb (72.6 kg)   LMP 01/22/2011   BMI 30.23 kg/m   Specialty Comments:  No specialty comments available.  PMFS History: Patient Active Problem List   Diagnosis Date Noted  . HLD (hyperlipidemia) 09/09/2018  . Shortness of breath 09/08/2018  . Chronic fatigue 09/08/2018  . Chronic cough 09/08/2018  . Allergic sinusitis 12/03/2017  . Osteoarthritis of right hip 12/03/2017  . Hx of AKA (above knee amputation), left ([place]) 02/18/2017  . Unilateral primary osteoarthritis, right knee 02/18/2017  . Aphthous ulcer of tongue 10/29/2016  . Vitamin D deficiency 05/04/2016  . Bilateral primary osteoarthritis of hip 04/26/2016  . Bilateral  carpal tunnel syndrome 03/29/2016  . Paresthesia of skin 03/23/2016  . Atrophic vaginitis 03/06/2016  . Screening for cervical cancer 03/06/2016  . PVD (peripheral vascular disease) ([place]) 03/05/2016  . Pancreatic abnormality 03/05/2016  . HTN (hypertension) 03/05/2016  . Hepatic cirrhosis ([place]) 11/15/2015  . External hemorrhoids with pain & bleeding 10/27/2015  . Chronic hepatitis C ([place]) 06/15/2015  . Transaminitis 06/14/2015  . Prolapsed internal hemorrhoids, grade 4 06/14/2015  . Peripheral neuropathy 06/14/2015  . Unilateral AKA ([place])- left 06/14/2015  . Hx of osteosarcoma 06/14/2015  . DYSPHAGIA 06/13/2007  . ABDOMINAL PAIN-LUQ 06/13/2007   Past Medical History:  Diagnosis Date  . Bilateral arm pain    chronic  . Chronic hepatitis C ([place]) followed by dr comer at infectious disease   Genotype 1a--  currently on Harvoni started 09-13-2015  . Cirrhosis ([place])   . Dependence on crutches    left leg prosthesis  . Drug addiction in remission ([place])    since 2007  (crack cocaine,  IV drug use)  . External hemorrhoid, bleeding   . Hand weakness   . History of esophageal stricture    post dilation x2  . History of osteosarcoma    1982  left knee---  s/p  AKA LLE--   per pt no recurrence  . History of recreational drug use multiple rehab visits   IV drug use for 1 yr and Smoked crack for 30+yrs--  per pt clean since 2007  . Hyperlipidemia   . Hypertension   . Idiopathic peripheral neuropathy   . Osteoarthritis   . Prolapsed internal hemorrhoids, grade 4   . Shortness of breath   . Sigmoid diverticulosis    mild    Family History  Problem Relation Age of Onset  . COPD Father   . Alcohol abuse Father        Deceased  . Hypertension Mother        Living  . Healthy Daughter   . Healthy Son   . Anesthesia problems Neg Hx   . Breast cancer Neg Hx     Past Surgical History:  Procedure Laterality Date  . ABOVE KNEE LEG AMPUTATION Left 1982   osteosarcoma  . COLONOSCOPY   last one 02-28-2009  . ESOPHAGOGASTRODUODENOSCOPY (EGD) WITH ESOPHAGEAL DILATION  x2  last one 04-28-2008  . EXCISION BREAST BX  2015   CYST  . TONSILLECTOMY  as child  . TRANSANAL HEMORRHOIDAL DEARTERIALIZATION N/A 10/27/2015   Procedure: TRANSANAL HEMORRHOIDAL DEARTERIALIZATION;  Surgeon: Michael Boston, MD;  Location: [facility];  Service: General;  Laterality: N/A;   Social History   Occupational History  . Not on file  Tobacco Use  . Smoking status: Former Smoker    Packs/day:  1.00    Years: 37.00    Pack years: 37.00    Types: Cigarettes    Start date: 04/02/1973    Quit date: 12/19/2014    Years since quitting: 4.0  . Smokeless tobacco: Never Used  Substance and Sexual Activity  . Alcohol use: No    Alcohol/week: 0.0 standard drinks  . Drug use: No    Types: "Crack" cocaine    Comment: recovery addict since 2007  . Sexual activity: Not on file

## 2019-01-16 NOTE — Progress Notes (Signed)
Subjective: Patient is here for ultrasound-guided intra-articular right hip injection.   Has DJD, previous injections have helped for 5-6 months.  Objective:  Pain with passive flexion and IR.  Procedure: Ultrasound-guided right hip injection: After sterile prep with Betadine, injected 8 cc 1% lidocaine without epinephrine and 40 mg methylprednisolone using a 22-gauge spinal needle, passing the needle through the iliofemoral ligament into the femoral head/neck junction.  Injectate seen filling joint capsule.  Had good immediate relief.

## 2019-01-21 DIAGNOSIS — M1711 Unilateral primary osteoarthritis, right knee: Secondary | ICD-10-CM | POA: Diagnosis not present

## 2019-01-21 MED ORDER — LIDOCAINE HCL 1 % IJ SOLN
5.0000 mL | INTRAMUSCULAR | Status: AC | PRN
  Administered 2019-01-21: 5 mL

## 2019-01-21 MED ORDER — METHYLPREDNISOLONE ACETATE 40 MG/ML IJ SUSP
40.0000 mg | INTRAMUSCULAR | Status: AC | PRN
  Administered 2019-01-21: 40 mg via INTRA_ARTICULAR

## 2019-02-01 ENCOUNTER — Encounter: Payer: Self-pay | Admitting: Adult Health

## 2019-02-02 ENCOUNTER — Encounter: Payer: Self-pay | Admitting: Gastroenterology

## 2019-02-03 ENCOUNTER — Encounter: Payer: Self-pay | Admitting: Gastroenterology

## 2019-02-04 ENCOUNTER — Other Ambulatory Visit: Payer: Self-pay

## 2019-02-04 ENCOUNTER — Encounter: Payer: Self-pay | Admitting: Adult Health

## 2019-02-04 ENCOUNTER — Ambulatory Visit: Payer: 59 | Admitting: Adult Health

## 2019-02-04 VITALS — BP 116/66 | HR 72 | Temp 97.5°F | Ht 61.0 in | Wt 160.4 lb

## 2019-02-04 DIAGNOSIS — Z9989 Dependence on other enabling machines and devices: Secondary | ICD-10-CM | POA: Diagnosis not present

## 2019-02-04 DIAGNOSIS — G4733 Obstructive sleep apnea (adult) (pediatric): Secondary | ICD-10-CM

## 2019-02-04 NOTE — Progress Notes (Signed)
[facility] received new cpap orders today.

## 2019-02-04 NOTE — Progress Notes (Addendum)
PATIENT: Stacy Thomas DOB: 1960-04-19  REASON FOR VISIT: follow up HISTORY FROM: patient  HISTORY OF PRESENT ILLNESS: Today 02/04/19:  Ms. Stacy Thomas is a 58 year old female with a history of obstructive sleep apnea on CPAP.  She returns today for follow-up.  Her download indicates that she use her machine 29 out of 30 days for compliance of 97%.  She use her machine greater than 4 hours 24 days for compliance of 80%.  On average she uses her machine 5 hours and 6 minutes.  Her residual AHI is 1.8 on 7-13 cmH2O with EPR 3.  Her pressure in the 95th percentile is 12.8 cm of water.  The patient states that she has noticed the benefit with the CPAP.  She feels that she has more energy during the day.  She does feel at times that she needs more air with the machine.  She returns today for an evaluation.  HISTORY Stacy Thomas is a 58 year old right-handed woman with an underlying complex history of chronic hepatitis C (treated with Harvoni), left leg prosthesis due to Hx of left leg osteosarcoma over 30 years ago, history of substance abuse (in remission), esophageal stricture with status post dilatation, hypertension, hyperlipidemia, peripheral neuropathy, arthritis, diverticulosis, and borderline obesity, who reports snoring and shortness of breath with minimal exertion.  She reports a lack of energy.  She reports waking up with a sense of gasping at night and feeling of choking at night.  She lives with her husband, he works nights.  Bedtime is around 9 PM and rise time around 8 but she does have trouble maintaining sleep.  She has nocturia once per average night.  She denies recurrent morning headaches.  She is not aware of any family history of OSA.  She drinks caffeine in the form of soda, 2 cans of Pepsi per day on average, she denies alcohol use, she quit smoking some 4 years ago and has been in remission from drug abuse for 13 years.  She has 3 grown children, no pets at the house, she has a TV on at  night in her bedroom and sometimes turns it off in the middle of the night.  She had a tonsillectomy as a child.  I reviewed your office note from 09/08/2018. Her Epworth sleepiness score is 3 out of 24, fatigue severity score is 26 out of 63.    REVIEW OF SYSTEMS: Out of a complete 14 system review of symptoms, the patient complains only of the following symptoms, and all other reviewed systems are negative.  Numbness  ALLERGIES: Allergies  Allergen Reactions  . Morphine Swelling    REACTION: face swells  . Codeine Other (See Comments)    Avoids due to being recovery addict    HOME MEDICATIONS: Outpatient Medications Prior to Visit  Medication Sig Dispense Refill  . atorvastatin (LIPITOR) 40 MG tablet Take 40 mg by mouth daily.    . diclofenac sodium (VOLTAREN) 1 % GEL Apply 4 g topically 4 (four) times daily. 350 g 2  . pantoprazole (PROTONIX) 40 MG tablet Take 40 mg by mouth 2 (two) times daily.    Marland Kitchen triamterene-hydrochlorothiazide (MAXZIDE-25) 37.5-25 MG tablet Take 1 tablet by mouth daily. 90 tablet 1   No facility-administered medications prior to visit.     PAST MEDICAL HISTORY: Past Medical History:  Diagnosis Date  . Bilateral arm pain    chronic  . Chronic hepatitis C ([place]) followed by dr comer at infectious disease   Genotype  1a--  currently on Harvoni started 09-13-2015  . Cirrhosis ([place])   . Dependence on crutches    left leg prosthesis  . Drug addiction in remission ([place])    since 2007  (crack cocaine,  IV drug use)  . External hemorrhoid, bleeding   . Hand weakness   . History of esophageal stricture    post dilation x2  . History of osteosarcoma    1982  left knee---  s/p  AKA LLE--   per pt no recurrence  . History of recreational drug use multiple rehab visits   IV drug use for 1 yr and Smoked crack for 30+yrs--  per pt clean since 2007  . Hyperlipidemia   . Hypertension   . Idiopathic peripheral neuropathy   . Osteoarthritis   . Prolapsed internal  hemorrhoids, grade 4   . Shortness of breath   . Sigmoid diverticulosis    mild    PAST SURGICAL HISTORY: Past Surgical History:  Procedure Laterality Date  . ABOVE KNEE LEG AMPUTATION Left 1982   osteosarcoma  . COLONOSCOPY  last one 02-28-2009  . ESOPHAGOGASTRODUODENOSCOPY (EGD) WITH ESOPHAGEAL DILATION  x2  last one 04-28-2008  . EXCISION BREAST BX  2015   CYST  . TONSILLECTOMY  as child  . TRANSANAL HEMORRHOIDAL DEARTERIALIZATION N/A 10/27/2015   Procedure: TRANSANAL HEMORRHOIDAL DEARTERIALIZATION;  Surgeon: Michael Boston, MD;  Location: [facility];  Service: General;  Laterality: N/A;    FAMILY HISTORY: Family History  Problem Relation Age of Onset  . COPD Father   . Alcohol abuse Father        Deceased  . Hypertension Mother        Living  . Healthy Daughter   . Healthy Son   . Anesthesia problems Neg Hx   . Breast cancer Neg Hx     SOCIAL HISTORY: Social History   Socioeconomic History  . Marital status: Married    Spouse name: Not on file  . Number of children: Not on file  . Years of education: Not on file  . Highest education level: Not on file  Occupational History  . Not on file  Social Needs  . Financial resource strain: Not on file  . Food insecurity    Worry: Not on file    Inability: Not on file  . Transportation needs    Medical: Not on file    Non-medical: Not on file  Tobacco Use  . Smoking status: Former Smoker    Packs/day: 1.00    Years: 37.00    Pack years: 37.00    Types: Cigarettes    Start date: 04/02/1973    Quit date: 12/19/2014    Years since quitting: 4.1  . Smokeless tobacco: Never Used  Substance and Sexual Activity  . Alcohol use: No    Alcohol/week: 0.0 standard drinks  . Drug use: No    Types: "Crack" cocaine    Comment: recovery addict since 2007  . Sexual activity: Not on file  Lifestyle  . Physical activity    Days per week: Not on file    Minutes per session: Not on file  . Stress: Not on file   Relationships  . Social Herbalist on phone: Not on file    Gets together: Not on file    Attends religious service: Not on file    Active member of club or organization: Not on file    Attends meetings of clubs or organizations:  Not on file    Relationship status: Not on file  . Intimate partner violence    Fear of current or ex partner: Not on file    Emotionally abused: Not on file    Physically abused: Not on file    Forced sexual activity: Not on file  Other Topics Concern  . Not on file  Social History Narrative   Lives with husband in a one story home.  Has 3 children.  Does not work.    Education: some college.      PHYSICAL EXAM  Vitals:   02/04/19 0757  BP: 116/66  Pulse: 72  Temp: (!) 97.5 F (36.4 C)  Weight: 160 lb 6.4 oz (72.8 kg)  Height: 5\' 1"  (1.549 m)   Body mass index is 30.31 kg/m.  Generalized: Well developed, in no acute distress  Chest: Lungs clear to auscultation bilaterally  Neurological examination  Mentation: Alert oriented to time, place, history taking. Follows all commands speech and language fluent Cranial nerve II-XII: Extraocular movements were full, visual field were full on confrontational test Head turning and shoulder shrug  were normal and symmetric. Motor: Good strength throughout.  Above-the-knee amputation on the left. Sensory: Sensory testing is intact to soft touch on all 4 extremities. No evidence of extinction is noted.      DIAGNOSTIC DATA (LABS, IMAGING, TESTING) - I reviewed patient records, labs, notes, testing and imaging myself where available.  Lab Results  Component Value Date   WBC 5.3 09/08/2018   HGB 15.2 09/08/2018   HCT 43.1 09/08/2018   MCV 89 09/08/2018   PLT 228 09/08/2018      Component Value Date/Time   NA 138 09/08/2018 0937   K 4.3 09/08/2018 0937   CL 101 09/08/2018 0937   CO2 20 09/08/2018 0937   GLUCOSE 87 09/08/2018 0937   GLUCOSE 111 (H) 08/29/2017 1058   BUN 15  09/08/2018 0937   CREATININE 1.21 (H) 09/08/2018 0937   CREATININE 1.20 (H) 06/19/2016 1604   CALCIUM 10.5 (H) 09/08/2018 0937   PROT 7.7 09/25/2018 0923   PROT 7.5 06/13/2015 1549   ALBUMIN 4.3 09/25/2018 0923   ALBUMIN 3.9 06/13/2015 1549   AST 30 09/25/2018 0923   ALT 23 09/25/2018 0923   ALKPHOS 97 09/25/2018 0923   BILITOT 0.8 09/25/2018 0923   BILITOT 0.6 06/13/2015 1549   GFRNONAA 49 (L) 09/08/2018 0937   GFRNONAA 50 (L) 06/19/2016 1604   GFRAA 57 (L) 09/08/2018 0937   GFRAA 58 (L) 06/19/2016 1604   Lab Results  Component Value Date   CHOL 209 (H) 09/08/2018   HDL 40 09/08/2018   LDLCALC 140 (H) 09/08/2018   TRIG 145 09/08/2018   CHOLHDL 5.2 (H) 09/08/2018   Lab Results  Component Value Date   HGBA1C 5.4 03/05/2016   Lab Results  Component Value Date   VITAMINB12 914 06/13/2015   Lab Results  Component Value Date   TSH 2.390 09/08/2018      ASSESSMENT AND PLAN 58 y.o. year old female  has a past medical history of Bilateral arm pain, Chronic hepatitis C ([place]) (followed by dr comer at infectious disease), Cirrhosis ([place]), Dependence on crutches, Drug addiction in remission [facility]), External hemorrhoid, bleeding, Hand weakness, History of esophageal stricture, History of osteosarcoma, History of recreational drug use (multiple rehab visits), Hyperlipidemia, Hypertension, Idiopathic peripheral neuropathy, Osteoarthritis, Prolapsed internal hemorrhoids, grade 4, Shortness of breath, and Sigmoid diverticulosis. here with:  1. Obstructive sleep apnea on CPAP  Patient CPAP download shows excellent compliance and good treatment of her apnea.  She is encouraged to continue using CPAP nightly and greater than 4 hours each night.  I will increase pressure 7-13 cm of water.  She is advised that if her symptoms worsen or she develops new symptoms she should let us know.  She will follow-up in 1 year or sooner if needed    I spent 15 minutes with the patient. 50% of this time  was spent reviewing her CPAP download   Ward Givens, MSN, NP-C 02/04/2019, 8:43 AM [facility] [address] [place], [place] [address] [phone]  I reviewed the above note and documentation by the Nurse Practitioner and agree with the history, exam, assessment and plan as outlined above. I was available for consultation. Star Age, MD, PhD [facility])

## 2019-02-04 NOTE — Patient Instructions (Signed)

## 2019-02-05 ENCOUNTER — Telehealth: Payer: Self-pay

## 2019-02-05 DIAGNOSIS — K7460 Unspecified cirrhosis of liver: Secondary | ICD-10-CM

## 2019-02-05 DIAGNOSIS — Z8619 Personal history of other infectious and parasitic diseases: Secondary | ICD-10-CM

## 2019-02-05 NOTE — Telephone Encounter (Signed)
Called and spoke to pt.  She would like to schedule her U/S for one day next week and prefers early morning. Scheduled her at [facility] on Wednesday, 11-11 at 7:00am.  Letter sent to pt with instructions at [facility] per her request.

## 2019-02-05 NOTE — Telephone Encounter (Signed)
-----   Message from Roetta Sessions, [place] sent at 09/23/2018  9:47 AM EDT ----- Regarding: RUQ ultrasound RUQ Ultrasound due in November 2020  Cirrhosis with/out Hx of Hep C

## 2019-02-11 ENCOUNTER — Other Ambulatory Visit: Payer: Self-pay

## 2019-02-11 ENCOUNTER — Ambulatory Visit (HOSPITAL_COMMUNITY)
Admission: RE | Admit: 2019-02-11 | Discharge: 2019-02-11 | Disposition: A | Discharge status: Home / Self Care | Payer: 59 | Source: Ambulatory Visit | Attending: Gastroenterology | Admitting: Gastroenterology

## 2019-02-11 DIAGNOSIS — R933 Abnormal findings on diagnostic imaging of other parts of digestive tract: Secondary | ICD-10-CM

## 2019-02-11 DIAGNOSIS — K7460 Unspecified cirrhosis of liver: Secondary | ICD-10-CM

## 2019-02-11 DIAGNOSIS — Z8619 Personal history of other infectious and parasitic diseases: Secondary | ICD-10-CM

## 2019-02-11 DIAGNOSIS — K769 Liver disease, unspecified: Secondary | ICD-10-CM

## 2019-03-09 ENCOUNTER — Ambulatory Visit (AMBULATORY_SURGERY_CENTER): Payer: Self-pay

## 2019-03-09 ENCOUNTER — Other Ambulatory Visit: Payer: Self-pay

## 2019-03-09 VITALS — Temp 96.6°F | Ht 61.0 in | Wt 160.2 lb

## 2019-03-09 DIAGNOSIS — Z1211 Encounter for screening for malignant neoplasm of colon: Secondary | ICD-10-CM

## 2019-03-09 MED ORDER — NA SULFATE-K SULFATE-MG SULF 17.5-3.13-1.6 GM/177ML PO SOLN: 1.0000 | Freq: Once | ORAL | 0 refills | Status: AC

## 2019-03-09 NOTE — Progress Notes (Signed)
Denies allergies to eggs or soy products. Denies complication of anesthesia or sedation. Denies use of weight loss medication. Denies use of O2.   Emmi instructions given for colonoscopy.  Covid screening is scheduled for 03/18/19 @ 9:10 Am. A 15.00 coupon for Suprep was given to the patient.

## 2019-03-10 ENCOUNTER — Encounter: Payer: Self-pay | Admitting: Gastroenterology

## 2019-03-18 ENCOUNTER — Other Ambulatory Visit: Payer: Self-pay | Admitting: Gastroenterology

## 2019-03-18 ENCOUNTER — Other Ambulatory Visit (INDEPENDENT_AMBULATORY_CARE_PROVIDER_SITE_OTHER): Payer: 59

## 2019-03-18 ENCOUNTER — Ambulatory Visit (INDEPENDENT_AMBULATORY_CARE_PROVIDER_SITE_OTHER): Payer: 59

## 2019-03-18 DIAGNOSIS — K7460 Unspecified cirrhosis of liver: Secondary | ICD-10-CM

## 2019-03-18 DIAGNOSIS — Z8619 Personal history of other infectious and parasitic diseases: Secondary | ICD-10-CM

## 2019-03-18 DIAGNOSIS — R933 Abnormal findings on diagnostic imaging of other parts of digestive tract: Secondary | ICD-10-CM | POA: Diagnosis not present

## 2019-03-18 DIAGNOSIS — Z1159 Encounter for screening for other viral diseases: Secondary | ICD-10-CM

## 2019-03-18 DIAGNOSIS — K769 Liver disease, unspecified: Secondary | ICD-10-CM

## 2019-03-18 LAB — COMPREHENSIVE METABOLIC PANEL
ALT: 28 U/L (ref 0–35)
AST: 31 U/L (ref 0–37)
Albumin: 4.3 g/dL (ref 3.5–5.2)
Alkaline Phosphatase: 108 U/L (ref 39–117)
BUN: 17 mg/dL (ref 6–23)
CO2: 27 mEq/L (ref 19–32)
Calcium: 9.5 mg/dL (ref 8.4–10.5)
Chloride: 106 mEq/L (ref 96–112)
Creatinine, Ser: 1.07 mg/dL (ref 0.40–1.20)
GFR: 63.22 mL/min (ref >60.00)
Glucose, Bld: 122 mg/dL — ABNORMAL HIGH (ref 70–99)
Potassium: 3.9 mEq/L (ref 3.5–5.1)
Sodium: 138 mEq/L (ref 135–145)
Total Bilirubin: 0.7 mg/dL (ref 0.2–1.2)
Total Protein: 7.7 g/dL (ref 6.0–8.3)

## 2019-03-18 LAB — PROTIME-INR
INR: 1.2 ratio — ABNORMAL HIGH (ref 0.8–1.0)
Prothrombin Time: 13.9 s — ABNORMAL HIGH (ref 9.6–13.1)

## 2019-03-18 LAB — CBC WITH DIFFERENTIAL/PLATELET
Basophils Absolute: 0 10*3/uL (ref 0.0–0.1)
Basophils Relative: 0.2 % (ref 0.0–3.0)
Eosinophils Absolute: 0.2 10*3/uL (ref 0.0–0.7)
Eosinophils Relative: 3.4 % (ref 0.0–5.0)
HCT: 42 % (ref 36.0–46.0)
Hemoglobin: 14.3 g/dL (ref 12.0–15.0)
Lymphocytes Relative: 46.7 % — ABNORMAL HIGH (ref 12.0–46.0)
Lymphs Abs: 2.4 10*3/uL (ref 0.7–4.0)
MCHC: 34.1 g/dL (ref 30.0–36.0)
MCV: 90.1 fl (ref 78.0–100.0)
Monocytes Absolute: 0.6 10*3/uL (ref 0.1–1.0)
Monocytes Relative: 11 % (ref 3.0–12.0)
Neutro Abs: 2 10*3/uL (ref 1.4–7.7)
Neutrophils Relative %: 38.7 % — ABNORMAL LOW (ref 43.0–77.0)
Platelets: 202 10*3/uL (ref 150.0–400.0)
RBC: 4.67 Mil/uL (ref 3.87–5.11)
RDW: 13.2 % (ref 11.5–15.5)
WBC: 5.1 10*3/uL (ref 4.0–10.5)

## 2019-03-19 LAB — AFP TUMOR MARKER: AFP-Tumor Marker: 5.8 ng/mL

## 2019-03-19 LAB — SARS CORONAVIRUS 2 (TAT 6-24 HRS): SARS Coronavirus 2: NEGATIVE

## 2019-03-23 ENCOUNTER — Encounter: Payer: Self-pay | Admitting: Gastroenterology

## 2019-03-23 ENCOUNTER — Ambulatory Visit (AMBULATORY_SURGERY_CENTER): Payer: 59 | Admitting: Gastroenterology

## 2019-03-23 ENCOUNTER — Other Ambulatory Visit: Payer: Self-pay

## 2019-03-23 VITALS — BP 136/92 | HR 85 | Temp 98.1°F | Resp 15 | Ht 62.0 in | Wt 160.2 lb

## 2019-03-23 DIAGNOSIS — D123 Benign neoplasm of transverse colon: Secondary | ICD-10-CM

## 2019-03-23 DIAGNOSIS — Z1211 Encounter for screening for malignant neoplasm of colon: Secondary | ICD-10-CM

## 2019-03-23 DIAGNOSIS — D122 Benign neoplasm of ascending colon: Secondary | ICD-10-CM | POA: Diagnosis not present

## 2019-03-23 MED ORDER — SODIUM CHLORIDE 0.9 % IV SOLN: 500.0000 mL | Freq: Once | INTRAVENOUS | Status: DC

## 2019-03-23 NOTE — Op Note (Signed)
[facility] Patient Name: Stacy Thomas Procedure Date: 03/23/2019 10:07 AM MRN: [id number] Endoscopist: Remo Lipps P. Havery Moros , MD Age: 58 Referring MD:  Date of Birth: [birth year] Gender: Female Account #: [id number] Procedure:                Colonoscopy Indications:              Screening for colorectal malignant neoplasm Medicines:                Monitored Anesthesia Care Procedure:                Pre-Anesthesia Assessment:                           - Prior to the procedure, a History and Physical                            was performed, and patient medications and                            allergies were reviewed. The patient's tolerance of                            previous anesthesia was also reviewed. The risks                            and benefits of the procedure and the sedation                            options and risks were discussed with the patient.                            All questions were answered, and informed consent                            was obtained. Prior Anticoagulants: The patient has                            taken no previous anticoagulant or antiplatelet                            agents. ASA Grade Assessment: II - A patient with                            mild systemic disease. After reviewing the risks                            and benefits, the patient was deemed in                            satisfactory condition to undergo the procedure.                           After obtaining informed consent, the colonoscope  was passed under direct vision. Throughout the                            procedure, the patient's blood pressure, pulse, and                            oxygen saturations were monitored continuously. The                            Colonoscope was introduced through the anus and                            advanced to the the cecum, identified by                            appendiceal orifice  and ileocecal valve. The                            colonoscopy was performed without difficulty. The                            patient tolerated the procedure well. The quality                            of the bowel preparation was good. The ileocecal                            valve, appendiceal orifice, and rectum were                            photographed. Scope In: 10:13:06 AM Scope Out: 10:42:52 AM Scope Withdrawal Time: 0 hours 25 minutes 20 seconds  Total Procedure Duration: 0 hours 29 minutes 46 seconds  Findings:                 The perianal and digital rectal examinations were                            normal.                           A diminutive polyp was found in the distal                            ascending colon. The polyp was sessile. The polyp                            was removed with a cold biopsy forceps. Resection                            and retrieval were complete.                           Two sessile polyps were found in the distal  ascending colon. The polyps were 4 to 6 mm in size.                            These polyps were removed with a cold snare.                            Resection and retrieval were complete.                           One roughly 5 mm subepithelial nodule was found in                            the distal ascending colon. It was hard to                            palpation with the forceps. Multiple bite on                            biopsies were taken with a cold forceps for                            histology. Area just distal to the lesion was                            tattooed with an injection of Spot (carbon black).                           A diminutive polyp was found in the transverse                            colon. The polyp was sessile. The polyp was removed                            with a cold snare. Resection and retrieval were                            complete.                            Internal hemorrhoids were found during retroflexion.                           There was evidence of scarring in the rectum from                            prior hemorrhoidectomy. The colon was tortous. The                            exam was otherwise without abnormality. Complications:            No immediate complications. Estimated blood loss:                            Minimal.  Estimated Blood Loss:     Estimated blood loss was minimal. Impression:               - One diminutive polyp in the distal ascending                            colon, removed with a cold biopsy forceps. Resected                            and retrieved.                           - Two 4 to 6 mm polyps in the distal ascending                            colon, removed with a cold snare. Resected and                            retrieved.                           - One subepithelial nodule in the distal ascending                            colon as described. Biopsied. Tattooed.                           - One diminutive polyp in the transverse colon,                            removed with a cold snare. Resected and retrieved.                           - Internal hemorrhoids.                           - Tortous colon.                           - The examination was otherwise normal. Recommendation:           - Patient has a contact number available for                            emergencies. The signs and symptoms of potential                            delayed complications were discussed with the                            patient. Return to normal activities tomorrow.                            Written discharge instructions were provided to the  patient.                           - Resume previous diet.                           - Continue present medications.                           - Await pathology results. Remo Lipps P. Shamarie Call, MD 03/23/2019 10:50:56 AM This  report has been signed electronically.

## 2019-03-23 NOTE — Patient Instructions (Signed)
Information on polyps and hemorrhoids given to you today.  Await pathology results.  YOU HAD AN ENDOSCOPIC PROCEDURE TODAY AT THE [facility]:   Refer to the procedure report that was given to you for any specific questions about what was found during the examination.  If the procedure report does not answer your questions, please call your gastroenterologist to clarify.  If you requested that your care partner not be given the details of your procedure findings, then the procedure report has been included in a sealed envelope for you to review at your convenience later.  YOU SHOULD EXPECT: Some feelings of bloating in the abdomen. Passage of more gas than usual.  Walking can help get rid of the air that was put into your GI tract during the procedure and reduce the bloating. If you had a lower endoscopy (such as a colonoscopy or flexible sigmoidoscopy) you may notice spotting of blood in your stool or on the toilet paper. If you underwent a bowel prep for your procedure, you may not have a normal bowel movement for a few days.  Please Note:  You might notice some irritation and congestion in your nose or some drainage.  This is from the oxygen used during your procedure.  There is no need for concern and it should clear up in a day or so.  SYMPTOMS TO REPORT IMMEDIATELY:   Following lower endoscopy (colonoscopy or flexible sigmoidoscopy):  Excessive amounts of blood in the stool  Significant tenderness or worsening of abdominal pains  Swelling of the abdomen that is new, acute  Fever of 100F or higher   For urgent or emergent issues, a gastroenterologist can be reached at any hour by calling [phone].   DIET:  We do recommend a small meal at first, but then you may proceed to your regular diet.  Drink plenty of fluids but you should avoid alcoholic beverages for 24 hours.  ACTIVITY:  You should plan to take it easy for the rest of today and you should NOT DRIVE or use  heavy machinery until tomorrow (because of the sedation medicines used during the test).    FOLLOW UP: Our staff will call the number listed on your records 48-72 hours following your procedure to check on you and address any questions or concerns that you may have regarding the information given to you following your procedure. If we do not reach you, we will leave a message.  We will attempt to reach you two times.  During this call, we will ask if you have developed any symptoms of COVID 19. If you develop any symptoms (ie: fever, flu-like symptoms, shortness of breath, cough etc.) before then, please call [phone].  If you test positive for Covid 19 in the 2 weeks post procedure, please call and report this information to us.    If any biopsies were taken you will be contacted by phone or by letter within the next 1-3 weeks.  Please call us at [phone] if you have not heard about the biopsies in 3 weeks.    SIGNATURES/CONFIDENTIALITY: You and/or your care partner have signed paperwork which will be entered into your electronic medical record.  These signatures attest to the fact that that the information above on your After Visit Summary has been reviewed and is understood.  Full responsibility of the confidentiality of this discharge information lies with you and/or your care-partner. 

## 2019-03-23 NOTE — Progress Notes (Signed)
Temp  VS DT   Pt's states no medical or surgical changes since previsit or office visit. 

## 2019-03-23 NOTE — Progress Notes (Signed)
Called to room to assist during endoscopic procedure.  Patient ID and intended procedure confirmed with present staff. Received instructions for my participation in the procedure from the performing physician.  

## 2019-03-23 NOTE — Progress Notes (Signed)
A and O x3. Report to RN. Tolerated MAC anesthesia well.

## 2019-03-25 ENCOUNTER — Telehealth: Payer: Self-pay

## 2019-03-25 NOTE — Telephone Encounter (Signed)
  Follow up Call-  Call back number 03/23/2019 09/10/2016  Post procedure Call Back phone  # [phone]  Permission to leave phone message Yes Yes  Some recent data might be hidden     Patient questions:  Do you have a fever, pain , or abdominal swelling? No. Pain Score  0 *  Have you tolerated food without any problems? Yes.    Have you been able to return to your normal activities? Yes.    Do you have any questions about your discharge instructions: Diet   No. Medications  No. Follow up visit  No.  Do you have questions or concerns about your Care? No.  Actions: * If pain score is 4 or above: No action needed, pain <4.  1. Have you developed a fever since your procedure? no  2.   Have you had an respiratory symptoms (SOB or cough) since your procedure? no  3.   Have you tested positive for COVID 19 since your procedure no  4.   Have you had any family members/close contacts diagnosed with the COVID 19 since your procedure?  no   If yes to any of these questions please route to Joylene John, RN and Alphonsa Gin, Therapist, sports.

## 2019-04-06 ENCOUNTER — Other Ambulatory Visit: Payer: Self-pay

## 2019-04-06 DIAGNOSIS — D122 Benign neoplasm of ascending colon: Secondary | ICD-10-CM

## 2019-04-09 ENCOUNTER — Telehealth: Payer: Self-pay | Admitting: Gastroenterology

## 2019-04-09 NOTE — Telephone Encounter (Signed)
Called patient and answered her questions about drinking the contrast

## 2019-04-10 ENCOUNTER — Ambulatory Visit (HOSPITAL_COMMUNITY)
Admission: RE | Admit: 2019-04-10 | Discharge: 2019-04-10 | Disposition: A | Discharge status: Home / Self Care | Payer: 59 | Source: Ambulatory Visit | Attending: Gastroenterology | Admitting: Gastroenterology

## 2019-04-10 ENCOUNTER — Telehealth: Payer: Self-pay

## 2019-04-10 ENCOUNTER — Other Ambulatory Visit: Payer: Self-pay

## 2019-04-10 ENCOUNTER — Encounter (HOSPITAL_COMMUNITY): Payer: Self-pay

## 2019-04-10 DIAGNOSIS — D122 Benign neoplasm of ascending colon: Secondary | ICD-10-CM | POA: Insufficient documentation

## 2019-04-10 MED ORDER — SODIUM CHLORIDE (PF) 0.9 % IJ SOLN
INTRAMUSCULAR | Status: AC
  Filled 2019-04-10: qty 50

## 2019-04-10 MED ORDER — IOHEXOL 300 MG/ML  SOLN
100.0000 mL | Freq: Once | INTRAMUSCULAR | Status: AC | PRN
  Administered 2019-04-10: 100 mL via INTRAVENOUS

## 2019-04-10 NOTE — Telephone Encounter (Signed)
Patient called wanting to know what the CT results she saw meant. I let her know Dr Havery Moros did not have a chance to review it yet, but we would call her as soon as it has been reviewed. She was good with that and wanted Dr.Armbruster to know she appreciates his care of her.

## 2019-04-10 NOTE — Telephone Encounter (Signed)
I sent the patient a mychart message with results and recommendations

## 2019-06-29 ENCOUNTER — Encounter: Payer: Self-pay | Admitting: Physician Assistant

## 2019-06-29 ENCOUNTER — Other Ambulatory Visit: Payer: Self-pay

## 2019-06-29 ENCOUNTER — Ambulatory Visit (INDEPENDENT_AMBULATORY_CARE_PROVIDER_SITE_OTHER): Payer: 59 | Admitting: Physician Assistant

## 2019-06-29 DIAGNOSIS — M1711 Unilateral primary osteoarthritis, right knee: Secondary | ICD-10-CM

## 2019-06-29 MED ORDER — LIDOCAINE HCL 1 % IJ SOLN
5.0000 mL | INTRAMUSCULAR | Status: AC | PRN
  Administered 2019-06-29: 5 mL

## 2019-06-29 MED ORDER — METHYLPREDNISOLONE ACETATE 40 MG/ML IJ SUSP
40.0000 mg | INTRAMUSCULAR | Status: AC | PRN
  Administered 2019-06-29: 40 mg via INTRA_ARTICULAR

## 2019-06-29 NOTE — Progress Notes (Signed)
Office Visit Note   Patient: Stacy Thomas           Date of Birth: May 17, 1960           MRN: [id number] Visit Date: 06/29/2019              Requested by: Asencion Noble, MD [address],  [place] [address] PCP: Asencion Noble, MD  Chief Complaint  Patient presents with  . Right Knee - Pain, Follow-up      HPI: Patient presents in follow-up today requesting a injection into her right knee.  She has a history of right knee arthritis and has done well with injections.  Last one was in October.  She now states she is beginning to have more pain  Assessment & Plan: Visit Diagnoses: No diagnosis found.  Plan: She may follow-up as needed  Follow-Up Instructions: No follow-ups on file.   Ortho Exam  Patient is alert, oriented, no adenopathy, well-dressed, normal affect, normal respiratory effort. Focused examination of her right knee demonstrates tenderness with palpation throughout the joint line no effusion mild soft tissue swelling no cellulitis  Imaging: No results found. No images are attached to the encounter.  Labs: Lab Results  Component Value Date   HGBA1C 5.4 03/05/2016     Lab Results  Component Value Date   ALBUMIN 4.3 03/18/2019   ALBUMIN 4.3 09/25/2018   ALBUMIN 4.2 08/29/2017    No results found for: MG Lab Results  Component Value Date   VD25OH 15.8 (L) 05/03/2016    No results found for: PREALBUMIN CBC EXTENDED Latest Ref Rng & Units 03/18/2019 09/08/2018 08/29/2017  WBC 4.0 - 10.5 K/uL 5.1 5.3 4.3  RBC 3.87 - 5.11 Mil/uL 4.67 4.86 4.96  HGB 12.0 - 15.0 g/dL 14.3 15.2 15.2(H)  HCT 36.0 - 46.0 % 42.0 43.1 44.5  PLT 150.0 - 400.0 K/uL 202.0 228 198.0  NEUTROABS 1.4 - 7.7 K/uL 2.0 - 1.5  LYMPHSABS 0.7 - 4.0 K/uL 2.4 - 2.3     There is no height or weight on file to calculate BMI.  Orders:  No orders of the defined types were placed in this encounter.  No orders of the defined types were placed in this encounter.    Procedures: Large Joint Inj on 06/29/2019 2:58 PM Indications: pain and diagnostic evaluation Details: 22 G 1.5 in needle, anterolateral approach  Arthrogram: No  Medications: 40 mg methylPREDNISolone acetate 40 MG/ML; 5 mL lidocaine 1 % Outcome: tolerated well, no immediate complications Procedure, treatment alternatives, risks and benefits explained, specific risks discussed. Consent was given by the patient.      Clinical Data: No additional findings.  ROS:  All other systems negative, except as noted in the HPI. Review of Systems  Objective: Vital Signs: LMP 01/22/2011   Specialty Comments:  No specialty comments available.  PMFS History: Patient Active Problem List   Diagnosis Date Noted  . HLD (hyperlipidemia) 09/09/2018  . Shortness of breath 09/08/2018  . Chronic fatigue 09/08/2018  . Chronic cough 09/08/2018  . Allergic sinusitis 12/03/2017  . Osteoarthritis of right hip 12/03/2017  . Hx of AKA (above knee amputation), left ([place]) 02/18/2017  . Unilateral primary osteoarthritis, right knee 02/18/2017  . Aphthous ulcer of tongue 10/29/2016  . Vitamin D deficiency 05/04/2016  . Bilateral primary osteoarthritis of hip 04/26/2016  . Bilateral carpal tunnel syndrome 03/29/2016  . Paresthesia of skin 03/23/2016  . Atrophic vaginitis 03/06/2016  . Screening for cervical  cancer 03/06/2016  . PVD (peripheral vascular disease) ([place]) 03/05/2016  . Pancreatic abnormality 03/05/2016  . HTN (hypertension) 03/05/2016  . Hepatic cirrhosis ([place]) 11/15/2015  . External hemorrhoids with pain & bleeding 10/27/2015  . Chronic hepatitis C ([place]) 06/15/2015  . Transaminitis 06/14/2015  . Prolapsed internal hemorrhoids, grade 4 06/14/2015  . Peripheral neuropathy 06/14/2015  . Unilateral AKA ([place])- left 06/14/2015  . Hx of osteosarcoma 06/14/2015  . DYSPHAGIA 06/13/2007  . ABDOMINAL PAIN-LUQ 06/13/2007   Past Medical History:  Diagnosis Date  . Bilateral arm pain     chronic  . Chronic hepatitis C ([place]) followed by dr comer at infectious disease   Genotype 1a--  currently on Harvoni started 09-13-2015  . Cirrhosis ([place])   . Dependence on crutches    left leg prosthesis  . Drug addiction in remission ([place])    since 2007  (crack cocaine,  IV drug use)  . External hemorrhoid, bleeding   . GERD (gastroesophageal reflux disease)   . Hand weakness   . History of esophageal stricture    post dilation x2  . History of osteosarcoma    1982  left knee---  s/p  AKA LLE--   per pt no recurrence  . History of recreational drug use multiple rehab visits   IV drug use for 1 yr and Smoked crack for 30+yrs--  per pt clean since 2007  . Hyperlipidemia   . Hypertension   . Idiopathic peripheral neuropathy   . Myocardial infarction ([place])   . Osteoarthritis   . osteosarcoma    age 27 surg only lt leg  . Prolapsed internal hemorrhoids, grade 4   . Shortness of breath   . Sigmoid diverticulosis    mild  . Sleep apnea    On CPAP  . Substance abuse ([place])    Clean for 14 years    Family History  Problem Relation Age of Onset  . COPD Father   . Alcohol abuse Father        Deceased  . Hypertension Mother        Living  . Healthy Daughter   . Healthy Son   . Anesthesia problems Neg Hx   . Breast cancer Neg Hx   . Colon cancer Neg Hx   . Esophageal cancer Neg Hx   . Rectal cancer Neg Hx   . Stomach cancer Neg Hx     Past Surgical History:  Procedure Laterality Date  . ABOVE KNEE LEG AMPUTATION Left 1982   osteosarcoma  . COLONOSCOPY  last one 02-28-2009  . ESOPHAGOGASTRODUODENOSCOPY (EGD) WITH ESOPHAGEAL DILATION  x2  last one 04-28-2008  . EXCISION BREAST BX  2015   CYST  . TONSILLECTOMY  as child  . TRANSANAL HEMORRHOIDAL DEARTERIALIZATION N/A 10/27/2015   Procedure: TRANSANAL HEMORRHOIDAL DEARTERIALIZATION;  Surgeon: Michael Boston, MD;  Location: [facility];  Service: General;  Laterality: N/A;   Social History   Occupational  History  . Not on file  Tobacco Use  . Smoking status: Former Smoker    Packs/day: 1.00    Years: 37.00    Pack years: 37.00    Types: Cigarettes    Start date: 04/02/1973    Quit date: 12/19/2014    Years since quitting: 4.5  . Smokeless tobacco: Never Used  Substance and Sexual Activity  . Alcohol use: No    Alcohol/week: 0.0 standard drinks  . Drug use: No    Types: "Crack" cocaine    Comment: recovery  addict since 2007  . Sexual activity: Not on file

## 2019-07-20 ENCOUNTER — Telehealth: Payer: Self-pay

## 2019-07-20 ENCOUNTER — Ambulatory Visit: Payer: 59 | Admitting: Family Medicine

## 2019-07-20 ENCOUNTER — Encounter: Payer: Self-pay | Admitting: Family Medicine

## 2019-07-20 ENCOUNTER — Other Ambulatory Visit: Payer: Self-pay

## 2019-07-20 ENCOUNTER — Ambulatory Visit: Payer: Self-pay

## 2019-07-20 DIAGNOSIS — M25551 Pain in right hip: Secondary | ICD-10-CM | POA: Diagnosis not present

## 2019-07-20 NOTE — Progress Notes (Signed)
Office Visit Note   Patient: Stacy Thomas           Date of Birth: 1960/10/11           MRN: [id number] Visit Date: 07/20/2019 Requested by: Asencion Noble, MD [address],  [place] [address] PCP: Asencion Noble, MD  Subjective: Chief Complaint  Patient presents with  . Right Hip - Pain    HPI: She is here with recurrent right hip pain.  Last injection in October helped quite a bit until about 2 weeks ago.  Groin pain with radiation down the anterior thigh.              ROS:   All other systems were reviewed and are negative.  Objective: Vital Signs: LMP 01/22/2011   Physical Exam:  General:  Alert and oriented, in no acute distress. Pulm:  Breathing unlabored. Psy:  Normal mood, congruent affect. Skin: No visible rash Right hip: She has pain with internal rotation but still pretty good range of motion.  Imaging: None other than for needle guidance  Assessment & Plan: 1.  Recurrent right hip pain due to osteoarthritis -Discussed with patient and she wants another injection today.  We will do this under ultrasound guidance.  Follow-up as needed.     Procedures: Ultrasound-guided right hip injection: After sterile prep with Betadine, injected 8 cc 1% lidocaine without epinephrine and 40 mg methylprednisolone using a 22-gauge spinal needle, passing the needle through the iliofemoral ligament into the femoral head/neck junction.  Injectate seen filling the joint capsule.  Good immediate relief.     PMFS History: Patient Active Problem List   Diagnosis Date Noted  . HLD (hyperlipidemia) 09/09/2018  . Shortness of breath 09/08/2018  . Chronic fatigue 09/08/2018  . Chronic cough 09/08/2018  . Allergic sinusitis 12/03/2017  . Osteoarthritis of right hip 12/03/2017  . Hx of AKA (above knee amputation), left ([place]) 02/18/2017  . Unilateral primary osteoarthritis, right knee 02/18/2017  . Aphthous ulcer of tongue 10/29/2016  . Vitamin D deficiency  05/04/2016  . Bilateral primary osteoarthritis of hip 04/26/2016  . Bilateral carpal tunnel syndrome 03/29/2016  . Paresthesia of skin 03/23/2016  . Atrophic vaginitis 03/06/2016  . Screening for cervical cancer 03/06/2016  . PVD (peripheral vascular disease) ([place]) 03/05/2016  . Pancreatic abnormality 03/05/2016  . HTN (hypertension) 03/05/2016  . Hepatic cirrhosis ([place]) 11/15/2015  . External hemorrhoids with pain & bleeding 10/27/2015  . Chronic hepatitis C ([place]) 06/15/2015  . Transaminitis 06/14/2015  . Prolapsed internal hemorrhoids, grade 4 06/14/2015  . Peripheral neuropathy 06/14/2015  . Unilateral AKA ([place])- left 06/14/2015  . Hx of osteosarcoma 06/14/2015  . DYSPHAGIA 06/13/2007  . ABDOMINAL PAIN-LUQ 06/13/2007   Past Medical History:  Diagnosis Date  . Bilateral arm pain    chronic  . Chronic hepatitis C ([place]) followed by dr comer at infectious disease   Genotype 1a--  currently on Harvoni started 09-13-2015  . Cirrhosis ([place])   . Dependence on crutches    left leg prosthesis  . Drug addiction in remission ([place])    since 2007  (crack cocaine,  IV drug use)  . External hemorrhoid, bleeding   . GERD (gastroesophageal reflux disease)   . Hand weakness   . History of esophageal stricture    post dilation x2  . History of osteosarcoma    1982  left knee---  s/p  AKA LLE--   per pt no recurrence  . History of  recreational drug use multiple rehab visits   IV drug use for 1 yr and Smoked crack for 30+yrs--  per pt clean since 2007  . Hyperlipidemia   . Hypertension   . Idiopathic peripheral neuropathy   . Myocardial infarction ([place])   . Osteoarthritis   . osteosarcoma    age 33 surg only lt leg  . Prolapsed internal hemorrhoids, grade 4   . Shortness of breath   . Sigmoid diverticulosis    mild  . Sleep apnea    On CPAP  . Substance abuse ([place])    Clean for 14 years    Family History  Problem Relation Age of Onset  . COPD Father   . Alcohol abuse Father          Deceased  . Hypertension Mother        Living  . Healthy Daughter   . Healthy Son   . Anesthesia problems Neg Hx   . Breast cancer Neg Hx   . Colon cancer Neg Hx   . Esophageal cancer Neg Hx   . Rectal cancer Neg Hx   . Stomach cancer Neg Hx     Past Surgical History:  Procedure Laterality Date  . ABOVE KNEE LEG AMPUTATION Left 1982   osteosarcoma  . COLONOSCOPY  last one 02-28-2009  . ESOPHAGOGASTRODUODENOSCOPY (EGD) WITH ESOPHAGEAL DILATION  x2  last one 04-28-2008  . EXCISION BREAST BX  2015   CYST  . TONSILLECTOMY  as child  . TRANSANAL HEMORRHOIDAL DEARTERIALIZATION N/A 10/27/2015   Procedure: TRANSANAL HEMORRHOIDAL DEARTERIALIZATION;  Surgeon: Caydin Yeatts Boston, MD;  Location: [facility];  Service: General;  Laterality: N/A;   Social History   Occupational History  . Not on file  Tobacco Use  . Smoking status: Former Smoker    Packs/day: 1.00    Years: 37.00    Pack years: 37.00    Types: Cigarettes    Start date: 04/02/1973    Quit date: 12/19/2014    Years since quitting: 4.5  . Smokeless tobacco: Never Used  Substance and Sexual Activity  . Alcohol use: No    Alcohol/week: 0.0 standard drinks  . Drug use: No    Types: "Crack" cocaine    Comment: recovery addict since 2007  . Sexual activity: Not on file

## 2019-07-20 NOTE — Telephone Encounter (Signed)
I looked through dictation and referrals and it looks like this pt was referred to Dr. Junius Roads for a hip injection but does not appear to have had it back in October? She is insistent that she did have it and wants to have another one. Is this something that we can make an appt for with Dr.Hilts?

## 2019-07-20 NOTE — Telephone Encounter (Signed)
Yes, that's fine 

## 2019-07-20 NOTE — Telephone Encounter (Signed)
I called the patient. She will come in this afternoon at 2:40. She asked that I cancel the 4/22 appointment that she had scheduled earlier.

## 2019-07-20 NOTE — Telephone Encounter (Signed)
This patient had her hip injected under ultrasound 01/16/20 at the request of [place]. She is in much pain and would like this repeated. Ok to schedule? This is Dr. Jess Barters patient.

## 2019-07-20 NOTE — Telephone Encounter (Signed)
Patient called and states she wants a "right hip bone inj" not with Dr. Ernestina Patches with the other Dr around the corner. I asked did she know the name of the Other Dr. She state she does not know but wants the injection now. I offered her an appt and she declined bc Dr. Sharol Given cannot do this injection she said.  She asked to speak to Autumn. I did advise her Autumn is in clinic and that I would send a message to her. Patient was very upset and wants a CB ASAP. She asked if I could send message as urgent.   CB [phone]  [phone]

## 2019-07-23 ENCOUNTER — Ambulatory Visit: Payer: 59 | Admitting: Family Medicine

## 2019-08-01 ENCOUNTER — Other Ambulatory Visit: Payer: Self-pay

## 2019-08-01 DIAGNOSIS — K862 Cyst of pancreas: Secondary | ICD-10-CM

## 2019-08-01 NOTE — Progress Notes (Signed)
Order for MRCP Abdomen W WO - pancreatic cyst

## 2019-08-03 ENCOUNTER — Telehealth: Payer: Self-pay

## 2019-08-03 NOTE — Telephone Encounter (Signed)
-----   Message from Roetta Sessions, [place] sent at 09/23/2018  9:45 AM EDT ----- Regarding: mrcp due May MRCP due in May 2021  Cirrhosis with/out Hx of Hep C Pancreatic cyst

## 2019-08-03 NOTE — Telephone Encounter (Signed)
Called and scheduled pt for MRCP at [facility] on Monday, 5-17th at 9:00am. To Arrive at 8:30am. Called pt. She is agreeable to date and time. MyChart message sent to pt.

## 2019-08-05 ENCOUNTER — Ambulatory Visit: Payer: 59 | Admitting: Adult Health

## 2019-08-11 ENCOUNTER — Ambulatory Visit: Payer: 59 | Admitting: Adult Health

## 2019-08-17 ENCOUNTER — Other Ambulatory Visit: Payer: Self-pay

## 2019-08-17 ENCOUNTER — Other Ambulatory Visit: Payer: Self-pay | Admitting: Gastroenterology

## 2019-08-17 ENCOUNTER — Ambulatory Visit (HOSPITAL_COMMUNITY)
Admission: RE | Admit: 2019-08-17 | Discharge: 2019-08-17 | Disposition: A | Discharge status: Home / Self Care | Payer: 59 | Source: Ambulatory Visit | Attending: Gastroenterology | Admitting: Gastroenterology

## 2019-08-17 DIAGNOSIS — K862 Cyst of pancreas: Secondary | ICD-10-CM

## 2019-08-17 MED ORDER — GADOBUTROL 1 MMOL/ML IV SOLN
7.0000 mL | Freq: Once | INTRAVENOUS | Status: AC | PRN
  Administered 2019-08-17: 7 mL via INTRAVENOUS

## 2019-08-24 ENCOUNTER — Ambulatory Visit: Payer: 59 | Admitting: Internal Medicine

## 2019-08-24 ENCOUNTER — Encounter: Payer: Self-pay | Admitting: Internal Medicine

## 2019-08-24 VITALS — BP 138/83 | HR 79 | Temp 98.1°F | Ht 61.0 in | Wt 165.5 lb

## 2019-08-24 DIAGNOSIS — R05 Cough: Secondary | ICD-10-CM | POA: Diagnosis not present

## 2019-08-24 DIAGNOSIS — R053 Chronic cough: Secondary | ICD-10-CM

## 2019-08-24 MED ORDER — BENZONATATE 100 MG PO CAPS: 100.0000 mg | ORAL_CAPSULE | Freq: Four times a day (QID) | ORAL | 0 refills | Status: DC | PRN

## 2019-08-24 MED ORDER — FLUTICASONE PROPIONATE 50 MCG/ACT NA SUSP: 1.0000 | Freq: Every day | NASAL | 0 refills | Status: DC

## 2019-08-24 MED ORDER — SALINE SPRAY 0.65 % NA SOLN: 1.0000 | NASAL | 0 refills | Status: DC | PRN

## 2019-08-24 MED ORDER — LORATADINE 10 MG PO CAPS: 10.0000 mg | ORAL_CAPSULE | Freq: Every day | ORAL | 0 refills | Status: DC

## 2019-08-24 NOTE — Progress Notes (Signed)
alab  CC: sore throat  HPI:  [name] is a 59 y.o. htn, chonic hep c who presents for evaluation of sore throat. Please see problem based charting for evaluation, assessment, and plan.  Past Medical History:  Diagnosis Date  . Bilateral arm pain    chronic  . Chronic hepatitis C ([place]) followed by dr comer at infectious disease   Genotype 1a--  currently on Harvoni started 09-13-2015  . Cirrhosis ([place])   . Dependence on crutches    left leg prosthesis  . Drug addiction in remission ([place])    since 2007  (crack cocaine,  IV drug use)  . External hemorrhoid, bleeding   . GERD (gastroesophageal reflux disease)   . Hand weakness   . History of esophageal stricture    post dilation x2  . History of osteosarcoma    1982  left knee---  s/p  AKA LLE--   per pt no recurrence  . History of recreational drug use multiple rehab visits   IV drug use for 1 yr and Smoked crack for 30+yrs--  per pt clean since 2007  . Hyperlipidemia   . Hypertension   . Idiopathic peripheral neuropathy   . Myocardial infarction ([place])   . Osteoarthritis   . osteosarcoma    age 23 surg only lt leg  . Prolapsed internal hemorrhoids, grade 4   . Shortness of breath   . Sigmoid diverticulosis    mild  . Sleep apnea    On CPAP  . Substance abuse ([place])    Clean for 14 years   Review of Systems:    Per hpi  Physical Exam:  Vitals:   08/24/19 1054  BP: 138/83  Pulse: 79  Temp: 98.1 F (36.7 C)  TempSrc: Oral  SpO2: 98%  Weight: 165 lb 8 oz (75.1 kg)  Height: 5\' 1"  (1.549 m)   Physical Exam  Constitutional: She appears well-developed and well-nourished. No distress.  HENT:  Head: Normocephalic and atraumatic.  Cardiovascular: Normal rate, regular rhythm and normal heart sounds.  Respiratory: Effort normal and breath sounds normal. No respiratory distress. She has no wheezes.  GI: Soft. Bowel sounds are normal. She exhibits no distension. There is no abdominal tenderness.    Musculoskeletal:        General: No edema.  Neurological: She is alert.  Skin: She is not diaphoretic.  Psychiatric: She has a normal mood and affect. Her behavior is normal. Judgment and thought content normal.   Assessment & Plan:   See Encounters Tab for problem based charting.  Patient discussed with Dr. Dareen Piano

## 2019-08-24 NOTE — Patient Instructions (Signed)
It was a pleasure to see you today Ms. Wyffels. Please make the following changes:  -you have been covid tested, please make sure to quarantine till your result comes back  -please use loratadine 10mg  daily, ocean nasal spray, tessalon perles for cough, and flonase  If you have any questions or concerns, please call our clinic at [phone] between 9am-5pm and after hours call [phone] and ask for the internal medicine resident on call. If you feel you are having a medical emergency please call 911.   Thank you, we look forward to help you remain healthy!  Lars Mage, MD Internal Medicine PGY3   To schedule an appointment for a COVID vaccine or be added to the vaccine wait list: Go to [email or web address]   OR Go to [email or web address]                  OR Call [phone]                                     OR Call [phone] and select Option 2  [place] ([place]) [address] [place], [place] [address] Hours: Mon,Thu 8-5, Sat 8-12 Type: [place] (12+)  [place]  ([place]) [place] [employer] [place] [address], [place] [address] Hours: Thu: 1-5 Type: Pfizer (12+)

## 2019-08-25 LAB — SARS-COV-2, NAA 2 DAY TAT

## 2019-08-25 LAB — NOVEL CORONAVIRUS, NAA: SARS-CoV-2, NAA: NOT DETECTED

## 2019-08-27 NOTE — Assessment & Plan Note (Signed)
Patient presents for evaluation of a sore throat that has been present for the past few weeks now.  She states that it is with accompanied productive cough, runny eyes, postnasal drip, sneezing.  Of note, the patient had similar complaints that were mentioned in June 2020. She was treated with ppi at that time.   Assessment and plan  Will treat the patient for allergic rhinitis with flonase, loratadine, nasal sinus rinses. Will order covid to rule out. Tessalon perles for symptomatic relief.

## 2019-08-27 NOTE — Progress Notes (Signed)
Internal Medicine Clinic Attending  Case discussed with Dr. Aslam at the time of the visit.  We reviewed the resident's history and exam and pertinent patient test results.  I agree with the assessment, diagnosis, and plan of care documented in the resident's note.  

## 2019-08-28 ENCOUNTER — Encounter: Payer: Self-pay | Admitting: Internal Medicine

## 2019-09-03 ENCOUNTER — Other Ambulatory Visit: Payer: Self-pay | Admitting: Internal Medicine

## 2019-09-03 DIAGNOSIS — I10 Essential (primary) hypertension: Secondary | ICD-10-CM

## 2019-09-07 ENCOUNTER — Other Ambulatory Visit: Payer: Self-pay

## 2019-09-07 MED ORDER — ATORVASTATIN CALCIUM 40 MG PO TABS: 40.0000 mg | ORAL_TABLET | Freq: Every day | ORAL | 11 refills | Status: DC

## 2019-09-07 NOTE — Telephone Encounter (Signed)
Requesting to speak with a nurse about pravastatin, please call pt back.

## 2019-09-07 NOTE — Telephone Encounter (Signed)
RTC, requesting refill on atorvastatin. SChaplin, RN,BSN

## 2019-09-08 ENCOUNTER — Other Ambulatory Visit: Payer: Self-pay

## 2019-09-08 ENCOUNTER — Telehealth: Payer: Self-pay | Admitting: *Deleted

## 2019-09-08 DIAGNOSIS — Z8619 Personal history of other infectious and parasitic diseases: Secondary | ICD-10-CM

## 2019-09-08 DIAGNOSIS — K7460 Unspecified cirrhosis of liver: Secondary | ICD-10-CM

## 2019-09-08 DIAGNOSIS — L659 Nonscarring hair loss, unspecified: Secondary | ICD-10-CM

## 2019-09-08 NOTE — Addendum Note (Signed)
Addended by: Larina Bras on: 09/08/2019 02:43 PM   Modules accepted: Orders

## 2019-09-08 NOTE — Telephone Encounter (Signed)
-----   Message from Roetta Sessions, [place] sent at 03/18/2019  4:41 PM EST ----- Regarding: labs Due for repeat labs in June 2021  CMET, CBC and INR

## 2019-09-08 NOTE — Telephone Encounter (Signed)
Patient indicates she will come for labs tomorrow.

## 2019-09-09 ENCOUNTER — Encounter: Payer: Self-pay | Admitting: Internal Medicine

## 2019-09-09 ENCOUNTER — Other Ambulatory Visit (INDEPENDENT_AMBULATORY_CARE_PROVIDER_SITE_OTHER): Payer: 59

## 2019-09-09 DIAGNOSIS — K7460 Unspecified cirrhosis of liver: Secondary | ICD-10-CM | POA: Diagnosis not present

## 2019-09-09 DIAGNOSIS — Z8619 Personal history of other infectious and parasitic diseases: Secondary | ICD-10-CM

## 2019-09-09 LAB — COMPREHENSIVE METABOLIC PANEL
ALT: 23 U/L (ref 0–35)
AST: 31 U/L (ref 0–37)
Albumin: 4.4 g/dL (ref 3.5–5.2)
Alkaline Phosphatase: 117 U/L (ref 39–117)
BUN: 16 mg/dL (ref 6–23)
CO2: 26 mEq/L (ref 19–32)
Calcium: 9.6 mg/dL (ref 8.4–10.5)
Chloride: 104 mEq/L (ref 96–112)
Creatinine, Ser: 1.36 mg/dL — ABNORMAL HIGH (ref 0.40–1.20)
GFR: 47.86 mL/min — ABNORMAL LOW (ref >60.00)
Glucose, Bld: 104 mg/dL — ABNORMAL HIGH (ref 70–99)
Potassium: 3.6 mEq/L (ref 3.5–5.1)
Sodium: 139 mEq/L (ref 135–145)
Total Bilirubin: 0.6 mg/dL (ref 0.2–1.2)
Total Protein: 7.9 g/dL (ref 6.0–8.3)

## 2019-09-09 LAB — CBC WITH DIFFERENTIAL/PLATELET
Basophils Absolute: 0 10*3/uL (ref 0.0–0.1)
Basophils Relative: 0.6 % (ref 0.0–3.0)
Eosinophils Absolute: 0.1 10*3/uL (ref 0.0–0.7)
Eosinophils Relative: 1.7 % (ref 0.0–5.0)
HCT: 40.7 % (ref 36.0–46.0)
Hemoglobin: 14.1 g/dL (ref 12.0–15.0)
Lymphocytes Relative: 52.3 % — ABNORMAL HIGH (ref 12.0–46.0)
Lymphs Abs: 3.3 10*3/uL (ref 0.7–4.0)
MCHC: 34.5 g/dL (ref 30.0–36.0)
MCV: 88.8 fl (ref 78.0–100.0)
Monocytes Absolute: 0.6 10*3/uL (ref 0.1–1.0)
Monocytes Relative: 10.2 % (ref 3.0–12.0)
Neutro Abs: 2.2 10*3/uL (ref 1.4–7.7)
Neutrophils Relative %: 35.2 % — ABNORMAL LOW (ref 43.0–77.0)
Platelets: 254 10*3/uL (ref 150.0–400.0)
RBC: 4.59 Mil/uL (ref 3.87–5.11)
RDW: 13.4 % (ref 11.5–15.5)
WBC: 6.3 10*3/uL (ref 4.0–10.5)

## 2019-09-09 LAB — PROTIME-INR
INR: 1.1 ratio — ABNORMAL HIGH (ref 0.8–1.0)
Prothrombin Time: 12.4 s (ref 9.6–13.1)

## 2019-09-10 ENCOUNTER — Ambulatory Visit: Payer: 59 | Admitting: Internal Medicine

## 2019-09-10 ENCOUNTER — Encounter: Payer: Self-pay | Admitting: Internal Medicine

## 2019-09-10 VITALS — BP 115/68 | HR 70 | Temp 98.2°F | Ht 61.0 in | Wt 160.6 lb

## 2019-09-10 DIAGNOSIS — R5383 Other fatigue: Secondary | ICD-10-CM | POA: Diagnosis not present

## 2019-09-10 DIAGNOSIS — I129 Hypertensive chronic kidney disease with stage 1 through stage 4 chronic kidney disease, or unspecified chronic kidney disease: Secondary | ICD-10-CM | POA: Diagnosis not present

## 2019-09-10 DIAGNOSIS — L659 Nonscarring hair loss, unspecified: Secondary | ICD-10-CM

## 2019-09-10 DIAGNOSIS — N1831 Chronic kidney disease, stage 3a: Secondary | ICD-10-CM | POA: Diagnosis not present

## 2019-09-10 DIAGNOSIS — I10 Essential (primary) hypertension: Secondary | ICD-10-CM

## 2019-09-10 LAB — POCT GLYCOSYLATED HEMOGLOBIN (HGB A1C): Hemoglobin A1C: 6.1 % — AB (ref 4.0–5.6)

## 2019-09-10 LAB — GLUCOSE, CAPILLARY: Glucose-Capillary: 87 mg/dL (ref 70–99)

## 2019-09-10 MED ORDER — LISINOPRIL-HYDROCHLOROTHIAZIDE 20-25 MG PO TABS: 1.0000 | ORAL_TABLET | Freq: Every day | ORAL | 5 refills | Status: DC

## 2019-09-10 NOTE — Progress Notes (Signed)
   CC: Abnormal labs, hair loss, fatigue  HPI:  [name] is a 59 y.o. with past medical history listed below presenting to follow-up her abnormal kidney labs was drawn by her GI physician.  She also endorses some generalized fatigue in her loss for the past 2 years.  Past Medical History:  Diagnosis Date  . Bilateral arm pain    chronic  . Chronic hepatitis C ([place]) followed by dr comer at infectious disease   Genotype 1a--  currently on Harvoni started 09-13-2015  . Cirrhosis ([place])   . Dependence on crutches    left leg prosthesis  . Drug addiction in remission ([place])    since 2007  (crack cocaine,  IV drug use)  . External hemorrhoid, bleeding   . GERD (gastroesophageal reflux disease)   . Hand weakness   . History of esophageal stricture    post dilation x2  . History of osteosarcoma    1982  left knee---  s/p  AKA LLE--   per pt no recurrence  . History of recreational drug use multiple rehab visits   IV drug use for 1 yr and Smoked crack for 30+yrs--  per pt clean since 2007  . Hyperlipidemia   . Hypertension   . Idiopathic peripheral neuropathy   . Myocardial infarction ([place])   . Osteoarthritis   . osteosarcoma    age 6 surg only lt leg  . Prolapsed internal hemorrhoids, grade 4   . Shortness of breath   . Sigmoid diverticulosis    mild  . Sleep apnea    On CPAP  . Substance abuse ([place])    Clean for 14 years   Review of Systems:   Constitutional: Negative for chills and fever.  Positive for generalized fatigue and hair loss. Respiratory: Negative for shortness of breath.   Cardiovascular: Negative for chest pain and leg swelling.  Gastrointestinal: Negative for abdominal pain, nausea and vomiting.  Neurological: Negative for dizziness and headaches.   Physical Exam:  Vitals:   09/10/19 0948  BP: 115/68  Pulse: 70  Temp: 98.2 F (36.8 C)  TempSrc: Oral  SpO2: 100%  Weight: 160 lb 9.6 oz (72.8 kg)  Height: 5\' 1"  (1.549 m)   Physical  Exam Constitutional:      Appearance: Normal appearance.  HENT:     Head: Normocephalic and atraumatic.     Comments: Thinning hair diffusely, negative pull test, no rash or redness noted, weave in place    Nose: Nose normal.     Mouth/Throat:     Mouth: Mucous membranes are dry.  Cardiovascular:     Rate and Rhythm: Normal rate and regular rhythm.  Pulmonary:     Effort: Pulmonary effort is normal.     Breath sounds: Normal breath sounds.  Musculoskeletal:     Cervical back: Normal range of motion and neck supple.  Neurological:     General: No focal deficit present.     Mental Status: She is alert and oriented to person, place, and time.  Psychiatric:        Mood and Affect: Mood normal.        Behavior: Behavior normal.      Assessment & Plan:   See Encounters Tab for problem based charting.  Patient discussed with Dr. Evette Doffing

## 2019-09-10 NOTE — Patient Instructions (Signed)
Ms. Stacy Thomas,  It was a pleasure to see you today. Thank you for coming in.   Today we discussed your kidney function. We are checking some labs to find out why you are having this. I have switched your blood pressure medication to the lisinopril-hydrochlorathiazide medication which can help protect your kidneys. We will follow up in 1 month.    We also discussed your hair loss. I am checking some labs and will contact you with the results.   Please return to clinic in 1 month or sooner if needed.   Thank you again for coming in.   [name].

## 2019-09-11 ENCOUNTER — Encounter: Payer: Self-pay | Admitting: Internal Medicine

## 2019-09-11 DIAGNOSIS — N189 Chronic kidney disease, unspecified: Secondary | ICD-10-CM | POA: Insufficient documentation

## 2019-09-11 LAB — PROTEIN ELECTROPHORESIS, URINE REFLEX
Albumin ELP, Urine: 44.7 %
Alpha-1-Globulin, U: 5.1 %
Alpha-2-Globulin, U: 10.1 %
Beta Globulin, U: 21 %
Gamma Globulin, U: 19 %
Protein, Ur: 6.5 mg/dL

## 2019-09-11 LAB — MICROALBUMIN / CREATININE URINE RATIO
Creatinine, Urine: 125.7 mg/dL
Microalb/Creat Ratio: 5 mg/g creat (ref 0–29)
Microalbumin, Urine: 6.4 ug/mL

## 2019-09-11 NOTE — Assessment & Plan Note (Signed)
She reports that she has been eating and drinking okay denies any issues with this, denies any feelings of dehydration.  She does report decreased urine output for the past 4 years, reports difficulty with emptying her bladder in some straining, denies any worsening of the symptoms.  She denies dysuria or hematuria, shortness of breath, or chest pain.  She does endorse some generalized fatigue and hearing loss.  Her most recent creatinine was 1.3, trended up from 1.07, 1.21, 1.03, 1.2, 1.27.  GFR is 44.  She does meet criteria for CKD stage IIIa.  She has well-controlled hypertension, is on triamterene-HCTZ at this time.  No history of diabetes.  Unclear etiology for her worsening kidney function.  Will do further work-up with labs and imaging to further evaluate.  -Check renal ultrasound -A1c, urine protein electrophoresis and IFE -urine microalbumin/creatinine ratio -Switch triamterene-HCTZ to lisinopril-HCTZ with better renal protection -Return to clinic in 1 month

## 2019-09-11 NOTE — Assessment & Plan Note (Signed)
Patient reports that she has been having generalized fatigue and hair loss for the past 2 years.  She reports feeling tired all the time and is not sure if it is worsening.  She also endorses hair loss on the frontal aspect of her head. Also endorses some dry skin and feeling very hot.  Denies any changes in weight or changes in appetite.  On exam patient does have diffuse thinning of her hair, pull test was negative, no rash or abnormal discoloration noted no evidence of virilization on exam.  Patient being evaluated for her new-onset CKD, will check a TSH.  Her loss could be due to female pattern hair loss.  -TSH

## 2019-09-14 ENCOUNTER — Encounter: Payer: Self-pay | Admitting: Internal Medicine

## 2019-09-14 DIAGNOSIS — S78119A Complete traumatic amputation at level between unspecified hip and knee, initial encounter: Secondary | ICD-10-CM

## 2019-09-14 LAB — IMMUNOFIXATION ELECTROPHORESIS
IgA/Immunoglobulin A, Serum: 194 mg/dL (ref 87–352)
IgG (Immunoglobin G), Serum: 1390 mg/dL (ref 586–1602)
IgM (Immunoglobulin M), Srm: 84 mg/dL (ref 26–217)
Total Protein: 7.4 g/dL (ref 6.0–8.5)

## 2019-09-14 LAB — TSH: TSH: 2.56 u[IU]/mL (ref 0.450–4.500)

## 2019-09-14 NOTE — Progress Notes (Signed)
Internal Medicine Clinic Attending  Case discussed with Dr. Krienke at the time of the visit.  We reviewed the resident's history and exam and pertinent patient test results.  I agree with the assessment, diagnosis, and plan of care documented in the resident's note.    

## 2019-09-18 ENCOUNTER — Ambulatory Visit (HOSPITAL_COMMUNITY)
Admission: RE | Admit: 2019-09-18 | Discharge: 2019-09-18 | Disposition: A | Discharge status: Home / Self Care | Payer: 59 | Source: Ambulatory Visit | Attending: Student in an Organized Health Care Education/Training Program | Admitting: Student in an Organized Health Care Education/Training Program

## 2019-09-18 ENCOUNTER — Other Ambulatory Visit: Payer: Self-pay

## 2019-09-18 DIAGNOSIS — N1831 Chronic kidney disease, stage 3a: Secondary | ICD-10-CM | POA: Diagnosis not present

## 2019-09-28 ENCOUNTER — Ambulatory Visit: Payer: 59 | Admitting: Internal Medicine

## 2019-09-28 ENCOUNTER — Encounter: Payer: Self-pay | Admitting: Internal Medicine

## 2019-09-28 ENCOUNTER — Telehealth: Payer: Self-pay | Admitting: *Deleted

## 2019-09-28 VITALS — BP 109/74 | HR 77 | Ht 61.0 in | Wt 160.8 lb

## 2019-09-28 DIAGNOSIS — N1831 Chronic kidney disease, stage 3a: Secondary | ICD-10-CM | POA: Diagnosis not present

## 2019-09-28 DIAGNOSIS — Z89612 Acquired absence of left leg above knee: Secondary | ICD-10-CM | POA: Diagnosis not present

## 2019-09-28 DIAGNOSIS — I10 Essential (primary) hypertension: Secondary | ICD-10-CM

## 2019-09-28 NOTE — Patient Instructions (Addendum)
Ms. Stacy Thomas,  It was a pleasure to see you today. Thank you for coming in.   Today we discussed your kidney function. In regards to this, all your labs have been unremarkable. We will repeat some labs today and will call you with the results.   We also discussed your elevated A1c. Please work on increasing your exercise regimen. Please decrease your carb intake.    Please return to clinic in 3 months or sooner if needed.   Thank you again for coming in.   [name].

## 2019-09-28 NOTE — Telephone Encounter (Signed)
Order for left prosthetic sleeve, along with note and demographics faxed to [facility] [place] at [phone]

## 2019-09-28 NOTE — Progress Notes (Signed)
   CC: Follow up of evaluation of abnormal kidney labs and left leg prosthetic  HPI:  [name] is a 59 y.o. with the history listed below presenting for follow-up of her hypertension, abnormal kidney labs, and left lower extremity prosthetic.  Past Medical History:  Diagnosis Date  . Bilateral arm pain    chronic  . Chronic hepatitis C ([place]) followed by dr comer at infectious disease   Genotype 1a--  currently on Harvoni started 09-13-2015  . Cirrhosis ([place])   . Dependence on crutches    left leg prosthesis  . Drug addiction in remission ([place])    since 2007  (crack cocaine,  IV drug use)  . External hemorrhoid, bleeding   . GERD (gastroesophageal reflux disease)   . Hand weakness   . History of esophageal stricture    post dilation x2  . History of osteosarcoma    1982  left knee---  s/p  AKA LLE--   per pt no recurrence  . History of recreational drug use multiple rehab visits   IV drug use for 1 yr and Smoked crack for 30+yrs--  per pt clean since 2007  . Hyperlipidemia   . Hypertension   . Idiopathic peripheral neuropathy   . Myocardial infarction ([place])   . Osteoarthritis   . osteosarcoma    age 63 surg only lt leg  . Prolapsed internal hemorrhoids, grade 4   . Shortness of breath   . Sigmoid diverticulosis    mild  . Sleep apnea    On CPAP  . Substance abuse ([place])    Clean for 14 years   Review of Systems:   Constitutional: Negative for chills and fever.  Respiratory: Negative for shortness of breath.   Cardiovascular: Negative for chest pain and leg swelling.  Gastrointestinal: Negative for abdominal pain, nausea and vomiting.  Neurological: Negative for dizziness and headaches.   Physical Exam:  Vitals:   09/28/19 1506  BP: 109/74  Pulse: 77  SpO2: 98%  Weight: 160 lb 12.8 oz (72.9 kg)  Height: 5\' 1"  (1.549 m)   Physical Exam Constitutional:      Appearance: Normal appearance.  HENT:     Head: Normocephalic and atraumatic.    Cardiovascular:     Pulses: Normal pulses.     Heart sounds: Normal heart sounds.  Pulmonary:     Effort: Pulmonary effort is normal.     Breath sounds: Normal breath sounds.  Abdominal:     General: Abdomen is flat. Bowel sounds are normal.     Palpations: Abdomen is soft.  Musculoskeletal:        General: Normal range of motion.     Comments: Left leg AKA, prosthetic in place  Skin:    General: Skin is warm and dry.     Capillary Refill: Capillary refill takes less than 2 seconds.  Neurological:     General: No focal deficit present.     Mental Status: She is alert and oriented to person, place, and time.  Psychiatric:        Mood and Affect: Mood normal.        Behavior: Behavior normal.     Assessment & Plan:   See Encounters Tab for problem based charting.  Patient discussed with Dr. Rebeca Alert

## 2019-09-29 ENCOUNTER — Encounter: Payer: Self-pay | Admitting: Internal Medicine

## 2019-09-29 LAB — CMP14 + ANION GAP
ALT: 23 IU/L (ref 0–32)
AST: 28 IU/L (ref 0–40)
Albumin/Globulin Ratio: 1.4 (ref 1.2–2.2)
Albumin: 4.1 g/dL (ref 3.8–4.9)
Alkaline Phosphatase: 119 IU/L (ref 48–121)
Anion Gap: 12 mmol/L (ref 10.0–18.0)
BUN/Creatinine Ratio: 11 — ABNORMAL LOW (ref 12–28)
BUN: 14 mg/dL (ref 8–27)
Bilirubin Total: 0.8 mg/dL (ref 0.0–1.2)
CO2: 24 mmol/L (ref 20–29)
Calcium: 9.4 mg/dL (ref 8.7–10.3)
Chloride: 105 mmol/L (ref 96–106)
Creatinine, Ser: 1.24 mg/dL — ABNORMAL HIGH (ref 0.57–1.00)
GFR calc Af Amer: 55 mL/min/{1.73_m2} — ABNORMAL LOW (ref >59)
GFR calc non Af Amer: 47 mL/min/{1.73_m2} — ABNORMAL LOW (ref >59)
Globulin, Total: 2.9 g/dL (ref 1.5–4.5)
Glucose: 84 mg/dL (ref 65–99)
Potassium: 3.5 mmol/L (ref 3.5–5.2)
Sodium: 141 mmol/L (ref 134–144)
Total Protein: 7 g/dL (ref 6.0–8.5)

## 2019-09-29 NOTE — Assessment & Plan Note (Signed)
Patient has frequent lab checks at her gastroenterology office.  Labs drawn on 09/09/19 showed an increased Cr level of 1.3, trending around 1.07, 1.21, 1.03, 1.2, 1.27.  GFR is 44.  She was seen on 09/10/19 and had labs drawn to evaluate cause of her symptoms. Renal ultrasound was normal. A1c mildly elevated at 6.1. Urine protein EP and IFE normal. She was switched from triamterene-hctz to lisinopril-hctz for better renal protection. Evaluate thus far has been unremarkable. Unclear cause of her CKD. Could be 2/2 her HTN. Will continue to monitor for now.   -Repeat BMP today -Continue lisinopril-HCTZ daily

## 2019-09-29 NOTE — Assessment & Plan Note (Signed)
Patient is currently on Zestoretic 20-25 mg daily, this was changed on her last visit for renal protection.  She denies any issues taking her medications.  Denies any lightheadedness, dizziness, headaches, fevers, fatigue, weakness, blood pressure today is well controlled at 100/74. No changes at this time.

## 2019-09-29 NOTE — Assessment & Plan Note (Signed)
Patient has a left lower extremity prosthetic, she reports that the covering for the leg is getting old and has been falling off. She last had it replaced about 4-5 years ago. She was wanting a new prosthetic sleeve that she can attach to her AKA. This seems reasonable. Have already placed a DME order for a new prosthetic sleeve.

## 2019-09-30 NOTE — Progress Notes (Signed)
Internal Medicine Clinic Attending  Case discussed with Dr. Krienke at the time of the visit.  We reviewed the resident's history and exam and pertinent patient test results.  I agree with the assessment, diagnosis, and plan of care documented in the resident's note.  Gusta Marksberry, M.D., Ph.D.  

## 2019-10-08 ENCOUNTER — Encounter: Payer: Self-pay | Admitting: Gastroenterology

## 2019-10-08 ENCOUNTER — Ambulatory Visit: Payer: 59 | Admitting: Gastroenterology

## 2019-10-08 VITALS — BP 90/60 | HR 72 | Ht 61.0 in | Wt 158.4 lb

## 2019-10-08 DIAGNOSIS — K862 Cyst of pancreas: Secondary | ICD-10-CM | POA: Diagnosis not present

## 2019-10-08 DIAGNOSIS — K7460 Unspecified cirrhosis of liver: Secondary | ICD-10-CM

## 2019-10-08 DIAGNOSIS — Z860100 Personal history of colon polyps, unspecified: Secondary | ICD-10-CM

## 2019-10-08 DIAGNOSIS — I959 Hypotension, unspecified: Secondary | ICD-10-CM

## 2019-10-08 DIAGNOSIS — R5383 Other fatigue: Secondary | ICD-10-CM | POA: Diagnosis not present

## 2019-10-08 NOTE — Patient Instructions (Addendum)
If you are age 61 or older, your body mass index should be between 23-30. Your Body mass index is 29.93 kg/m. If this is out of the aforementioned range listed, please consider follow up with your Primary Care Provider.  If you are age 15 or younger, your body mass index should be between 19-25. Your Body mass index is 29.93 kg/m. If this is out of the aformentioned range listed, please consider follow up with your Primary Care Provider.   You have been scheduled for an endoscopy. Please follow written instructions given to you at your visit today. If you use inhalers (even only as needed), please bring them with you on the day of your procedure.  Please hold your Blood pressure medicine today and recheck it tonight.  You will be due for an ultrasound in November and labs in December.  You will be due for an MRCP in May of 2022.  You will also be due for a colonoscopy in December of 2021.  We will remind you when it is time to schedule.  Thank you for entrusting me with your care and for choosing [facility], Dr. Vilas Cellar

## 2019-10-08 NOTE — Progress Notes (Signed)
HPI :  59 y/o female with history of hep C related cirrhosis, pancreatic cysts, here for a follow up visit.  She has a history of hep C diagnosed in recent years, thought to be due to IV drugs / smoked crack cocaine. Genotype 1a, treated with Harvoni for 12 weeks a few years ago with eradication.  [place] with elastography prior to therapy showing normal liver and spleen, with some F3/F4 fibrotic changes. Imaging since then has showed changes concerning for cirrhosis. She has not had any decompensations to date.  No jaundice, no ascites, no hepatic encephalopathy, her last EGD was in 2018 and she had no evidence of varices at that time.  She is due for variceal screening.  She had H. pylori gastritis on her last exam which was eradicated with Pylera.  She is not drinking any alcohol.  She has been followed for benign-appearing pancreatic cyst, had a follow-up MRCP in May which showed stable changes in her pancreas.  No weight loss.  She endorses feeling a bit lightheaded lately, she has had blood pressure medication, lisinopril/hydrochlorothiazide added to her regimen, BP 90/60 today.  Previously on this regimen in recent weeks her BP has been in the low 100s.  She also endorses ongoing fatigue which bothers her.  She does have a history of CPAP but does not wear it given it makes her throat dry, she states she cannot afford to follow-up with a sleep medicine physician.  She also has problems with hair loss, questions what is causing this, she started a biotin supplement which has not helped, I have referred her to dermatology, she cannot afford to go there right now.   She otherwise had a screening colonoscopy at her last visit with me, she had a few small adenomatous polyps removed.  She also had a subepithelial lesion in the ascending colon for which biopsies were nondiagnostic.  She had a CT scan to follow this up which did not show any obvious subepithelial lesion.  She denies any problems with her  bowels currently.   Prior workup: EGD 09/10/2016 - 3cm HH, irregular zline, no varices, H pylori gastritis, given Pylera for gastritis -   MRCP 02/11/2017 - changes of cirrhosis noted, stable 1.5cm cystic pancreatic lesion uncinate process without high risk features, subcm LI-RADS category 3 lesion in the left lobe - recommended repeat MRI in 6 months  MRI 08/23/2017 - small left lobe lesion less conspicous, stable cystic lesion in the uncinate process - repeat MRI in 1 year  RUQ [place] - 02/25/2018 - 42mm left lobe lesion stable, suspected hemangioma  MRCP 08/22/18 - stable cystic lesion of the pancreas - measuring 11 x 63mm, no change from 02/06/2016 - radiology recommending surveillance in 1 year, mild changes of cirrhosis  [place] 06/27/16 - "mild cirrhosis", no mass lesions  CT abdomen 02/06/2016 - subtle changes of cirrhosis, indeterminate lesion in pancreas 1.5cm thought to represent cyst.  Colonoscopy 03/23/19 - The perianal and digital rectal examinations were normal. - A diminutive polyp was found in the distal ascending colon. The polyp was sessile. The polyp was removed with a cold biopsy forceps. Resection and retrieval were complete. - Two sessile polyps were found in the distal ascending colon. The polyps were 4 to 6 mm in size. These polyps were removed with a cold snare. Resection and retrieval were complete. - One roughly 5 mm subepithelial nodule was found in the distal ascending colon. It was hard to palpation with the forceps. Multiple bite  on biopsies were taken with a cold forceps for histology. Area just distal to the lesion was tattooed with an injection of Spot (carbon black). - A diminutive polyp was found in the transverse colon. The polyp was sessile. The polyp was removed with a cold snare. Resection and retrieval were complete. - Internal hemorrhoids were found during retroflexion. - There was evidence of scarring in the rectum from prior hemorrhoidectomy. The colon  was tortous. The exam was otherwise without abnormality.  Diagnosis 1. Surgical [P], colon, ascending, polyp (3) - TUBULAR ADENOMA(S). - NO HIGH GRADE DYSPLASIA OR MALIGNANCY. 2. Surgical [P], colon, ascending BX - BENIGN COLONIC MUCOSA. - NO INFLAMMATORY CHANGES, ADENOMATOUS CHANGE OR MALIGNANCY.   CT scan 04/10/19 - IMPRESSION: 1. No CT abnormality of the colon. 2. Hepatic steatosis. 3. Aortic Atherosclerosis (ICD10-I70.0). Focal ectasia of the terminal aorta measuring up to 2.1 cm, unchanged compared to prior examination. 4. Coronary artery disease.  MRCP - 08/17/19 - IMPRESSION: 1. Minimal motion degradation. 2. Similar 1.2 cm cystic lesion within the pancreatic uncinate process. Most likely a pseudocyst. Per consensus criteria, this warrants surveillance by annual MRI/MRCP for a total of 5 years. This recommendation follows ACR consensus guidelines: Management of Incidental Pancreatic Cysts: A White Paper of the ACR Incidental Findings Committee. J Am Coll Radiol 6629;47:654-650. 3. Mild cirrhosis. 4.  Aortic Atherosclerosis (ICD10-I70.0).    Past Medical History:  Diagnosis Date  . Bilateral arm pain    chronic  . Chronic hepatitis C ([place]) followed by dr comer at infectious disease   Genotype 1a--  currently on Harvoni started 09-13-2015  . Cirrhosis ([place])   . Dependence on crutches    left leg prosthesis  . Drug addiction in remission ([place])    since 2007  (crack cocaine,  IV drug use)  . External hemorrhoid, bleeding   . GERD (gastroesophageal reflux disease)   . Hand weakness   . History of esophageal stricture    post dilation x2  . History of osteosarcoma    1982  left knee---  s/p  AKA LLE--   per pt no recurrence  . History of recreational drug use multiple rehab visits   IV drug use for 1 yr and Smoked crack for 30+yrs--  per pt clean since 2007  . Hyperlipidemia   . Hypertension   . Idiopathic peripheral neuropathy   . Myocardial infarction ([place])     . Osteoarthritis   . osteosarcoma    age 1 surg only lt leg  . Prolapsed internal hemorrhoids, grade 4   . Shortness of breath   . Sigmoid diverticulosis    mild  . Sleep apnea    On CPAP  . Substance abuse ([place])    Clean for 14 years     Past Surgical History:  Procedure Laterality Date  . ABOVE KNEE LEG AMPUTATION Left 1982   osteosarcoma  . COLONOSCOPY  last one 02-28-2009  . ESOPHAGOGASTRODUODENOSCOPY (EGD) WITH ESOPHAGEAL DILATION  x2  last one 04-28-2008  . EXCISION BREAST BX  2015   CYST  . TONSILLECTOMY  as child  . TRANSANAL HEMORRHOIDAL DEARTERIALIZATION N/A 10/27/2015   Procedure: TRANSANAL HEMORRHOIDAL DEARTERIALIZATION;  Surgeon: Michael Boston, MD;  Location: [facility];  Service: General;  Laterality: N/A;   Family History  Problem Relation Age of Onset  . COPD Father   . Alcohol abuse Father        Deceased  . Hypertension Mother        Living  .  Healthy Daughter   . Healthy Son   . Anesthesia problems Neg Hx   . Breast cancer Neg Hx   . Colon cancer Neg Hx   . Esophageal cancer Neg Hx   . Rectal cancer Neg Hx   . Stomach cancer Neg Hx    Social History   Tobacco Use  . Smoking status: Former Smoker    Packs/day: 1.00    Years: 37.00    Pack years: 37.00    Types: Cigarettes    Start date: 04/02/1973    Quit date: 12/19/2014    Years since quitting: 4.8  . Smokeless tobacco: Never Used  Vaping Use  . Vaping Use: Never used  Substance Use Topics  . Alcohol use: No    Alcohol/week: 0.0 standard drinks  . Drug use: No    Types: "Crack" cocaine    Comment: recovery addict since 2007   Current Outpatient Medications  Medication Sig Dispense Refill  . atorvastatin (LIPITOR) 40 MG tablet Take 1 tablet (40 mg total) by mouth daily. 30 tablet 11  . benzonatate (TESSALON PERLES) 100 MG capsule Take 1 capsule (100 mg total) by mouth every 6 (six) hours as needed for cough. 30 capsule 0  . diclofenac sodium (VOLTAREN) 1 % GEL Apply 4  g topically 4 (four) times daily. 350 g 2  . fluticasone (FLONASE) 50 MCG/ACT nasal spray Place 1 spray into both nostrils daily. 15 mL 0  . lisinopril-hydrochlorothiazide (ZESTORETIC) 20-25 MG tablet Take 1 tablet by mouth daily. 30 tablet 5  . Loratadine 10 MG CAPS Take 1 capsule (10 mg total) by mouth daily. 30 capsule 0  . sodium chloride (OCEAN) 0.65 % SOLN nasal spray Place 1 spray into both nostrils as needed for congestion. 44 mL 0   No current facility-administered medications for this visit.   Allergies  Allergen Reactions  . Morphine Swelling    REACTION: face swells  . Codeine Other (See Comments)    Avoids due to being recovery addict     Review of Systems: All systems reviewed and negative except where noted in HPI.    US Renal  Result Date: 09/18/2019 CLINICAL DATA:  Chronic kidney disease stage 3. EXAM: RENAL / URINARY TRACT ULTRASOUND COMPLETE COMPARISON:  None. FINDINGS: Right Kidney: Renal measurements: 9.9 x 4.5 x 4.6 cm = volume: 108 mL. Mild lobulated contour. Echogenicity within normal limits. No mass or hydronephrosis visualized. Left Kidney: Renal measurements: 9.9 x 4.6 x 5.1 cm = volume: 121 mL. Mild lobulated contour. Echogenicity within normal limits. No mass or hydronephrosis visualized. Bladder: Appears normal for degree of bladder distention. Bilateral ureteral jets visualized. Other: None. IMPRESSION: Normal size kidneys without hydronephrosis. Electronically Signed   By: Marin Olp M.D.   On: 09/18/2019 19:27   Lab Results  Component Value Date   WBC 6.3 09/09/2019   HGB 14.1 09/09/2019   HCT 40.7 09/09/2019   MCV 88.8 09/09/2019   PLT 254.0 09/09/2019    Lab Results  Component Value Date   CREATININE 1.24 (H) 09/28/2019   BUN 14 09/28/2019   NA 141 09/28/2019   K 3.5 09/28/2019   CL 105 09/28/2019   CO2 24 09/28/2019    Lab Results  Component Value Date   ALT 23 09/28/2019   AST 28 09/28/2019   ALKPHOS 119 09/28/2019   BILITOT 0.8  09/28/2019      Physical Exam: BP 90/60   Pulse 72   Ht 5\' 1"  (1.549 m)  Wt 158 lb 6.4 oz (71.8 kg)   LMP 01/22/2011   BMI 29.93 kg/m  Constitutional: Pleasant,well-developed, female in no acute distress. HEENT: Normocephalic and atraumatic. Conjunctivae are normal. No scleral icterus. Neck supple.  Abdominal: Soft, nondistended, nontender.  There are no masses palpable.  Extremities: no edema, left leg prosthesis Lymphadenopathy: No cervical adenopathy noted. Neurological: Alert and oriented to person place and time. Skin: Skin is warm and dry. No rashes noted. Psychiatric: Normal mood and affect. Behavior is normal.   ASSESSMENT AND PLAN: 59 y/o female here for reassessment of the following:  Cirrhosis / history of hep C - compensated to date, Hep C eradicated previously. She is doing well in this regard. MRCP as above, no concerning lesions, no ascites. No alcohol use. We discussed her cirrhosis, risks for decompensation moving forward. She has not had varices in the past but is due for a screening EGD to evaluate for this. Discussed risks / benefits of EGD and anesthesia and she wished to proceed with it. Otherwise next due for [facility] screening in November with [place], and due for basic labs in December. She will continue to follow up every 6 months for this issue.  Pancreatic cyst - this is stable and benign appearing dating back to 2017. Does not meet criteria for EUS. Would repeat another MRCP in 1-2 years for continued surveillance.   History of colon polyps -a few small adenomas on the last exam and then a benign subepithelial lesion for which bite on bite biopsies were nondiagnostic.  We will plan on another colonoscopy 1 year from her last exam, this due in December, to survey her subepithelial lesion.  Fatigue / low BP today - her BP is on the low side in clinic today, I think her blood pressure medicine could be causing some lightheadedness and potentially contributing to  fatigue, recommend she touch base with her primary care about this and that she should keep a BP log at home to clarify if this is a persistent issue or not.  She also has been noncompliant with her CPAP, and recommend she discuss with her sleep physician had a better fit this and tolerate as it could be contributing to her fatigue.  She agreed  [place] Cellar, MD [facility]

## 2019-10-16 ENCOUNTER — Ambulatory Visit: Payer: 59 | Admitting: Physician Assistant

## 2019-11-02 ENCOUNTER — Other Ambulatory Visit: Payer: Self-pay

## 2019-11-02 ENCOUNTER — Ambulatory Visit (AMBULATORY_SURGERY_CENTER): Payer: 59 | Admitting: Gastroenterology

## 2019-11-02 ENCOUNTER — Encounter: Payer: Self-pay | Admitting: Gastroenterology

## 2019-11-02 VITALS — BP 109/78 | HR 89 | Temp 97.1°F | Resp 18 | Ht 61.0 in | Wt 158.0 lb

## 2019-11-02 DIAGNOSIS — K449 Diaphragmatic hernia without obstruction or gangrene: Secondary | ICD-10-CM | POA: Diagnosis not present

## 2019-11-02 DIAGNOSIS — K7469 Other cirrhosis of liver: Secondary | ICD-10-CM | POA: Diagnosis present

## 2019-11-02 MED ORDER — SODIUM CHLORIDE 0.9 % IV SOLN
500.0000 mL | INTRAVENOUS | Status: DC
Start: 2019-11-02 — End: 2019-11-02

## 2019-11-02 NOTE — Progress Notes (Signed)
Pt's states no medical or surgical changes since previsit or office visit. 

## 2019-11-02 NOTE — Progress Notes (Signed)
o2 sat decease at start o2 10 l w ambu increased sat

## 2019-11-02 NOTE — Op Note (Signed)
[place] Patient Name: Stacy Thomas Procedure Date: 11/02/2019 7:53 AM MRN: [id number] Endoscopist: Remo Lipps P. Havery Moros , MD Age: 59 Referring MD:  Date of Birth: Jul 11, 1960 Gender: Female Account #: [id number] Procedure:                Upper GI endoscopy Indications:              Cirrhosis - screening for esophageal varices Medicines:                Monitored Anesthesia Care Procedure:                Pre-Anesthesia Assessment:                           - Prior to the procedure, a History and Physical                            was performed, and patient medications and                            allergies were reviewed. The patient's tolerance of                            previous anesthesia was also reviewed. The risks                            and benefits of the procedure and the sedation                            options and risks were discussed with the patient.                            All questions were answered, and informed consent                            was obtained. Prior Anticoagulants: The patient has                            taken no previous anticoagulant or antiplatelet                            agents. ASA Grade Assessment: III - A patient with                            severe systemic disease. After reviewing the risks                            and benefits, the patient was deemed in                            satisfactory condition to undergo the procedure.                           After obtaining informed consent, the endoscope was  passed under direct vision. Throughout the                            procedure, the patient's blood pressure, pulse, and                            oxygen saturations were monitored continuously. The                            Endoscope was introduced through the mouth, and                            advanced to the second part of duodenum. The upper                            GI  endoscopy was accomplished without difficulty.                            The patient tolerated the procedure well. Scope In: Scope Out: Findings:                 Esophagogastric landmarks were identified: the                            Z-line was found at 35 cm, the gastroesophageal                            junction was found at 35 cm and the upper extent of                            the gastric folds was found at 37 cm from the                            incisors.                           A 2 cm hiatal hernia was present.                           The Z-line was irregular with a few diminutive                            islands of salmon colored mucosa, did not meet                            criteria for Barrett's biopsies..                           A single small area of benign ectopic gastric                            mucosa was found in the upper third of the  esophagus.                           The exam of the esophagus was otherwise normal. No                            esophageal varices.                           The entire examined stomach was normal. No gastric                            varices                           The duodenal bulb and second portion of the                            duodenum were normal. Complications:            No immediate complications. Estimated blood loss:                            None. Estimated Blood Loss:     Estimated blood loss: none. Impression:               - Esophagogastric landmarks identified.                           - 2 cm hiatal hernia.                           - Z-line irregular, benign, did not meet criteria                            for Barrett's biopsies.                           - Ectopic gastric mucosa in the upper third of the                            esophagus.                           - No esophageal varices                           - Normal stomach. No gastric varices.                            - Normal duodenal bulb and second portion of the                            duodenum. Recommendation:           - Patient has a contact number available for                            emergencies. The  signs and symptoms of potential                            delayed complications were discussed with the                            patient. Return to normal activities tomorrow.                            Written discharge instructions were provided to the                            patient.                           - Resume previous diet.                           - Continue present medications.                           - Repeat upper endoscopy in 3 years for screening                            purposes. Remo Lipps P. Havery Moros, MD 11/02/2019 8:21:38 AM This report has been signed electronically.

## 2019-11-02 NOTE — Progress Notes (Signed)
A and O x3. Report to RN. Tolerated MAC anesthesia well.Teeth unchanged after procedure.

## 2019-11-02 NOTE — Progress Notes (Signed)
robinol antisialogogue  Lidocaine   buffer 

## 2019-11-02 NOTE — Patient Instructions (Signed)
Please read handouts provided. Continue present medications.      YOU HAD AN ENDOSCOPIC PROCEDURE TODAY AT THE [facility]:   Refer to the procedure report that was given to you for any specific questions about what was found during the examination.  If the procedure report does not answer your questions, please call your gastroenterologist to clarify.  If you requested that your care partner not be given the details of your procedure findings, then the procedure report has been included in a sealed envelope for you to review at your convenience later.  YOU SHOULD EXPECT: Some feelings of bloating in the abdomen. Passage of more gas than usual.  Walking can help get rid of the air that was put into your GI tract during the procedure and reduce the bloating. If you had a lower endoscopy (such as a colonoscopy or flexible sigmoidoscopy) you may notice spotting of blood in your stool or on the toilet paper. If you underwent a bowel prep for your procedure, you may not have a normal bowel movement for a few days.  Please Note:  You might notice some irritation and congestion in your nose or some drainage.  This is from the oxygen used during your procedure.  There is no need for concern and it should clear up in a day or so.  SYMPTOMS TO REPORT IMMEDIATELY:    Following upper endoscopy (EGD)  Vomiting of blood or coffee ground material  New chest pain or pain under the shoulder blades  Painful or persistently difficult swallowing  New shortness of breath  Fever of 100F or higher  Black, tarry-looking stools  For urgent or emergent issues, a gastroenterologist can be reached at any hour by calling [phone]. Do not use MyChart messaging for urgent concerns.    DIET:  We do recommend a small meal at first, but then you may proceed to your regular diet.  Drink plenty of fluids but you should avoid alcoholic beverages for 24 hours.  ACTIVITY:  You should plan to take it easy  for the rest of today and you should NOT DRIVE or use heavy machinery until tomorrow (because of the sedation medicines used during the test).    FOLLOW UP: Our staff will call the number listed on your records 48-72 hours following your procedure to check on you and address any questions or concerns that you may have regarding the information given to you following your procedure. If we do not reach you, we will leave a message.  We will attempt to reach you two times.  During this call, we will ask if you have developed any symptoms of COVID 19. If you develop any symptoms (ie: fever, flu-like symptoms, shortness of breath, cough etc.) before then, please call [phone].  If you test positive for Covid 19 in the 2 weeks post procedure, please call and report this information to us.    If any biopsies were taken you will be contacted by phone or by letter within the next 1-3 weeks.  Please call us at [phone] if you have not heard about the biopsies in 3 weeks.    SIGNATURES/CONFIDENTIALITY: You and/or your care partner have signed paperwork which will be entered into your electronic medical record.  These signatures attest to the fact that that the information above on your After Visit Summary has been reviewed and is understood.  Full responsibility of the confidentiality of this discharge information lies with you and/or your care-partner. 

## 2019-11-04 ENCOUNTER — Telehealth: Payer: Self-pay

## 2019-11-04 NOTE — Telephone Encounter (Signed)
  Follow up Call-  Call back number 11/02/2019 03/23/2019  Post procedure Call Back phone  # [phone]  Permission to leave phone message Yes Yes  Some recent data might be hidden     Patient questions:  Do you have a fever, pain , or abdominal swelling? No. Pain Score  0 *  Have you tolerated food without any problems? Yes.    Have you been able to return to your normal activities? Yes.    Do you have any questions about your discharge instructions: Diet   No. Medications  No. Follow up visit  No.  Do you have questions or concerns about your Care? No.  Actions: * If pain score is 4 or above: No action needed, pain <4.   1. Have you developed a fever since your procedure? No   2.   Have you had an respiratory symptoms (SOB or cough) since your procedure? No   3.   Have you tested positive for COVID 19 since your procedure? No   4.   Have you had any family members/close contacts diagnosed with the COVID 19 since your procedure?  No    If yes to any of these questions please route to Joylene John, RN and Erenest Rasher, RN

## 2019-12-23 NOTE — Telephone Encounter (Signed)
Returned call to [place] at [place]. States order for sleeve was written on 09/15/2019 and OV note is from 09/28/2019. [facility] requires the order to be written same day or after in order to cover. Requesting order be rewritten. Dr. Heber [place] was Attending on 09/28/2019. Will ask him to rewrite order. Hubbard Hartshorn, BSN, RN-BC

## 2019-12-23 NOTE — Telephone Encounter (Signed)
Patient came to clinic today stating she spoke with [facility] today and was told they never received order for prosthetic sleeve. Received fax confirmation receipt on 09/28/2019. Confirmed with Ulis Rias at [facility] that they did not receive this. Order for sleeve, demographics and OV notes from 09/28/2019 refaxed to [facility] at [phone]. Fax confirmation receipt received. Hubbard Hartshorn, BSN, RN-BC

## 2019-12-24 NOTE — Telephone Encounter (Signed)
I do not think I was the attending on 6/28. Looks like Dr Rebeca Alert cosigned the resident note on that day

## 2020-01-11 ENCOUNTER — Telehealth: Payer: Self-pay | Admitting: Orthopedic Surgery

## 2020-01-11 NOTE — Telephone Encounter (Signed)
Patient called. She would like to know if Dr. Sharol Given got the paper she left to sign for her a wheelchair. Her call back number is [phone]

## 2020-01-13 NOTE — Telephone Encounter (Signed)
I called and sw pt. She has an appt tomorrow that she would like to address some new issues with her hand and volume loss and new rx for this. Advised for the mobility device that she would need to select a company and then they will generate the paperwork that needs to be completed by the doctor. We would then set up a PT eval and complete the forms after that assessment. Pt voiced understanding with this and will contact [employer] or any other local mobility store in the are but again keep her appt for the new issues that she is having with her hand and for limb eval for new socket.

## 2020-01-14 ENCOUNTER — Encounter: Payer: Self-pay | Admitting: Orthopedic Surgery

## 2020-01-14 ENCOUNTER — Ambulatory Visit (INDEPENDENT_AMBULATORY_CARE_PROVIDER_SITE_OTHER): Payer: 59 | Admitting: Orthopedic Surgery

## 2020-01-14 ENCOUNTER — Ambulatory Visit: Payer: Self-pay

## 2020-01-14 DIAGNOSIS — M25531 Pain in right wrist: Secondary | ICD-10-CM

## 2020-01-14 DIAGNOSIS — Z89612 Acquired absence of left leg above knee: Secondary | ICD-10-CM

## 2020-01-14 DIAGNOSIS — M1711 Unilateral primary osteoarthritis, right knee: Secondary | ICD-10-CM

## 2020-01-14 NOTE — Progress Notes (Signed)
Office Visit Note   Patient: Stacy Thomas           Date of Birth: 27-Mar-1961           MRN: [id number] Visit Date: 01/14/2020              Requested by: Asencion Noble, MD [address],  [place] [address] PCP: Asencion Noble, MD  Chief Complaint  Patient presents with   Left Leg - Pain   Right Wrist - Pain      HPI: Patient is a 59 year old woman who presents complaining of right wrist pain which is better when she uses a Velcro brace.  Patient states she has been having increasing and bearing pressure in her left above-the-knee amputation and states that her leg hurts with walking.  Patient has had chronic osteoarthritis of the right knee and patient states she recently has had some flexogenic injections which did not provide much relief.  Assessment & Plan: Visit Diagnoses:  1. Pain in right wrist   2. Osteoarthritis of right knee, unspecified osteoarthritis type   3. S/P AKA (above knee amputation), left (HCC)     Plan: Patient's socket and liner are broken down she needs a new prosthetic liner materials and supplies a prescription was provided for [employer]otech she needs the new silicone liner.  Discussed with her right wrist symptoms these appear mechanical and patient needs to try an alternative way to unload pressure from her left leg she most likely will need a motorized personal device.  Patient has also been having increasing back pain from her antalgic gait from the left above-the-knee amputation and this would also be aided by use of a powered motor personal device.  Recommended that she could try Voltaren gel and a paraffin bath to help with the wrist symptoms.  Follow-Up Instructions: No follow-ups on file.   Ortho Exam  Patient is alert, oriented, no adenopathy, well-dressed, normal affect, normal respiratory effort. Examination patient has an abductor lurch and swings her body weight over the left side and an exaggerated position to assist with  ambulation.  Patient's wrist is minimally tender to palpation of the scaphoid scapholunate and TFCC she has no carpal tunnel symptoms at this time but she has had carpal tunnel syndrome in the past her symptoms seem to be mostly mechanical from weightbearing through the wrist.  Imaging: XR Wrist 2 Views Right  Result Date: 01/14/2020 2 view radiographs of the right wrist shows no bony abnormalities no cystic changes.  No fractures.  No images are attached to the encounter.  Labs: Lab Results  Component Value Date   HGBA1C 6.1 (A) 09/10/2019   HGBA1C 5.4 03/05/2016     Lab Results  Component Value Date   ALBUMIN 4.1 09/28/2019   ALBUMIN 4.4 09/09/2019   ALBUMIN 4.3 03/18/2019    No results found for: MG Lab Results  Component Value Date   VD25OH 15.8 (L) 05/03/2016    No results found for: PREALBUMIN CBC EXTENDED Latest Ref Rng & Units 09/09/2019 03/18/2019 09/08/2018  WBC 4.0 - 10.5 K/uL 6.3 5.1 5.3  RBC 3.87 - 5.11 Mil/uL 4.59 4.67 4.86  HGB 12.0 - 15.0 g/dL 14.1 14.3 15.2  HCT 36 - 46 % 40.7 42.0 43.1  PLT 150 - 400 K/uL 254.0 202.0 228  NEUTROABS 1.4 - 7.7 K/uL 2.2 2.0 -  LYMPHSABS 0.7 - 4.0 K/uL 3.3 2.4 -     There is no height or weight  on file to calculate BMI.  Orders:  Orders Placed This Encounter  Procedures   XR Wrist 2 Views Right   No orders of the defined types were placed in this encounter.    Procedures: No procedures performed  Clinical Data: No additional findings.  ROS:  All other systems negative, except as noted in the HPI. Review of Systems  Objective: Vital Signs: LMP 01/22/2011   Specialty Comments:  No specialty comments available.  PMFS History: Patient Active Problem List   Diagnosis Date Noted   CKD (chronic kidney disease) 09/11/2019   HLD (hyperlipidemia) 09/09/2018   Shortness of breath 09/08/2018   Fatigue 09/08/2018   Chronic cough 09/08/2018   Allergic sinusitis 12/03/2017   Osteoarthritis of right hip  12/03/2017   Hx of AKA (above knee amputation), left ([place]) 02/18/2017   Unilateral primary osteoarthritis, right knee 02/18/2017   Aphthous ulcer of tongue 10/29/2016   Vitamin D deficiency 05/04/2016   Bilateral primary osteoarthritis of hip 04/26/2016   Bilateral carpal tunnel syndrome 03/29/2016   Paresthesia of skin 03/23/2016   Atrophic vaginitis 03/06/2016   Screening for cervical cancer 03/06/2016   PVD (peripheral vascular disease) ([place]) 03/05/2016   Pancreatic abnormality 03/05/2016   HTN (hypertension) 03/05/2016   Hepatic cirrhosis ([place]) 11/15/2015   External hemorrhoids with pain & bleeding 10/27/2015   Chronic hepatitis C ([place]) 06/15/2015   Transaminitis 06/14/2015   Prolapsed internal hemorrhoids, grade 4 06/14/2015   Peripheral neuropathy 06/14/2015   Unilateral AKA ([place])- left 06/14/2015   Hx of osteosarcoma 06/14/2015   DYSPHAGIA 06/13/2007   ABDOMINAL PAIN-LUQ 06/13/2007   Past Medical History:  Diagnosis Date   Bilateral arm pain    chronic   Chronic hepatitis C ([place]) followed by dr comer at infectious disease   Genotype 1a--  currently on [place] started 09-13-2015   Cirrhosis ([place])    Dependence on crutches    left leg prosthesis   Drug addiction in remission ([place])    since 2007  (crack cocaine,  IV drug use)   External hemorrhoid, bleeding    GERD (gastroesophageal reflux disease)    Hand weakness    History of esophageal stricture    post dilation x2   History of osteosarcoma    1982  left knee---  s/p  AKA LLE--   per pt no recurrence   History of recreational drug use multiple rehab visits   IV drug use for 1 yr and Smoked crack for 30+yrs--  per pt clean since 2007   Hyperlipidemia    Hypertension    Idiopathic peripheral neuropathy    Myocardial infarction [facility])    Osteoarthritis    osteosarcoma    age 57 surg only lt leg   Prolapsed internal hemorrhoids, grade 4    Shortness of breath    Sigmoid  diverticulosis    mild   Sleep apnea    On CPAP   Substance abuse ([place])    Clean for 14 years    Family History  Problem Relation Age of Onset   COPD Father    Alcohol abuse Father        Deceased   Hypertension Mother        Living   Healthy Daughter    Healthy Son    Anesthesia problems Neg Hx    Breast cancer Neg Hx    Colon cancer Neg Hx    Esophageal cancer Neg Hx    Rectal cancer Neg Hx    Stomach  cancer Neg Hx     Past Surgical History:  Procedure Laterality Date   ABOVE KNEE LEG AMPUTATION Left 1982   osteosarcoma   COLONOSCOPY  last one 02-28-2009   ESOPHAGOGASTRODUODENOSCOPY (EGD) WITH ESOPHAGEAL DILATION  x2  last one 04-28-2008   EXCISION BREAST BX  2015   CYST   TONSILLECTOMY  as child   TRANSANAL HEMORRHOIDAL DEARTERIALIZATION N/A 10/27/2015   Procedure: TRANSANAL HEMORRHOIDAL DEARTERIALIZATION;  Surgeon: Michael Boston, MD;  Location: [facility];  Service: General;  Laterality: N/A;   Social History   Occupational History   Not on file  Tobacco Use   Smoking status: Former Smoker    Packs/day: 1.00    Years: 37.00    Pack years: 37.00    Types: Cigarettes    Start date: 04/02/1973    Quit date: 12/19/2014    Years since quitting: 5.0   Smokeless tobacco: Never Used  Vaping Use   Vaping Use: Never used  Substance and Sexual Activity   Alcohol use: No    Alcohol/week: 0.0 standard drinks   Drug use: No    Types: "Crack" cocaine    Comment: recovery addict since 2007   Sexual activity: Not on file

## 2020-01-20 ENCOUNTER — Telehealth: Payer: Self-pay | Admitting: Orthopedic Surgery

## 2020-01-20 ENCOUNTER — Other Ambulatory Visit: Payer: Self-pay

## 2020-01-20 DIAGNOSIS — M1711 Unilateral primary osteoarthritis, right knee: Secondary | ICD-10-CM

## 2020-01-20 DIAGNOSIS — Z89612 Acquired absence of left leg above knee: Secondary | ICD-10-CM

## 2020-01-20 DIAGNOSIS — M25531 Pain in right wrist: Secondary | ICD-10-CM

## 2020-01-20 NOTE — Telephone Encounter (Signed)
Called and sw pt and she advised that she was going to call [employer] to see if she could get her motorized chair from them. They participate with her insurance company. Advised that I would send the order for PT assessment and she will call back once she has spoken with the company and if rx is needed to start process.

## 2020-01-20 NOTE — Telephone Encounter (Signed)
Pt called asking if autumn will CB in regards to a PT eval  [phone]

## 2020-01-20 NOTE — Telephone Encounter (Signed)
Patient called and did not disclosed there nature of her call. She asked for Autumn F. To call her back at [phone].

## 2020-01-25 ENCOUNTER — Telehealth: Payer: Self-pay

## 2020-01-25 NOTE — Telephone Encounter (Signed)
Patient called she is requesting a call back she stated she needs you to get in touch with PT. Call back:[phone]

## 2020-01-25 NOTE — Telephone Encounter (Signed)
Duplicate message in chart.  

## 2020-01-25 NOTE — Telephone Encounter (Signed)
I called patient. She states that she needs [place] to get the referral in for PT. I advised referral has been entered and gave her the number to [facility] to call and set up her appt.  Patient also states that when she was last in the office, she was given a rx for a [facility] and she needs another one. She requests we call [employer] at [phone] to set up the appt for them to come to her house for assessment.

## 2020-01-27 NOTE — Telephone Encounter (Signed)
Please let me know if there is something that you would like for me to do. Thanks.

## 2020-01-28 ENCOUNTER — Ambulatory Visit
Admission: RE | Admit: 2020-01-28 | Discharge: 2020-01-28 | Disposition: A | Discharge status: Home / Self Care | Payer: 59 | Source: Ambulatory Visit | Attending: Internal Medicine | Admitting: Internal Medicine

## 2020-01-28 ENCOUNTER — Other Ambulatory Visit: Payer: Self-pay

## 2020-01-28 ENCOUNTER — Other Ambulatory Visit: Payer: Self-pay | Admitting: Internal Medicine

## 2020-01-28 DIAGNOSIS — Z1231 Encounter for screening mammogram for malignant neoplasm of breast: Secondary | ICD-10-CM

## 2020-02-01 ENCOUNTER — Encounter: Payer: Self-pay | Admitting: Internal Medicine

## 2020-02-02 ENCOUNTER — Other Ambulatory Visit: Payer: Self-pay

## 2020-02-02 DIAGNOSIS — K7469 Other cirrhosis of liver: Secondary | ICD-10-CM

## 2020-02-02 DIAGNOSIS — K7460 Unspecified cirrhosis of liver: Secondary | ICD-10-CM

## 2020-02-02 NOTE — Telephone Encounter (Signed)
Pt aware must make appt with mobility company

## 2020-02-08 ENCOUNTER — Telehealth: Payer: Self-pay

## 2020-02-08 NOTE — Telephone Encounter (Signed)
-----   Message from Roetta Sessions, [place] sent at 02/02/2020  8:52 AM EDT ----- Regarding: FW: RUQ U/S due Order for RUQ U/S is in. Call to schedule. Pt is on Mychart  ----- Message ----- From: Roetta Sessions, CMA Sent: 02/02/2020 To: Roetta Sessions, CMA Subject: RUQ U/S due                                    [place] screening due around November 11

## 2020-02-08 NOTE — Telephone Encounter (Signed)
Patient is due for 6 month U/S for cirrhosis - HCC screening. Scheduled her for Tuesday, Nov. 16 at 9:30am, arr 9:15am, NPO after midnight.  Called and informed patient. She confirmed appt.  She is due for a colonoscopy in December but she needs an early Monday morning appt ONLY.  Dr. Havery Moros does not have a early morning Monday appt in December. We will wait until January schedule comes out.

## 2020-02-12 ENCOUNTER — Other Ambulatory Visit: Payer: Self-pay

## 2020-02-12 ENCOUNTER — Ambulatory Visit: Payer: 59 | Attending: Orthopedic Surgery | Admitting: Physical Therapy

## 2020-02-12 ENCOUNTER — Telehealth: Payer: Self-pay | Admitting: Orthopedic Surgery

## 2020-02-12 DIAGNOSIS — M25531 Pain in right wrist: Secondary | ICD-10-CM | POA: Diagnosis present

## 2020-02-12 DIAGNOSIS — R296 Repeated falls: Secondary | ICD-10-CM | POA: Insufficient documentation

## 2020-02-12 DIAGNOSIS — R2689 Other abnormalities of gait and mobility: Secondary | ICD-10-CM | POA: Diagnosis present

## 2020-02-12 DIAGNOSIS — M25561 Pain in right knee: Secondary | ICD-10-CM | POA: Insufficient documentation

## 2020-02-12 DIAGNOSIS — G8929 Other chronic pain: Secondary | ICD-10-CM | POA: Insufficient documentation

## 2020-02-12 DIAGNOSIS — R208 Other disturbances of skin sensation: Secondary | ICD-10-CM | POA: Diagnosis present

## 2020-02-12 DIAGNOSIS — R293 Abnormal posture: Secondary | ICD-10-CM | POA: Diagnosis present

## 2020-02-12 DIAGNOSIS — M6281 Muscle weakness (generalized): Secondary | ICD-10-CM | POA: Diagnosis present

## 2020-02-12 NOTE — Therapy (Addendum)
[facility] [address] [place], [place], [address] Phone: [phone]   Fax:  [phone]  Physical Therapy Evaluation  Patient Details  Name: Stacy Thomas MRN: [id number] Date of Birth: June 06, 1960 Referring Provider (PT): Newt Minion, MD   Encounter Date: 02/12/2020   PT End of Session - 02/12/20 1251    Visit Number 1    Number of Visits 1    Date for PT Re-Evaluation 02/12/20    Authorization Type U[employer]    PT Start Time 2263    PT Stop Time 1230    PT Time Calculation (min) 45 min    Activity Tolerance Patient tolerated treatment well    Behavior During Therapy [facility] for tasks assessed/performed           Past Medical History:  Diagnosis Date  . Bilateral arm pain    chronic  . Chronic hepatitis C ([place]) followed by dr comer at infectious disease   Genotype 1a--  currently on Harvoni started 09-13-2015  . Cirrhosis ([place])   . Dependence on crutches    left leg prosthesis  . Drug addiction in remission ([place])    since 2007  (crack cocaine,  IV drug use)  . External hemorrhoid, bleeding   . GERD (gastroesophageal reflux disease)   . Hand weakness   . History of esophageal stricture    post dilation x2  . History of osteosarcoma    1982  left knee---  s/p  AKA LLE--   per pt no recurrence  . History of recreational drug use multiple rehab visits   IV drug use for 1 yr and Smoked crack for 30+yrs--  per pt clean since 2007  . Hyperlipidemia   . Hypertension   . Idiopathic peripheral neuropathy   . Myocardial infarction ([place])   . Osteoarthritis   . osteosarcoma    age 9 surg only lt leg  . Prolapsed internal hemorrhoids, grade 4   . Shortness of breath   . Sigmoid diverticulosis    mild  . Sleep apnea    On CPAP  . Substance abuse ([place])    Clean for 14 years    Past Surgical History:  Procedure Laterality Date  . ABOVE KNEE LEG AMPUTATION Left 1982   osteosarcoma  . COLONOSCOPY  last one  02-28-2009  . ESOPHAGOGASTRODUODENOSCOPY (EGD) WITH ESOPHAGEAL DILATION  x2  last one 04-28-2008  . EXCISION BREAST BX  2015   CYST  . TONSILLECTOMY  as child  . TRANSANAL HEMORRHOIDAL DEARTERIALIZATION N/A 10/27/2015   Procedure: TRANSANAL HEMORRHOIDAL DEARTERIALIZATION;  Surgeon: Michael Boston, MD;  Location: [facility];  Service: General;  Laterality: N/A;    There were no vitals filed for this visit.    Subjective Assessment - 02/12/20 1247    Subjective Pt referred to outpatient neurorehab for evaluation for power wheelchair.  Pt has ambulated with prosthesis or crutches since 1982.  Pt now presents with severe residual limb pain, back pain, R knee pain and wrist pain and is less able to use prosthesis or crutches.  Pt being referred for evaluation for power mobility for home and community mobility.    Pertinent History chronic hepatits C, cirrhosis, drug addiction in remission, osteosarcoma s/p L AKA in 1982, HLD, HTN, idiopathic peripheral neuropathy, MI, OA, sleep apnea on CPAP    Limitations Standing;Walking;House hold activities    Currently in Pain? Yes    Pain Location Wrist    Pain Orientation Right  [facility] PT Assessment - 02/12/20 1249      Assessment   Medical Diagnosis L AKA, pain in R wrist, R knee OA    Referring Provider (PT) Newt Minion, MD    Onset Date/Surgical Date 01/20/20    Hand Dominance Left      Precautions   Precautions Fall;Other (comment)    Precaution Comments chronic hepatits C, cirrhosis, drug addiction in remission, osteosarcoma s/p L AKA in 1982, HLD, HTN, idiopathic peripheral neuropathy, MI, OA, sleep apnea on CPAP      Balance Screen   Has the patient fallen in the past 6 months Yes    How many times? 3-4    Has the patient had a decrease in activity level because of a fear of falling?  Yes      Prior Function   Level of Independence Independent with household mobility with device;Independent with community  mobility with device;Independent with homemaking with ambulation;Independent with gait;Independent with transfers;Requires assistive device for independence      Standardized Balance Assessment   Standardized Balance Assessment Timed Up and Go Test      Timed Up and Go Test   TUG Normal TUG    Normal TUG (seconds) 15.9    TUG Comments high falls risk             Mobility/Seating Evaluation    PATIENT INFORMATION: Name: Stacy Thomas DOB: 1960-06-02  Sex: Female Date seen: 11.12.2021 Time: 11:45  Address:  [address]  [place] [address] Physician: Newt Minion, MD This evaluation/justification form will serve as the LMN for the following suppliers: __________________________ Supplier: [employer] Contact Person: Luz Brazen, [employer] Phone:  [phone]   Seating Therapist: Misty Thomas, PT Phone:   [phone]   Phone: [phone]     Spouse/Parent/Caregiver name: Sheliah Mends  Phone number: [phone] Insurance/Payer: [facility]     Reason for Referral: Power Mobility  Patient/Caregiver Goals: To maintain independent mobility; is less able to utilize prosthesis  Patient was seen for face-to-face evaluation for new power wheelchair.  Also present was [employer], [employer] to discuss recommendations and wheelchair options.  Further paperwork was completed and sent to vendor.  Patient appears to qualify for power mobility device at this time per objective findings.   MEDICAL HISTORY: Diagnosis: Primary Diagnosis: Left AKA Onset: 1982 Diagnosis: OA of right knee, R wrist injury   [] Progressive Disease Relevant past and future surgeries: L AKA 1982     Height: 5'1" Weight: 160 lb Explain recent changes or trends in weight: None   History including Falls: In the last 6 months 3-4 falls due to catching foot or LE giving out.  PMH: chronic hepatits C, cirrhosis, drug addiction in remission, osteosarcoma s/p L AKA in 1982, HLD, HTN, idiopathic peripheral  neuropathy, MI, OA, sleep apnea on CPAP, bilateral carpal tunnel    HOME ENVIRONMENT: [x] House  [] Condo/town home  [] Apartment  [] Assisted Living    [] Lives Alone [x]  Lives with Others  Hours with caregiver: ?????  [x] Home is accessible to patient           Stairs      [] Yes []  No     Ramp [x] Yes [] No Comments:  Wood floor in [employer], carpet throughout rest of house.     COMMUNITY ADL: TRANSPORTATION: [x] Car    [] Van    [] Public Transportation    [] Adapt[employer] w/c Lift    [] Ambulance    [] Other:       [] Sits in wheelchair during transport  Employment/School: ????? Specific requirements pertaining to mobility ?????  Other: Large sedan car or pick up truck for transportation    FUNCTIONAL/SENSORY PROCESSING SKILLS:  Handedness:   [] Right     [x] Left    [] NA  Comments:  ?????  Functional Processing Skills for Wheeled Mobility [x] Processing Skills are adequate for safe wheelchair operation  Areas of concern than may interfere with safe operation of wheelchair Description of problem   []  Attention to environment      [] Judgment      []  Hearing  []  Vision or visual processing      [] Motor Planning  []  Fluctuations in Behavior  ?????    VERBAL COMMUNICATION: [x] WFL receptive [x]  WFL expressive [] Understandable  [] Difficult to understand  [] non-communicative []  Uses an augmented communication device  CURRENT SEATING / MOBILITY: Current Mobility Base:  [x] None [] Dependent [] Manual [] Scooter [] Power  Type of Control: ?????  Manufacturer:  ?????Size:  ?????Age: ?????  Current Condition of Mobility Base:  ?????   Current Wheelchair components:  ?????  Describe posture in present seating system:  ?????      SENSATION and SKIN ISSUES: Sensation [] Intact  [x] Impaired [] Absent  Level of sensation: impaired in hands and R foot due to peripheral neuropathy Pressure Relief: Able to perform effective pressure  relief :    [x] Yes  []  No Method: Standing when she is able to wear prosthesis; stand short duration with crutches when unable to wear prosthesis but crutches place pressure on nerve/vascular system in axillary region If not, Why?: ?????  Skin Issues/Skin Integrity Current Skin Issues  [] Yes [x] No [] Intact []  Red area[]  Open Area  [x] Scar Tissue [] At risk from prolonged sitting Where  L residual limb  History of Skin Issues  [] Yes [x] No Where  ????? When  ?????  Hx of skin flap surgeries  [] Yes [x] No Where  ????? When  ?????  Limited sitting tolerance [] Yes [x] No Hours spent sitting in wheelchair daily: ?????  Complaint of Pain:  Please describe: Bilat hands and R wrist pain from pressure through UE when she isn't able to wear her prosthesis.  Soreness, pressure and pain in L residual limb - not able to wear prosthesis for multiple days (3/7) due to pain and shape of residual limb.  Pt also experiences L phantom pain.  Pt also experiences back pain due to antalgic gait and abnormal posture.  Pain in R knee due to severe OA.     Swelling/Edema: RLE edema due to OA   ADL STATUS (in reference to wheelchair use):  Indep Assist Unable Indep with Equip Not assessed Comments  Dressing []  []  []  [x]  []  with prosthesis  Eating []  []  []  [x]  []  with prosthesis  Toileting []  []  []  [x]  []  with prosthesis  Bathing []  []  []  [x]  []  sits in tub but has experienced multiple falls getting out of tub  Grooming/Hygiene []  []  []  [x]  []  with prosthesis  Meal Prep []  []  []  [x]  []  with prosthesis  IADLS []  []  []  [x]  []  with prosthesis  Bowel Management: [x] Continent  [] Incontinent  [] Accidents Comments:  ?????  Bladder Management: [x] Continent  [] Incontinent  [] Accidents Comments:  ?????     WHEELCHAIR SKILLS: Manual w/c Propulsion: [] UE or LE strength and endurance sufficient to participate in ADLs using manual wheelchair Arm : [] left [] right   [] Both      Distance: ????? Foot:  [] left [] right   [] Both    Operate Scooter: []  Strength, hand grip, balance and transfer appropriate for use [] Living environment is accessible for use of scooter  Operate Power w/c:  [x]  Std. Joystick   []  Alternative Controls Indep [x]  Assist []  Dependent/unable []  N/A []   [x] Safe          [x]  Functional      Distance: ?????  Bed confined without wheelchair []  Yes [x]  No   STRENGTH/RANGE OF MOTION:  AROM Range of Motion Strength  Shoulder 120 deg flexion 4-/5 with pain  Elbow WFL 4-/5 with pain  Wrist/Hand Limited wrist and finger flexion due to OA pain and edema 3/5  Hip WFL 3/5 LLE hip, 3+/5 RLE  Knee LLE prosthesis; R limited extension and flexion due to severe OA LLE prosthesis, 3+/5 R knee   Ankle LLE prosthesis; R ankle WFL LLE prosthesis, 3/5 R ankle     MOBILITY/BALANCE:  []  Patient is totally dependent for mobility  ?????    Balance Transfers Ambulation  Sitting Balance: Standing Balance: [x]  Independent []  Independent/Modified Independent  [x]  WFL     [x]  WFL []  Supervision []  Supervision  []  Uses UE for balance  []  Supervision []  Min Assist []  Ambulates with Assist  ?????    []  Min Assist []  Min assist []  Mod Assist [x]  Ambulates with Device:      []  RW  []  StW  []  Cane  [x]  Prosthesis and crutches   []  Mod Assist []  Mod assist []  Max assist   []  Max Assist []  Max assist []  Dependent []  Indep. Short Distance Only  []  Unable []  Unable []  Lift / Sling Required Distance (in feet)  ?????   []  Sliding board []  Unable to Ambulate (see explanation below)  Cardio Status:  [] Intact  [x]  Impaired   []  NA     Myocardial Infarction  Respiratory Status:  [x] Intact   [] Impaired   [] NA     ?????  Orthotics/Prosthetics: L prosthesis  Comments (Address manual vs power w/c vs scooter): Stacy Thomas has mobility limitation that significantly impairs safe, timely participation in one or more mobility related ADL's.  Stacy Thomas was diagnosed with osteosarcoma and underwent a left above knee amputation in 1982.   Since 1982 Stacy Thomas has been ambulatory with a left prosthesis but more recently Stacy Thomas is less able to utilize her prosthesis.  Stacy Thomas has developed severe OA in her right knee causing her to place more weight and stress on her left residual limb.  Due to the conical shape of her residual limb Stacy Thomas experiences significant pain and pressure in her left leg.  This increased pressure has also placed her at increased risk for skin breakdown on her residual limb.  Many days Stacy Thomas is unable to wear her prosthesis and must utilize crutches for mobility.  With the increased use of crutches Stacy Thomas has developed a right wrist injury and now experiences increased pain, edema and decreased finger flexion ROM and strength in her right wrist and hand.  Because of her wrist injury she must lean more heavily on her  crutches causing compression of her axillary nerves and vascular system resulting in bilateral UE numbness.  Calea was previously using her prosthesis to perform her MRADL's but more recently she has resorted to using a rolling desk chair for mobility in her home environment.  Using a desk chair has significantly limited her ability to perform MRADL's independently and places her at increased risk for falling.   Stacy Thomas's mobility deficit cannot be remediated with a cane or walker due to severe OA and pain in her right knee and right wrist, hand and LE weakness.  Elsi completed a Timed Up and Go test with her left prosthesis and required 15.9 seconds to complete.  TUG times greater than 13.5 seconds is indicates a person is at high risk for recurrent falls.  Over the last 6 months Treyana has experienced 4-5 falls due to decreased foot clearance on her prosthetic side and due to LE weakness.  Maliya is unable to functionally propel a manual wheelchair due to hand and wrist pain, limited hand and wrist AROM and impaired sensation due to peripheral neuropathy.  Stacy Thomas is not able to safely operate a power scooter (POV)  due to lack of upper extremity strength, range of motion and impaired sensation to drive a tiller style propulsion system.  Stacy Thomas also presents with multiple obliquities and postural abnormalities that could not be accommodated for by the seating provided on a power scooter.  Without her prosthesis donned, Stacy Thomas lacks sufficient standing balance to safely transfer on to and off a power scooter.  Stacy Thomas requires the use of a power wheelchair for more independent mobility to safely perform MRADLs within her home environment and to reduce her risk for falls.?????         Anterior / Posterior Obliquity Rotation-Pelvis ?????  PELVIS    []  []  [x]   Neutral Posterior Anterior  []  []  [x]   WFL Rt elev Lt elev  [x]  []  []   WFL Right Left                      Anterior    Anterior     []  Fixed []  Other [x]  Partly Flexible []  Flexible   [x]  Fixed []  Other []  Partly Flexible  []  Flexible  []  Fixed []  Other []  Partly Flexible  []  Flexible   TRUNK  []  []  [x]   WFL ? Thoracic ? Lumbar  Kyphosis Lordosis  []  [x]  []   WFL Convex Convex  Right Left [x] c-curve [] s-curve [] multiple  [x]  Neutral []  Left-anterior []  Right-anterior     [x]  Fixed []  Flexible []  Partly Flexible []  Other  [x]  Fixed []  Flexible []  Partly Flexible []  Other  []  Fixed             []  Flexible []  Partly Flexible []  Other    Position Windswept  ?????  HIPS          [x]            []               []    Neutral       Abduct        ADduct         [x]           []            []   Neutral Right           Left      []  Fixed []  Subluxed []  Partly Flexible []  Dislocated []   Flexible  []  Fixed []  Other []  Partly Flexible  []  Flexible                 Foot Positioning Knee Positioning  ?????    [x]  WFL  [] Lt [x] Rt [x]  WFL  [] Lt [x] Rt    KNEES ROM concerns: ROM concerns:    & Dorsi-Flexed [] Lt [] Rt ?????    FEET Plantar Flexed [] Lt [] Rt      Inversion                 [] Lt [] Rt      Eversion                 [] Lt [] Rt     HEAD  [x]  Functional [x]  Good Head Control  ?????  & []  Flexed         []  Extended []  Adequate Head Control    NECK []  Rotated  Lt  []  Lat Flexed Lt []  Rotated  Rt []  Lat Flexed Rt []  Limited Head Control     []  Cervical Hyperextension []  Absent  Head Control     SHOULDERS ELBOWS WRIST& HAND significantly limited grip and limited finger flexion      Left     Right    Left     Right    Left     Right   U/E [x] Functional           [x] Functional WFL WFL [] Fisting             [] Fisting      [] elev   [] dep      [] elev   [] dep       [] pro -[] retract     [] pro  [] retract [] subluxed             [] subluxed           Goals for Wheelchair Mobility  [x]  Independence with mobility in the home with motor related ADLs (MRADLs)  [x]  Independence with MRADLs in the community []  Provide dependent mobility  []  Provide recline     [] Provide tilt   Goals for Seating system [x]  Optimize pressure distribution [x]  Provide support needed to facilitate function or safety []  Provide corrective forces to assist with maintaining or improving posture [x]  Accommodate client's posture:   current seated postures and positions are not flexible or will not tolerate corrective forces []  Client to be independent with relieving pressure in the wheelchair [] Enhance physiological function such as breathing, swallowing, digestion  Simulation ideas/Equipment trials:????? State why other equipment was unsuccessful:?????   MOBILITY BASE RECOMMENDATIONS and JUSTIFICATION: MOBILITY COMPONENT JUSTIFICATION  Manufacturer: [employer] RiderModel: Jimmie   Size: Width 18Seat Depth 16 [x] provide transport from point A to B      [x] promote Indep mobility  [] is not a safe, functional ambulator [x] walker or cane inadequate [] non-standard width/depth necessary to accommodate anatomical measurement []  ?????  [] Manual Mobility Base [] non-functional ambulator    [] Scooter/POV  [] can safely operate  [] can safely transfer   [] has adequate trunk  stability  [] cannot functionally propel manual w/c  [x] Power Mobility Base  [] non-ambulatory  [x] cannot functionally propel manual wheelchair  [x]  cannot functionally and safely operate scooter/POV [x] can safely operate and willing to  [] Stroller Base [] infant/child  [] unable to propel manual wheelchair [] allows for growth [] non-functional ambulator [] non-functional UE [] Indep mobility is not a goal at this time  [] Tilt  [] Forward [] Backward [] Powered tilt  [] Manual tilt  [] change position against gravitational force on head and shoulders  [] change  position for pressure relief/cannot weight shift [] transfers  [] management of tone [] rest periods [] control edema [] facilitate postural control  []  ?????  [] Recline  [] Power recline on power base [] Manual recline on manual base  [] accommodate femur to back angle  [] bring to full recline for ADL care  [] change position for pressure relief/cannot weight shift [] rest periods [] repositioning for transfers or clothing/diaper /catheter changes [] head positioning  [] Lighter weight required [] self- propulsion  [] lifting []  ?????  [] Heavy Duty required [] user weight greater than 250# [] extreme tone/ over active movement [] broken frame on previous chair []  ?????  []  Back  []  Angle Adjustable []  Custom molded ????? [] postural control [] control of tone/spasticity [] accommodation of range of motion [] UE functional control [] accommodation for seating system []  ????? [] provide lateral trunk support [] accommodate deformity [] provide posterior trunk support [] provide lumbar/sacral support [] support trunk in midline [] Pressure relief over spinal processes  []  Seat Cushion ????? [] impaired sensation  [] decubitus ulcers present [] history of pressure ulceration [] prevent pelvic extension [] low maintenance  [] stabilize pelvis  [] accommodate obliquity [] accommodate multiple deformity [] neutralize lower extremity position [] increase  pressure distribution []  ?????  []  Pelvic/thigh support  []  Lateral thigh guide []  Distal medial pad  []  Distal lateral pad []  pelvis in neutral [] accommodate pelvis []  position upper legs []  alignment []  accommodate ROM []  decr adduction [] accommodate tone [] removable for transfers [] decr abduction  []  Lateral trunk Supports []  Lt     []  Rt [] decrease lateral trunk leaning [] control tone [] contour for increased contact [] safety  [] accommodate asymmetry []  ?????  []  Mounting hardware  [] lateral trunk supports  [] back   [] seat [] headrest      []  thigh support [] fixed   [] swing away [] attach seat platform/cushion to w/c frame [] attach back cushion to w/c frame [] mount postural supports [] mount headrest  [] swing medial thigh support away [] swing lateral supports away for transfers  []  ?????    Armrests  [] fixed [x] adjustable height [] removable   [] swing away  [x] flip back   [] reclining [x] full length pads [] desk    [] pads tubular  [x] provide support with elbow at 90   [] provide support for w/c tray [x] change of height/angles for variable activities [x] remove for transfers [] allow to come closer to table top [x] remove for access to tables []  ?????  Hangers/ Leg rests  [] 60 [] 70 [] 90 [] elevating [] heavy duty  [] articulating [] fixed [] lift off [] swing away     [] power [] provide LE support  [] accommodate to hamstring tightness [] elevate legs during recline   [] provide change in position for Legs [] Maintain placement of feet on footplate [] durability [] enable transfers [] decrease edema [] Accommodate lower leg length []  ?????  Foot support Footplate    [] Lt  []  Rt  []  Center mount [] flip up     [] depth/angle adjustable [] Amputee adapter    []  Lt     []  Rt [] provide foot support [] accommodate to ankle ROM [] transfers [] Provide support for residual extremity []  allow foot to go under wheelchair base []  decrease tone  []  ?????  []  Ankle strap/heel loops [] support foot on  foot support [] decrease extraneous movement [] provide input to heel  [] protect foot  Tires: [] pneumatic  [] flat free inserts  [] solid  [] decrease maintenance  [] prevent frequent flats [] increase shock absorbency [] decrease pain from road shock [] decrease spasms from road shock []  ?????  []  Headrest  [] provide posterior head support [] provide posterior neck support [] provide lateral head support [] provide anterior head support [] support during tilt and recline [] improve feeding   [] improve respiration [] placement of switches [] safety  [] accommodate ROM  [] accommodate  tone [] improve visual orientation  []  Anterior chest strap []  Vest []  Shoulder retractors  [] decrease forward movement of shoulder [] accommodation of TLSO [] decrease forward movement of trunk [] decrease shoulder elevation [] added abdominal support [] alignment [] assistance with shoulder control  []  ?????  Pelvic Positioner [x] Belt [] SubASIS bar [] Dual Pull [] stabilize tone [x] decrease falling out of chair/ **will not Decr potential for sliding due to pelvic tilting [] prevent excessive rotation [] pad for protection over boney prominence [] prominence comfort [] special pull angle to control rotation []  ?????  Upper Extremity Support [] L   []  R [] Arm trough    [] hand support []  tray       [] full tray [] swivel mount [] decrease edema      [] decrease subluxation   [] control tone   [] placement for AAC/Computer/EADL [] decrease gravitational pull on shoulders [] provide midline positioning [] provide support to increase UE function [] provide hand support in natural position [] provide work surface   POWER WHEELCHAIR CONTROLS  [x] Proportional  [] Non-Proportional Type Joystick [x] Left  [] Right [x] provides access for controlling wheelchair   [] lacks motor control to operate proportional drive control [] unable to understand proportional controls  Actuator Control Module  [] Single  [] Multiple   [] Allow the client  to operate the power seat function(s) through the joystick control   [] Safety Reset Switches [] Used to change modes and stop the wheelchair when driving in latch mode    [] Upgraded Electronics   [] programming for accurate control [] progressive Disease/changing condition [] non-proportional drive control needed [] Needed in order to operate power seat functions through joystick control   [] Display box [] Allows user to see in which mode and drive the wheelchair is set  [] necessary for alternate controls    [] Digital interface electronics [] Allows w/c to operate when using alternative drive controls  [] ASL Head Array [] Allows client to operate wheelchair  through switches placed in tri-panel headrest  [] Sip and puff with tubing kit [] needed to operate sip and puff drive controls  [] Upgraded tracking electronics [] increase safety when driving [] correct tracking when on uneven surfaces  [x] Mount for switches or joystick [x] Attaches switches to w/c  [x] Swing away for access or transfers [] midline for optimal placement [] provides for consistent access  [] Attendant controlled joystick plus mount [] safety [] long distance driving [] operation of seat functions [] compliance with transportation regulations []  ?????    Rear wheel placement/Axle adjustability [] None [] semi adjustable [] fully adjustable  [] improved UE access to wheels [] improved stability [] changing angle in space for improvement of postural stability [] 1-arm drive access [] amputee pad placement []  ?????  Wheel rims/ hand rims  [] metal  [] plastic coated [] oblique projections [] vertical projections [] Provide ability to propel manual wheelchair  []  Increase self-propulsion with hand weakness/decreased grasp  Push handles [] extended  [] angle adjustable  [] standard [] caregiver access [] caregiver assist [] allows "hooking" to enable increased ability to perform ADLs or maintain balance  One armed device  [] Lt   [] Rt [] enable propulsion  of manual wheelchair with one arm   []  ?????   Brake/wheel lock extension []  Lt   []  Rt [] increase indep in applying wheel locks   [] Side guards [] prevent clothing getting caught in wheel or becoming soiled []  prevent skin tears/abrasions  Battery: ES 12 x 2 [x] to power wheelchair ?????  Other: ????? ????? ?????  The above equipment has a life- long use expectancy. Growth and changes in medical and/or functional conditions would be the exceptions. This is to certify that the therapist has no financial relationship with durable medical provider or manufacturer. The therapist will not receive remuneration of any kind for the equipment recommended  in this evaluation.   Patient has mobility limitation that significantly impairs safe, timely participation in one or more mobility related ADL's.  (bathing, toileting, feeding, dressing, grooming, moving from room to room)                                                             [x]  Yes []  No Will mobility device sufficiently improve ability to participate and/or be aided in participation of MRADL's?         [x]  Yes []  No Can limitation be compensated for with use of a cane or walker?                                                                                []  Yes [x]  No Does patient or caregiver demonstrate ability/potential ability & willingness to safely use the mobility device?   [x]  Yes []  No Does patient's home environment support use of recommended mobility device?                                                    [x]  Yes []  No Does patient have sufficient upper extremity function necessary to functionally propel a manual wheelchair?    []  Yes [x]  No Does patient have sufficient strength and trunk stability to safely operate a POV (scooter)?                                  []  Yes [x]  No Does patient need additional features/benefits provided by a power wheelchair for MRADL's in the home?       [x]  Yes []  No Does the patient demonstrate the  ability to safely use a power wheelchair?                                                              [x]  Yes []  No  Therapist Name Printed: Stacy Thomas. Melrose Nakayama Date: 11.12.2021  Therapist's Signature:   Date:   Supplier's Name Printed: Stacy Thomas Date: 11.12.2021  Supplier's Signature:   Date:  Patient/Caregiver Signature:   Date:     This is to certify that I have read this evaluation and do agree with the content within:      Physician's Name Printed: Newt Minion, MD  19 Signature:  Date:     This is to certify that I, the above signed therapist have the following affiliations: []  This DME provider []  Manufacturer of recommended equipment []  Patient's long term care facility [x]  None of the above     Objective measurements completed  on examination: See above findings.               PT Education - 02/12/20 1250    Education Details Power mobility recommendations and process for obtaining power w/c, time frame to receive power w/c    Person(s) Educated Patient    Methods Explanation    Comprehension Verbalized understanding                       Plan - 02/12/20 1252    Clinical Impression Statement Pt is a 59 y.o. female referred to [facility] for evaluation for power mobility due to chronic use of prosthesis or crutches.  Pt presents with the following impairments and functional limitations: pain in bilateral UE, wrists and hand, pain in R knee, impaired UE and LE strength, decreased UE functional ROM, impaired posture, abnormal gait and increased falls risk as indicated by TUG 15.9 seconds.  Pt would benefit from group 2 power mobility for joint conservation, to maximize functional mobility independence and decrease falls risk with MRADL when pt is not able to utilize her prosthesis or crutches.    Personal Factors and Comorbidities Comorbidity 3+;Fitness;Past/Current Experience    Comorbidities chronic hepatits C, cirrhosis, drug  addiction in remission, osteosarcoma s/p L AKA in 1982, HLD, HTN, idiopathic peripheral neuropathy, MI, OA, sleep apnea on CPAP    Examination-Activity Limitations Bathing;Locomotion Level;Stairs;Stand;Transfers    Examination-Participation Restrictions Church;Community Activity;Meal Prep    Stability/Clinical Decision Making Evolving/Moderate complexity    Clinical Decision Making Moderate    Rehab Potential Good    PT Frequency One time visit    PT Duration Other (comment)   one time visit for wheelchair evaluation only   PT Treatment/Interventions Other (comment)   wheelchair management          Patient will benefit from skilled therapeutic intervention in order to improve the following deficits and impairments:  Abnormal gait, Decreased activity tolerance, Decreased balance, Decreased range of motion, Decreased mobility, Decreased strength, Increased edema, Difficulty walking, Impaired sensation, Impaired UE functional use, Postural dysfunction, Pain, Prosthetic Dependency  Visit Diagnosis: Repeated falls  Other abnormalities of gait and mobility  Pain in right wrist  Chronic pain of right knee  Abnormal posture  Other disturbances of skin sensation  Muscle weakness (generalized)     Problem List Patient Active Problem List   Diagnosis Date Noted  . CKD (chronic kidney disease) 09/11/2019  . HLD (hyperlipidemia) 09/09/2018  . Shortness of breath 09/08/2018  . Fatigue 09/08/2018  . Chronic cough 09/08/2018  . Allergic sinusitis 12/03/2017  . Osteoarthritis of right hip 12/03/2017  . Hx of AKA (above knee amputation), left ([place]) 02/18/2017  . Unilateral primary osteoarthritis, right knee 02/18/2017  . Aphthous ulcer of tongue 10/29/2016  . Vitamin D deficiency 05/04/2016  . Bilateral primary osteoarthritis of hip 04/26/2016  . Bilateral carpal tunnel syndrome 03/29/2016  . Paresthesia of skin 03/23/2016  . Atrophic vaginitis 03/06/2016  . Screening for cervical  cancer 03/06/2016  . PVD (peripheral vascular disease) ([place]) 03/05/2016  . Pancreatic abnormality 03/05/2016  . HTN (hypertension) 03/05/2016  . Hepatic cirrhosis ([place]) 11/15/2015  . External hemorrhoids with pain & bleeding 10/27/2015  . Chronic hepatitis C ([place]) 06/15/2015  . Transaminitis 06/14/2015  . Prolapsed internal hemorrhoids, grade 4 06/14/2015  . Peripheral neuropathy 06/14/2015  . Unilateral AKA ([place])- left 06/14/2015  . Hx of osteosarcoma 06/14/2015  . DYSPHAGIA 06/13/2007  . ABDOMINAL PAIN-LUQ 06/13/2007    Stacy Thomas,  PT, DPT 02/12/20    12:59 PM    [facility] [address] [place], [place], [address] Phone: [phone]   Fax:  [phone]  Name: Stacy Thomas MRN: [id number] Date of Birth: 10-09-1960

## 2020-02-12 NOTE — Telephone Encounter (Signed)
01/14/20 ov note faxed to [place]

## 2020-02-16 ENCOUNTER — Ambulatory Visit (HOSPITAL_COMMUNITY)
Admission: RE | Admit: 2020-02-16 | Discharge: 2020-02-16 | Disposition: A | Discharge status: Home / Self Care | Payer: 59 | Source: Ambulatory Visit | Attending: Gastroenterology | Admitting: Gastroenterology

## 2020-02-16 ENCOUNTER — Other Ambulatory Visit: Payer: Self-pay

## 2020-02-16 DIAGNOSIS — K7469 Other cirrhosis of liver: Secondary | ICD-10-CM

## 2020-02-16 DIAGNOSIS — K7460 Unspecified cirrhosis of liver: Secondary | ICD-10-CM

## 2020-02-18 ENCOUNTER — Other Ambulatory Visit (HOSPITAL_COMMUNITY): Payer: 59

## 2020-02-18 ENCOUNTER — Encounter (HOSPITAL_COMMUNITY): Payer: Self-pay

## 2020-02-22 ENCOUNTER — Ambulatory Visit (HOSPITAL_COMMUNITY)
Admission: RE | Admit: 2020-02-22 | Discharge: 2020-02-22 | Disposition: A | Discharge status: Home / Self Care | Payer: 59 | Source: Ambulatory Visit | Attending: Gastroenterology | Admitting: Gastroenterology

## 2020-02-22 ENCOUNTER — Other Ambulatory Visit: Payer: Self-pay

## 2020-02-22 DIAGNOSIS — K7460 Unspecified cirrhosis of liver: Secondary | ICD-10-CM | POA: Insufficient documentation

## 2020-02-22 DIAGNOSIS — K7469 Other cirrhosis of liver: Secondary | ICD-10-CM | POA: Diagnosis present

## 2020-02-29 ENCOUNTER — Encounter: Payer: Self-pay | Admitting: Internal Medicine

## 2020-02-29 ENCOUNTER — Other Ambulatory Visit: Payer: Self-pay

## 2020-02-29 ENCOUNTER — Ambulatory Visit (INDEPENDENT_AMBULATORY_CARE_PROVIDER_SITE_OTHER): Payer: 59 | Admitting: Internal Medicine

## 2020-02-29 ENCOUNTER — Other Ambulatory Visit (HOSPITAL_COMMUNITY)
Admission: RE | Admit: 2020-02-29 | Discharge: 2020-02-29 | Disposition: A | Discharge status: Home / Self Care | Payer: 59 | Source: Ambulatory Visit | Attending: Student in an Organized Health Care Education/Training Program | Admitting: Student in an Organized Health Care Education/Training Program

## 2020-02-29 VITALS — BP 97/66 | HR 77 | Temp 98.5°F | Ht 61.0 in | Wt 157.2 lb

## 2020-02-29 DIAGNOSIS — Z124 Encounter for screening for malignant neoplasm of cervix: Secondary | ICD-10-CM | POA: Diagnosis present

## 2020-02-29 DIAGNOSIS — Z23 Encounter for immunization: Secondary | ICD-10-CM | POA: Diagnosis not present

## 2020-02-29 DIAGNOSIS — I10 Essential (primary) hypertension: Secondary | ICD-10-CM

## 2020-02-29 DIAGNOSIS — R413 Other amnesia: Secondary | ICD-10-CM | POA: Diagnosis not present

## 2020-02-29 DIAGNOSIS — R630 Anorexia: Secondary | ICD-10-CM | POA: Diagnosis not present

## 2020-02-29 NOTE — Progress Notes (Signed)
CC: Pap smear, memory concerns, weight loss concerns, and HTN  HPI:  [name] is a 59 y.o. with the history listed below presenting for .ap smear, memory concerns, weight loss concerns, and HTN.   Past Medical History:  Diagnosis Date  . Bilateral arm pain    chronic  . Chronic hepatitis C ([place]) followed by dr comer at infectious disease   Genotype 1a--  currently on Harvoni started 09-13-2015  . Cirrhosis ([place])   . Dependence on crutches    left leg prosthesis  . Drug addiction in remission ([place])    since 2007  (crack cocaine,  IV drug use)  . External hemorrhoid, bleeding   . GERD (gastroesophageal reflux disease)   . Hand weakness   . History of esophageal stricture    post dilation x2  . History of osteosarcoma    1982  left knee---  s/p  AKA LLE--   per pt no recurrence  . History of recreational drug use multiple rehab visits   IV drug use for 1 yr and Smoked crack for 30+yrs--  per pt clean since 2007  . Hyperlipidemia   . Hypertension   . Idiopathic peripheral neuropathy   . Myocardial infarction ([place])   . Osteoarthritis   . osteosarcoma    age 72 surg only lt leg  . Prolapsed internal hemorrhoids, grade 4   . Shortness of breath   . Sigmoid diverticulosis    mild  . Sleep apnea    On CPAP  . Substance abuse ([place])    Clean for 14 years   Review of Systems:   Constitutional: Negative for chills and fever.  Respiratory: Negative for shortness of breath.   Cardiovascular: Negative for chest pain and leg swelling.  Gastrointestinal: Negative for abdominal pain, nausea and vomiting.  Neurological: Negative for dizziness and headaches.   Physical Exam:  Vitals:   02/29/20 1410  Weight: 157 lb 3.2 oz (71.3 kg)  Height: 5\' 1"  (1.549 m)   Physical Exam Exam conducted with a chaperone present.  HENT:     Head: Normocephalic and atraumatic.  Eyes:     Conjunctiva/sclera: Conjunctivae normal.     Pupils: Pupils are equal, round, and reactive to  light.  Neck:     Thyroid: No thyromegaly.  Cardiovascular:     Rate and Rhythm: Normal rate and regular rhythm.     Heart sounds: Normal heart sounds. No murmur heard.  No friction rub. No gallop.   Pulmonary:     Effort: Pulmonary effort is normal. No respiratory distress.     Breath sounds: Normal breath sounds. No wheezing.  Abdominal:     General: Bowel sounds are normal. There is no distension.     Palpations: Abdomen is soft.  Genitourinary:    General: Normal vulva.     Vagina: Normal.     Cervix: Normal.  Musculoskeletal:        General: Deformity (Left BKA) present. Normal range of motion.     Cervical back: Normal range of motion and neck supple.  Skin:    General: Skin is warm and dry.     Capillary Refill: Capillary refill takes less than 2 seconds.     Findings: No erythema.  Neurological:     General: No focal deficit present.     Mental Status: She is alert and oriented to person, place, and time.     Gait: Gait is intact.  Psychiatric:  Mood and Affect: Mood and affect normal.        Behavior: Behavior normal.      Assessment & Plan:   See Encounters Tab for problem based charting.  Patient discussed with Dr. Dareen Piano

## 2020-02-29 NOTE — Patient Instructions (Addendum)
Ms. Stacy Thomas,  It was a pleasure to see you today. Thank you for coming in.   Today we discussed your memory issues. The testing that we did looked okay but we will repeat it on follow up visits to monitor for now. I am checking some labs to evaluate this and will contact you if they are abnormal.   We also discussed your blood pressure. This was a little low today. Please make sure that you are staying hydrated and eating a healthy diet. In regards to your weight loss please monitor your weight at home and consider seeing how many calories you are eating in a day. You can use an app such as MyFitnessPal to check this.   Please return to clinic in 3 months or sooner if needed.   Thank you again for coming in.   [name].

## 2020-03-01 ENCOUNTER — Encounter: Payer: Self-pay | Admitting: Internal Medicine

## 2020-03-01 DIAGNOSIS — R413 Other amnesia: Secondary | ICD-10-CM | POA: Insufficient documentation

## 2020-03-01 DIAGNOSIS — R630 Anorexia: Secondary | ICD-10-CM | POA: Insufficient documentation

## 2020-03-01 HISTORY — DX: Other amnesia: R41.3

## 2020-03-01 LAB — HEMOGLOBIN A1C
Est. average glucose Bld gHb Est-mCnc: 123 mg/dL
Hgb A1c MFr Bld: 5.9 % — ABNORMAL HIGH (ref 4.8–5.6)

## 2020-03-01 LAB — CYTOLOGY - PAP
Comment: NEGATIVE
Diagnosis: NEGATIVE
High risk HPV: NEGATIVE

## 2020-03-01 LAB — TSH: TSH: 1.45 u[IU]/mL (ref 0.450–4.500)

## 2020-03-01 LAB — VITAMIN B12: Vitamin B-12: 584 pg/mL (ref 232–1245)

## 2020-03-01 NOTE — Assessment & Plan Note (Signed)
Patient is currently on Zestoretic 20-25. No issues taking medications. Denies lightheadedness, dizziness, fatigue, weakness, chest pain, or SOB..  BP Readings from Last 3 Encounters:  02/29/20 97/66  11/02/19 109/78  10/08/19 90/60   BP is well controlled. Patient always has lower blood pressures and is currently asymptomatic.   Last BMP showed normal kidney function and electrolytes.   -Continue Zestoretic 20-25 mg daily -Could consider decreasing on future visits if BP continues to be soft

## 2020-03-01 NOTE — Assessment & Plan Note (Signed)
Patient reports concern about her memory, she states that over the past year she has been more forgetful and has difficulty concentrating. She will sometimes forget what she is doing or where she is going. She reports that she is able to complete all her ADLs, including bathing, cooking, cleaning, eating, and managing her finances. She denies any issues with depression, difficulty sleeping, difficulty enjoying things, or her interest in things. She states that her husband has not expressed any concerns but she states he just doesn't pay attention. Physical exam unremarkable.  MoCA score was 21/30, missed points for delayed recall, serial 7s, and copying cube. When asked to recall words she stated she did not remember. Patient does fall into the mild cognitive impairment category. Symptoms appear to be mild at this time and is not having a significant impact on patients daily life. Will obtain labs to evaluate for potential secondary causes. Discussed with patient that we can repeat her MoCA in the future to trend her memory issues.  -TSH -B12

## 2020-03-01 NOTE — Assessment & Plan Note (Signed)
Obtained pap smear today.

## 2020-03-01 NOTE — Assessment & Plan Note (Signed)
Patient reports concern about weight loss of 3 lbs over the past 3 months, she does not check her weight at home however she does wear a prosthetic and states that it feels looser. She endorses decreased appetite over 1 month, she has been eating about 1 meal per day, down from 2 meals per day. She denies any dysphagia, nausea, vomiting, abdominal pain, diarrhea, constipation, headaches, chest pain, SOB, fevers, chills, or other symptoms. She was concerned about diabetes or thyroid issues. We discussed that 3 lbs is not a major change in her weight, and that weight can fluctuate a few pounds over the day based on her diet. Given her concern about decreased appetite we can obtain TSH and A1c to screen for 2/2 causes. Patient has a history of hepatic cirrhosis and follows with GI for this, had a RUQ ultrasound on 11/22 for monitoring that showed mild changes of cirrhosis.   -TSH, A1c -Provided reassurance, discussed that she can track her calories to ensure that she is eating enough to maintain her weight -Continue following with GI

## 2020-03-02 ENCOUNTER — Telehealth: Payer: Self-pay

## 2020-03-02 NOTE — Telephone Encounter (Signed)
Called pt and offered her the only Monday Dr. Havery Moros has currently available, which is a 10 am procedure, to arrive at 9:00am.  She indicated that she can only do an 8:00am appt on a Monday morning because her of her husband's job.  I told her I will continue to watch the schedule but I encouraged her that she shouldn't wait too long to be rescoped.  She expressed understanding.

## 2020-03-02 NOTE — Progress Notes (Signed)
Internal Medicine Clinic Attending ° °Case discussed with Dr. Krienke  At the time of the visit.  We reviewed the resident’s history and exam and pertinent patient test results.  I agree with the assessment, diagnosis, and plan of care documented in the resident’s note.  °

## 2020-03-02 NOTE — Telephone Encounter (Signed)
-----   Message from Roetta Sessions, [facility] sent at 02/08/2020 10:52 AM EST ----- Regarding: due for colon - Needs Monday am appt She is due for a colonoscopy in December 2021but she needs an early Monday morning appt ONLY.  Dr. Havery Moros does not have a early morning Monday appt in December. We will wait until January schedule comes out.

## 2020-03-03 ENCOUNTER — Other Ambulatory Visit: Payer: Self-pay | Admitting: Internal Medicine

## 2020-03-03 DIAGNOSIS — I10 Essential (primary) hypertension: Secondary | ICD-10-CM

## 2020-03-03 DIAGNOSIS — N1831 Chronic kidney disease, stage 3a: Secondary | ICD-10-CM

## 2020-03-08 ENCOUNTER — Ambulatory Visit: Payer: 59 | Admitting: Family Medicine

## 2020-03-09 ENCOUNTER — Telehealth: Payer: Self-pay

## 2020-03-09 ENCOUNTER — Ambulatory Visit (INDEPENDENT_AMBULATORY_CARE_PROVIDER_SITE_OTHER): Payer: 59 | Admitting: Family

## 2020-03-09 ENCOUNTER — Encounter: Payer: Self-pay | Admitting: Family

## 2020-03-09 DIAGNOSIS — M1711 Unilateral primary osteoarthritis, right knee: Secondary | ICD-10-CM

## 2020-03-09 DIAGNOSIS — M1611 Unilateral primary osteoarthritis, right hip: Secondary | ICD-10-CM | POA: Diagnosis not present

## 2020-03-09 DIAGNOSIS — Z8619 Personal history of other infectious and parasitic diseases: Secondary | ICD-10-CM

## 2020-03-09 DIAGNOSIS — K7460 Unspecified cirrhosis of liver: Secondary | ICD-10-CM

## 2020-03-09 MED ORDER — METHYLPREDNISOLONE ACETATE 40 MG/ML IJ SUSP
40.0000 mg | INTRAMUSCULAR | Status: AC | PRN
  Administered 2020-03-09: 40 mg via INTRA_ARTICULAR

## 2020-03-09 MED ORDER — LIDOCAINE HCL 1 % IJ SOLN
5.0000 mL | INTRAMUSCULAR | Status: AC | PRN
  Administered 2020-03-09: 5 mL

## 2020-03-09 NOTE — Telephone Encounter (Signed)
-----   Message from Roetta Sessions, [place] sent at 10/08/2019  8:52 AM EDT ----- Regarding: labs due AFP, CMET, INR and CBC due - cirrhosis

## 2020-03-09 NOTE — Progress Notes (Signed)
Office Visit Note   Patient: Stacy Thomas           Date of Birth: 1960-04-26           MRN: [id number] Visit Date: 03/09/2020              Requested by: Asencion Noble, MD [address] [place],  [place] [address] PCP: Asencion Noble, MD  No chief complaint on file.     HPI: Patient is a 59 year old woman who presents complaining of right knee pain. This has been worsening over last few weeks.  Constant aching pain worse with weightbearing denies any falls denies mechanical symptoms.  Patient has had chronic osteoarthritis of the right knee. Patient states she has had some flexogenic injections back in March of this year. Is interested in trying supplemental injections in future with our office.  Assessment & Plan: Visit Diagnoses:  No diagnosis found.  Plan: Depo-Medrol injection right knee today.  Patient tolerated well.  We will go ahead and get prior authorization for supplemental injection of the right knee she will follow-up for this injection when her knee begins to bother her again  Follow-Up Instructions: No follow-ups on file.   Right Knee Exam   Muscle Strength  The patient has normal right knee strength.  Tenderness  The patient is experiencing tenderness in the medial joint line and lateral joint line.  Range of Motion  The patient has normal right knee ROM.  Tests  Varus: negative Valgus: negative  Other  Erythema: absent Swelling: none Effusion: no effusion present      Patient is alert, oriented, no adenopathy, well-dressed, normal affect, normal respiratory effort.  Imaging: No results found. No images are attached to the encounter.  Labs: Lab Results  Component Value Date   HGBA1C 5.9 (H) 02/29/2020   HGBA1C 6.1 (A) 09/10/2019   HGBA1C 5.4 03/05/2016     Lab Results  Component Value Date   ALBUMIN 4.1 09/28/2019   ALBUMIN 4.4 09/09/2019   ALBUMIN 4.3 03/18/2019    No results found for: MG Lab Results  Component  Value Date   VD25OH 15.8 (L) 05/03/2016    No results found for: PREALBUMIN CBC EXTENDED Latest Ref Rng & Units 09/09/2019 03/18/2019 09/08/2018  WBC 4.0 - 10.5 K/uL 6.3 5.1 5.3  RBC 3.87 - 5.11 Mil/uL 4.59 4.67 4.86  HGB 12.0 - 15.0 g/dL 14.1 14.3 15.2  HCT 36 - 46 % 40.7 42.0 43.1  PLT 150 - 400 K/uL 254.0 202.0 228  NEUTROABS 1.4 - 7.7 K/uL 2.2 2.0 -  LYMPHSABS 0.7 - 4.0 K/uL 3.3 2.4 -     There is no height or weight on file to calculate BMI.  Orders:  No orders of the defined types were placed in this encounter.  No orders of the defined types were placed in this encounter.    Procedures: Large Joint Inj: R knee on 03/09/2020 10:29 AM Indications: pain Details: 18 G 1.5 in needle, anteromedial approach Medications: 5 mL lidocaine 1 %; 40 mg methylPREDNISolone acetate 40 MG/ML Consent was given by the patient.      Clinical Data: No additional findings.  ROS:  All other systems negative, except as noted in the HPI. Review of Systems  Constitutional: Negative for chills and fever.  Musculoskeletal: Positive for arthralgias. Negative for gait problem and joint swelling.  Neurological: Negative for weakness and numbness.    Objective: Vital Signs: LMP 01/22/2011   Specialty Comments:  No specialty comments available.  PMFS History: Patient Active Problem List   Diagnosis Date Noted  . Decreased appetite 03/01/2020  . Memory changes 03/01/2020  . CKD (chronic kidney disease) 09/11/2019  . HLD (hyperlipidemia) 09/09/2018  . Fatigue 09/08/2018  . Chronic cough 09/08/2018  . Allergic sinusitis 12/03/2017  . Osteoarthritis of right hip 12/03/2017  . Hx of AKA (above knee amputation), left ([place]) 02/18/2017  . Unilateral primary osteoarthritis, right knee 02/18/2017  . Aphthous ulcer of tongue 10/29/2016  . Vitamin D deficiency 05/04/2016  . Bilateral primary osteoarthritis of hip 04/26/2016  . Bilateral carpal tunnel syndrome 03/29/2016  . Paresthesia of  skin 03/23/2016  . Atrophic vaginitis 03/06/2016  . Screening for cervical cancer 03/06/2016  . PVD (peripheral vascular disease) ([place]) 03/05/2016  . Pancreatic abnormality 03/05/2016  . HTN (hypertension) 03/05/2016  . Hepatic cirrhosis ([place]) 11/15/2015  . External hemorrhoids with pain & bleeding 10/27/2015  . Chronic hepatitis C ([place]) 06/15/2015  . Transaminitis 06/14/2015  . Prolapsed internal hemorrhoids, grade 4 06/14/2015  . Peripheral neuropathy 06/14/2015  . Unilateral AKA ([place])- left 06/14/2015  . Hx of osteosarcoma 06/14/2015  . DYSPHAGIA 06/13/2007  . ABDOMINAL PAIN-LUQ 06/13/2007   Past Medical History:  Diagnosis Date  . Bilateral arm pain    chronic  . Chronic hepatitis C ([place]) followed by dr comer at infectious disease   Genotype 1a--  currently on Harvoni started 09-13-2015  . Cirrhosis ([place])   . Dependence on crutches    left leg prosthesis  . Drug addiction in remission ([place])    since 2007  (crack cocaine,  IV drug use)  . External hemorrhoid, bleeding   . GERD (gastroesophageal reflux disease)   . Hand weakness   . History of esophageal stricture    post dilation x2  . History of osteosarcoma    1982  left knee---  s/p  AKA LLE--   per pt no recurrence  . History of recreational drug use multiple rehab visits   IV drug use for 1 yr and Smoked crack for 30+yrs--  per pt clean since 2007  . Hyperlipidemia   . Hypertension   . Idiopathic peripheral neuropathy   . Myocardial infarction ([place])   . Osteoarthritis   . osteosarcoma    age 64 surg only lt leg  . Prolapsed internal hemorrhoids, grade 4   . Shortness of breath   . Sigmoid diverticulosis    mild  . Sleep apnea    On CPAP  . Substance abuse ([place])    Clean for 14 years    Family History  Problem Relation Age of Onset  . COPD Father   . Alcohol abuse Father        Deceased  . Hypertension Mother        Living  . Healthy Daughter   . Healthy Son   . Anesthesia problems Neg Hx   .  Breast cancer Neg Hx   . Colon cancer Neg Hx   . Esophageal cancer Neg Hx   . Rectal cancer Neg Hx   . Stomach cancer Neg Hx     Past Surgical History:  Procedure Laterality Date  . ABOVE KNEE LEG AMPUTATION Left 1982   osteosarcoma  . COLONOSCOPY  last one 02-28-2009  . ESOPHAGOGASTRODUODENOSCOPY (EGD) WITH ESOPHAGEAL DILATION  x2  last one 04-28-2008  . EXCISION BREAST BX  2015   CYST  . TONSILLECTOMY  as child  . TRANSANAL HEMORRHOIDAL DEARTERIALIZATION N/A 10/27/2015  Procedure: TRANSANAL HEMORRHOIDAL DEARTERIALIZATION;  Surgeon: Michael Boston, MD;  Location: [facility];  Service: General;  Laterality: N/A;   Social History   Occupational History  . Not on file  Tobacco Use  . Smoking status: Former Smoker    Packs/day: 1.00    Years: 37.00    Pack years: 37.00    Types: Cigarettes    Start date: 04/02/1973    Quit date: 12/19/2014    Years since quitting: 5.2  . Smokeless tobacco: Never Used  Vaping Use  . Vaping Use: Never used  Substance and Sexual Activity  . Alcohol use: No    Alcohol/week: 0.0 standard drinks  . Drug use: No    Types: "Crack" cocaine    Comment: recovery addict since 2007  . Sexual activity: Not on file

## 2020-03-09 NOTE — Telephone Encounter (Signed)
Called and spoke to pt. Informed her that she is due for labs for Dr. Havery Moros in December. Orders are in. She indicated she will go tomorrow to have labs done.

## 2020-03-10 ENCOUNTER — Encounter: Payer: Self-pay | Admitting: Family Medicine

## 2020-03-10 ENCOUNTER — Ambulatory Visit (INDEPENDENT_AMBULATORY_CARE_PROVIDER_SITE_OTHER): Payer: 59 | Admitting: Family Medicine

## 2020-03-10 ENCOUNTER — Ambulatory Visit: Payer: Self-pay

## 2020-03-10 ENCOUNTER — Other Ambulatory Visit (INDEPENDENT_AMBULATORY_CARE_PROVIDER_SITE_OTHER): Payer: 59

## 2020-03-10 ENCOUNTER — Other Ambulatory Visit: Payer: Self-pay

## 2020-03-10 DIAGNOSIS — Z8619 Personal history of other infectious and parasitic diseases: Secondary | ICD-10-CM | POA: Diagnosis not present

## 2020-03-10 DIAGNOSIS — M1611 Unilateral primary osteoarthritis, right hip: Secondary | ICD-10-CM

## 2020-03-10 DIAGNOSIS — K7460 Unspecified cirrhosis of liver: Secondary | ICD-10-CM

## 2020-03-10 LAB — CBC WITH DIFFERENTIAL/PLATELET
Basophils Absolute: 0.1 10*3/uL (ref 0.0–0.1)
Basophils Relative: 0.8 % (ref 0.0–3.0)
Eosinophils Absolute: 0 10*3/uL (ref 0.0–0.7)
Eosinophils Relative: 0 % (ref 0.0–5.0)
HCT: 42 % (ref 36.0–46.0)
Hemoglobin: 14 g/dL (ref 12.0–15.0)
Lymphocytes Relative: 16.1 % (ref 12.0–46.0)
Lymphs Abs: 1.8 10*3/uL (ref 0.7–4.0)
MCHC: 33.4 g/dL (ref 30.0–36.0)
MCV: 88.3 fl (ref 78.0–100.0)
Monocytes Absolute: 0.7 10*3/uL (ref 0.1–1.0)
Monocytes Relative: 6.4 % (ref 3.0–12.0)
Neutro Abs: 8.6 10*3/uL — ABNORMAL HIGH (ref 1.4–7.7)
Neutrophils Relative %: 76.7 % (ref 43.0–77.0)
Platelets: 250 10*3/uL (ref 150.0–400.0)
RBC: 4.75 Mil/uL (ref 3.87–5.11)
RDW: 13.5 % (ref 11.5–15.5)
WBC: 11.2 10*3/uL — ABNORMAL HIGH (ref 4.0–10.5)

## 2020-03-10 LAB — COMPREHENSIVE METABOLIC PANEL
ALT: 44 U/L — ABNORMAL HIGH (ref 0–35)
AST: 46 U/L — ABNORMAL HIGH (ref 0–37)
Albumin: 4.7 g/dL (ref 3.5–5.2)
Alkaline Phosphatase: 107 U/L (ref 39–117)
BUN: 28 mg/dL — ABNORMAL HIGH (ref 6–23)
CO2: 24 mEq/L (ref 19–32)
Calcium: 10.2 mg/dL (ref 8.4–10.5)
Chloride: 103 mEq/L (ref 96–112)
Creatinine, Ser: 1.08 mg/dL (ref 0.40–1.20)
GFR: 55.51 mL/min — ABNORMAL LOW (ref >60.00)
Glucose, Bld: 120 mg/dL — ABNORMAL HIGH (ref 70–99)
Potassium: 3.8 mEq/L (ref 3.5–5.1)
Sodium: 138 mEq/L (ref 135–145)
Total Bilirubin: 0.7 mg/dL (ref 0.2–1.2)
Total Protein: 8.4 g/dL — ABNORMAL HIGH (ref 6.0–8.3)

## 2020-03-10 LAB — PROTIME-INR
INR: 1.2 ratio — ABNORMAL HIGH (ref 0.8–1.0)
Prothrombin Time: 13.9 s — ABNORMAL HIGH (ref 9.6–13.1)

## 2020-03-10 NOTE — Progress Notes (Signed)
Office Visit Note   Patient: Stacy Thomas           Date of Birth: 1961/03/20           MRN: [id number] Visit Date: 03/10/2020 Requested by: Asencion Noble, MD [address],   [address] PCP: Asencion Noble, MD  Subjective: Chief Complaint  Patient presents with  . Right Hip - Pain    Requests another cortisone injection - last had in the Spring. Worked well. Had the the right knee injected with cortisone yesterday by Junie Panning.    HPI: She is here with recurrent right hip pain.  She has done well with injections, her last one was in April.  She would like another.  She is trying to avoid surgery.                ROS:   All other systems were reviewed and are negative.  Objective: Vital Signs: LMP 01/22/2011   Physical Exam:  General:  Alert and oriented, in no acute distress. Pulm:  Breathing unlabored. Psy:  Normal mood, congruent affect.  Pain with passive IR.  Imaging: US Guided Needle Placement - No Linked Charges  Result Date: 03/10/2020 Ultrasound guided injection is preferred based studies that show increased duration, increased effect, greater accuracy, decreased procedural pain, increased response rate, and decreased cost with ultrasound guided versus blind injection.   Verbal informed consent obtained.  Time-out conducted.  Noted no overlying erythema, induration, or other signs of local infection. Ultrasound-guided right hip injection: After sterile prep with Betadine, injected 4 cc 0.25% bupivocaine without epinephrine and 6 mg betamethasone using a 22-gauge spinal needle, passing the needle through the iliofemoral ligament into the femoral head/neck junction.  Injectate seen filling joint capsule.     Assessment & Plan: 1.  Right hip DJD -Injection given as above.  Follow-up as needed.     Procedures: No procedures performed        PMFS History: Patient Active Problem List   Diagnosis Date Noted  . Decreased appetite 03/01/2020   . Memory changes 03/01/2020  . CKD (chronic kidney disease) 09/11/2019  . HLD (hyperlipidemia) 09/09/2018  . Fatigue 09/08/2018  . Chronic cough 09/08/2018  . Allergic sinusitis 12/03/2017  . Osteoarthritis of right hip 12/03/2017  . Hx of AKA (above knee amputation), left ([place]) 02/18/2017  . Unilateral primary osteoarthritis, right knee 02/18/2017  . Aphthous ulcer of tongue 10/29/2016  . Vitamin D deficiency 05/04/2016  . Bilateral primary osteoarthritis of hip 04/26/2016  . Bilateral carpal tunnel syndrome 03/29/2016  . Paresthesia of skin 03/23/2016  . Atrophic vaginitis 03/06/2016  . Screening for cervical cancer 03/06/2016  . PVD (peripheral vascular disease) ([place]) 03/05/2016  . Pancreatic abnormality 03/05/2016  . HTN (hypertension) 03/05/2016  . Hepatic cirrhosis ([place]) 11/15/2015  . External hemorrhoids with pain & bleeding 10/27/2015  . Chronic hepatitis C ([place]) 06/15/2015  . Transaminitis 06/14/2015  . Prolapsed internal hemorrhoids, grade 4 06/14/2015  . Peripheral neuropathy 06/14/2015  . Unilateral AKA ([place])- left 06/14/2015  . Hx of osteosarcoma 06/14/2015  . DYSPHAGIA 06/13/2007  . ABDOMINAL PAIN-LUQ 06/13/2007   Past Medical History:  Diagnosis Date  . Bilateral arm pain    chronic  . Chronic hepatitis C ([place]) followed by dr comer at infectious disease   Genotype 1a--  currently on Harvoni started 09-13-2015  . Cirrhosis ([place])   . Dependence on crutches    left leg prosthesis  . Drug addiction in remission (  [place])    since 2007  (crack cocaine,  IV drug use)  . External hemorrhoid, bleeding   . GERD (gastroesophageal reflux disease)   . Hand weakness   . History of esophageal stricture    post dilation x2  . History of osteosarcoma    1982  left knee---  s/p  AKA LLE--   per pt no recurrence  . History of recreational drug use multiple rehab visits   IV drug use for 1 yr and Smoked crack for 30+yrs--  per pt clean since 2007  . Hyperlipidemia   .  Hypertension   . Idiopathic peripheral neuropathy   . Myocardial infarction ([place])   . Osteoarthritis   . osteosarcoma    age 28 surg only lt leg  . Prolapsed internal hemorrhoids, grade 4   . Shortness of breath   . Sigmoid diverticulosis    mild  . Sleep apnea    On CPAP  . Substance abuse ([place])    Clean for 14 years    Family History  Problem Relation Age of Onset  . COPD Father   . Alcohol abuse Father        Deceased  . Hypertension Mother        Living  . Healthy Daughter   . Healthy Son   . Anesthesia problems Neg Hx   . Breast cancer Neg Hx   . Colon cancer Neg Hx   . Esophageal cancer Neg Hx   . Rectal cancer Neg Hx   . Stomach cancer Neg Hx     Past Surgical History:  Procedure Laterality Date  . ABOVE KNEE LEG AMPUTATION Left 1982   osteosarcoma  . COLONOSCOPY  last one 02-28-2009  . ESOPHAGOGASTRODUODENOSCOPY (EGD) WITH ESOPHAGEAL DILATION  x2  last one 04-28-2008  . EXCISION BREAST BX  2015   CYST  . TONSILLECTOMY  as child  . TRANSANAL HEMORRHOIDAL DEARTERIALIZATION N/A 10/27/2015   Procedure: TRANSANAL HEMORRHOIDAL DEARTERIALIZATION;  Surgeon: Arliss Hepburn Boston, MD;  Location: [facility];  Service: General;  Laterality: N/A;   Social History   Occupational History  . Not on file  Tobacco Use  . Smoking status: Former Smoker    Packs/day: 1.00    Years: 37.00    Pack years: 37.00    Types: Cigarettes    Start date: 04/02/1973    Quit date: 12/19/2014    Years since quitting: 5.2  . Smokeless tobacco: Never Used  Vaping Use  . Vaping Use: Never used  Substance and Sexual Activity  . Alcohol use: No    Alcohol/week: 0.0 standard drinks  . Drug use: No    Types: "Crack" cocaine    Comment: recovery addict since 2007  . Sexual activity: Not on file

## 2020-03-11 ENCOUNTER — Telehealth: Payer: Self-pay | Admitting: Gastroenterology

## 2020-03-11 LAB — AFP TUMOR MARKER: AFP-Tumor Marker: 6.4 ng/mL — ABNORMAL HIGH

## 2020-03-11 NOTE — Telephone Encounter (Signed)
Pt is requesting a call back from a nurse to discuss her lab results.

## 2020-03-14 ENCOUNTER — Encounter: Payer: Self-pay | Admitting: Gastroenterology

## 2020-03-14 NOTE — Telephone Encounter (Signed)
Pt needs call back to explain results please

## 2020-03-14 NOTE — Telephone Encounter (Signed)
LM for pt.  Dr. Havery Moros sent her a MyChart message regarding her lab results. Requested her to check MyChart and let us know if she has any questions.

## 2020-03-15 NOTE — Telephone Encounter (Signed)
Called and spoke to pt.  She is feeling a little tired.  She may decide to reach out to her PCP but she was able to get scheduled for her colonoscopy in December.  Will await those results.   All questions answered.

## 2020-03-18 ENCOUNTER — Ambulatory Visit (AMBULATORY_SURGERY_CENTER): Payer: Self-pay

## 2020-03-18 ENCOUNTER — Other Ambulatory Visit: Payer: Self-pay

## 2020-03-18 VITALS — Ht 61.0 in | Wt 154.0 lb

## 2020-03-18 DIAGNOSIS — Z86010 Personal history of colonic polyps: Secondary | ICD-10-CM

## 2020-03-18 MED ORDER — PLENVU 140 G PO SOLR: 1.0000 | Freq: Once | ORAL | 0 refills | Status: AC

## 2020-03-18 NOTE — Progress Notes (Signed)
No egg or soy allergy known to patient  No issues with past sedation with any surgeries or procedures No intubation problems in the past  No FH of Malignant Hyperthermia No diet pills per patient No home 02 use per patient  No blood thinners per patient  Pt denies issues with constipation  No A fib or A flutter  EMMI video to pt or via [place] 19 guidelines implemented in PV today with Pt and RN  Pt is fully vaccinated  for Covid   Plenvu  Coupon given to pt in PV today , Code to Pharmacy   Due to the COVID-19 pandemic we are asking patients to follow certain guidelines.  Pt aware of COVID protocols and [facility] guidelines

## 2020-04-22 ENCOUNTER — Encounter: Payer: Self-pay | Admitting: Gastroenterology

## 2020-04-28 ENCOUNTER — Encounter: Payer: Self-pay | Admitting: Gastroenterology

## 2020-04-28 ENCOUNTER — Ambulatory Visit (AMBULATORY_SURGERY_CENTER): Payer: BC Managed Care – PPO | Admitting: Gastroenterology

## 2020-04-28 ENCOUNTER — Other Ambulatory Visit: Payer: Self-pay

## 2020-04-28 VITALS — BP 111/76 | HR 72 | Temp 96.8°F | Resp 20 | Ht 61.0 in | Wt 154.0 lb

## 2020-04-28 DIAGNOSIS — K635 Polyp of colon: Secondary | ICD-10-CM

## 2020-04-28 DIAGNOSIS — D127 Benign neoplasm of rectosigmoid junction: Secondary | ICD-10-CM | POA: Diagnosis not present

## 2020-04-28 DIAGNOSIS — K6289 Other specified diseases of anus and rectum: Secondary | ICD-10-CM

## 2020-04-28 DIAGNOSIS — Z8719 Personal history of other diseases of the digestive system: Secondary | ICD-10-CM | POA: Diagnosis not present

## 2020-04-28 DIAGNOSIS — K6282 Dysplasia of anus: Secondary | ICD-10-CM | POA: Diagnosis not present

## 2020-04-28 DIAGNOSIS — Z86010 Personal history of colonic polyps: Secondary | ICD-10-CM

## 2020-04-28 MED ORDER — SODIUM CHLORIDE 0.9 % IV SOLN: 500.0000 mL | Freq: Once | INTRAVENOUS | Status: DC

## 2020-04-28 NOTE — Op Note (Signed)
[facility] Patient Name: Stacy Thomas Procedure Date: 04/28/2020 8:26 AM MRN: [id number] Endoscopist: Remo Lipps P. Havery Moros , MD Age: 60 Referring MD:  Date of Birth: [birth year] Gender: Female Account #: [id number] Procedure:                Colonoscopy Indications:              High risk colon cancer surveillance: Personal                            history of colonic polyps and ascending colon                            subepithelial nodule noted on colonoscopy 03/2019 Medicines:                Monitored Anesthesia Care Procedure:                Pre-Anesthesia Assessment:                           - Prior to the procedure, a History and Physical                            was performed, and patient medications and                            allergies were reviewed. The patient's tolerance of                            previous anesthesia was also reviewed. The risks                            and benefits of the procedure and the sedation                            options and risks were discussed with the patient.                            All questions were answered, and informed consent                            was obtained. Prior Anticoagulants: The patient has                            taken no previous anticoagulant or antiplatelet                            agents. ASA Grade Assessment: III - A patient with                            severe systemic disease. After reviewing the risks                            and benefits, the patient was deemed in  satisfactory condition to undergo the procedure.                           After obtaining informed consent, the colonoscope                            was passed under direct vision. Throughout the                            procedure, the patient's blood pressure, pulse, and                            oxygen saturations were monitored continuously. The                            Olympus  PCF-H190DL (#[id number]) Colonoscope was                            introduced through the anus and advanced to the the                            cecum, identified by appendiceal orifice and                            ileocecal valve. The colonoscopy was performed                            without difficulty. The patient tolerated the                            procedure well. The quality of the bowel                            preparation was good. The ileocecal valve,                            appendiceal orifice, and rectum were photographed. Scope In: 8:29:40 AM Scope Out: 8:59:35 AM Scope Withdrawal Time: 0 hours 23 minutes 12 seconds  Total Procedure Duration: 0 hours 29 minutes 55 seconds  Findings:                 The perianal and digital rectal examinations were                            normal.                           One 5 to 6 mm subepithelial nodule was found in the                            distal ascending colon, proximal to area of prior                            tattoo. Similar in appearance compared to one year  ago. Bite on bite biopsies were taken with a cold                            forceps for histology.                           A few small-mouthed diverticula were found in the                            sigmoid colon.                           A 4 mm polyp was found in the recto-sigmoid colon.                            The polyp was sessile. The polyp was removed with a                            cold snare. Resection and retrieval were complete.                           Anal papilla(e) was hypertrophied vs. prolapsed                            hemorrhoid, with some inflammatory changes noted at                            the base of it. Biopsies were taken with a cold                            forceps for histology.                           Internal hemorrhoids were found during retroflexion.                           There  was scarring in the rectum, similar in                            appearance compared to prior exam. Colon was                            tortous. The exam was otherwise without abnormality. Complications:            No immediate complications. Estimated blood loss:                            Minimal. Estimated Blood Loss:     Estimated blood loss was minimal. Impression:               - Subepithelial nodule in the distal ascending                            colon, grossly unchanged in appearance over the  past year. Biopsied.                           - Diverticulosis in the sigmoid colon.                           - 63mm rectosigmoid polyp removed via cold snare.                           - Anal papilla(e) were hypertrophied vs. prolapsed                            hemorrhoid. Biopsied to rule out AIN.                           - Internal hemorrhoids.                           - Rectal scarring                           - Tortous colon                           - The examination was otherwise normal. Recommendation:           - Patient has a contact number available for                            emergencies. The signs and symptoms of potential                            delayed complications were discussed with the                            patient. Return to normal activities tomorrow.                            Written discharge instructions were provided to the                            patient.                           - Resume previous diet.                           - Continue present medications.                           - Await pathology results. Remo Lipps P. Armbruster, MD 04/28/2020 9:13:14 AM This report has been signed electronically.

## 2020-04-28 NOTE — Progress Notes (Signed)
VS by MO   I have reviewed the patient's medical history in detail and updated the computerized patient record.  

## 2020-04-28 NOTE — Progress Notes (Signed)
pt tolerated well. VSS. awake and to recovery. Report given to RN.  

## 2020-04-28 NOTE — Patient Instructions (Signed)
Handouts given for Diverticulosis and polyps.  YOU HAD AN ENDOSCOPIC PROCEDURE TODAY AT [facility]:   Refer to the procedure report that was given to you for any specific questions about what was found during the examination.  If the procedure report does not answer your questions, please call your gastroenterologist to clarify.  If you requested that your care partner not be given the details of your procedure findings, then the procedure report has been included in a sealed envelope for you to review at your convenience later.  YOU SHOULD EXPECT: Some feelings of bloating in the abdomen. Passage of more gas than usual.  Walking can help get rid of the air that was put into your GI tract during the procedure and reduce the bloating. If you had a lower endoscopy (such as a colonoscopy or flexible sigmoidoscopy) you may notice spotting of blood in your stool or on the toilet paper. If you underwent a bowel prep for your procedure, you may not have a normal bowel movement for a few days.  Please Note:  You might notice some irritation and congestion in your nose or some drainage.  This is from the oxygen used during your procedure.  There is no need for concern and it should clear up in a day or so.  SYMPTOMS TO REPORT IMMEDIATELY:   Following lower endoscopy (colonoscopy or flexible sigmoidoscopy):  Excessive amounts of blood in the stool  Significant tenderness or worsening of abdominal pains  Swelling of the abdomen that is new, acute  Fever of 100F or higher  For urgent or emergent issues, a gastroenterologist can be reached at any hour by calling [phone]. Do not use MyChart messaging for urgent concerns.    DIET:  We do recommend a small meal at first, but then you may proceed to your regular diet.  Drink plenty of fluids but you should avoid alcoholic beverages for 24 hours.  ACTIVITY:  You should plan to take it easy for the rest of today and you should NOT DRIVE  or use heavy machinery until tomorrow (because of the sedation medicines used during the test).    FOLLOW UP: Our staff will call the number listed on your records 48-72 hours following your procedure to check on you and address any questions or concerns that you may have regarding the information given to you following your procedure. If we do not reach you, we will leave a message.  We will attempt to reach you two times.  During this call, we will ask if you have developed any symptoms of COVID 19. If you develop any symptoms (ie: fever, flu-like symptoms, shortness of breath, cough etc.) before then, please call [phone].  If you test positive for Covid 19 in the 2 weeks post procedure, please call and report this information to [place].    If any biopsies were taken you will be contacted by phone or by letter within the next 1-3 weeks.  Please call us at [phone] if you have not heard about the biopsies in 3 weeks.    SIGNATURES/CONFIDENTIALITY: You and/or your care partner have signed paperwork which will be entered into your electronic medical record.  These signatures attest to the fact that that the information above on your After Visit Summary has been reviewed and is understood.  Full responsibility of the confidentiality of this discharge information lies with you and/or your care-partner.

## 2020-04-29 DIAGNOSIS — L668 Other cicatricial alopecia: Secondary | ICD-10-CM | POA: Diagnosis not present

## 2020-05-02 ENCOUNTER — Telehealth: Payer: Self-pay

## 2020-05-02 NOTE — Telephone Encounter (Signed)
Covid-19 screening questions   Do you now or have you had a fever in the last 14 days? No.  Do you have any respiratory symptoms of shortness of breath or cough now or in the last 14 days? No.  Do you have any family members or close contacts with diagnosed or suspected Covid-19 in the past 14 days? No.  Have you been tested for Covid-19 and found to be positive? No.       Follow up Call-  Call back number 04/28/2020 11/02/2019 03/23/2019  Post procedure Call Back phone  # [phone]  Permission to leave phone message Yes Yes Yes  Some recent data might be hidden     Patient questions:  Do you have a fever, pain , or abdominal swelling? No. Pain Score  0 *  Have you tolerated food without any problems? Yes.    Have you been able to return to your normal activities? Yes.    Do you have any questions about your discharge instructions: Diet   No. Medications  No. Follow up visit  No.  Do you have questions or concerns about your Care? No.  Actions: * If pain score is 4 or above: No action needed, pain <4.

## 2020-06-02 ENCOUNTER — Telehealth: Payer: Self-pay

## 2020-06-02 NOTE — Telephone Encounter (Signed)
Patient called regarding a wheelchair she would like a call [phone]

## 2020-06-06 ENCOUNTER — Telehealth: Payer: Self-pay | Admitting: Orthopedic Surgery

## 2020-06-06 ENCOUNTER — Encounter: Payer: Self-pay | Admitting: Orthopedic Surgery

## 2020-06-06 NOTE — Telephone Encounter (Signed)
Pt sent message in [facility] asking for Dr. Sharol Given to sign off on order for wheelchair will close this message and respond to [employer],

## 2020-06-06 NOTE — Telephone Encounter (Signed)
Pt called stating Dr. Sharol Given ordered a wheel chair for her but hasn't signed off and she would like a CB ASAP from [place] as she has to give her info.   [phone]

## 2020-06-06 NOTE — Telephone Encounter (Signed)
Duplicate message. 

## 2020-06-07 ENCOUNTER — Other Ambulatory Visit: Payer: Self-pay | Admitting: Student

## 2020-06-07 ENCOUNTER — Ambulatory Visit: Payer: Self-pay | Admitting: General Surgery

## 2020-06-07 DIAGNOSIS — I10 Essential (primary) hypertension: Secondary | ICD-10-CM

## 2020-06-07 DIAGNOSIS — N1831 Chronic kidney disease, stage 3a: Secondary | ICD-10-CM

## 2020-06-07 DIAGNOSIS — K6282 Dysplasia of anus: Secondary | ICD-10-CM | POA: Diagnosis not present

## 2020-06-07 NOTE — H&P (Signed)
The patient is a 60 year old female who presents with anal lesions. 60 year old female with history of transanal hemorrhoidal pexy who presents to the office for evaluation of an anal papilla which was biopsied during recent colonoscopy. This showed AIN grade 1. Patient denies any anal pain or rectal bleeding. She does note a hemorrhoid that occasionally prolapses.   Past Surgical History Janeann Forehand, CNA; 06/07/2020 11:42 AM) Cesarean Section - 1 Colon Polyp Removal - Colonoscopy Colon Polyp Removal - Open  Diagnostic Studies History Janeann Forehand, CNA; 06/07/2020 11:42 AM) Mammogram 1-3 years ago within last year  Allergies Janeann Forehand, CNA; 06/07/2020 11:42 AM) Codeine Sulfate *ANALGESICS - OPIOID* Morphine Sulfate (Concentrate) *ANALGESICS - OPIOID* Allergies Reconciled  Medication History Janeann Forehand, CNA; 06/07/2020 11:42 AM) Anusol-HC (2.5% Cream, 1 (one) Cream Rectal twice daily, as needed, Taken starting 03/09/2015) Active. Pravastatin Sodium (20MG  Tablet, Oral) Active. Triamterene-HCTZ (37.5-25MG  Tablet, Oral) Active. OxyCODONE HCl (5MG  Tablet, Oral as needed) Active. Gabapentin (300MG  Capsule, Oral daily) Active. Medications Reconciled  Social History Janeann Forehand, CNA; 06/07/2020 11:42 AM) Caffeine use Coffee. Illicit drug use Prefer to discuss with provider. No alcohol use  Family History Janeann Forehand, CNA; 06/07/2020 11:42 AM) Family history unknown First Degree Relatives  Pregnancy / Birth History Janeann Forehand, CNA; 06/07/2020 11:42 AM) Age at menarche 62 years, 78 years. Age of menopause 60-50 Gravida 20 3 Maternal age 46-20 Para 37  Other Problems Janeann Forehand, CNA; 06/07/2020 11:42 AM) High blood pressure Hypercholesterolemia Sleep Apnea     Review of Systems [facility] CNA; 06/07/2020 11:42 AM) General Not Present- Appetite Loss, Chills, Fatigue, Fever, Night Sweats, Weight Gain and Weight  Loss. Skin Not Present- Change in Wart/Mole, Dryness, Hives, Jaundice, New Lesions, Non-Healing Wounds, Rash and Ulcer. HEENT Present- Ringing in the Ears and Wears glasses/contact lenses. Not Present- Earache, Hearing Loss, Hoarseness, Nose Bleed, Oral Ulcers, Seasonal Allergies, Sinus Pain, Sore Throat, Visual Disturbances and Yellow Eyes. Respiratory Not Present- Bloody sputum, Chronic Cough, Difficulty Breathing, Snoring and Wheezing. Breast Not Present- Breast Mass, Breast Pain, Nipple Discharge and Skin Changes. Cardiovascular Not Present- Chest Pain, Difficulty Breathing Lying Down, Leg Cramps, Palpitations, Rapid Heart Rate, Shortness of Breath and Swelling of Extremities. Gastrointestinal Not Present- Abdominal Pain, Bloating, Bloody Stool, Change in Bowel Habits, Chronic diarrhea, Constipation, Difficulty Swallowing, Excessive gas, Gets full quickly at meals, Hemorrhoids, Indigestion, Nausea, Rectal Pain and Vomiting. Female Genitourinary Not Present- Frequency, Nocturia, Painful Urination, Pelvic Pain and Urgency. Musculoskeletal Not Present- Back Pain, Joint Pain, Joint Stiffness, Muscle Pain, Muscle Weakness and Swelling of Extremities. Neurological Present- Numbness. Not Present- Decreased Memory, Fainting, Headaches, Seizures, Tingling, Tremor, Trouble walking and Weakness. Endocrine Present- Hair Changes. Not Present- Cold Intolerance, Excessive Hunger, Heat Intolerance, Hot flashes and New Diabetes.  Vitals (Donyelle Alston CNA; 06/07/2020 11:43 AM) 06/07/2020 11:42 AM Weight: 156 lb Height: 61in Body Surface Area: 1.7 m Body Mass Index: 29.48 kg/m  Temp.: 96.66F  Pulse: 103 (Regular)  P.OX: 94% (Room air) BP: 140/68(Sitting, Left Arm, Standard)        Physical Exam Leighton Ruff MD; 10/07/4126 11:58 AM)  General Mental Status-Alert. General Appearance-Cooperative.  Rectal Note: Noninflamed right anterior external hemorrhoid, mild anal  stenosis.   Results Leighton Ruff MD; 10/07/6765 11:59 AM) Procedures  Name Value Date ANOSCOPY, DIAGNOSTIC (20947) [ Hemorrhoids ] Procedure Other: Procedure: Anoscopy....Marland KitchenMarland KitchenSurgeon: Marcello Moores....Marland KitchenMarland KitchenAfter the risks and benefits were explained, verbal consent was obtained for above procedure. A medical assistant chaperone was present thoroughout the entire procedure. ....Marland KitchenMarland KitchenAnesthesia: none....Marland KitchenMarland KitchenDiagnosis: AIN....Marland KitchenMarland KitchenFindings: There appears  to be an anal papilla at posterior midline. Difficult to visualize entire anal canal due to stool.  Performed: 06/07/2020 11:58 AM    Assessment & Plan Leighton Ruff MD; 08/01/8982 11:58 AM)  AIN GRADE I (K10.31) Impression: 61 year old female who underwent recent colonoscopy. Biopsy of an anal papilla was performed. This showed AIN grade 1. On exam today, this appears to be in posterior midline. We discussed monitoring this in the office with serial exams versus excision. Patient would like to proceed with excision. We will work on getting this scheduled.

## 2020-06-16 ENCOUNTER — Telehealth: Payer: Self-pay

## 2020-06-16 ENCOUNTER — Ambulatory Visit (INDEPENDENT_AMBULATORY_CARE_PROVIDER_SITE_OTHER): Payer: BC Managed Care – PPO | Admitting: Orthopedic Surgery

## 2020-06-16 ENCOUNTER — Other Ambulatory Visit: Payer: Self-pay

## 2020-06-16 ENCOUNTER — Encounter: Payer: Self-pay | Admitting: Orthopedic Surgery

## 2020-06-16 DIAGNOSIS — M25551 Pain in right hip: Secondary | ICD-10-CM

## 2020-06-16 DIAGNOSIS — Z89612 Acquired absence of left leg above knee: Secondary | ICD-10-CM | POA: Diagnosis not present

## 2020-06-16 DIAGNOSIS — M1711 Unilateral primary osteoarthritis, right knee: Secondary | ICD-10-CM

## 2020-06-16 NOTE — Telephone Encounter (Signed)
Right knee synvisc right knee OA s/p injection 1 year ago.

## 2020-06-16 NOTE — Progress Notes (Signed)
Office Visit Note   Patient: Stacy Thomas           Date of Birth: 1960/11/03           MRN: [id number] Visit Date: 06/16/2020              Requested by: Asencion Noble, MD [address],  [place] [id number] PCP: Asencion Noble, MD  Chief Complaint  Patient presents with  . Left Leg - Follow-up    Hx AKA      HPI: Patient is a 60 year old woman who was seen for a face-to-face visit for mobility assessment for a power wheelchair.  Assessment & Plan: Visit Diagnoses:  1. S/P AKA (above knee amputation), left ([place])   2. Unilateral primary osteoarthritis, right knee   3. Right hip pain     Plan: Paperwork completed for power wheelchair.  Patient will need the power chair to assist with her home activities of daily living such as cooking cleaning and toileting.  Patient has the mental capacity to operate a power wheelchair safely and is willing to use this in her home.  Follow-Up Instructions: Return if symptoms worsen or fail to improve.   Ortho Exam  Patient is alert, oriented, no adenopathy, well-dressed, normal affect, normal respiratory effort. On examination patient has an antalgic gait she has a prosthesis without knee motion and limps with the left lower extremity prosthesis for her above-knee amputation.  Patient has developed increasing arthritic pain and weakness in her hands from using crutches for ambulation.  She has osteoarthritis of her right knee which is required steroid injections as well as hyaluronic acid injections and also has degenerative arthritis of the right hip as well.  Patient's weight is 154 pounds, height 5 foot 1.  PT evaluation has been reviewed and I concur the patient will benefit from a powered wheelchair.  Patient does have fine motor skills of her hands and is able to use the electronic controls of a power wheelchair but she does not have the strength to use crutches.  Imaging: No results found. No images are attached to the  encounter.  Labs: Lab Results  Component Value Date   HGBA1C 5.9 (H) 02/29/2020   HGBA1C 6.1 (A) 09/10/2019   HGBA1C 5.4 03/05/2016     Lab Results  Component Value Date   ALBUMIN 4.7 03/10/2020   ALBUMIN 4.1 09/28/2019   ALBUMIN 4.4 09/09/2019    No results found for: MG Lab Results  Component Value Date   VD25OH 15.8 (L) 05/03/2016    No results found for: PREALBUMIN CBC EXTENDED Latest Ref Rng & Units 03/10/2020 09/09/2019 03/18/2019  WBC 4.0 - 10.5 K/uL 11.2(H) 6.3 5.1  RBC 3.87 - 5.11 Mil/uL 4.75 4.59 4.67  HGB 12.0 - 15.0 g/dL 14.0 14.1 14.3  HCT 36.0 - 46.0 % 42.0 40.7 42.0  PLT 150.0 - 400.0 K/uL 250.0 254.0 202.0  NEUTROABS 1.4 - 7.7 K/uL 8.6(H) 2.2 2.0  LYMPHSABS 0.7 - 4.0 K/uL 1.8 3.3 2.4     There is no height or weight on file to calculate BMI.  Orders:  No orders of the defined types were placed in this encounter.  No orders of the defined types were placed in this encounter.    Procedures: No procedures performed  Clinical Data: No additional findings.  ROS:  All other systems negative, except as noted in the HPI. Review of Systems  Objective: Vital Signs: LMP 01/22/2011   Specialty Comments:  No specialty comments available.  PMFS History: Patient Active Problem List   Diagnosis Date Noted  . Decreased appetite 03/01/2020  . Memory changes 03/01/2020  . CKD (chronic kidney disease) 09/11/2019  . HLD (hyperlipidemia) 09/09/2018  . Fatigue 09/08/2018  . Chronic cough 09/08/2018  . Allergic sinusitis 12/03/2017  . Osteoarthritis of right hip 12/03/2017  . Hx of AKA (above knee amputation), left ([place]) 02/18/2017  . Unilateral primary osteoarthritis, right knee 02/18/2017  . Aphthous ulcer of tongue 10/29/2016  . Vitamin D deficiency 05/04/2016  . Bilateral primary osteoarthritis of hip 04/26/2016  . Bilateral carpal tunnel syndrome 03/29/2016  . Paresthesia of skin 03/23/2016  . Atrophic vaginitis 03/06/2016  . Screening for  cervical cancer 03/06/2016  . PVD (peripheral vascular disease) ([place]) 03/05/2016  . Pancreatic abnormality 03/05/2016  . HTN (hypertension) 03/05/2016  . Hepatic cirrhosis ([place]) 11/15/2015  . External hemorrhoids with pain & bleeding 10/27/2015  . Chronic hepatitis C ([place]) 06/15/2015  . Transaminitis 06/14/2015  . Prolapsed internal hemorrhoids, grade 4 06/14/2015  . Peripheral neuropathy 06/14/2015  . Unilateral AKA ([place])- left 06/14/2015  . Hx of osteosarcoma 06/14/2015  . DYSPHAGIA 06/13/2007  . ABDOMINAL PAIN-LUQ 06/13/2007   Past Medical History:  Diagnosis Date  . Allergy   . Bilateral arm pain    chronic  . Chronic hepatitis C ([place]) followed by dr comer at infectious disease   Genotype 1a--  currently on Harvoni started 09-13-2015  . Cirrhosis ([place])   . Dependence on crutches    left leg prosthesis  . Drug addiction in remission ([place])    since 2007  (crack cocaine,  IV drug use)  . External hemorrhoid, bleeding   . GERD (gastroesophageal reflux disease)   . Hand weakness   . History of esophageal stricture    post dilation x2  . History of osteosarcoma    1982  left knee---  s/p  AKA LLE--   per pt no recurrence  . History of recreational drug use multiple rehab visits   IV drug use for 1 yr and Smoked crack for 30+yrs--  per pt clean since 2007  . Hyperlipidemia   . Hypertension   . Idiopathic peripheral neuropathy   . Myocardial infarction ([place])   . Osteoarthritis   . osteosarcoma    age 88 surg only lt leg, osteosarcoma  . Prolapsed internal hemorrhoids, grade 4   . Shortness of breath   . Sigmoid diverticulosis    mild  . Sleep apnea    On CPAP  . Substance abuse ([place])    Clean, since 2006    Family History  Problem Relation Age of Onset  . COPD Father   . Alcohol abuse Father        Deceased  . Hypertension Mother        Living  . Healthy Daughter   . Healthy Son   . Anesthesia problems Neg Hx   . Breast cancer Neg Hx   . Colon cancer Neg Hx    . Esophageal cancer Neg Hx   . Rectal cancer Neg Hx   . Stomach cancer Neg Hx   . Colon polyps Neg Hx     Past Surgical History:  Procedure Laterality Date  . ABOVE KNEE LEG AMPUTATION Left 1982   osteosarcoma  . COLONOSCOPY  last one 02-28-2009  . ESOPHAGOGASTRODUODENOSCOPY (EGD) WITH ESOPHAGEAL DILATION  x2  last one 04-28-2008  . EXCISION BREAST BX  2015   CYST  . TONSILLECTOMY  as child  . TRANSANAL HEMORRHOIDAL DEARTERIALIZATION N/A 10/27/2015   Procedure: TRANSANAL HEMORRHOIDAL DEARTERIALIZATION;  Surgeon: Michael Boston, MD;  Location: [facility];  Service: General;  Laterality: N/A;  . UPPER GASTROINTESTINAL ENDOSCOPY     Social History   Occupational History  . Not on file  Tobacco Use  . Smoking status: Former Smoker    Packs/day: 1.00    Years: 37.00    Pack years: 37.00    Types: Cigarettes    Start date: 04/02/1973    Quit date: 12/19/2014    Years since quitting: 5.4  . Smokeless tobacco: Never Used  Vaping Use  . Vaping Use: Never used  Substance and Sexual Activity  . Alcohol use: Never    Alcohol/week: 0.0 standard drinks  . Drug use: No    Types: "Crack" cocaine    Comment: recovery addict since 2006  . Sexual activity: Not on file

## 2020-06-17 NOTE — Telephone Encounter (Signed)
Noted  

## 2020-07-07 ENCOUNTER — Ambulatory Visit: Payer: BC Managed Care – PPO | Admitting: Physician Assistant

## 2020-07-07 ENCOUNTER — Other Ambulatory Visit: Payer: Self-pay

## 2020-07-07 ENCOUNTER — Encounter (HOSPITAL_BASED_OUTPATIENT_CLINIC_OR_DEPARTMENT_OTHER): Payer: Self-pay | Admitting: General Surgery

## 2020-07-07 ENCOUNTER — Encounter: Payer: Self-pay | Admitting: Orthopedic Surgery

## 2020-07-07 DIAGNOSIS — M1711 Unilateral primary osteoarthritis, right knee: Secondary | ICD-10-CM

## 2020-07-07 MED ORDER — METHYLPREDNISOLONE ACETATE 40 MG/ML IJ SUSP
40.0000 mg | INTRAMUSCULAR | Status: AC | PRN
  Administered 2020-07-07: 40 mg via INTRA_ARTICULAR

## 2020-07-07 MED ORDER — LIDOCAINE HCL 1 % IJ SOLN
5.0000 mL | INTRAMUSCULAR | Status: AC | PRN
  Administered 2020-07-07: 5 mL

## 2020-07-07 NOTE — Progress Notes (Signed)
Spoke w/ via phone for pre-op interview--- PT Lab needs dos----  Istat and EKG             Lab results------ no COVID test ------ 07-11-2020 @ 7858 Arrive at -------  0800 on 07-14-2020 NPO after MN NO Solid Food.  Clear liquids from MN until--- 0700 Med rec completed Medications to take morning of surgery ----- Lipitor Diabetic medication ----- n/a Patient instructed to bring photo id and insurance card day of surgery Patient aware to have Driver (ride ) / caregiver    for 24 hours after surgery--- husband, Stacy Thomas  Patient Special Instructions ----- n/a Pre-Op special Istructions ----- pt has prosthetic left leg, uses crutches Patient verbalized understanding of instructions that were given at this phone interview. Patient denies shortness of breath, chest pain, fever, cough at this phone interview.   Anesthesia :   HTN;  Hx hepatitis c, cured since 2017 after completed harvoni;  Mild cirrohosis due to hx chronic hepatitis c, asymptomatic;  Hx osteosarcoma left leg s/p aka 1982, no recurrence;  CKD 3; OSA no cpap , intolerant.  Pt denies any cardiac s&s and sob.  PCP:  Dr Santina Evans (loc 02-29-2020 epic) Cardiologist : no GI:  Dr Havery Moros  ([place] 10-08-2019 epic) EKG : 09-12-2015 MRI abd:  08-17-2019 [place] abd:  02-22-2020 epic Echo :  no Stress test: no Cardiac Cath :  no Activity level:  Denies sob w/ activity Sleep Study/ CPAP :  YES/ NO Fasting Blood Sugar :      / Checks Blood Sugar -- times a day:  Pre-diabetic Blood Thinner/ Instructions /Last Dose:  NO ASA / Instructions/ Last Dose :  NO

## 2020-07-07 NOTE — Progress Notes (Signed)
Office Visit Note   Patient: Stacy Thomas           Date of Birth: 23-Jun-1960           MRN: [id number] Visit Date: 07/07/2020              Requested by: Asencion Noble, MD [address],  [place] [address] PCP: Asencion Noble, MD  Chief Complaint  Patient presents with  . Right Knee - Pain      HPI: Presents today for right knee cortisone injection.  She has had those in the past most recently in December and they help her.  She also has a history of a left above-knee amputation.  We are in the process of obtaining approval for motorized scooter for her as she has been using crutches for 45 years and its made it very difficult for her as her hands are beginning to go numb  Assessment & Plan: Visit Diagnoses: No diagnosis found.  Plan: We will pursue scooter injection follow-up as needed  Follow-Up Instructions: No follow-ups on file.   Ortho Exam  Patient is alert, oriented, no adenopathy, well-dressed, normal affect, normal respiratory effort.  Right knee no effusion no cellulitis healed surgical portals tenderness over the medial lateral joint line  Imaging: No results found. No images are attached to the encounter.  Labs: Lab Results  Component Value Date   HGBA1C 5.9 (H) 02/29/2020   HGBA1C 6.1 (A) 09/10/2019   HGBA1C 5.4 03/05/2016     Lab Results  Component Value Date   ALBUMIN 4.7 03/10/2020   ALBUMIN 4.1 09/28/2019   ALBUMIN 4.4 09/09/2019    No results found for: MG Lab Results  Component Value Date   VD25OH 15.8 (L) 05/03/2016    No results found for: PREALBUMIN CBC EXTENDED Latest Ref Rng & Units 03/10/2020 09/09/2019 03/18/2019  WBC 4.0 - 10.5 K/uL 11.2(H) 6.3 5.1  RBC 3.87 - 5.11 Mil/uL 4.75 4.59 4.67  HGB 12.0 - 15.0 g/dL 14.0 14.1 14.3  HCT 36.0 - 46.0 % 42.0 40.7 42.0  PLT 150.0 - 400.0 K/uL 250.0 254.0 202.0  NEUTROABS 1.4 - 7.7 K/uL 8.6(H) 2.2 2.0  LYMPHSABS 0.7 - 4.0 K/uL 1.8 3.3 2.4     There is no height or  weight on file to calculate BMI.  Orders:  No orders of the defined types were placed in this encounter.  No orders of the defined types were placed in this encounter.    Procedures: Large Joint Inj: R knee on 07/07/2020 9:22 AM Indications: pain and diagnostic evaluation Details: 22 G 1.5 in needle, anterolateral approach  Arthrogram: No  Medications: 40 mg methylPREDNISolone acetate 40 MG/ML; 5 mL lidocaine 1 % Outcome: tolerated well, no immediate complications Procedure, treatment alternatives, risks and benefits explained, specific risks discussed. Consent was given by the patient.      Clinical Data: No additional findings.  ROS:  All other systems negative, except as noted in the HPI. Review of Systems  Objective: Vital Signs: LMP 01/22/2011   Specialty Comments:  No specialty comments available.  PMFS History: Patient Active Problem List   Diagnosis Date Noted  . Decreased appetite 03/01/2020  . Memory changes 03/01/2020  . CKD (chronic kidney disease) 09/11/2019  . HLD (hyperlipidemia) 09/09/2018  . Fatigue 09/08/2018  . Chronic cough 09/08/2018  . Allergic sinusitis 12/03/2017  . Osteoarthritis of right hip 12/03/2017  . Hx of AKA (above knee amputation), left ([place]) 02/18/2017  .  Unilateral primary osteoarthritis, right knee 02/18/2017  . Aphthous ulcer of tongue 10/29/2016  . Vitamin D deficiency 05/04/2016  . Bilateral primary osteoarthritis of hip 04/26/2016  . Bilateral carpal tunnel syndrome 03/29/2016  . Paresthesia of skin 03/23/2016  . Atrophic vaginitis 03/06/2016  . Screening for cervical cancer 03/06/2016  . PVD (peripheral vascular disease) ([place]) 03/05/2016  . Pancreatic abnormality 03/05/2016  . HTN (hypertension) 03/05/2016  . Hepatic cirrhosis ([place]) 11/15/2015  . External hemorrhoids with pain & bleeding 10/27/2015  . Chronic hepatitis C ([place]) 06/15/2015  . Transaminitis 06/14/2015  . Prolapsed internal hemorrhoids, grade 4  06/14/2015  . Peripheral neuropathy 06/14/2015  . Unilateral AKA ([place])- left 06/14/2015  . Hx of osteosarcoma 06/14/2015  . DYSPHAGIA 06/13/2007  . ABDOMINAL PAIN-LUQ 06/13/2007   Past Medical History:  Diagnosis Date  . Allergy   . Bilateral arm pain    chronic  . Chronic hepatitis C ([place]) followed by dr comer at infectious disease   Genotype 1a--  currently on Harvoni started 09-13-2015  . Cirrhosis ([place])   . Dependence on crutches    left leg prosthesis  . Drug addiction in remission ([place])    since 2007  (crack cocaine,  IV drug use)  . External hemorrhoid, bleeding   . GERD (gastroesophageal reflux disease)   . Hand weakness   . History of esophageal stricture    post dilation x2  . History of osteosarcoma    1982  left knee---  s/p  AKA LLE--   per pt no recurrence  . History of recreational drug use multiple rehab visits   IV drug use for 1 yr and Smoked crack for 30+yrs--  per pt clean since 2007  . Hyperlipidemia   . Hypertension   . Idiopathic peripheral neuropathy   . Myocardial infarction ([place])   . Osteoarthritis   . osteosarcoma    age 60 surg only lt leg, osteosarcoma  . Prolapsed internal hemorrhoids, grade 4   . Shortness of breath   . Sigmoid diverticulosis    mild  . Sleep apnea    On CPAP  . Substance abuse ([place])    Clean, since 2006    Family History  Problem Relation Age of Onset  . COPD Father   . Alcohol abuse Father        Deceased  . Hypertension Mother        Living  . Healthy Daughter   . Healthy Son   . Anesthesia problems Neg Hx   . Breast cancer Neg Hx   . Colon cancer Neg Hx   . Esophageal cancer Neg Hx   . Rectal cancer Neg Hx   . Stomach cancer Neg Hx   . Colon polyps Neg Hx     Past Surgical History:  Procedure Laterality Date  . ABOVE KNEE LEG AMPUTATION Left 1982   osteosarcoma  . COLONOSCOPY  last one 02-28-2009  . ESOPHAGOGASTRODUODENOSCOPY (EGD) WITH ESOPHAGEAL DILATION  x2  last one 04-28-2008  . EXCISION BREAST  BX  2015   CYST  . TONSILLECTOMY  as child  . TRANSANAL HEMORRHOIDAL DEARTERIALIZATION N/A 10/27/2015   Procedure: TRANSANAL HEMORRHOIDAL DEARTERIALIZATION;  Surgeon: Michael Boston, MD;  Location[facility];  Service: General;  Laterality: N/A;  . UPPER GASTROINTESTINAL ENDOSCOPY     Social History   Occupational History  . Not on file  Tobacco Use  . Smoking status: Former Smoker    Packs/day: 1.00    Years: 37.00  Pack years: 37.00    Types: Cigarettes    Start date: 04/02/1973    Quit date: 12/19/2014    Years since quitting: 5.5  . Smokeless tobacco: Never Used  Vaping Use  . Vaping Use: Never used  Substance and Sexual Activity  . Alcohol use: Never    Alcohol/week: 0.0 standard drinks  . Drug use: No    Types: "Crack" cocaine    Comment: recovery addict since 2006  . Sexual activity: Not on file

## 2020-07-11 ENCOUNTER — Other Ambulatory Visit (HOSPITAL_COMMUNITY)
Admission: RE | Admit: 2020-07-11 | Discharge: 2020-07-11 | Disposition: A | Discharge status: Home / Self Care | Payer: BC Managed Care – PPO | Source: Ambulatory Visit | Attending: General Surgery | Admitting: General Surgery

## 2020-07-11 DIAGNOSIS — Z885 Allergy status to narcotic agent status: Secondary | ICD-10-CM | POA: Diagnosis not present

## 2020-07-11 DIAGNOSIS — Z20822 Contact with and (suspected) exposure to covid-19: Secondary | ICD-10-CM | POA: Insufficient documentation

## 2020-07-11 DIAGNOSIS — K6282 Dysplasia of anus: Secondary | ICD-10-CM | POA: Diagnosis not present

## 2020-07-11 DIAGNOSIS — Z01812 Encounter for preprocedural laboratory examination: Secondary | ICD-10-CM | POA: Insufficient documentation

## 2020-07-11 LAB — SARS CORONAVIRUS 2 (TAT 6-24 HRS): SARS Coronavirus 2: NEGATIVE

## 2020-07-14 ENCOUNTER — Encounter (HOSPITAL_BASED_OUTPATIENT_CLINIC_OR_DEPARTMENT_OTHER)
Admission: RE | Disposition: A | Discharge status: Home / Self Care | Payer: Self-pay | Source: Home / Self Care | Attending: General Surgery

## 2020-07-14 ENCOUNTER — Ambulatory Visit (HOSPITAL_BASED_OUTPATIENT_CLINIC_OR_DEPARTMENT_OTHER)
Admission: RE | Admit: 2020-07-14 | Discharge: 2020-07-14 | Disposition: A | Discharge status: Home / Self Care | Payer: BC Managed Care – PPO | Attending: General Surgery | Admitting: General Surgery

## 2020-07-14 ENCOUNTER — Encounter (HOSPITAL_BASED_OUTPATIENT_CLINIC_OR_DEPARTMENT_OTHER): Payer: Self-pay | Admitting: General Surgery

## 2020-07-14 ENCOUNTER — Ambulatory Visit (HOSPITAL_BASED_OUTPATIENT_CLINIC_OR_DEPARTMENT_OTHER): Payer: BC Managed Care – PPO | Admitting: Anesthesiology

## 2020-07-14 ENCOUNTER — Other Ambulatory Visit: Payer: Self-pay

## 2020-07-14 DIAGNOSIS — Z20822 Contact with and (suspected) exposure to covid-19: Secondary | ICD-10-CM | POA: Diagnosis not present

## 2020-07-14 DIAGNOSIS — E559 Vitamin D deficiency, unspecified: Secondary | ICD-10-CM | POA: Diagnosis not present

## 2020-07-14 DIAGNOSIS — K6289 Other specified diseases of anus and rectum: Secondary | ICD-10-CM | POA: Diagnosis not present

## 2020-07-14 DIAGNOSIS — K449 Diaphragmatic hernia without obstruction or gangrene: Secondary | ICD-10-CM | POA: Diagnosis not present

## 2020-07-14 DIAGNOSIS — G4730 Sleep apnea, unspecified: Secondary | ICD-10-CM | POA: Diagnosis not present

## 2020-07-14 DIAGNOSIS — Z885 Allergy status to narcotic agent status: Secondary | ICD-10-CM | POA: Insufficient documentation

## 2020-07-14 DIAGNOSIS — K6282 Dysplasia of anus: Secondary | ICD-10-CM | POA: Diagnosis not present

## 2020-07-14 HISTORY — DX: Personal history of other infectious and parasitic diseases: Z86.19

## 2020-07-14 HISTORY — DX: Obstructive sleep apnea (adult) (pediatric): G47.33

## 2020-07-14 HISTORY — DX: Diaphragmatic hernia without obstruction or gangrene: K44.9

## 2020-07-14 HISTORY — PX: EXCISION OF SKIN TAG: SHX6270

## 2020-07-14 HISTORY — DX: Prediabetes: R73.03

## 2020-07-14 HISTORY — DX: Cyst of pancreas: K86.2

## 2020-07-14 LAB — POCT I-STAT, CHEM 8
BUN: 20 mg/dL (ref 8–23)
Calcium, Ion: 1.15 mmol/L (ref 1.15–1.40)
Chloride: 106 mmol/L (ref 98–111)
Creatinine, Ser: 1 mg/dL (ref 0.44–1.00)
Glucose, Bld: 95 mg/dL (ref 70–99)
HCT: 40 % (ref 36.0–46.0)
Hemoglobin: 13.6 g/dL (ref 12.0–15.0)
Potassium: 3.8 mmol/L (ref 3.5–5.1)
Sodium: 142 mmol/L (ref 135–145)
TCO2: 23 mmol/L (ref 22–32)

## 2020-07-14 SURGERY — EXAM UNDER ANESTHESIA
Anesthesia: General | Site: Rectum

## 2020-07-14 MED ORDER — PROPOFOL 500 MG/50ML IV EMUL
INTRAVENOUS | Status: DC | PRN
  Administered 2020-07-14: 100 ug/kg/min via INTRAVENOUS

## 2020-07-14 MED ORDER — ACETAMINOPHEN 500 MG PO TABS
1000.0000 mg | ORAL_TABLET | ORAL | Status: AC
  Administered 2020-07-14: 1000 mg via ORAL

## 2020-07-14 MED ORDER — OXYCODONE HCL 5 MG/5ML PO SOLN: 5.0000 mg | Freq: Once | ORAL | Status: DC | PRN

## 2020-07-14 MED ORDER — SODIUM CHLORIDE 0.9 % IV SOLN: INTRAVENOUS | Status: DC

## 2020-07-14 MED ORDER — GLYCOPYRROLATE PF 0.2 MG/ML IJ SOSY
PREFILLED_SYRINGE | INTRAMUSCULAR | Status: AC
  Filled 2020-07-14: qty 1

## 2020-07-14 MED ORDER — ONDANSETRON HCL 4 MG/2ML IJ SOLN
INTRAMUSCULAR | Status: DC | PRN
  Administered 2020-07-14: 4 mg via INTRAVENOUS

## 2020-07-14 MED ORDER — PHENYLEPHRINE 40 MCG/ML (10ML) SYRINGE FOR IV PUSH (FOR BLOOD PRESSURE SUPPORT)
PREFILLED_SYRINGE | INTRAVENOUS | Status: DC | PRN
  Administered 2020-07-14: 80 ug via INTRAVENOUS

## 2020-07-14 MED ORDER — ACETIC ACID 5 % SOLN
Status: DC | PRN
  Administered 2020-07-14: 1 via TOPICAL

## 2020-07-14 MED ORDER — OXYCODONE HCL 5 MG PO TABS: 5.0000 mg | ORAL_TABLET | Freq: Once | ORAL | Status: DC | PRN

## 2020-07-14 MED ORDER — FENTANYL CITRATE (PF) 100 MCG/2ML IJ SOLN
INTRAMUSCULAR | Status: DC | PRN
  Administered 2020-07-14: 25 ug via INTRAVENOUS

## 2020-07-14 MED ORDER — LIDOCAINE 5 % EX OINT
TOPICAL_OINTMENT | CUTANEOUS | Status: DC | PRN
  Administered 2020-07-14: 1

## 2020-07-14 MED ORDER — SODIUM CHLORIDE 0.9% FLUSH: 3.0000 mL | Freq: Two times a day (BID) | INTRAVENOUS | Status: DC

## 2020-07-14 MED ORDER — ONDANSETRON HCL 4 MG/2ML IJ SOLN
INTRAMUSCULAR | Status: AC
  Filled 2020-07-14: qty 2

## 2020-07-14 MED ORDER — KETAMINE HCL 50 MG/5ML IJ SOSY
PREFILLED_SYRINGE | INTRAMUSCULAR | Status: AC
  Filled 2020-07-14: qty 5

## 2020-07-14 MED ORDER — MEPERIDINE HCL 25 MG/ML IJ SOLN: 6.2500 mg | INTRAMUSCULAR | Status: DC | PRN

## 2020-07-14 MED ORDER — SODIUM CHLORIDE 0.9% FLUSH: 3.0000 mL | INTRAVENOUS | Status: DC | PRN

## 2020-07-14 MED ORDER — ACETAMINOPHEN 325 MG PO TABS: 325.0000 mg | ORAL_TABLET | ORAL | Status: DC | PRN

## 2020-07-14 MED ORDER — KETAMINE HCL 10 MG/ML IJ SOLN
INTRAMUSCULAR | Status: DC | PRN
  Administered 2020-07-14: 30 mg via INTRAVENOUS
  Administered 2020-07-14: 10 mg via INTRAVENOUS

## 2020-07-14 MED ORDER — SODIUM CHLORIDE 0.9 % IV SOLN: 250.0000 mL | INTRAVENOUS | Status: DC | PRN

## 2020-07-14 MED ORDER — FENTANYL CITRATE (PF) 100 MCG/2ML IJ SOLN
INTRAMUSCULAR | Status: AC
  Filled 2020-07-14: qty 2

## 2020-07-14 MED ORDER — MIDAZOLAM HCL 2 MG/2ML IJ SOLN
INTRAMUSCULAR | Status: AC
  Filled 2020-07-14: qty 2

## 2020-07-14 MED ORDER — ONDANSETRON HCL 4 MG/2ML IJ SOLN: 4.0000 mg | Freq: Once | INTRAMUSCULAR | Status: DC | PRN

## 2020-07-14 MED ORDER — PROPOFOL 500 MG/50ML IV EMUL
INTRAVENOUS | Status: AC
  Filled 2020-07-14: qty 50

## 2020-07-14 MED ORDER — ACETAMINOPHEN 160 MG/5ML PO SOLN: 325.0000 mg | ORAL | Status: DC | PRN

## 2020-07-14 MED ORDER — PHENYLEPHRINE 40 MCG/ML (10ML) SYRINGE FOR IV PUSH (FOR BLOOD PRESSURE SUPPORT)
PREFILLED_SYRINGE | INTRAVENOUS | Status: AC
  Filled 2020-07-14: qty 10

## 2020-07-14 MED ORDER — ACETAMINOPHEN 500 MG PO TABS
ORAL_TABLET | ORAL | Status: AC
  Filled 2020-07-14: qty 2

## 2020-07-14 MED ORDER — ACETAMINOPHEN 500 MG PO TABS
ORAL_TABLET | ORAL | Status: AC
  Filled 2020-07-14: qty 1

## 2020-07-14 MED ORDER — PROPOFOL 10 MG/ML IV BOLUS
INTRAVENOUS | Status: DC | PRN
  Administered 2020-07-14 (×2): 20 mg via INTRAVENOUS

## 2020-07-14 MED ORDER — MIDAZOLAM HCL 5 MG/5ML IJ SOLN
INTRAMUSCULAR | Status: DC | PRN
  Administered 2020-07-14: 2 mg via INTRAVENOUS

## 2020-07-14 MED ORDER — BUPIVACAINE-EPINEPHRINE 0.5% -1:200000 IJ SOLN
INTRAMUSCULAR | Status: DC | PRN
  Administered 2020-07-14: 30 mL

## 2020-07-14 MED ORDER — ACETAMINOPHEN 325 MG PO TABS: 650.0000 mg | ORAL_TABLET | ORAL | Status: DC | PRN

## 2020-07-14 MED ORDER — GLYCOPYRROLATE PF 0.2 MG/ML IJ SOSY
PREFILLED_SYRINGE | INTRAMUSCULAR | Status: DC | PRN
  Administered 2020-07-14: .2 mg via INTRAVENOUS

## 2020-07-14 MED ORDER — ACETAMINOPHEN 325 MG RE SUPP: 650.0000 mg | RECTAL | Status: DC | PRN

## 2020-07-14 MED ORDER — FENTANYL CITRATE (PF) 100 MCG/2ML IJ SOLN: 25.0000 ug | INTRAMUSCULAR | Status: DC | PRN

## 2020-07-14 SURGICAL SUPPLY — 64 items
ADH SKN CLS APL DERMABOND .7 (GAUZE/BANDAGES/DRESSINGS) ×2
APL PRP STRL LF DISP 70% ISPRP (MISCELLANEOUS)
APL SKNCLS STERI-STRIP NONHPOA (GAUZE/BANDAGES/DRESSINGS) ×2
BENZOIN TINCTURE PRP APPL 2/3 (GAUZE/BANDAGES/DRESSINGS) ×3 IMPLANT
BLADE CLIPPER SENSICLIP SURGIC (BLADE) IMPLANT
BLADE EXTENDED COATED 6.5IN (ELECTRODE) IMPLANT
BLADE HEX COATED 2.75 (ELECTRODE) ×3 IMPLANT
BLADE SURG 10 STRL SS (BLADE) IMPLANT
BLADE SURG 15 STRL LF DISP TIS (BLADE) IMPLANT
BLADE SURG 15 STRL SS (BLADE)
BRIEF STRETCH FOR OB PAD LRG (UNDERPADS AND DIAPERS) ×3 IMPLANT
CANISTER SUCT 3000ML PPV (MISCELLANEOUS) ×3 IMPLANT
CHLORAPREP W/TINT 26 (MISCELLANEOUS) IMPLANT
COVER BACK TABLE 60X90IN (DRAPES) ×3 IMPLANT
COVER MAYO STAND STRL (DRAPES) ×3 IMPLANT
COVER WAND RF STERILE (DRAPES) ×3 IMPLANT
DECANTER SPIKE VIAL GLASS SM (MISCELLANEOUS) IMPLANT
DERMABOND ADVANCED (GAUZE/BANDAGES/DRESSINGS) ×1
DERMABOND ADVANCED .7 DNX12 (GAUZE/BANDAGES/DRESSINGS) ×2 IMPLANT
DRAPE HYSTEROSCOPY (MISCELLANEOUS) IMPLANT
DRAPE LAPAROTOMY 100X72 PEDS (DRAPES) ×3 IMPLANT
DRAPE SHEET LG 3/4 BI-LAMINATE (DRAPES) IMPLANT
DRAPE UTILITY XL STRL (DRAPES) ×3 IMPLANT
DRSG PAD ABDOMINAL 8X10 ST (GAUZE/BANDAGES/DRESSINGS) ×3 IMPLANT
DRSG TEGADERM 4X4.75 (GAUZE/BANDAGES/DRESSINGS) IMPLANT
ELECT REM PT RETURN 9FT ADLT (ELECTROSURGICAL) ×3
ELECTRODE REM PT RTRN 9FT ADLT (ELECTROSURGICAL) ×2 IMPLANT
GAUZE SPONGE 4X4 12PLY STRL (GAUZE/BANDAGES/DRESSINGS) IMPLANT
GAUZE SPONGE 4X4 12PLY STRL LF (GAUZE/BANDAGES/DRESSINGS) ×1 IMPLANT
GLOVE SURG ENC MOIS LTX SZ6.5 (GLOVE) ×3 IMPLANT
GLOVE SURG UNDER POLY LF SZ7 (GLOVE) ×3 IMPLANT
GOWN STRL REUS W/TWL XL LVL3 (GOWN DISPOSABLE) ×3 IMPLANT
HYDROGEN PEROXIDE 16OZ (MISCELLANEOUS) IMPLANT
IV CATH 14GX2 1/4 (CATHETERS) IMPLANT
IV CATH 18G SAFETY (IV SOLUTION) IMPLANT
KIT SIGMOIDOSCOPE (SET/KITS/TRAYS/PACK) IMPLANT
KIT TURNOVER CYSTO (KITS) ×3 IMPLANT
LEGGING LITHOTOMY PAIR STRL (DRAPES) IMPLANT
LOOP VESSEL MAXI BLUE (MISCELLANEOUS) IMPLANT
NEEDLE HYPO 22GX1.5 SAFETY (NEEDLE) ×3 IMPLANT
NS IRRIG 500ML POUR BTL (IV SOLUTION) ×3 IMPLANT
PACK BASIN DAY SURGERY FS (CUSTOM PROCEDURE TRAY) ×3 IMPLANT
PAD ARMBOARD 7.5X6 YLW CONV (MISCELLANEOUS) ×3 IMPLANT
PAD PREP 24X48 CUFFED NSTRL (MISCELLANEOUS) IMPLANT
PENCIL SMOKE EVACUATOR (MISCELLANEOUS) ×3 IMPLANT
SPONGE HEMORRHOID 8X3CM (HEMOSTASIS) IMPLANT
SPONGE SURGIFOAM ABS GEL 100 (HEMOSTASIS) IMPLANT
SPONGE SURGIFOAM ABS GEL 12-7 (HEMOSTASIS) IMPLANT
STRIP CLOSURE SKIN 1/2X4 (GAUZE/BANDAGES/DRESSINGS) IMPLANT
SUT CHROMIC 2 0 SH (SUTURE) ×1 IMPLANT
SUT CHROMIC 3 0 SH 27 (SUTURE) IMPLANT
SUT ETHIBOND 0 (SUTURE) IMPLANT
SUT VIC AB 2-0 SH 27 (SUTURE)
SUT VIC AB 2-0 SH 27XBRD (SUTURE) IMPLANT
SUT VIC AB 3-0 SH 27 (SUTURE)
SUT VIC AB 3-0 SH 27XBRD (SUTURE) IMPLANT
SYR 10ML LL (SYRINGE) IMPLANT
SYR CONTROL 10ML LL (SYRINGE) ×3 IMPLANT
TOWEL OR 17X26 10 PK STRL BLUE (TOWEL DISPOSABLE) ×3 IMPLANT
TRAY DSU PREP LF (CUSTOM PROCEDURE TRAY) ×3 IMPLANT
TUBE CONNECTING 12X1/4 (SUCTIONS) ×3 IMPLANT
UNDERPAD 30X36 HEAVY ABSORB (UNDERPADS AND DIAPERS) ×3 IMPLANT
WATER STERILE IRR 1000ML POUR (IV SOLUTION) ×3 IMPLANT
YANKAUER SUCT BULB TIP NO VENT (SUCTIONS) ×3 IMPLANT

## 2020-07-14 NOTE — Anesthesia Preprocedure Evaluation (Addendum)
Anesthesia Evaluation  Patient identified by MRN, date of birth, ID band Patient awake  General Assessment Comment:S/P left BKA secondary to bone cancer 35 years ago.  Chronic pain issues in neck, back, stump, right hand. Took neurontin today.  Reviewed: Allergy & Precautions, NPO status , Patient's Chart, lab work & pertinent test results  Airway Mallampati: I  TM Distance: >3 FB Neck ROM: Full    Dental no notable dental hx. (+) Poor Dentition, Missing, Dental Advisory Given,    Pulmonary sleep apnea , former smoker,    Pulmonary exam normal breath sounds clear to auscultation       Cardiovascular hypertension, Pt. on medications Normal cardiovascular exam Rhythm:Regular Rate:Normal     Neuro/Psych  Neuromuscular disease negative psych ROS   GI/Hepatic hiatal hernia, (+)     substance abuse  , Hepatitis -, CCrack cocaine use many years ago. Chronic hepatitis with some abdominal pain. She avoids acetaminophen.   Endo/Other  negative endocrine ROS  Renal/GU negative Renal ROS  negative genitourinary   Musculoskeletal  (+) Arthritis ,   Abdominal   Peds negative pediatric ROS (+)  Hematology negative hematology ROS (+)   Anesthesia Other Findings   Reproductive/Obstetrics negative OB ROS                            Anesthesia Physical  Anesthesia Plan  ASA: III  Anesthesia Plan: MAC   Post-op Pain Management:    Induction: Intravenous  PONV Risk Score and Plan: 3 and Ondansetron, Midazolam and TIVA  Airway Management Planned: Nasal Cannula, Natural Airway, Simple Face Mask and Mask  Additional Equipment: None  Intra-op Plan:   Post-operative Plan:   Informed Consent: I have reviewed the patients History and Physical, chart, labs and discussed the procedure including the risks, benefits and alternatives for the proposed anesthesia with the patient or authorized  representative who has indicated his/her understanding and acceptance.     Dental advisory given  Plan Discussed with: CRNA, Anesthesiologist and Surgeon  Anesthesia Plan Comments:        Anesthesia Quick Evaluation

## 2020-07-14 NOTE — Discharge Instructions (Addendum)
Beginning the day after surgery:  You may sit in a tub of warm water 2-3 times a day to relieve discomfort.  Eat a regular diet high in fiber.  Avoid foods that give you constipation or diarrhea.  Avoid foods that are difficult to digest, such as seeds, nuts, corn or popcorn.  Do not go any longer than 2 days without a bowel movement.  You may take a dose of Milk of Magnesia if you become constipated.    Drink 6-8 glasses of water daily.  Walking is encouraged.  Avoid strenuous activity and heavy lifting for one month after surgery.    Call the office if you have any questions or concerns.  Call immediately if you develop:   Excessive rectal bleeding (more than a cup or passing large clots)  Increased discomfort  Fever greater than 100 F  Difficulty urinating   Post Anesthesia Home Care Instructions  Activity: Get plenty of rest for the remainder of the day. A responsible individual must stay with you for 24 hours following the procedure.  For the next 24 hours, DO NOT: -Drive a car -Paediatric nurse -Drink alcoholic beverages -Take any medication unless instructed by your physician -Make any legal decisions or sign important papers.  Meals: Start with liquid foods such as gelatin or soup. Progress to regular foods as tolerated. Avoid greasy, spicy, heavy foods. If nausea and/or vomiting occur, drink only clear liquids until the nausea and/or vomiting subsides. Call your physician if vomiting continues.  Special Instructions/Symptoms: Your throat may feel dry or sore from the anesthesia or the breathing tube placed in your throat during surgery. If this causes discomfort, gargle with warm salt water. The discomfort should disappear within 24 hours.  If you had a scopolamine patch placed behind your ear for the management of post- operative nausea and/or vomiting:  1. The medication in the patch is effective for 72 hours, after which it should be removed.  Wrap patch in a  tissue and discard in the trash. Wash hands thoroughly with soap and water. 2. You may remove the patch earlier than 72 hours if you experience unpleasant side effects which may include dry mouth, dizziness or visual disturbances. 3. Avoid touching the patch. Wash your hands with soap and water after contact with the patch.    Next dose of Tylenol after 2:15 PM today if needed.

## 2020-07-14 NOTE — H&P (Signed)
The patient is a 60 year old female who presents with anal lesions. 60 year old female with history of transanal hemorrhoidal pexy who presents to the office for evaluation of an anal papilla which was biopsied during recent colonoscopy. This showed AIN grade 1. Patient denies any anal pain or rectal bleeding. She does note a hemorrhoid that occasionally prolapses.   Past Surgical History Janeann Forehand, CNA; 06/07/2020 11:42 AM) Cesarean Section - 1 Colon Polyp Removal - Colonoscopy Colon Polyp Removal - Open  Diagnostic Studies History Janeann Forehand, CNA; 06/07/2020 11:42 AM) Mammogram 1-3 years ago within last year  Allergies Janeann Forehand, CNA; 06/07/2020 11:42 AM) Codeine Sulfate *ANALGESICS - OPIOID* Morphine Sulfate (Concentrate) *ANALGESICS - OPIOID* Allergies Reconciled  Medication History  Anusol-HC (2.5% Cream, 1 (one) Cream Rectal twice daily, as needed, Taken starting 03/09/2015) Active. Pravastatin Sodium (20MG  Tablet, Oral) Active. Triamterene-HCTZ (37.5-25MG  Tablet, Oral) Active. OxyCODONE HCl (5MG  Tablet, Oral as needed) Active. Gabapentin (300MG  Capsule, Oral daily) Active. Medications Reconciled  Social History Janeann Forehand, CNA; 06/07/2020 11:42 AM) Caffeine use Coffee. Illicit drug use Prefer to discuss with provider. No alcohol use  Family History Janeann Forehand, CNA; 06/07/2020 11:42 AM) Family history unknown First Degree Relatives  Pregnancy / Birth History Janeann Forehand, CNA; 06/07/2020 11:42 AM) Age at menarche 5 years, 1 years. Age of menopause 67-50 Gravida 52 3 Maternal age 47-20 Para 3  Other Problems Janeann Forehand, CNA; 06/07/2020 11:42 AM) High blood pressure Hypercholesterolemia Sleep Apnea     Review of Systems General Not Present- Appetite Loss, Chills, Fatigue, Fever, Night Sweats, Weight Gain and Weight Loss. Skin Not Present- Change in Wart/Mole, Dryness, Hives, Jaundice,  New Lesions, Non-Healing Wounds, Rash and Ulcer. HEENT Present- Ringing in the Ears and Wears glasses/contact lenses. Not Present- Earache, Hearing Loss, Hoarseness, Nose Bleed, Oral Ulcers, Seasonal Allergies, Sinus Pain, Sore Throat, Visual Disturbances and Yellow Eyes. Respiratory Not Present- Bloody sputum, Chronic Cough, Difficulty Breathing, Snoring and Wheezing. Breast Not Present- Breast Mass, Breast Pain, Nipple Discharge and Skin Changes. Cardiovascular Not Present- Chest Pain, Difficulty Breathing Lying Down, Leg Cramps, Palpitations, Rapid Heart Rate, Shortness of Breath and Swelling of Extremities. Gastrointestinal Not Present- Abdominal Pain, Bloating, Bloody Stool, Change in Bowel Habits, Chronic diarrhea, Constipation, Difficulty Swallowing, Excessive gas, Gets full quickly at meals, Hemorrhoids, Indigestion, Nausea, Rectal Pain and Vomiting. Female Genitourinary Not Present- Frequency, Nocturia, Painful Urination, Pelvic Pain and Urgency. Musculoskeletal Not Present- Back Pain, Joint Pain, Joint Stiffness, Muscle Pain, Muscle Weakness and Swelling of Extremities. Neurological Present- Numbness. Not Present- Decreased Memory, Fainting, Headaches, Seizures, Tingling, Tremor, Trouble walking and Weakness. Endocrine Present- Hair Changes. Not Present- Cold Intolerance, Excessive Hunger, Heat Intolerance, Hot flashes and New Diabetes.  Ht 5\' 1"  (1.549 m)   Wt 72.6 kg   LMP 01/22/2011   BMI 30.23 kg/m      Physical Exam   General Mental Status-Alert. General Appearance-Cooperative. CV: RRR Abd: soft Rectal Note: Noninflamed right anterior external hemorrhoid, mild anal stenosis.   Procedures  Other: Procedure: Anoscopy....Marland KitchenMarland KitchenSurgeon: Marcello Moores....Marland KitchenMarland KitchenAfter the risks and benefits were explained, verbal consent was obtained for above procedure. A medical assistant chaperone was present thoroughout the entire procedure. ....Marland KitchenMarland KitchenAnesthesia: none....Marland KitchenMarland KitchenDiagnosis:  AIN....Marland KitchenMarland KitchenFindings: There appears to be an anal papilla at posterior midline. Difficult to visualize entire anal canal due to stool.  Performed: 06/07/2020 11:58 AM    Assessment & Plan   AIN GRADE I (Y70.62) Impression: 60 year old female who underwent recent colonoscopy. Biopsy of an anal papilla was performed. This showed AIN grade 1. On  exam today, this appears to be in posterior midline. We discussed monitoring this in the office with serial exams versus excision. Patient would like to proceed with excision.

## 2020-07-14 NOTE — Op Note (Signed)
07/14/2020  9:59 AM  PATIENT:  Stacy Thomas  60 y.o. female  Patient Care Team: Asencion Noble, MD as PCP - General  PRE-OPERATIVE DIAGNOSIS:  AIN I  POST-OPERATIVE DIAGNOSIS:  AIN I  PROCEDURE:   EXAM UNDER ANESTHESIA EXCISION OF ANAL PAPILLA   Surgeon(s): Leighton Ruff, MD  ASSISTANT: none   ANESTHESIA:   local and MAC  SPECIMEN:  Source of Specimen:  anal papilla  DISPOSITION OF SPECIMEN:  PATHOLOGY  COUNTS:  YES  PLAN OF CARE: Discharge to home after PACU  PATIENT DISPOSITION:  PACU - hemodynamically stable.  INDICATION: 60 y.o. F with AIN 1 seen on posterior midline anal papilla   OR FINDINGS: Diminutive posterior midline anal papilla with slight acetowhite staining  DESCRIPTION: the patient was identified in the preoperative holding area and taken to the OR where they were laid on the operating room table.  MAC anesthesia was induced without difficulty. The patient was then positioned in prone jackknife position with buttocks gently taped apart.  The patient was then prepped and draped in usual sterile fashion.  SCDs were noted to be in place prior to the initiation of anesthesia. A surgical timeout was performed indicating the correct patient, procedure, positioning and need for preoperative antibiotics.  A rectal block was performed using Marcaine with epinephrine.    I began with a digital rectal exam.  There were no obvious masses noted.  The patient had a large left posterior anal skin tag.  I then placed a Hill-Ferguson anoscope into the anal canal and evaluated this completely.  There were no obvious masses that appeared like the colonoscopy pictures.  I decided to stay in the area with acetic acid for better identification.  Using the landmarks on the colonoscopy report and the acetic acid staining I was able to identify what appeared to be the remnant of the anal polyp.  This was excised using Metzenbaum scissors.  The incision was closed using a  figure-of-eight 2-0 chromic suture.  Hemostasis was good.  The specimen was sent to pathology for further examination.  There were no other acetowhite staining areas in the anal canal.  Patient had minimal internal hemorrhoid disease.  A dressing was applied.  The patient was then awakened from anesthesia and sent to the postanesthesia care unit in stable condition.  All counts were correct per operating room staff.

## 2020-07-14 NOTE — Transfer of Care (Signed)
Immediate Anesthesia Transfer of Care Note  Patient: Stacy Thomas  Procedure(s) Performed: [name] UNDER ANESTHESIA (N/A Rectum) EXCISION OF ANAL PAPILLA (N/A Anus)  Patient Location: PACU  Anesthesia Type:MAC  Level of Consciousness: drowsy, patient cooperative and responds to stimulation  Airway & Oxygen Therapy: Patient Spontanous Breathing and Patient connected to face mask oxygen  Post-op Assessment: Report given to RN and Post -op Vital signs reviewed and stable  Post vital signs: Reviewed and stable  Last Vitals:  Vitals Value Taken Time  BP 120/80 07/14/20 1007  Temp    Pulse 114 07/14/20 1009  Resp 26 07/14/20 1009  SpO2 97 % 07/14/20 1009  Vitals shown include unvalidated device data.  Last Pain:  Vitals:   07/14/20 0841  TempSrc:   PainSc: 0-No pain      Patients Stated Pain Goal: 5 (53/[id number]/67 [id number])  Complications: No complications documented.

## 2020-07-14 NOTE — Anesthesia Postprocedure Evaluation (Signed)
Anesthesia Post Note  Patient: Stacy Thomas  Procedure(s) Performed: EXAM UNDER ANESTHESIA (N/A Rectum) EXCISION OF ANAL PAPILLA (N/A Anus)     Patient location during evaluation: PACU Anesthesia Type: General Level of consciousness: awake and alert Pain management: pain level controlled Vital Signs Assessment: post-procedure vital signs reviewed and stable Respiratory status: spontaneous breathing, nonlabored ventilation, respiratory function stable and patient connected to nasal cannula oxygen Cardiovascular status: blood pressure returned to baseline and stable Postop Assessment: no apparent nausea or vomiting Anesthetic complications: no   No complications documented.  Last Vitals:  Vitals:   07/14/20 1046 07/14/20 1130  BP:  123/85  Pulse:  78  Resp: 16 16  Temp:  (!) 36.3 C  SpO2: 96% 100%    Last Pain:  Vitals:   07/14/20 1130  TempSrc:   PainSc: 0-No pain                 Chasin Findling

## 2020-07-15 LAB — SURGICAL PATHOLOGY

## 2020-07-18 ENCOUNTER — Encounter (HOSPITAL_BASED_OUTPATIENT_CLINIC_OR_DEPARTMENT_OTHER): Payer: Self-pay | Admitting: General Surgery

## 2020-07-22 ENCOUNTER — Telehealth: Payer: Self-pay

## 2020-07-22 NOTE — Telephone Encounter (Signed)
VOB has been submitted for SynviscOne, right knee. Pending BV. 

## 2020-07-26 ENCOUNTER — Telehealth: Payer: Self-pay

## 2020-07-26 NOTE — Telephone Encounter (Signed)
PA required for SynviscOne, right knee. Submitted PA online through [employer] Pending PA

## 2020-07-27 ENCOUNTER — Telehealth: Payer: Self-pay

## 2020-07-27 NOTE — Telephone Encounter (Signed)
Talked with patient to schedule appointment for gel injection, but patient stated not at this time.  Approved for SynviscOne, right knee. Buy & Bill Covered at 100% after a Co-pay Co-pay of $150.00 PA Approved through Covermymeds  PA# [id number] Valid 07/26/2020- 01/21/2021

## 2020-08-08 ENCOUNTER — Telehealth: Payer: Self-pay

## 2020-08-08 ENCOUNTER — Encounter: Payer: Self-pay | Admitting: Internal Medicine

## 2020-08-08 ENCOUNTER — Other Ambulatory Visit: Payer: Self-pay

## 2020-08-08 ENCOUNTER — Ambulatory Visit: Payer: BC Managed Care – PPO | Admitting: Internal Medicine

## 2020-08-08 DIAGNOSIS — Z89612 Acquired absence of left leg above knee: Secondary | ICD-10-CM

## 2020-08-08 DIAGNOSIS — K862 Cyst of pancreas: Secondary | ICD-10-CM

## 2020-08-08 DIAGNOSIS — I10 Essential (primary) hypertension: Secondary | ICD-10-CM | POA: Diagnosis not present

## 2020-08-08 DIAGNOSIS — K7460 Unspecified cirrhosis of liver: Secondary | ICD-10-CM | POA: Diagnosis not present

## 2020-08-08 DIAGNOSIS — G5603 Carpal tunnel syndrome, bilateral upper limbs: Secondary | ICD-10-CM

## 2020-08-08 DIAGNOSIS — N1831 Chronic kidney disease, stage 3a: Secondary | ICD-10-CM

## 2020-08-08 MED ORDER — LISINOPRIL-HYDROCHLOROTHIAZIDE 20-25 MG PO TABS: 1.0000 | ORAL_TABLET | Freq: Every day | ORAL | 3 refills | Status: DC

## 2020-08-08 MED ORDER — ATORVASTATIN CALCIUM 40 MG PO TABS: 40.0000 mg | ORAL_TABLET | Freq: Every day | ORAL | 3 refills | Status: DC

## 2020-08-08 NOTE — Progress Notes (Signed)
   CC: Hypertension, right hand pain, lab review  HPI:  Stacy Thomas is a 60 y.o. 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  Past Medical History:  Diagnosis Date  . Bilateral arm pain    chronic, due to use of crutches  . Dependence on crutches    left leg prosthesis  . Drug addiction in remission ([place])    since 2007  (crack cocaine,  IV drug use)  . Hand weakness   . Hepatic cirrhosis ([place]) 2018   followed by dr Havery Moros ([facility]) due to hx chronic hepatitis c  . Hiatal hernia   . History of esophageal stricture    post dilation x2  . History of hepatitis C    pt completed harvoni treatment 2017, cured;  genotype 1a with fibrosis 3/4,  previously seen by dr Linus Salmons  . History of osteosarcoma    1982  left knee---  s/p  AKA LLE--   per pt no recurrence  . History of recreational drug use multiple rehab visits   IV drug use for 1 yr and Smoked crack for 30+yrs--  per pt clean since 2007  . Hyperlipidemia   . Hypertension   . Idiopathic peripheral neuropathy   . OSA (obstructive sleep apnea)    study in epic 10-27-2018 by dr Rexene Alberts,  moderate osa,  pt stated used cpap until approx. 07/ 2021, intolerate of mask  . Osteoarthritis   . Pancreatic cyst    followed by dr Havery Moros--- per office note bening and stable  . Pre-diabetes   . Sigmoid diverticulosis    mild  . Wears glasses    Review of Systems:   Constitutional: Negative for chills and fever.  Respiratory: Negative for shortness of breath.   Cardiovascular: Negative for chest pain and leg swelling.  Gastrointestinal: Negative for abdominal pain, nausea and vomiting.  Musculoskeletal: Positive for right hand pain and right hand stiffness. Neurological: Negative for  dizziness and headaches.    Physical Exam:  Vitals:   08/08/20 1434  BP: 110/71  Pulse: 86  Temp: 98.6 F (37 C)  TempSrc: Oral  SpO2: 97%  Weight: 155 lb 3.2 oz (70.4 kg)  Height: 5\' 1"  (1.549 m)   Physical Exam Constitutional:      Appearance: Normal appearance.  HENT:     Head: Normocephalic and atraumatic.  Cardiovascular:     Rate and Rhythm: Normal rate and regular rhythm.     Pulses: Normal pulses.     Heart sounds: Normal heart sounds.  Pulmonary:     Effort: Pulmonary effort is normal. No respiratory distress.     Breath sounds: Normal breath sounds.  Abdominal:     General: Abdomen is flat. Bowel sounds are normal.     Palpations: Abdomen is soft.  Musculoskeletal:     Comments: Left AKA  Skin:    General: Skin is warm and dry.  Neurological:     General: No focal deficit present.     Mental Status: She is alert and oriented to person, place, and time.      Assessment & Plan:   See Encounters Tab for problem based charting.  Patient discussed with Dr. Evette Doffing

## 2020-08-08 NOTE — Patient Instructions (Addendum)
Ms. Stacy Thomas,  It was a pleasure to see you today. Thank you for coming in.   Today we discussed your hand pain. In regards to this please start using the brace daily. I have given you some information about carpal tunnel syndrome. Please discuss with your orthopedic doctor if you would like to have a steroid injection or discuss surgical options.    We also discussed your blood pressure. Please continue taking your medications as prescribed. Your blood pressure looks great today!   Please return to clinic in 6 months or sooner if needed.   Thank you again for coming in.   Lonia Skinner M.D.  Carpal Tunnel Syndrome  Carpal tunnel syndrome is a condition that causes pain, weakness, and numbness in your hand and arm. Numbness is when you cannot feel an area in your body. The carpal tunnel is a narrow area that is on the palm side of your wrist. Repeated wrist motion or certain diseases may cause swelling in the tunnel. This swelling can pinch the main nerve in the wrist. This nerve is called the median nerve. What are the causes? This condition may be caused by:  Moving your hand and wrist over and over again while doing a task.  Injury to the wrist.  Arthritis.  A sac of fluid (cyst) or abnormal growth (tumor) in the carpal tunnel.  Fluid buildup during pregnancy.  Use of tools that vibrate. Sometimes the cause is not known. What increases the risk? The following factors may make you more likely to have this condition:  Having a job that makes you do these things: ? Move your hand over and over again. ? Work with tools that vibrate, such as drills or sanders.  Being a woman.  Having diabetes, obesity, thyroid problems, or kidney failure. What are the signs or symptoms? Symptoms of this condition include:  A tingling feeling in your fingers.  Tingling or loss of feeling in your hand.  Pain in your entire arm. This pain may get worse when you bend your wrist and  elbow for a long time.  Pain in your wrist that goes up your arm to your shoulder.  Pain that goes down into your palm or fingers.  Weakness in your hands. You may find it hard to grab and hold items. You may feel worse at night. How is this treated? This condition may be treated with:  Lifestyle changes. You will be asked to stop or change the activity that caused your problem.  Doing exercises and activities that make bones, muscles, and tendons stronger (physical therapy).  Learning how to use your hand again (occupational therapy).  Medicines for pain and swelling. You may have injections in your wrist.  A wrist splint or brace.  Surgery. Follow these instructions at home: If you have a splint or brace:  Wear the splint or brace as told by your doctor. Take it off only as told by your doctor.  Loosen the splint if your fingers: ? Tingle. ? Become numb. ? Turn cold and blue.  Keep the splint or brace clean.  If the splint or brace is not waterproof: ? Do not let it get wet. ? Cover it with a watertight covering when you take a bath or a shower. Managing pain, stiffness, and swelling If told, put ice on the painful area:  If you have a removable splint or brace, remove it as told by your doctor.  Put ice in a plastic bag.  Place a towel between your skin and the bag.  Leave the ice on for 20 minutes, 2-3 times per day. Do not fall asleep with the cold pack on your skin.  Take off the ice if your skin turns bright red. This is very important. If you cannot feel pain, heat, or cold, you have a greater risk of damage to the area. Move your fingers often to reduce stiffness and swelling.   General instructions  Take over-the-counter and prescription medicines only as told by your doctor.  Rest your wrist from any activity that may cause pain. If needed, talk with your boss at work about changes that can help your wrist heal.  Do exercises as told by your doctor,  physical therapist, or occupational therapist.  Keep all follow-up visits. Contact a doctor if:  You have new symptoms.  Medicine does not help your pain.  Your symptoms get worse. Get help right away if:  You have very bad numbness or tingling in your wrist or hand. Summary  Carpal tunnel syndrome is a condition that causes pain in your hand and arm.  It is often caused by repeated wrist motions.  Lifestyle changes and medicines are used to treat this problem. Surgery may help in very bad cases.  Follow your doctor's instructions about wearing a splint, resting your wrist, keeping follow-up visits, and calling for help. This information is not intended to replace advice given to you by your health care provider. Make sure you discuss any questions you have with your health care provider. Document Revised: 07/30/2019 Document Reviewed: 07/30/2019 Elsevier Patient Education  [place].

## 2020-08-08 NOTE — Telephone Encounter (Signed)
Called and scheduled patient for MRCP at [facility] on Monday 5-23 at 8:00am to arrive at 7:45am, NPO 4 hours. Called patient and left detailed message.  Also sent a MyChart message and mailed a letter to patient.

## 2020-08-08 NOTE — Telephone Encounter (Signed)
-----   Message from Roetta Sessions, [place] sent at 08/18/2019 11:44 AM EDT ----- Regarding: MRCP due in May  Due for MRCP  - pancreatic cyst and cirrhosis May 2022

## 2020-08-09 NOTE — Assessment & Plan Note (Signed)
Patient follows with Dr. Enis Gash for cirrhosis and pancreatic lesion follow up.  Last U/S 02/22/20 showed mild changes of cirrhosis, no focal mass seen.

## 2020-08-09 NOTE — Assessment & Plan Note (Signed)
Patient is currently on lisinopril-HCTZ 20-25 mg daily.  She denies any issues taking her medications, denies any headaches, vision changes, chest pain, palpitations, weakness, nausea, vomiting, shortness of breath, or other symptoms.  Blood pressure today is 110/71, previously blood pressures have been around 120s over 80s.  Blood pressures well controlled.  BMP on 4/4 showed normal electrolytes and normal kidney function.  -Continue lisinopril-HCTZ 20-25 mg daily

## 2020-08-09 NOTE — Assessment & Plan Note (Signed)
Patient reports having right hand pain for at least 2 years, states that it is worsening, states that it is located over her whole hand, it is always there is worse for 10 to 15 minutes at a time, and worse at night.  She reports that it is an aching, stinging, and burning sensation.  She also reports some numbness in all of her fingers.  As well as stiffness that last for few minutes at night.  She has tried acetaminophen and Voltaren gel which she reports has not helped.  She occasionally will wear a wrist brace however does not use it consistently.  She uses crutches daily and places a lot of pressure on her right hand. She has a history of bilateral carpal tunnel syndrome that was noted with nerve conduction testing.  On exam she has no swelling, erythema, or tenderness to palpation, negative tinel sign and phalen test, strength was 5/5 with grip and finger extension. We discussed that this could still be related to her carpal tunnel syndrome. Advised to start using the brace nightly and that she can follow up with her orthopedic doctor to discuss steroid injection or surgical intervention.

## 2020-08-09 NOTE — Assessment & Plan Note (Signed)
Hx of osteosarcoma in 1980s, with subsequent AKA.

## 2020-08-09 NOTE — Progress Notes (Signed)
Internal Medicine Clinic Attending ° °Case discussed with Dr. Krienke  At the time of the visit.  We reviewed the resident’s history and exam and pertinent patient test results.  I agree with the assessment, diagnosis, and plan of care documented in the resident’s note.  °

## 2020-08-22 ENCOUNTER — Other Ambulatory Visit: Payer: Self-pay

## 2020-08-22 ENCOUNTER — Other Ambulatory Visit: Payer: Self-pay | Admitting: Gastroenterology

## 2020-08-22 ENCOUNTER — Ambulatory Visit (HOSPITAL_COMMUNITY)
Admission: RE | Admit: 2020-08-22 | Discharge: 2020-08-22 | Disposition: A | Discharge status: Home / Self Care | Payer: BC Managed Care – PPO | Source: Ambulatory Visit | Attending: Gastroenterology | Admitting: Gastroenterology

## 2020-08-22 DIAGNOSIS — K862 Cyst of pancreas: Secondary | ICD-10-CM

## 2020-08-22 MED ORDER — GADOBUTROL 1 MMOL/ML IV SOLN
7.0000 mL | Freq: Once | INTRAVENOUS | Status: AC | PRN
  Administered 2020-08-22: 7 mL via INTRAVENOUS

## 2020-08-23 DIAGNOSIS — K7460 Unspecified cirrhosis of liver: Secondary | ICD-10-CM | POA: Diagnosis not present

## 2020-08-25 ENCOUNTER — Telehealth: Payer: Self-pay

## 2020-08-25 DIAGNOSIS — K862 Cyst of pancreas: Secondary | ICD-10-CM

## 2020-08-25 DIAGNOSIS — K7460 Unspecified cirrhosis of liver: Secondary | ICD-10-CM

## 2020-08-25 DIAGNOSIS — R5383 Other fatigue: Secondary | ICD-10-CM

## 2020-08-25 NOTE — Telephone Encounter (Signed)
-----   Message from Larina Bras, [place] sent at 03/11/2020  8:33 AM EST ----- Add Cbc, INR to cmp orders

## 2020-08-25 NOTE — Telephone Encounter (Signed)
Patient due for labs in June 

## 2020-08-27 ENCOUNTER — Encounter: Payer: Self-pay | Admitting: *Deleted

## 2020-08-30 ENCOUNTER — Other Ambulatory Visit (INDEPENDENT_AMBULATORY_CARE_PROVIDER_SITE_OTHER): Payer: BC Managed Care – PPO

## 2020-08-30 DIAGNOSIS — K862 Cyst of pancreas: Secondary | ICD-10-CM

## 2020-08-30 DIAGNOSIS — K7460 Unspecified cirrhosis of liver: Secondary | ICD-10-CM

## 2020-08-30 DIAGNOSIS — R5383 Other fatigue: Secondary | ICD-10-CM | POA: Diagnosis not present

## 2020-08-30 LAB — CBC WITH DIFFERENTIAL/PLATELET
Basophils Absolute: 0 10*3/uL (ref 0.0–0.1)
Basophils Relative: 0.3 % (ref 0.0–3.0)
Eosinophils Absolute: 0.1 10*3/uL (ref 0.0–0.7)
Eosinophils Relative: 2.2 % (ref 0.0–5.0)
HCT: 39.8 % (ref 36.0–46.0)
Hemoglobin: 13.6 g/dL (ref 12.0–15.0)
Lymphocytes Relative: 50.1 % — ABNORMAL HIGH (ref 12.0–46.0)
Lymphs Abs: 2.4 10*3/uL (ref 0.7–4.0)
MCHC: 34.1 g/dL (ref 30.0–36.0)
MCV: 88.2 fl (ref 78.0–100.0)
Monocytes Absolute: 0.3 10*3/uL (ref 0.1–1.0)
Monocytes Relative: 6.8 % (ref 3.0–12.0)
Neutro Abs: 1.9 10*3/uL (ref 1.4–7.7)
Neutrophils Relative %: 40.6 % — ABNORMAL LOW (ref 43.0–77.0)
Platelets: 216 10*3/uL (ref 150.0–400.0)
RBC: 4.52 Mil/uL (ref 3.87–5.11)
RDW: 13.7 % (ref 11.5–15.5)
WBC: 4.7 10*3/uL (ref 4.0–10.5)

## 2020-08-30 LAB — COMPREHENSIVE METABOLIC PANEL
ALT: 16 U/L (ref 0–35)
AST: 21 U/L (ref 0–37)
Albumin: 4.2 g/dL (ref 3.5–5.2)
Alkaline Phosphatase: 89 U/L (ref 39–117)
BUN: 17 mg/dL (ref 6–23)
CO2: 23 mEq/L (ref 19–32)
Calcium: 9.7 mg/dL (ref 8.4–10.5)
Chloride: 104 mEq/L (ref 96–112)
Creatinine, Ser: 1.15 mg/dL (ref 0.40–1.20)
GFR: 51.31 mL/min — ABNORMAL LOW (ref >60.00)
Glucose, Bld: 166 mg/dL — ABNORMAL HIGH (ref 70–99)
Potassium: 3.6 mEq/L (ref 3.5–5.1)
Sodium: 138 mEq/L (ref 135–145)
Total Bilirubin: 0.7 mg/dL (ref 0.2–1.2)
Total Protein: 7.1 g/dL (ref 6.0–8.3)

## 2020-08-30 LAB — PROTIME-INR
INR: 1.1 ratio — ABNORMAL HIGH (ref 0.8–1.0)
Prothrombin Time: 12.6 s (ref 9.6–13.1)

## 2020-09-03 ENCOUNTER — Emergency Department (HOSPITAL_COMMUNITY): Payer: BC Managed Care – PPO

## 2020-09-03 ENCOUNTER — Other Ambulatory Visit: Payer: Self-pay

## 2020-09-03 ENCOUNTER — Encounter (HOSPITAL_COMMUNITY): Payer: Self-pay | Admitting: Emergency Medicine

## 2020-09-03 ENCOUNTER — Emergency Department (HOSPITAL_COMMUNITY)
Admission: EM | Admit: 2020-09-03 | Discharge: 2020-09-03 | Disposition: A | Discharge status: Home / Self Care | Payer: BC Managed Care – PPO | Attending: Emergency Medicine | Admitting: Emergency Medicine

## 2020-09-03 DIAGNOSIS — N189 Chronic kidney disease, unspecified: Secondary | ICD-10-CM | POA: Insufficient documentation

## 2020-09-03 DIAGNOSIS — R519 Headache, unspecified: Secondary | ICD-10-CM

## 2020-09-03 DIAGNOSIS — Z79899 Other long term (current) drug therapy: Secondary | ICD-10-CM | POA: Insufficient documentation

## 2020-09-03 DIAGNOSIS — Z89612 Acquired absence of left leg above knee: Secondary | ICD-10-CM | POA: Insufficient documentation

## 2020-09-03 DIAGNOSIS — U071 COVID-19: Secondary | ICD-10-CM | POA: Diagnosis not present

## 2020-09-03 DIAGNOSIS — R509 Fever, unspecified: Secondary | ICD-10-CM | POA: Diagnosis not present

## 2020-09-03 DIAGNOSIS — I129 Hypertensive chronic kidney disease with stage 1 through stage 4 chronic kidney disease, or unspecified chronic kidney disease: Secondary | ICD-10-CM | POA: Insufficient documentation

## 2020-09-03 DIAGNOSIS — Z87891 Personal history of nicotine dependence: Secondary | ICD-10-CM | POA: Diagnosis not present

## 2020-09-03 DIAGNOSIS — M545 Low back pain, unspecified: Secondary | ICD-10-CM | POA: Diagnosis not present

## 2020-09-03 LAB — BASIC METABOLIC PANEL
Anion gap: 7 (ref 5–15)
BUN: 12 mg/dL (ref 8–23)
CO2: 22 mmol/L (ref 22–32)
Calcium: 8.8 mg/dL — ABNORMAL LOW (ref 8.9–10.3)
Chloride: 110 mmol/L (ref 98–111)
Creatinine, Ser: 1.16 mg/dL — ABNORMAL HIGH (ref 0.44–1.00)
GFR, Estimated: 54 mL/min — ABNORMAL LOW (ref >60)
Glucose, Bld: 138 mg/dL — ABNORMAL HIGH (ref 70–99)
Potassium: 3.5 mmol/L (ref 3.5–5.1)
Sodium: 139 mmol/L (ref 135–145)

## 2020-09-03 LAB — RESP PANEL BY RT-PCR (FLU A&B, COVID) ARPGX2
Influenza A by PCR: NEGATIVE
Influenza B by PCR: NEGATIVE
SARS Coronavirus 2 by RT PCR: POSITIVE — AB

## 2020-09-03 MED ORDER — DIPHENHYDRAMINE HCL 50 MG/ML IJ SOLN
25.0000 mg | Freq: Once | INTRAMUSCULAR | Status: AC
  Administered 2020-09-03: 25 mg via INTRAVENOUS
  Filled 2020-09-03: qty 1

## 2020-09-03 MED ORDER — NIRMATRELVIR/RITONAVIR (PAXLOVID) TABLET (RENAL DOSING): 2.0000 | ORAL_TABLET | Freq: Two times a day (BID) | ORAL | 0 refills | Status: AC

## 2020-09-03 MED ORDER — KETOROLAC TROMETHAMINE 30 MG/ML IJ SOLN
30.0000 mg | Freq: Once | INTRAMUSCULAR | Status: AC
  Administered 2020-09-03: 30 mg via INTRAVENOUS
  Filled 2020-09-03: qty 1

## 2020-09-03 MED ORDER — PROCHLORPERAZINE EDISYLATE 10 MG/2ML IJ SOLN
10.0000 mg | Freq: Once | INTRAMUSCULAR | Status: AC
  Administered 2020-09-03: 10 mg via INTRAVENOUS
  Filled 2020-09-03: qty 2

## 2020-09-03 NOTE — ED Triage Notes (Signed)
Patient reports fever with gen. body aches , headache and back pain onset last night .

## 2020-09-03 NOTE — ED Provider Notes (Signed)
Emergency Medicine Provider Triage Evaluation Note  Stacy Thomas , a 60 y.o. female  was evaluated in triage.  Pt complains of headache, bodyaches, and fever since yesterday.  Denies cough, chest pain, SOB, abdominal pain, vomiting.  Tried OTC cold relief.  Denies covid exposure, was vaccinated.  Review of Systems  Positive: Headache, body aches, fever Negative: Chest pain, SOB, cough  Physical Exam  LMP 01/22/2011  Gen:   Awake, no distress   Resp:  Normal effort  MSK:   Moves extremities without difficulty   Medical Decision Making  Medically screening exam initiated at 12:57 AM.  Appropriate orders placed.  Stacy Thomas was informed that the remainder of the evaluation will be completed by another provider, this initial triage assessment does not replace that evaluation, and the importance of remaining in the ED until their evaluation is complete.   Larene Pickett, PA-C 09/03/20 0100    Orpah Greek, MD 09/03/20 [phone]

## 2020-09-03 NOTE — ED Notes (Signed)
Patient verbalizes understanding of discharge instructions. Prescriptions and quarantine reviewed. Opportunity for questioning and answers were provided. Armband removed by staff, pt discharged from ED ambulatory.

## 2020-09-03 NOTE — ED Provider Notes (Signed)
[name]   CSN: [id number] Arrival date & time: 09/03/20  0051     History Chief Complaint  Patient presents with  . Fever;Bodyaches;Headache    Stacy Thomas is a 60 y.o. female.  Patient presents to the emergency department for evaluation of fever, headache, body aches, low back pain.  Symptoms present for 1 day.  No sore throat, cough, shortness of breath, abdominal pain, nausea, vomiting, diarrhea.        Past Medical History:  Diagnosis Date  . Bilateral arm pain    chronic, due to use of crutches  . Dependence on crutches    left leg prosthesis  . Drug addiction in remission ([place])    since 2007  (crack cocaine,  IV drug use)  . Hand weakness   . Hepatic cirrhosis ([place]) 2018   followed by dr Havery Moros (gi) due to hx chronic hepatitis c  . Hiatal hernia   . History of esophageal stricture    post dilation x2  . History of hepatitis C    pt completed harvoni treatment 2017, cured;  genotype 1a with fibrosis 3/4,  previously seen by dr Linus Salmons  . History of osteosarcoma    1982  left knee---  s/p  AKA LLE--   per pt no recurrence  . History of recreational drug use multiple rehab visits   IV drug use for 1 yr and Smoked crack for 30+yrs--  per pt clean since 2007  . Hyperlipidemia   . Hypertension   . Idiopathic peripheral neuropathy   . OSA (obstructive sleep apnea)    study in epic 10-27-2018 by dr Rexene Alberts,  moderate osa,  pt stated used cpap until approx. 07/ 2021, intolerate of mask  . Osteoarthritis   . Pancreatic cyst    followed by dr Havery Moros--- per office note bening and stable  . Pre-diabetes   . Sigmoid diverticulosis    mild  . Wears glasses     Patient Active Problem List   Diagnosis Date Noted  . Memory changes 03/01/2020  . CKD (chronic kidney disease) 09/11/2019  . HLD (hyperlipidemia) 09/09/2018  . Allergic sinusitis 12/03/2017  . Osteoarthritis of right hip 12/03/2017  . Hx of  AKA (above knee amputation), left ([place]) 02/18/2017  . Unilateral primary osteoarthritis, right knee 02/18/2017  . Vitamin D deficiency 05/04/2016  . Bilateral primary osteoarthritis of hip 04/26/2016  . Bilateral carpal tunnel syndrome 03/29/2016  . Screening for cervical cancer 03/06/2016  . PVD (peripheral vascular disease) ([place]) 03/05/2016  . Pancreatic abnormality 03/05/2016  . HTN (hypertension) 03/05/2016  . Hepatic cirrhosis ([place]) 11/15/2015  . Chronic hepatitis C ([place]) 06/15/2015  . Prolapsed internal hemorrhoids, grade 4 06/14/2015  . Hx of osteosarcoma 06/14/2015    Past Surgical History:  Procedure Laterality Date  . ABOVE KNEE LEG AMPUTATION Left 1982   osteosarcoma  . COLONOSCOPY WITH PROPOFOL  last one 04-28-2020  dr Havery Moros  . ESOPHAGOGASTRODUODENOSCOPY (EGD) WITH PROPOFOL  last one 11-02-2019  dr Havery Moros  . EXCISION BREAST BX  2015   CYST  . EXCISION OF SKIN TAG N/A 07/14/2020   Procedure: EXCISION OF ANAL PAPILLA;  Surgeon: Leighton Ruff, MD;  Location: [facility];  Service: General;  Laterality: N/A;  . TONSILLECTOMY  as child  . TRANSANAL HEMORRHOIDAL DEARTERIALIZATION N/A 10/27/2015   Procedure: TRANSANAL HEMORRHOIDAL DEARTERIALIZATION;  Surgeon: Michael Boston, MD;  Location[facility];  Service: General;  Laterality: N/A;  .  UPPER GASTROINTESTINAL ENDOSCOPY       OB History   No obstetric history on file.     Family History  Problem Relation Age of Onset  . COPD Father   . Alcohol abuse Father        Deceased  . Hypertension Mother        Living  . Healthy Daughter   . Healthy Son   . Anesthesia problems Neg Hx   . Breast cancer Neg Hx   . Colon cancer Neg Hx   . Esophageal cancer Neg Hx   . Rectal cancer Neg Hx   . Stomach cancer Neg Hx   . Colon polyps Neg Hx     Social History   Tobacco Use  . Smoking status: Former Smoker    Packs/day: 1.00    Years: 37.00    Pack years: 37.00    Types:  Cigarettes    Start date: 04/02/1973    Quit date: 12/19/2014    Years since quitting: 5.7  . Smokeless tobacco: Never Used  Vaping Use  . Vaping Use: Never used  Substance Use Topics  . Alcohol use: Never    Alcohol/week: 0.0 standard drinks  . Drug use: Not Currently    Types: "Crack" cocaine    Comment: recovery addict since 2007    Home Medications Prior to Admission medications   Medication Sig Start Date End Date Taking? Authorizing Provider  atorvastatin (LIPITOR) 40 MG tablet Take 1 tablet (40 mg total) by mouth daily. 08/08/20   Asencion Noble, MD  lisinopril-hydrochlorothiazide (ZESTORETIC) 20-25 MG tablet Take 1 tablet by mouth daily. 08/08/20   Asencion Noble, MD    Allergies    Morphine and Codeine  Review of Systems   Review of Systems  Constitutional: Positive for fever.  Genitourinary: Negative.   Musculoskeletal: Positive for back pain and myalgias. Negative for neck pain.  Neurological: Positive for headaches.  All other systems reviewed and are negative.   Physical Exam Updated Vital Signs BP 131/88 (BP Location: Left Arm)   Pulse 98   Temp 100.3 F (37.9 C) (Oral)   Resp 16   Ht 5\' 1"  (1.549 m)   Wt 72.6 kg   LMP 01/22/2011   SpO2 96%   BMI 30.23 kg/m   Physical Exam Vitals and nursing note reviewed.  Constitutional:      General: She is not in acute distress.    Appearance: Normal appearance. She is well-developed.  HENT:     Head: Normocephalic and atraumatic.     Right Ear: Hearing normal.     Left Ear: Hearing normal.     Nose: Nose normal.  Eyes:     Conjunctiva/sclera: Conjunctivae normal.     Pupils: Pupils are equal, round, and reactive to light.  Cardiovascular:     Rate and Rhythm: Regular rhythm.     Heart sounds: S1 normal and S2 normal. No murmur heard. No friction rub. No gallop.   Pulmonary:     Effort: Pulmonary effort is normal. No respiratory distress.     Breath sounds: Normal breath sounds.  Chest:     Chest  wall: No tenderness.  Abdominal:     General: Bowel sounds are normal.     Palpations: Abdomen is soft.     Tenderness: There is no abdominal tenderness. There is no guarding or rebound. Negative signs include Murphy's sign and McBurney's sign.     Hernia: No hernia is present.  Musculoskeletal:  General: Normal range of motion.     Cervical back: Normal range of motion and neck supple.  Skin:    General: Skin is warm and dry.     Findings: No rash.  Neurological:     Mental Status: She is alert and oriented to person, place, and time.     GCS: GCS eye subscore is 4. GCS verbal subscore is 5. GCS motor subscore is 6.     Cranial Nerves: No cranial nerve deficit.     Sensory: No sensory deficit.     Coordination: Coordination normal.  Psychiatric:        Speech: Speech normal.        Behavior: Behavior normal.        Thought Content: Thought content normal.     ED Results / Procedures / Treatments   Labs (all labs ordered are listed, but only abnormal results are displayed) Labs Reviewed  RESP PANEL BY RT-PCR (FLU A&B, COVID) ARPGX2 - Abnormal; Notable for the following components:      Result Value   SARS Coronavirus 2 by RT PCR POSITIVE (*)    All other components within normal limits  CULTURE, BLOOD (SINGLE)  CBC WITH DIFFERENTIAL/PLATELET  BASIC METABOLIC PANEL  LACTIC ACID, PLASMA  URINALYSIS, ROUTINE W REFLEX MICROSCOPIC    EKG None  Radiology No results found.  Procedures Procedures   Medications Ordered in ED Medications  ketorolac (TORADOL) 30 MG/ML injection 30 mg (has no administration in time range)  prochlorperazine (COMPAZINE) injection 10 mg (has no administration in time range)  diphenhydrAMINE (BENADRYL) injection 25 mg (has no administration in time range)    ED Course  I have reviewed the triage vital signs and the nursing notes.  Pertinent labs & imaging results that were available during my care of the patient were reviewed by me  and considered in my medical decision making (see chart for details).    MDM Rules/Calculators/A&P                          Patient presents to the emergency department for evaluation of fever, generalized aches and pains.  She has a associated headache.  Normal neurologic exam.  No neck pain, stiffness or signs of meningitis.  Vital signs are unremarkable other than low-grade fever.  Work-up reveals COVID-positive, explains patient's current symptoms.  Treated symptomatically.  Final Clinical Impression(s) / ED Diagnoses Final diagnoses:  Bad headache  COVID-19    Rx / DC Orders ED Discharge Orders    None       Texie Tupou, Gwenyth Allegra, MD 09/03/20 [phone]

## 2020-10-04 ENCOUNTER — Encounter: Payer: Self-pay | Admitting: *Deleted

## 2021-01-05 ENCOUNTER — Other Ambulatory Visit: Payer: Self-pay | Admitting: Internal Medicine

## 2021-01-05 DIAGNOSIS — Z1231 Encounter for screening mammogram for malignant neoplasm of breast: Secondary | ICD-10-CM

## 2021-01-31 HISTORY — PX: BREAST BIOPSY: SHX20

## 2021-02-03 ENCOUNTER — Other Ambulatory Visit: Payer: Self-pay

## 2021-02-03 ENCOUNTER — Ambulatory Visit
Admission: RE | Admit: 2021-02-03 | Discharge: 2021-02-03 | Disposition: A | Discharge status: Home / Self Care | Payer: BC Managed Care – PPO | Source: Ambulatory Visit | Attending: Internal Medicine | Admitting: Internal Medicine

## 2021-02-03 DIAGNOSIS — Z1231 Encounter for screening mammogram for malignant neoplasm of breast: Secondary | ICD-10-CM

## 2021-02-06 ENCOUNTER — Other Ambulatory Visit: Payer: Self-pay | Admitting: Internal Medicine

## 2021-02-06 DIAGNOSIS — R928 Other abnormal and inconclusive findings on diagnostic imaging of breast: Secondary | ICD-10-CM

## 2021-02-06 NOTE — Progress Notes (Deleted)
Neck pain

## 2021-02-08 ENCOUNTER — Encounter: Payer: BC Managed Care – PPO | Admitting: Internal Medicine

## 2021-02-15 ENCOUNTER — Ambulatory Visit
Admission: RE | Admit: 2021-02-15 | Discharge: 2021-02-15 | Disposition: A | Discharge status: Home / Self Care | Payer: BC Managed Care – PPO | Source: Ambulatory Visit | Attending: Internal Medicine | Admitting: Internal Medicine

## 2021-02-15 ENCOUNTER — Other Ambulatory Visit: Payer: Self-pay | Admitting: Internal Medicine

## 2021-02-15 ENCOUNTER — Other Ambulatory Visit: Payer: Self-pay

## 2021-02-15 DIAGNOSIS — R922 Inconclusive mammogram: Secondary | ICD-10-CM | POA: Diagnosis not present

## 2021-02-15 DIAGNOSIS — R928 Other abnormal and inconclusive findings on diagnostic imaging of breast: Secondary | ICD-10-CM

## 2021-02-15 DIAGNOSIS — N6320 Unspecified lump in the left breast, unspecified quadrant: Secondary | ICD-10-CM

## 2021-02-20 ENCOUNTER — Other Ambulatory Visit: Payer: Self-pay

## 2021-02-20 ENCOUNTER — Ambulatory Visit: Payer: BC Managed Care – PPO | Admitting: Student

## 2021-02-20 ENCOUNTER — Ambulatory Visit (HOSPITAL_COMMUNITY)
Admission: RE | Admit: 2021-02-20 | Discharge: 2021-02-20 | Disposition: A | Discharge status: Home / Self Care | Payer: BC Managed Care – PPO | Source: Ambulatory Visit | Attending: Internal Medicine | Admitting: Internal Medicine

## 2021-02-20 VITALS — BP 116/70 | HR 80 | Wt 153.7 lb

## 2021-02-20 DIAGNOSIS — M47812 Spondylosis without myelopathy or radiculopathy, cervical region: Secondary | ICD-10-CM | POA: Diagnosis not present

## 2021-02-20 DIAGNOSIS — I10 Essential (primary) hypertension: Secondary | ICD-10-CM

## 2021-02-20 DIAGNOSIS — R7303 Prediabetes: Secondary | ICD-10-CM | POA: Diagnosis not present

## 2021-02-20 DIAGNOSIS — Z89612 Acquired absence of left leg above knee: Secondary | ICD-10-CM | POA: Diagnosis not present

## 2021-02-20 DIAGNOSIS — R200 Anesthesia of skin: Secondary | ICD-10-CM | POA: Insufficient documentation

## 2021-02-20 DIAGNOSIS — L659 Nonscarring hair loss, unspecified: Secondary | ICD-10-CM

## 2021-02-20 DIAGNOSIS — S161XXA Strain of muscle, fascia and tendon at neck level, initial encounter: Secondary | ICD-10-CM

## 2021-02-20 DIAGNOSIS — M2578 Osteophyte, vertebrae: Secondary | ICD-10-CM | POA: Diagnosis not present

## 2021-02-20 DIAGNOSIS — M542 Cervicalgia: Secondary | ICD-10-CM

## 2021-02-20 NOTE — Patient Instructions (Signed)
[name], it was a pleasure seeing you today!  Today we discussed: - Neck pain: I believe this is due to a muscle strain. You can do physical therapy (I have given you exercises), heat/ice. If this pain does not improve within the next few weeks.  - Hand numbness: Likely this is due to your carpal tunnel syndrome. We can refer you to a hand surgeon for another opinion. We will get imaging of your neck to make sure it isn't a spinal issue.  - Hair loss: Our work-up in the clinic has been negative. We can refer you to a dermatologist.    Follow-up: 3 months   Please make sure to arrive 15 minutes prior to your next appointment. If you arrive late, you may be asked to reschedule.   We look forward to seeing you next time. Please call our clinic at [phone] if you have any questions or concerns. The best time to call is Monday-Friday from 9am-4pm, but there is someone available 24/7. If after hours or the weekend, call the main hospital number and ask for the Internal Medicine Resident On-Call. If you need medication refills, please notify your pharmacy one week in advance and they will send [place] a request.  Thank you for letting us take part in your care. Wishing you the best!  Thank you, Sanjuan Dame, MD

## 2021-02-20 NOTE — Progress Notes (Signed)
   CC: neck pain  HPI:  [name] is a 60 y.o. with peripheral vascular disease, cirrhosis 2/2 chronic hepatitis, history osteosarcoma s/p left AKA presenting to [facility] today for neck pain.  Please see problem-based list for further details.   Past Medical History:  Diagnosis Date   Bilateral arm pain    chronic, due to use of crutches   Dependence on crutches    left leg prosthesis   Drug addiction in remission ([place])    since 2007  (crack cocaine,  IV drug use)   Hand weakness    Hepatic cirrhosis ([place]) 2018   followed by dr Havery Moros (gi) due to hx chronic hepatitis c   Hiatal hernia    History of esophageal stricture    post dilation x2   History of hepatitis C    pt completed harvoni treatment 2017, cured;  genotype 1a with fibrosis 3/4,  previously seen by dr comer   History of osteosarcoma    1982  left knee---  s/p  AKA LLE--   per pt no recurrence   History of recreational drug use multiple rehab visits   IV drug use for 1 yr and Smoked crack for 30+yrs--  per pt clean since 2007   Hyperlipidemia    Hypertension    Idiopathic peripheral neuropathy    OSA (obstructive sleep apnea)    study in epic 10-27-2018 by dr Rexene Alberts,  moderate osa,  pt stated used cpap until approx. 07/ 2021, intolerate of mask   Osteoarthritis    Pancreatic cyst    followed by dr Havery Moros--- per office note bening and stable   Pre-diabetes    Sigmoid diverticulosis    mild   Wears glasses    Review of Systems:  As per HPI  Physical Exam:  Vitals:   02/20/21 1343  BP: 116/70  Pulse: 80  SpO2: 99%  Weight: 153 lb 11.2 oz (69.7 kg)   General: Resting comfortably in no acute distress CV: Regular rate, rhythm. No murmurs appreciated. Pulm: Normal work of breathing on room air. Clear to ausculation bilaterally. MSK: Normal bulk, tone. S/p L AKA. No spinal bony tenderness. No paraspinal tenderness. Mild tenderness to palpation L side of neck over trapezius. Normal passive and  active range of motion of neck. Tinel and Phalen negative bilaterally. Neuro: Awake, alert, conversing appropriately. No focal deficits. Motor 5/5 upper extremities bilaterally. Sensation in tact bilateral upper extremities.  Assessment & Plan:   See Encounters Tab for problem based charting.  Patient discussed with Dr.  Saverio Danker

## 2021-02-21 LAB — BMP8+ANION GAP
Anion Gap: 12 mmol/L (ref 10.0–18.0)
BUN/Creatinine Ratio: 18 (ref 12–28)
BUN: 16 mg/dL (ref 8–27)
CO2: 24 mmol/L (ref 20–29)
Calcium: 9.3 mg/dL (ref 8.7–10.3)
Chloride: 108 mmol/L — ABNORMAL HIGH (ref 96–106)
Creatinine, Ser: 0.91 mg/dL (ref 0.57–1.00)
Glucose: 87 mg/dL (ref 70–99)
Potassium: 4.2 mmol/L (ref 3.5–5.2)
Sodium: 144 mmol/L (ref 134–144)
eGFR: 71 mL/min/{1.73_m2} (ref >59)

## 2021-02-21 LAB — HEMOGLOBIN A1C
Est. average glucose Bld gHb Est-mCnc: 123 mg/dL
Hgb A1c MFr Bld: 5.9 % — ABNORMAL HIGH (ref 4.8–5.6)

## 2021-02-22 ENCOUNTER — Other Ambulatory Visit: Payer: Self-pay

## 2021-02-22 ENCOUNTER — Ambulatory Visit
Admission: RE | Admit: 2021-02-22 | Discharge: 2021-02-22 | Disposition: A | Discharge status: Home / Self Care | Payer: BC Managed Care – PPO | Source: Ambulatory Visit | Attending: Internal Medicine | Admitting: Internal Medicine

## 2021-02-22 DIAGNOSIS — N6320 Unspecified lump in the left breast, unspecified quadrant: Secondary | ICD-10-CM

## 2021-02-22 DIAGNOSIS — N6322 Unspecified lump in the left breast, upper inner quadrant: Secondary | ICD-10-CM | POA: Diagnosis not present

## 2021-02-22 DIAGNOSIS — N6012 Diffuse cystic mastopathy of left breast: Secondary | ICD-10-CM | POA: Diagnosis not present

## 2021-02-22 DIAGNOSIS — N6325 Unspecified lump in the left breast, overlapping quadrants: Secondary | ICD-10-CM | POA: Diagnosis not present

## 2021-02-23 DIAGNOSIS — L659 Nonscarring hair loss, unspecified: Secondary | ICD-10-CM | POA: Insufficient documentation

## 2021-02-23 DIAGNOSIS — S161XXA Strain of muscle, fascia and tendon at neck level, initial encounter: Secondary | ICD-10-CM | POA: Insufficient documentation

## 2021-02-23 DIAGNOSIS — R7303 Prediabetes: Secondary | ICD-10-CM | POA: Insufficient documentation

## 2021-02-23 DIAGNOSIS — R200 Anesthesia of skin: Secondary | ICD-10-CM | POA: Insufficient documentation

## 2021-02-23 HISTORY — DX: Nonscarring hair loss, unspecified: L65.9

## 2021-02-23 NOTE — Assessment & Plan Note (Signed)
Patient reports roughly two week history of left neck pain. Patient denies sudden onset or obvious inciting event (including fall, trauma). Mentions the pain worsens with certain movements and towards the end of the day. Denies any previous similar neck pain. She has been using supportive measures including Tylenol to help with the pain.   On physical exam, no spinal or paraspinal tenderness. Most consistent with neck muscle strain, possibly trapezius. I discussed with patient continuing to do conservative measures, including alternating ice/heat, Tylenol, and neck exercises. I provided patient with multiple exercises for her to perform. We discussed referral to physical therapy if symptoms do not improve within the next month.  - Alternate ice/heat; Tylenol 650mg  twice daily as needed - Neck exercises given to patient - Consider physical therapy referral if symptoms persist

## 2021-02-23 NOTE — Assessment & Plan Note (Signed)
Patient reports that she was previously losing all of her hair. Within the last few months patient states she shaved her head due to this. Unfortunately due to time constraints we were unable to fully address this issue. Per chart review, previous providers have checked thyroid and iron studies, both which resulted normal. I offered referral to dermatology, but patient declined at this time.

## 2021-02-23 NOTE — Assessment & Plan Note (Signed)
Patient reports worsening bilateral hand numbness over the last few months. Mentions that this typically occurs at night when she is laying down. It is very bothersome for the patient and typically occurs every night. Stacy Thomas denies hand pain, paresthesias, numbness elsewhere, neck injury.   Per chart review, patient has history of bilateral carpal tunnel syndrome diagnosed via nerve conduction study. Patient in the past has refused treatments including wearing braces on hands. Discussed this with patient, who states the braces are very uncomfortable. I discussed with the patient that likely this is 2/2 carpal tunnel. However, patient does have previous history of cancer and symptoms appear to be worsening. Therefore we will obtain imaging to rule out spinal disease.   - Cervical spine XR - Consider hand surgeon referral if imaging negative  ADDENDUM: Cervical spine XR revealed degenerative changes with foraminal narrowing in C5-C7. This unlikely would cause her symptoms. We will order MRI for further evaluation.  - MRI cervical spine

## 2021-02-23 NOTE — Assessment & Plan Note (Signed)
Last A1c checked last year. Patient currently denies polyuria and polydipsia. We will re-check A1c today for annual surveillance.  - Repeat A1c

## 2021-02-23 NOTE — Assessment & Plan Note (Signed)
BP Readings from Last 3 Encounters:  02/20/21 116/70  09/03/20 130/90  08/08/20 110/71   Patient reports compliance with her medications. Denies chest pain, dyspnea, palpitations, nausea, vomiting. Today patient's blood pressure at goal. We will obtain BMET and continue with current medication.  - Lisinopril-HCTZ 20-25mg  daily - BMET today

## 2021-02-23 NOTE — Assessment & Plan Note (Signed)
Patient requesting new crutches with braces. Patient has history of osteosarcoma with subsequent L AKA 40+ years ago.   - DME order placed

## 2021-02-27 NOTE — Progress Notes (Signed)
Internal Medicine Clinic Attending ? ?Case discussed with Dr. Braswell  At the time of the visit.  We reviewed the resident?s history and exam and pertinent patient test results.  I agree with the assessment, diagnosis, and plan of care documented in the resident?s note.  ?

## 2021-03-01 ENCOUNTER — Other Ambulatory Visit: Payer: BC Managed Care – PPO

## 2021-03-13 ENCOUNTER — Ambulatory Visit (HOSPITAL_COMMUNITY)
Admission: RE | Admit: 2021-03-13 | Discharge: 2021-03-13 | Disposition: A | Discharge status: Home / Self Care | Payer: BC Managed Care – PPO | Source: Ambulatory Visit | Attending: Internal Medicine | Admitting: Internal Medicine

## 2021-03-13 ENCOUNTER — Other Ambulatory Visit: Payer: Self-pay

## 2021-03-13 DIAGNOSIS — M542 Cervicalgia: Secondary | ICD-10-CM | POA: Diagnosis not present

## 2021-03-13 DIAGNOSIS — R200 Anesthesia of skin: Secondary | ICD-10-CM

## 2021-03-13 DIAGNOSIS — M5412 Radiculopathy, cervical region: Secondary | ICD-10-CM | POA: Diagnosis not present

## 2021-03-13 DIAGNOSIS — M2578 Osteophyte, vertebrae: Secondary | ICD-10-CM | POA: Diagnosis not present

## 2021-03-15 ENCOUNTER — Telehealth: Payer: Self-pay | Admitting: Internal Medicine

## 2021-03-15 NOTE — Telephone Encounter (Signed)
Pt requesting a call back about her imaging Results on    Date: 03/13/2021 Department: [facility] MRI    MR Cervical Spine Wo Contrast

## 2021-03-20 ENCOUNTER — Other Ambulatory Visit: Payer: Self-pay | Admitting: Student

## 2021-03-20 ENCOUNTER — Telehealth: Payer: Self-pay

## 2021-03-20 DIAGNOSIS — R200 Anesthesia of skin: Secondary | ICD-10-CM

## 2021-03-20 DIAGNOSIS — M4802 Spinal stenosis, cervical region: Secondary | ICD-10-CM

## 2021-03-20 NOTE — Telephone Encounter (Signed)
Pt is requesting a call back .. she stated that she received a call from Dr Collene Gobble  about her MRI results but she missed the call on 12/16

## 2021-03-20 NOTE — Telephone Encounter (Signed)
Requesting to speak with Dr. Collene Gobble regarding MRI results, please call pt back.

## 2021-03-21 ENCOUNTER — Telehealth: Payer: Self-pay | Admitting: Internal Medicine

## 2021-03-21 DIAGNOSIS — M542 Cervicalgia: Secondary | ICD-10-CM

## 2021-03-21 MED ORDER — CELECOXIB 100 MG PO CAPS: 100.0000 mg | ORAL_CAPSULE | Freq: Two times a day (BID) | ORAL | 0 refills | Status: AC

## 2021-03-21 NOTE — Telephone Encounter (Signed)
Hey Dr. Collene Gobble, do you want me to to call this patient back with her results?

## 2021-03-21 NOTE — Telephone Encounter (Signed)
Called patient regarding her neck pain. She states that she is still having significant neck pain, which is making it hard to ambulate at time. She denies any history of kidney disease, DM, blood thinners, or previous GI bleeds. I counseled her regarding her pain the need for only a short course of NSAID ot help her remain mobile. I told her that if her pain gets worse than we will want to see her in the clinic for further evaluation.    Lawerance Cruel, D.O.  Internal Medicine Resident, PGY-3 [facility]  Pager: [phone] 10:53 AM, 03/21/2021

## 2021-04-05 ENCOUNTER — Ambulatory Visit: Payer: BC Managed Care – PPO | Admitting: Family Medicine

## 2021-04-12 ENCOUNTER — Ambulatory Visit (INDEPENDENT_AMBULATORY_CARE_PROVIDER_SITE_OTHER): Payer: BC Managed Care – PPO | Admitting: Family Medicine

## 2021-04-12 VITALS — BP 120/78 | Ht 61.0 in | Wt 157.0 lb

## 2021-04-12 DIAGNOSIS — Z89612 Acquired absence of left leg above knee: Secondary | ICD-10-CM | POA: Diagnosis not present

## 2021-04-12 DIAGNOSIS — R200 Anesthesia of skin: Secondary | ICD-10-CM | POA: Diagnosis not present

## 2021-04-12 MED ORDER — PREDNISONE 10 MG PO TABS: ORAL_TABLET | ORAL | 0 refills | Status: DC

## 2021-04-12 NOTE — Progress Notes (Deleted)
° °  Stacy Thomas is a 61 y.o. female who presents to [facility] today for the following:  Bilateral hand numbness  Has been seen by PCP for this Had MRI of cervical spine on 03/13/2021 Results show significant degenerative changes throughout with bulging disc at C3-C4, C4-C5, C6-C7, C7-T1 ***   PMH reviewed.  ROS as above. Medications reviewed.  Exam:  LMP 01/22/2011  Gen: Well NAD MSK:  *** Hand: Inspection: No obvious deformity b/l. No swelling, erythema or bruising b/l Palpation: no TTP b/l ROM: Full ROM of the digits and wrist b/l. Fully able to extend and flex finger. Strength: 5/5 strength in the forearm, wrist and interosseus muscles b/l Neurovascular: NV intact b/l Special tests: Negative finkelstein's, negative tinel's at the carpal tunnel, negative Phalen's and reverse Phalen's  Fingers:  No swelling in PIP, DIP joints b/l. Flexor digitorum profundus and superficialis tendon functions are intact.  PIP joint collateral ligaments are stable   Neck/Back: - Inspection: no gross deformity or asymmetry, swelling or ecchymosis - Palpation: TTP ***  spinous process, TTP, *** rhomboid and trapezius with pressure point at mid scapula level - ROM: full active ROM of the cervical spine with neck extension, rotation, flexion - pain in all directions - Strength: 5/5 wrist flexion, extension, biceps flexion. 4/5 triceps extension. OK sign, interosseus strength intact  - Neuro: sensation intact in the C5-C8 nerve root distribution b/l, 2+ C5-C7 reflexes - Special testing: positive slump test, positive spurling's  A), elbow flexion (B), elbow extension (C), wrist extension (D), wrist flexion (E), finger flexion (F), finger extension (G), and finger abduction (H). The corresponding roots then proceed in order: C5 (A), C5-6 (B), C6-7 (C), C6-7 (D), C7-8 (E), C8 (F), C8 (G), and T1 (H). All the extension movements (C, D, and G) are innervated by the radial nerve. The two distal flexion  movements (E and F) are supplied by the median nerve, and the proximal one (B) by the musculocutaneous nerve. The axillary nerve supplies the deltoid muscle, which is the main shoulder abductor (A), and the interosseous muscles (H) are innervated by the ulnar nerve.    Assessment and Plan: 1) No problem-specific Assessment & Plan notes found for this encounter.   [place] Constable, D.O.  PGY-4 [facility]  04/12/2021 8:06 AM

## 2021-04-12 NOTE — Progress Notes (Signed)
PCP: Wayland Denis, MD  Subjective:   HPI: Patient is a 61 y.o. female here for neck pain and bilateral hand numbness, she was referred to by her primary care physician. Reports that the neck pain has been ongoing for the past 3 months but has intensified more over the last month. States that most of her pain is along the lateral portion of her neck. Endorses 10/10 pain that is sometimes even worse than this at night. Relieving factors include not baring weight and aggravating factors include laying on her left side at night. Shares that she is an above the knee amputee of her left leg and has been using crutches for about 42 years. Also endorsing numbness, tingling and stiffness along her left neck and upper shoulder. Denies headaches and vision changes.   Also presents for concern of her hands "balling up." Shares that this has been ongoing for the past year. Other associated symptoms include burning sensation, numbness and tingling along all finger of both of her hands. Only repetitive motion that she engages is in cleaning but sometimes says that she has to drop things that she is holding due to this abnormal sensation that she experiences. Denies any relieving or aggravating factors. Shares that she was diagnosed with carpal tunnel syndrome before but denies wearing splints in the past.    Past Medical History:  Diagnosis Date   Bilateral arm pain    chronic, due to use of crutches   Dependence on crutches    left leg prosthesis   Drug addiction in remission ([place])    since 2007  (crack cocaine,  IV drug use)   Hand weakness    Hepatic cirrhosis ([place]) 2018   followed by dr Havery Moros (gi) due to hx chronic hepatitis c   Hiatal hernia    History of esophageal stricture    post dilation x2   History of hepatitis C    pt completed harvoni treatment 2017, cured;  genotype 1a with fibrosis 3/4,  previously seen by dr comer   History of osteosarcoma    1982  left knee---  s/p  AKA LLE--    per pt no recurrence   History of recreational drug use multiple rehab visits   IV drug use for 1 yr and Smoked crack for 30+yrs--  per pt clean since 2007   Hyperlipidemia    Hypertension    Idiopathic peripheral neuropathy    OSA (obstructive sleep apnea)    study in epic 10-27-2018 by dr Rexene Alberts,  moderate osa,  pt stated used cpap until approx. 07/ 2021, intolerate of mask   Osteoarthritis    Pancreatic cyst    followed by dr Havery Moros--- per office note bening and stable   Pre-diabetes    Sigmoid diverticulosis    mild   Wears glasses     Current Outpatient Medications on File Prior to Visit  Medication Sig Dispense Refill   atorvastatin (LIPITOR) 40 MG tablet Take 1 tablet (40 mg total) by mouth daily. 90 tablet 3   lisinopril-hydrochlorothiazide (ZESTORETIC) 20-25 MG tablet Take 1 tablet by mouth daily. 90 tablet 3   No current facility-administered medications on file prior to visit.    Past Surgical History:  Procedure Laterality Date   ABOVE KNEE LEG AMPUTATION Left 1982   osteosarcoma   COLONOSCOPY WITH PROPOFOL  last one 04-28-2020  dr Havery Moros   ESOPHAGOGASTRODUODENOSCOPY (EGD) WITH PROPOFOL  last one 11-02-2019  dr Havery Moros   EXCISION BREAST BX  2015  CYST   EXCISION OF SKIN TAG N/A 07/14/2020   Procedure: EXCISION OF ANAL PAPILLA;  Surgeon: Leighton Ruff, MD;  Location: [facility];  Service: General;  Laterality: N/A;   TONSILLECTOMY  as child   TRANSANAL HEMORRHOIDAL DEARTERIALIZATION N/A 10/27/2015   Procedure: TRANSANAL HEMORRHOIDAL DEARTERIALIZATION;  Surgeon: Michael Boston, MD;  Location: [facility];  Service: General;  Laterality: N/A;   UPPER GASTROINTESTINAL ENDOSCOPY      Allergies  Allergen Reactions   Morphine Swelling    REACTION: face swells   Codeine Other (See Comments)    Avoids due to being recovery addict    BP 120/78    Ht 5\' 1"  (1.549 m)    Wt 157 lb (71.2 kg)    LMP 01/22/2011    BMI 29.66 kg/m    No flowsheet data found.  No flowsheet data found.      Objective:  Physical Exam:  Gen: NAD, comfortable in exam room MSK:   Neck/Back: - Inspection: no gross deformity or asymmetry, swelling or ecchymosis - Palpation: No TTP spinous process, TTP along trapezius down to deltoid laterally L>R - ROM: full active ROM of the cervical spine with neck extension, rotation, flexion - pain in all directions - Strength: 5/5 wrist flexion, extension, biceps flexion, triceps extension. OK sign, interosseus strength intact  - Neuro: sensation intact in the C5-C8 nerve root distribution b/l, reports slightly increased sensation throughout no specific distribution, 2+ C5-C7 reflexes - Special testing: negative spurling's  Bilateral Wrists: Inspection: No obvious deformity b/l. No swelling, erythema or bruising b/l Palpation: no TTP b/l ROM: Full ROM of the digits and wrist b/l.  Strength: 5/5 strength in the forearm, wrist and interosseus muscles b/l Neurovascular: NV intact b/l Special tests: negative tinel's at the carpal tunnel, negative Phalen's and reverse Phalen's   Limited Bedside US performed of bilateral median nerves showing normal appearance of median nerve with diameter of 0.4cm and 0.5cm of right and left respectively  Assessment & Plan:  1. Multiple cervical changes along C3-T1: Recent MRI imaging notable for grossly non-compressive disc bulges along cervical spine with C5-C6 disc herniation. These changes are likely explaining patient's symptoms. Extensively counseled patient on MRI results.  Conservative measures discussed. Consideration given to carpal tunnel syndrome although ultrasound performed today not concerning for this especially given significant cervical changes and lack of specific carpal tunnel findings on examination. 12 day course of prednisone prescription provided. Plan to follow up in 6 weeks. May consider further intervention including epidural CSI vs neurosurg  referral at that time if no improvement noted.     Fellow Addendum   I have independently interviewed and examined the patient. I have discussed the above with the original author and agree with their documentation. My edits for correction/addition/clarification have been made. Please see also any attending notes.   [place] Constable, D.O. PGY-4, [facility] 04/12/2021 3:45 PM   Addendum:  I was the preceptor for this visit and available for immediate consultation.  Karlton Lemon MD [place] [facility]

## 2021-04-12 NOTE — Patient Instructions (Signed)
Thank you for coming to see me today. It was a pleasure. Today we talked about:   Your neck has some signs of wear and tear.  You have some bone spurs as well as some discs (the jelly between the bones in your spine) that are hitting nerves and causing your pain.  I have sent a prescription for prednisone that should help your pain.  Take this in the mornings with food as it can make you hyper.    Please follow-up with [place] in 6 weeks.  If you have any questions or concerns, please do not hesitate to call the office at [phone].  Best,   Arizona Constable, DO [place]

## 2021-04-18 ENCOUNTER — Encounter: Payer: Self-pay | Admitting: Gastroenterology

## 2021-04-27 ENCOUNTER — Telehealth: Payer: Self-pay | Admitting: Orthopedic Surgery

## 2021-04-27 NOTE — Telephone Encounter (Signed)
Patient requesting a call back in regards to getting an appointment for a knee injection.

## 2021-04-27 NOTE — Telephone Encounter (Signed)
Can you please call pt and make appt? Last injection was in April of last year so she can get them if she wants.

## 2021-05-02 ENCOUNTER — Encounter: Payer: Self-pay | Admitting: Family

## 2021-05-02 ENCOUNTER — Ambulatory Visit (INDEPENDENT_AMBULATORY_CARE_PROVIDER_SITE_OTHER): Payer: BC Managed Care – PPO | Admitting: Family

## 2021-05-02 DIAGNOSIS — M1711 Unilateral primary osteoarthritis, right knee: Secondary | ICD-10-CM

## 2021-05-02 NOTE — Progress Notes (Signed)
Office Visit Note   Patient: Stacy Thomas           Date of Birth: 08/30/1960           MRN: [id number] Visit Date: 05/02/2021              Requested by: Wayland Denis, MD [place],  [place] [address] PCP: Wayland Denis, MD  Chief Complaint  Patient presents with   Right Knee - Follow-up      HPI: The patient is a 61 year old woman who presents today for complaint of right knee pain.  This has returned just in the last month.  Her last Depo-Medrol injection for her osteoarthritis was greater than a year ago and gave her great relief.  She this time has been having mechanical symptoms of the knee.  Primarily medial pain.  She is requesting repeat injection.  Assessment & Plan: Visit Diagnoses: No diagnosis found.  Plan: Depo-Medrol injection right knee.  Patient tolerated well.  She will follow-up in the office as needed  Follow-Up Instructions: Return if symptoms worsen or fail to improve.   Right Knee Exam   Muscle Strength  The patient has normal right knee strength.  Tenderness  The patient is experiencing tenderness in the medial joint line.  Range of Motion  The patient has normal right knee ROM.  Tests  Varus: negative Valgus: negative  Other  Erythema: absent Swelling: mild Effusion: no effusion present     Patient is alert, oriented, no adenopathy, well-dressed, normal affect, normal respiratory effort.   Imaging: No results found. No images are attached to the encounter.  Labs: Lab Results  Component Value Date   HGBA1C 5.9 (H) 02/20/2021   HGBA1C 5.9 (H) 02/29/2020   HGBA1C 6.1 (A) 09/10/2019     Lab Results  Component Value Date   ALBUMIN 4.2 08/30/2020   ALBUMIN 4.7 03/10/2020   ALBUMIN 4.1 09/28/2019    No results found for: MG Lab Results  Component Value Date   VD25OH 15.8 (L) 05/03/2016    No results found for: PREALBUMIN CBC EXTENDED Latest Ref Rng & Units 08/30/2020 07/14/2020 03/10/2020  WBC 4.0 -  10.5 K/uL 4.7 - 11.2(H)  RBC 3.87 - 5.11 Mil/uL 4.52 - 4.75  HGB 12.0 - 15.0 g/dL 13.6 13.6 14.0  HCT 36.0 - 46.0 % 39.8 40.0 42.0  PLT 150.0 - 400.0 K/uL 216.0 - 250.0  NEUTROABS 1.4 - 7.7 K/uL 1.9 - 8.6(H)  LYMPHSABS 0.7 - 4.0 K/uL 2.4 - 1.8     There is no height or weight on file to calculate BMI.  Orders:  No orders of the defined types were placed in this encounter.  No orders of the defined types were placed in this encounter.    Procedures: Large Joint Inj: R knee on 05/02/2021 9:00 AM Indications: pain Details: 18 G 1.5 in needle, anteromedial approach Medications: 5 mL lidocaine 1 %; 40 mg methylPREDNISolone acetate 40 MG/ML Consent was given by the patient.     Clinical Data: No additional findings.  ROS:  All other systems negative, except as noted in the HPI. Review of Systems  Objective: Vital Signs: LMP 01/22/2011   Specialty Comments:  No specialty comments available.  PMFS History: Patient Active Problem List   Diagnosis Date Noted   Strain of neck muscle 02/23/2021   Bilateral hand numbness 02/23/2021   Prediabetes 02/23/2021   Hair loss 02/23/2021   Memory changes 03/01/2020   CKD (  chronic kidney disease) 09/11/2019   HLD (hyperlipidemia) 09/09/2018   Allergic sinusitis 12/03/2017   Osteoarthritis of right hip 12/03/2017   Hx of AKA (above knee amputation), left ([place]) 02/18/2017   Unilateral primary osteoarthritis, right knee 02/18/2017   Vitamin D deficiency 05/04/2016   Bilateral primary osteoarthritis of hip 04/26/2016   Bilateral carpal tunnel syndrome 03/29/2016   Screening for cervical cancer 03/06/2016   PVD (peripheral vascular disease) ([place]) 03/05/2016   Pancreatic abnormality 03/05/2016   HTN (hypertension) 03/05/2016   Hepatic cirrhosis ([place]) 11/15/2015   Chronic hepatitis C ([place]) 06/15/2015   Prolapsed internal hemorrhoids, grade 4 06/14/2015   Hx of osteosarcoma 06/14/2015   Past Medical History:  Diagnosis Date    Bilateral arm pain    chronic, due to use of crutches   Dependence on crutches    left leg prosthesis   Drug addiction in remission ([place])    since 2007  (crack cocaine,  IV drug use)   Hand weakness    Hepatic cirrhosis ([place]) 2018   followed by dr Havery Moros (gi) due to hx chronic hepatitis c   Hiatal hernia    History of esophageal stricture    post dilation x2   History of hepatitis C    pt completed harvoni treatment 2017, cured;  genotype 1a with fibrosis 3/4,  previously seen by dr comer   History of osteosarcoma    1982  left knee---  s/p  AKA LLE--   per pt no recurrence   History of recreational drug use multiple rehab visits   IV drug use for 1 yr and Smoked crack for 30+yrs--  per pt clean since 2007   Hyperlipidemia    Hypertension    Idiopathic peripheral neuropathy    OSA (obstructive sleep apnea)    study in epic 10-27-2018 by dr Rexene Alberts,  moderate osa,  pt stated used cpap until approx. 07/ 2021, intolerate of mask   Osteoarthritis    Pancreatic cyst    followed by dr Havery Moros--- per office note bening and stable   Pre-diabetes    Sigmoid diverticulosis    mild   Wears glasses     Family History  Problem Relation Age of Onset   COPD Father    Alcohol abuse Father        Deceased   Hypertension Mother        Living   Healthy Daughter    Healthy Son    Anesthesia problems Neg Hx    Breast cancer Neg Hx    Colon cancer Neg Hx    Esophageal cancer Neg Hx    Rectal cancer Neg Hx    Stomach cancer Neg Hx    Colon polyps Neg Hx     Past Surgical History:  Procedure Laterality Date   ABOVE KNEE LEG AMPUTATION Left 1982   osteosarcoma   COLONOSCOPY WITH PROPOFOL  last one 04-28-2020  dr Havery Moros   ESOPHAGOGASTRODUODENOSCOPY (EGD) WITH PROPOFOL  last one 11-02-2019  dr Havery Moros   EXCISION BREAST BX  2015   CYST   EXCISION OF SKIN TAG N/A 07/14/2020   Procedure: EXCISION OF ANAL PAPILLA;  Surgeon: Leighton Ruff, MD;  Location: [facility];  Service: General;  Laterality: N/A;   TONSILLECTOMY  as child   TRANSANAL HEMORRHOIDAL DEARTERIALIZATION N/A 10/27/2015   Procedure: TRANSANAL HEMORRHOIDAL DEARTERIALIZATION;  Surgeon: Michael Boston, MD;  Location: [facility];  Service: General;  Laterality: N/A;   UPPER GASTROINTESTINAL ENDOSCOPY  Social History   Occupational History   Not on file  Tobacco Use   Smoking status: Former    Packs/day: 1.00    Years: 37.00    Pack years: 37.00    Types: Cigarettes    Start date: 04/02/1973    Quit date: 12/19/2014    Years since quitting: 6.3   Smokeless tobacco: Never  Vaping Use   Vaping Use: Never used  Substance and Sexual Activity   Alcohol use: Never    Alcohol/week: 0.0 standard drinks   Drug use: Not Currently    Types: "Crack" cocaine    Comment: recovery addict since 2007   Sexual activity: Not on file

## 2021-05-03 MED ORDER — LIDOCAINE HCL 1 % IJ SOLN
5.0000 mL | INTRAMUSCULAR | Status: AC | PRN
  Administered 2021-05-02: 5 mL

## 2021-05-03 MED ORDER — METHYLPREDNISOLONE ACETATE 40 MG/ML IJ SUSP
40.0000 mg | INTRAMUSCULAR | Status: AC | PRN
  Administered 2021-05-02: 40 mg via INTRA_ARTICULAR

## 2021-05-23 ENCOUNTER — Ambulatory Visit (INDEPENDENT_AMBULATORY_CARE_PROVIDER_SITE_OTHER): Payer: BC Managed Care – PPO | Admitting: Gastroenterology

## 2021-05-23 ENCOUNTER — Encounter: Payer: Self-pay | Admitting: Gastroenterology

## 2021-05-23 ENCOUNTER — Other Ambulatory Visit (INDEPENDENT_AMBULATORY_CARE_PROVIDER_SITE_OTHER): Payer: BC Managed Care – PPO

## 2021-05-23 VITALS — BP 94/66 | HR 96 | Ht 61.0 in | Wt 152.2 lb

## 2021-05-23 DIAGNOSIS — K862 Cyst of pancreas: Secondary | ICD-10-CM

## 2021-05-23 DIAGNOSIS — K7460 Unspecified cirrhosis of liver: Secondary | ICD-10-CM | POA: Diagnosis not present

## 2021-05-23 DIAGNOSIS — Z86010 Personal history of colonic polyps: Secondary | ICD-10-CM | POA: Diagnosis not present

## 2021-05-23 LAB — CBC WITH DIFFERENTIAL/PLATELET
Basophils Absolute: 0 10*3/uL (ref 0.0–0.1)
Basophils Relative: 0.3 % (ref 0.0–3.0)
Eosinophils Absolute: 0 10*3/uL (ref 0.0–0.7)
Eosinophils Relative: 0.9 % (ref 0.0–5.0)
HCT: 40.2 % (ref 36.0–46.0)
Hemoglobin: 13.4 g/dL (ref 12.0–15.0)
Lymphocytes Relative: 45 % (ref 12.0–46.0)
Lymphs Abs: 2.2 10*3/uL (ref 0.7–4.0)
MCHC: 33.3 g/dL (ref 30.0–36.0)
MCV: 87.8 fl (ref 78.0–100.0)
Monocytes Absolute: 0.6 10*3/uL (ref 0.1–1.0)
Monocytes Relative: 12.7 % — ABNORMAL HIGH (ref 3.0–12.0)
Neutro Abs: 2 10*3/uL (ref 1.4–7.7)
Neutrophils Relative %: 41.1 % — ABNORMAL LOW (ref 43.0–77.0)
Platelets: 263 10*3/uL (ref 150.0–400.0)
RBC: 4.58 Mil/uL (ref 3.87–5.11)
RDW: 13.7 % (ref 11.5–15.5)
WBC: 4.8 10*3/uL (ref 4.0–10.5)

## 2021-05-23 LAB — HEPATIC FUNCTION PANEL
ALT: 15 U/L (ref 0–35)
AST: 21 U/L (ref 0–37)
Albumin: 4.5 g/dL (ref 3.5–5.2)
Alkaline Phosphatase: 104 U/L (ref 39–117)
Bilirubin, Direct: 0.1 mg/dL (ref 0.0–0.3)
Total Bilirubin: 0.8 mg/dL (ref 0.2–1.2)
Total Protein: 7.6 g/dL (ref 6.0–8.3)

## 2021-05-23 LAB — PROTIME-INR
INR: 1.1 ratio — ABNORMAL HIGH (ref 0.8–1.0)
Prothrombin Time: 12.2 s (ref 9.6–13.1)

## 2021-05-23 NOTE — Progress Notes (Signed)
HPI :  61 year old female with a history of hepatitis C related cirrhosis, pancreatic cysts, history of colon polyps here for follow-up visit.  I last saw her in January 2022.  Recall she has a history of hep C diagnosed years ago, thought to be due to IV drugs / smoked crack cocaine. Genotype 1a, treated with Harvoni for 12 weeks a few years ago with eradication.  [place] with elastography prior to therapy showing normal liver and spleen, with some F3/F4 fibrotic changes. Imaging since then has showed changes concerning for cirrhosis. She has not had any decompensations to date.  Since have last seen her she has been doing really well.  No jaundice, no ascites, no hepatic encephalopathy.  Her last EGD was done in August 2021 and she had no varices at that time.  She does not drink any alcohol.  She has been feeling really well. Her liver was last imaged with an MRCP in May 2022, stable changes of cirrhosis.  Recall she has also been followed for benign-appearing pancreatic cyst, had a follow-up MRCP in May 2022 which showed stable changes in her pancreas, stable appearing cyst as outlined below.  No weight loss.  Recall she had a history of a subepithelial colon nodule in her right colon that has been monitored over time with surveillance colonoscopy.  Her last exam was in January 2022 which showed no interval enlargement.  She has had a history of colon polyps associated with this.  She has had a prior CT scan of her abdomen which showed no obvious subepithelial lesion.  She denies any problems with her bowels.  Of note on her last colonoscopy she had AIN grade 1, referred to colorectal surgery who treated her for this and is performing surveillance.     Prior workup: EGD 09/10/2016 - 3cm HH, irregular zline, no varices, H pylori gastritis, given Pylera for gastritis -    MRCP 02/11/2017 - changes of cirrhosis noted, stable 1.5cm cystic pancreatic lesion uncinate process without high risk features,  subcm LI-RADS category 3 lesion in the left lobe - recommended repeat MRI in 6 months   MRI 08/23/2017 - small left lobe lesion less conspicous, stable cystic lesion in the uncinate process - repeat MRI in 1 year   RUQ [place] - 02/25/2018 - 59mm left lobe lesion stable, suspected hemangioma   MRCP 08/22/18 - stable cystic lesion of the pancreas - measuring 11 x 29mm, no change from 02/06/2016 - radiology recommending surveillance in 1 year, mild changes of cirrhosis   [place] 06/27/16 - "mild cirrhosis", no mass lesions   CT abdomen 02/06/2016 - subtle changes of cirrhosis, indeterminate lesion in pancreas 1.5cm thought to represent cyst.    Colonoscopy 03/23/19 - The perianal and digital rectal examinations were normal. - A diminutive polyp was found in the distal ascending colon. The polyp was sessile. The polyp was removed with a cold biopsy forceps. Resection and retrieval were complete. - Two sessile polyps were found in the distal ascending colon. The polyps were 4 to 6 mm in size. These polyps were removed with a cold snare. Resection and retrieval were complete. - One roughly 5 mm subepithelial nodule was found in the distal ascending colon. It was hard to palpation with the forceps. Multiple bite on biopsies were taken with a cold forceps for histology. Area just distal to the lesion was tattooed with an injection of Spot (carbon black). - A diminutive polyp was found in the transverse colon. The polyp was  sessile. The polyp was removed with a cold snare. Resection and retrieval were complete. - Internal hemorrhoids were found during retroflexion. - There was evidence of scarring in the rectum from prior hemorrhoidectomy. The colon was tortous. The exam was otherwise without abnormality.   1. Surgical [P], colon, ascending, polyp (3) - TUBULAR ADENOMA(S). - NO HIGH GRADE DYSPLASIA OR MALIGNANCY. 2. Surgical [P], colon, ascending BX - BENIGN COLONIC MUCOSA. - NO INFLAMMATORY CHANGES,  ADENOMATOUS CHANGE OR MALIGNANCY.   CT scan 04/10/19 - IMPRESSION: 1. No CT abnormality of the colon. 2. Hepatic steatosis. 3. Aortic Atherosclerosis (ICD10-I70.0). Focal ectasia of the terminal aorta measuring up to 2.1 cm, unchanged compared to prior examination. 4. Coronary artery disease.   MRCP - 08/17/19 - IMPRESSION: 1. Minimal motion degradation. 2. Similar 1.2 cm cystic lesion within the pancreatic uncinate process. Most likely a pseudocyst. Per consensus criteria, this warrants surveillance by annual MRI/MRCP for a total of 5 years. This recommendation follows ACR consensus guidelines: Management of Incidental Pancreatic Cysts: A White Paper of the ACR Incidental Findings Committee. J Am Coll Radiol 6568;12:751-700. 3. Mild cirrhosis. 4.  Aortic Atherosclerosis (ICD10-I70.0).    EGD 11/02/19: - A 2 cm hiatal hernia was present. - The Z-line was irregular with a few diminutive islands of salmon colored mucosa, did not meet criteria for Barrett's biopsies.. - A single small area of benign ectopic gastric mucosa was found in the upper third of the esophagus. - The exam of the esophagus was otherwise normal. No esophageal varices. - The entire examined stomach was normal. No gastric varices - The duodenal bulb and second portion of the duodenum were normal.   Colonoscopy 04/28/20:The perianal and digital rectal examinations were normal. - One 5 to 6 mm subepithelial nodule was found in the distal ascending colon, proximal to area of prior tattoo. Similar in appearance compared to one year ago. Bite on bite biopsies were taken with a cold forceps for histology. - A few small-mouthed diverticula were found in the sigmoid colon. - A 4 mm polyp was found in the recto-sigmoid colon. The polyp was sessile. The polyp was removed with a cold snare. Resection and retrieval were complete. - Anal papilla(e) was hypertrophied vs. prolapsed hemorrhoid, with some inflammatory changes noted  at the base of it. Biopsies were taken with a cold forceps for histology. - Internal hemorrhoids were found during retroflexion. - There was scarring in the rectum, similar in appearance compared to prior exam. Colon was tortous. The exam was otherwise without abnormality.  1. Surgical [P], ascending colon nodule bx's - BENIGN COLONIC MUCOSA - NO ACTIVE INFLAMMATION OR EVIDENCE OF MICROSCOPIC COLITIS - NO HIGH GRADE DYSPLASIA OR MALIGNANCY IDENTIFIED 2. Surgical [P], colon, rectosigmoid, polyp - HYPERPLASTIC POLYP (1 OF 1 FRAGMENTS) - NO HIGH GRADE DYSPLASIA OR MALIGNANCY IDENTIFIED - SEE COMMENT 3. Surgical [P], colon, anal bx's - LOW GRADE SQUAMOUS INTRAEPITHELIAL LESION (AIN 1; LGSIL) - SEE COMMENT Microscopic Comment 2. Additional levels were obtained to assess for adenomatous features but none were not identified. 3. P16 immunohistochemistry supports the diagnosis of low grade dysplasia. Dr. Jeannie Done reviewed the case and agrees with the above diagnosis.  Referred to [facility]  MRCP 08/22/20: IMPRESSION: 1. Stable appearance of 1.2 cm cystic lesion within the uncinate process of the pancreas. This is favored to represent a benign process. Recommend follow-up imaging in 12 months with repeat MRI/MRCP. Per consensus criteria, this warrants surveillance by annual MRI/MRCP for a total of 5 years. This recommendation follows ACR  consensus guidelines: Management of incidental Pancreatic Cysts: A White Paper of the ACR Incidental findings Committee. J Am Coll Radiol 7858;85:027-741. 2. Mild cirrhosis. No suspicious liver lesions.    Past Medical History:  Diagnosis Date   Bilateral arm pain    chronic, due to use of crutches   Dependence on crutches    left leg prosthesis   Drug addiction in remission ([place])    since 2007  (crack cocaine,  IV drug use)   Hand weakness    Hepatic cirrhosis ([place]) 2018   followed by dr Havery Moros (gi) due to hx chronic hepatitis c   Hiatal hernia     History of esophageal stricture    post dilation x2   History of hepatitis C    pt completed harvoni treatment 2017, cured;  genotype 1a with fibrosis 3/4,  previously seen by dr comer   History of osteosarcoma    1982  left knee---  s/p  AKA LLE--   per pt no recurrence   History of recreational drug use multiple rehab visits   IV drug use for 1 yr and Smoked crack for 30+yrs--  per pt clean since 2007   Hyperlipidemia    Hypertension    Idiopathic peripheral neuropathy    OSA (obstructive sleep apnea)    study in epic 10-27-2018 by dr Rexene Alberts,  moderate osa,  pt stated used cpap until approx. 07/ 2021, intolerate of mask   Osteoarthritis    Pancreatic cyst    followed by dr Havery Moros--- per office note bening and stable   Pre-diabetes    Sigmoid diverticulosis    mild   Wears glasses      Past Surgical History:  Procedure Laterality Date   ABOVE KNEE LEG AMPUTATION Left 1982   osteosarcoma   COLONOSCOPY WITH PROPOFOL  last one 04-28-2020  dr Havery Moros   ESOPHAGOGASTRODUODENOSCOPY (EGD) WITH PROPOFOL  last one 11-02-2019  dr Havery Moros   EXCISION BREAST BX  2015   CYST   EXCISION OF SKIN TAG N/A 07/14/2020   Procedure: EXCISION OF ANAL PAPILLA;  Surgeon: Leighton Ruff, MD;  Location: [facility];  Service: General;  Laterality: N/A;   TONSILLECTOMY  as child   TRANSANAL HEMORRHOIDAL DEARTERIALIZATION N/A 10/27/2015   Procedure: TRANSANAL HEMORRHOIDAL DEARTERIALIZATION;  Surgeon: Michael Boston, MD;  Location: [facility];  Service: General;  Laterality: N/A;   UPPER GASTROINTESTINAL ENDOSCOPY     Family History  Problem Relation Age of Onset   COPD Father    Alcohol abuse Father        Deceased   Hypertension Mother        Living   Healthy Daughter    Healthy Son    Anesthesia problems Neg Hx    Breast cancer Neg Hx    Colon cancer Neg Hx    Esophageal cancer Neg Hx    Rectal cancer Neg Hx    Stomach cancer Neg Hx    Colon polyps Neg Hx     Social History   Tobacco Use   Smoking status: Former    Packs/day: 1.00    Years: 37.00    Pack years: 37.00    Types: Cigarettes    Start date: 04/02/1973    Quit date: 12/19/2014    Years since quitting: 6.4   Smokeless tobacco: Never  Vaping Use   Vaping Use: Never used  Substance Use Topics   Alcohol use: Never    Alcohol/week: 0.0 standard drinks  Drug use: Not Currently    Types: "Crack" cocaine    Comment: recovery addict since 2007   Current Outpatient Medications  Medication Sig Dispense Refill   atorvastatin (LIPITOR) 40 MG tablet Take 1 tablet (40 mg total) by mouth daily. 90 tablet 3   lisinopril-hydrochlorothiazide (ZESTORETIC) 20-25 MG tablet Take 1 tablet by mouth daily. 90 tablet 3   predniSONE (DELTASONE) 10 MG tablet Use as directed per doctors orders. 48 tablet 0   No current facility-administered medications for this visit.   Allergies  Allergen Reactions   Morphine Swelling    REACTION: face swells   Codeine Other (See Comments)    Avoids due to being recovery addict     Review of Systems: All systems reviewed and negative except where noted in HPI.   Lab Results  Component Value Date   WBC 4.7 08/30/2020   HGB 13.6 08/30/2020   HCT 39.8 08/30/2020   MCV 88.2 08/30/2020   PLT 216.0 08/30/2020    Lab Results  Component Value Date   CREATININE 0.91 02/20/2021   BUN 16 02/20/2021   NA 144 02/20/2021   K 4.2 02/20/2021   CL 108 (H) 02/20/2021   CO2 24 02/20/2021    Lab Results  Component Value Date   ALT 16 08/30/2020   AST 21 08/30/2020   ALKPHOS 89 08/30/2020   BILITOT 0.7 08/30/2020    Lab Results  Component Value Date   INR 1.1 (H) 08/30/2020   INR 1.2 (H) 03/10/2020   INR 1.1 (H) 09/09/2019     Physical Exam: BP 94/66    Pulse 96    Ht 5\' 1"  (1.549 m)    Wt 152 lb 4 oz (69.1 kg)    LMP 01/22/2011    BMI 28.77 kg/m  Constitutional: Pleasant,well-developed, female in no acute distress. Neurological: Alert and  oriented to person place and time. Psychiatric: Normal mood and affect. Behavior is normal.   ASSESSMENT AND PLAN: 61 year old female here for reassessment of the following issues:  Cirrhosis -secondary to hepatitis C, eradicated Pancreatic cyst History of colon polyps  Overall her cirrhosis is mild/well compensated, hepatitis C has been eradicated.  She has had no decompensations to date, although we discussed risks of that and risks for [facility].  She is due for routine blood work today which we ordered.  She is due for EGD to survey varices in August 2024.  In regards to [facility] screening, will await her MRCP which is scheduled for May to reevaluate her pancreatic cyst which will also screen her liver.  We discussed her pancreatic cyst is small, has been stable over time and hopefully continues to remain small. l will await results of her next exam and discuss if we can lengthen surveillance or stop.  Otherwise we may consider another colonoscopy in a few years to survey her colon for subepithelial benign lesion.  We reviewed but this is, endoscopic removal carries risks of bleeding/perforation, this appears to be a stable, benign-appearing growth over time and hopefully remains that way.  Plan: - lab today for CBC, LFTs, INR, AFP - recall MRCP in May 2022 - due for EGD 11/2022 - consider colonoscopy surveillance in a few years for colonic nodule - f/u every 6-12 months  Jolly Mango, MD [facility]

## 2021-05-23 NOTE — Patient Instructions (Addendum)
If you are age 63 or older, your body mass index should be between 23-30. Your Body mass index is 28.77 kg/m. If this is out of the aforementioned range listed, please consider follow up with your Primary Care Provider.  If you are age 40 or younger, your body mass index should be between 19-25. Your Body mass index is 28.77 kg/m. If this is out of the aformentioned range listed, please consider follow up with your Primary Care Provider.   ________________________________________________________  The [facility] providers would like to encourage you to use [facility] to communicate with providers for non-urgent requests or questions.  Due to long hold times on the telephone, sending your provider a message by [facility] may be a faster and more efficient way to get a response.  Please allow 48 business hours for a response.  Please remember that this is for non-urgent requests.  _______________________________________________________  Please go to the lab in the basement of our building to have lab work done as you leave today. Hit "B" for basement when you get on the elevator.  When the doors open the lab is on your left.  We will call you with the results. Thank you.  You will be due for a MRCP in May.  You will be due for a recall colonoscopy in 11-2022. We will send you a reminder in the mail when it gets closer to that time.  Follow up in 1 year.  Thank you for entrusting me with your care and for choosing [facility], Dr. [place] Cellar

## 2021-05-24 ENCOUNTER — Encounter: Payer: Self-pay | Admitting: Gastroenterology

## 2021-05-24 ENCOUNTER — Ambulatory Visit: Payer: BC Managed Care – PPO | Admitting: Family Medicine

## 2021-05-24 LAB — AFP TUMOR MARKER: AFP-Tumor Marker: 6 ng/mL

## 2021-07-31 ENCOUNTER — Other Ambulatory Visit: Payer: Self-pay | Admitting: Gastroenterology

## 2021-07-31 ENCOUNTER — Encounter (HOSPITAL_COMMUNITY): Payer: Self-pay | Admitting: Radiology

## 2021-07-31 ENCOUNTER — Ambulatory Visit (HOSPITAL_COMMUNITY): Admission: RE | Admit: 2021-07-31 | Payer: BC Managed Care – PPO | Source: Ambulatory Visit

## 2021-07-31 ENCOUNTER — Ambulatory Visit (HOSPITAL_COMMUNITY)
Admission: RE | Admit: 2021-07-31 | Discharge: 2021-07-31 | Disposition: A | Discharge status: Home / Self Care | Payer: BC Managed Care – PPO | Source: Ambulatory Visit | Attending: Gastroenterology | Admitting: Gastroenterology

## 2021-07-31 DIAGNOSIS — K862 Cyst of pancreas: Secondary | ICD-10-CM | POA: Diagnosis not present

## 2021-07-31 DIAGNOSIS — Z86010 Personal history of colonic polyps: Secondary | ICD-10-CM | POA: Diagnosis not present

## 2021-07-31 DIAGNOSIS — K7460 Unspecified cirrhosis of liver: Secondary | ICD-10-CM | POA: Insufficient documentation

## 2021-07-31 DIAGNOSIS — I700 Atherosclerosis of aorta: Secondary | ICD-10-CM | POA: Diagnosis not present

## 2021-07-31 DIAGNOSIS — R932 Abnormal findings on diagnostic imaging of liver and biliary tract: Secondary | ICD-10-CM | POA: Diagnosis not present

## 2021-07-31 MED ORDER — GADOBUTROL 1 MMOL/ML IV SOLN
7.0000 mL | Freq: Once | INTRAVENOUS | Status: AC | PRN
  Administered 2021-07-31: 7 mL via INTRAVENOUS

## 2021-08-01 ENCOUNTER — Telehealth: Payer: Self-pay | Admitting: Gastroenterology

## 2021-08-01 NOTE — Telephone Encounter (Signed)
Patient called requesting to speak with nurse regarding imaging results. I advised patient that once reviewed by doctor she will receive a call from [place] to discuss results.  ?

## 2021-08-01 NOTE — Telephone Encounter (Signed)
[place] can you help relay to the patient the following, I see that she has called in about her results: ?- stable appearance of lesion in the pancreas over > 5 years supports a benign lesion / non cancerous. Radiology is recommending a follow up MRCP in 2 years if you can place a recall. ?- she has changes suggestive of cirrhosis of the liver that we have known about. They see a small lesion in her liver which also appears benign. MRI in 2 years will also follow this up but we will do another [place] in 6 months as part of her routine surveillance of her liver. AFP is normal, I don't think this is something concerning like cancerous spot, etc, she has had similar lesions noted in the past.  ? ?Let me know if any questions, thanks ?

## 2021-08-01 NOTE — Telephone Encounter (Signed)
Pt returned call. We have reviewed her MRCP results and recommendations. Pt is aware that she will need her routine [place] in 6 months and our office will call closer to that time to remind her. She is aware that we will repeat MRCP in 2 years. Pt verbalized understanding and had no concerns at the end of the call. ?

## 2021-08-01 NOTE — Telephone Encounter (Signed)
Lm on vm for patient to return call. ?68-monthRUQ [place]min[place] in epic. ?2-year MRCP reminder in epic. ?

## 2021-08-01 NOTE — Telephone Encounter (Signed)
Patient called again requesting to speak with Dr. Havery Moros about the imaging results from yesterday.  It was explained to her someone would be in contact with her once the doctor had reviewed the results. ?

## 2021-08-17 ENCOUNTER — Other Ambulatory Visit: Payer: Self-pay | Admitting: *Deleted

## 2021-08-17 DIAGNOSIS — N1831 Chronic kidney disease, stage 3a: Secondary | ICD-10-CM

## 2021-08-17 DIAGNOSIS — I10 Essential (primary) hypertension: Secondary | ICD-10-CM

## 2021-08-17 MED ORDER — LISINOPRIL-HYDROCHLOROTHIAZIDE 20-25 MG PO TABS: 1.0000 | ORAL_TABLET | Freq: Every day | ORAL | 3 refills | Status: DC

## 2021-09-24 ENCOUNTER — Encounter: Payer: Self-pay | Admitting: *Deleted

## 2021-10-04 ENCOUNTER — Ambulatory Visit: Payer: BC Managed Care – PPO | Admitting: Orthopaedic Surgery

## 2021-10-04 DIAGNOSIS — G5603 Carpal tunnel syndrome, bilateral upper limbs: Secondary | ICD-10-CM | POA: Diagnosis not present

## 2021-10-04 DIAGNOSIS — G5602 Carpal tunnel syndrome, left upper limb: Secondary | ICD-10-CM | POA: Insufficient documentation

## 2021-10-04 DIAGNOSIS — G5601 Carpal tunnel syndrome, right upper limb: Secondary | ICD-10-CM | POA: Insufficient documentation

## 2021-10-04 NOTE — Progress Notes (Signed)
Office Visit Note   Patient: Stacy Thomas           Date of Birth: 12/23/1960           MRN: [id number] Visit Date: 10/04/2021              Requested by: Drucie Opitz, MD [address] [place],   [id number] PCP: Drucie Opitz, MD   Assessment & Plan: Visit Diagnoses:  1. Right carpal tunnel syndrome   2. Left carpal tunnel syndrome     Plan: Impression is bilateral carpal tunnel syndrome.  Her symptoms have worsened.  Treatment options were again discussed in detail and she would like to move forward with a right carpal tunnel release and left carpal tunnel injection in the operating theater.  Risk benefits prognosis reviewed with the patient.  Questions encouraged and answered.  Jackelyn Poling will call the patient  Follow-Up Instructions: No follow-ups on file.   Orders:  No orders of the defined types were placed in this encounter.  No orders of the defined types were placed in this encounter.     Procedures: No procedures performed   Clinical Data: No additional findings.   Subjective: Chief Complaint  Patient presents with   Right Hand - Pain    HPI Stacy Thomas comes in for bilateral hand pain.  Had nerve conduction studies in 2017 which showed bilateral moderate carpal tunnel syndrome.  She has had cortisone injections with temporary relief. Review of Systems  Constitutional: Negative.   HENT: Negative.    Eyes: Negative.   Respiratory: Negative.    Cardiovascular: Negative.   Endocrine: Negative.   Musculoskeletal: Negative.   Neurological: Negative.   Hematological: Negative.   Psychiatric/Behavioral: Negative.    All other systems reviewed and are negative.    Objective: Vital Signs: LMP 12/02/2010   Physical Exam Vitals and nursing note reviewed.  Constitutional:      Appearance: She is well-developed.  Pulmonary:     Effort: Pulmonary effort is normal.  Skin:    General: Skin is warm.     Capillary Refill: Capillary refill takes less than  2 seconds.  Neurological:     Mental Status: She is alert and oriented to person, place, and time.  Psychiatric:        Behavior: Behavior normal.        Thought Content: Thought content normal.        Judgment: Judgment normal.     Ortho Exam Examination of bilateral hands shows positive carpal signs. Specialty Comments:  No specialty comments available.  Imaging: No results found.   PMFS History: Patient Active Problem List   Diagnosis Date Noted   Right carpal tunnel syndrome 10/04/2021   Left carpal tunnel syndrome 10/04/2021   Strain of neck muscle 02/23/2021   Bilateral hand numbness 02/23/2021   Prediabetes 02/23/2021   Hair loss 02/23/2021   Memory changes 03/01/2020   CKD (chronic kidney disease) 09/11/2019   HLD (hyperlipidemia) 09/09/2018   Allergic sinusitis 12/03/2017   Osteoarthritis of right hip 12/03/2017   Hx of AKA (above knee amputation), left ([place]) 02/18/2017   Unilateral primary osteoarthritis, right knee 02/18/2017   Vitamin D deficiency 05/04/2016   Bilateral primary osteoarthritis of hip 04/26/2016   Bilateral carpal tunnel syndrome 03/29/2016   Screening for cervical cancer 03/06/2016   PVD (peripheral vascular disease) ([place]) 03/05/2016   Pancreatic abnormality 03/05/2016   HTN (hypertension) 03/05/2016   Hepatic cirrhosis ([place]) 11/15/2015   Chronic hepatitis C (  [place]) 06/15/2015   Prolapsed internal hemorrhoids, grade 4 06/14/2015   Hx of osteosarcoma 06/14/2015   Past Medical History:  Diagnosis Date   Bilateral arm pain    chronic, due to use of crutches   Dependence on crutches    left leg prosthesis   Drug addiction in remission ([place])    since 2007  (crack cocaine,  IV drug use)   Hand weakness    Hepatic cirrhosis ([place]) 2018   followed by dr Havery Moros (gi) due to hx chronic hepatitis c   Hiatal hernia    History of esophageal stricture    post dilation x2   History of hepatitis C    pt completed harvoni treatment 2017, cured;   genotype 1a with fibrosis 3/4,  previously seen by dr comer   History of osteosarcoma    1982  left knee---  s/p  AKA LLE--   per pt no recurrence   History of recreational drug use multiple rehab visits   IV drug use for 1 yr and Smoked crack for 30+yrs--  per pt clean since 2007   Hyperlipidemia    Hypertension    Idiopathic peripheral neuropathy    OSA (obstructive sleep apnea)    study in epic 10-27-2018 by dr Rexene Alberts,  moderate osa,  pt stated used cpap until approx. 07/ 2021, intolerate of mask   Osteoarthritis    Pancreatic cyst    followed by dr Havery Moros--- per office note bening and stable   Pre-diabetes    Sigmoid diverticulosis    mild   Wears glasses     Family History  Problem Relation Age of Onset   COPD Father    Alcohol abuse Father        Deceased   Hypertension Mother        Living   Healthy Daughter    Healthy Son    Anesthesia problems Neg Hx    Breast cancer Neg Hx    Colon cancer Neg Hx    Esophageal cancer Neg Hx    Rectal cancer Neg Hx    Stomach cancer Neg Hx    Colon polyps Neg Hx     Past Surgical History:  Procedure Laterality Date   ABOVE KNEE LEG AMPUTATION Left 1982   osteosarcoma   COLONOSCOPY WITH PROPOFOL  last one 04-28-2020  dr Havery Moros   ESOPHAGOGASTRODUODENOSCOPY (EGD) WITH PROPOFOL  last one 11-02-2019  dr Havery Moros   EXCISION BREAST BX  2015   CYST   EXCISION OF SKIN TAG N/A 07/14/2020   Procedure: EXCISION OF ANAL PAPILLA;  Surgeon: Leighton Ruff, MD;  Location: [facility];  Service: General;  Laterality: N/A;   TONSILLECTOMY  as child   TRANSANAL HEMORRHOIDAL DEARTERIALIZATION N/A 10/27/2015   Procedure: TRANSANAL HEMORRHOIDAL DEARTERIALIZATION;  Surgeon: Michael Boston, MD;  Location: [facility];  Service: General;  Laterality: N/A;   UPPER GASTROINTESTINAL ENDOSCOPY     Social History   Occupational History   Not on file  Tobacco Use   Smoking status: Former    Packs/day: 1.00     Years: 37.00    Total pack years: 37.00    Types: Cigarettes    Start date: 04/02/1973    Quit date: 12/19/2014    Years since quitting: 6.7   Smokeless tobacco: Never  Vaping Use   Vaping Use: Never used  Substance and Sexual Activity   Alcohol use: Never    Alcohol/week: 0.0 standard drinks of alcohol  Drug use: Not Currently    Types: "Crack" cocaine    Comment: recovery addict since 2007   Sexual activity: Not on file

## 2021-10-13 DIAGNOSIS — G4733 Obstructive sleep apnea (adult) (pediatric): Secondary | ICD-10-CM | POA: Diagnosis not present

## 2021-10-17 ENCOUNTER — Encounter (HOSPITAL_BASED_OUTPATIENT_CLINIC_OR_DEPARTMENT_OTHER)
Admission: RE | Admit: 2021-10-17 | Discharge: 2021-10-17 | Disposition: A | Discharge status: Home / Self Care | Payer: BC Managed Care – PPO | Source: Ambulatory Visit | Attending: Orthopaedic Surgery | Admitting: Orthopaedic Surgery

## 2021-10-17 ENCOUNTER — Encounter (HOSPITAL_BASED_OUTPATIENT_CLINIC_OR_DEPARTMENT_OTHER): Payer: Self-pay | Admitting: Orthopaedic Surgery

## 2021-10-17 ENCOUNTER — Other Ambulatory Visit: Payer: Self-pay

## 2021-10-17 DIAGNOSIS — M1990 Unspecified osteoarthritis, unspecified site: Secondary | ICD-10-CM | POA: Diagnosis not present

## 2021-10-17 DIAGNOSIS — Z89512 Acquired absence of left leg below knee: Secondary | ICD-10-CM | POA: Diagnosis not present

## 2021-10-17 DIAGNOSIS — Z01818 Encounter for other preprocedural examination: Secondary | ICD-10-CM | POA: Insufficient documentation

## 2021-10-17 DIAGNOSIS — I10 Essential (primary) hypertension: Secondary | ICD-10-CM | POA: Insufficient documentation

## 2021-10-17 DIAGNOSIS — Z85830 Personal history of malignant neoplasm of bone: Secondary | ICD-10-CM | POA: Diagnosis not present

## 2021-10-17 DIAGNOSIS — Z79899 Other long term (current) drug therapy: Secondary | ICD-10-CM | POA: Diagnosis not present

## 2021-10-17 DIAGNOSIS — G709 Myoneural disorder, unspecified: Secondary | ICD-10-CM | POA: Diagnosis not present

## 2021-10-17 DIAGNOSIS — G4733 Obstructive sleep apnea (adult) (pediatric): Secondary | ICD-10-CM | POA: Diagnosis not present

## 2021-10-17 DIAGNOSIS — G5603 Carpal tunnel syndrome, bilateral upper limbs: Secondary | ICD-10-CM | POA: Diagnosis not present

## 2021-10-17 DIAGNOSIS — Z87891 Personal history of nicotine dependence: Secondary | ICD-10-CM | POA: Diagnosis not present

## 2021-10-17 LAB — BASIC METABOLIC PANEL
Anion gap: 11 (ref 5–15)
BUN: 14 mg/dL (ref 8–23)
CO2: 24 mmol/L (ref 22–32)
Calcium: 9.9 mg/dL (ref 8.9–10.3)
Chloride: 107 mmol/L (ref 98–111)
Creatinine, Ser: 0.99 mg/dL (ref 0.44–1.00)
GFR, Estimated: >60 mL/min (ref >60)
Glucose, Bld: 88 mg/dL (ref 70–99)
Potassium: 4.5 mmol/L (ref 3.5–5.1)
Sodium: 142 mmol/L (ref 135–145)

## 2021-10-18 ENCOUNTER — Ambulatory Visit (HOSPITAL_BASED_OUTPATIENT_CLINIC_OR_DEPARTMENT_OTHER): Payer: BC Managed Care – PPO | Admitting: Anesthesiology

## 2021-10-18 ENCOUNTER — Encounter (HOSPITAL_BASED_OUTPATIENT_CLINIC_OR_DEPARTMENT_OTHER)
Admission: RE | Disposition: A | Discharge status: Home / Self Care | Payer: Self-pay | Source: Home / Self Care | Attending: Orthopaedic Surgery

## 2021-10-18 ENCOUNTER — Encounter (HOSPITAL_BASED_OUTPATIENT_CLINIC_OR_DEPARTMENT_OTHER): Payer: Self-pay | Admitting: Orthopaedic Surgery

## 2021-10-18 ENCOUNTER — Ambulatory Visit (HOSPITAL_BASED_OUTPATIENT_CLINIC_OR_DEPARTMENT_OTHER)
Admission: RE | Admit: 2021-10-18 | Discharge: 2021-10-18 | Disposition: A | Discharge status: Home / Self Care | Payer: BC Managed Care – PPO | Attending: Orthopaedic Surgery | Admitting: Orthopaedic Surgery

## 2021-10-18 DIAGNOSIS — Z79899 Other long term (current) drug therapy: Secondary | ICD-10-CM | POA: Insufficient documentation

## 2021-10-18 DIAGNOSIS — M1990 Unspecified osteoarthritis, unspecified site: Secondary | ICD-10-CM | POA: Insufficient documentation

## 2021-10-18 DIAGNOSIS — Z89512 Acquired absence of left leg below knee: Secondary | ICD-10-CM | POA: Insufficient documentation

## 2021-10-18 DIAGNOSIS — G709 Myoneural disorder, unspecified: Secondary | ICD-10-CM | POA: Diagnosis not present

## 2021-10-18 DIAGNOSIS — Z85830 Personal history of malignant neoplasm of bone: Secondary | ICD-10-CM | POA: Diagnosis not present

## 2021-10-18 DIAGNOSIS — I10 Essential (primary) hypertension: Secondary | ICD-10-CM | POA: Insufficient documentation

## 2021-10-18 DIAGNOSIS — G4733 Obstructive sleep apnea (adult) (pediatric): Secondary | ICD-10-CM | POA: Insufficient documentation

## 2021-10-18 DIAGNOSIS — Z87891 Personal history of nicotine dependence: Secondary | ICD-10-CM | POA: Diagnosis not present

## 2021-10-18 DIAGNOSIS — G5603 Carpal tunnel syndrome, bilateral upper limbs: Secondary | ICD-10-CM | POA: Diagnosis not present

## 2021-10-18 DIAGNOSIS — G5601 Carpal tunnel syndrome, right upper limb: Secondary | ICD-10-CM

## 2021-10-18 DIAGNOSIS — G4730 Sleep apnea, unspecified: Secondary | ICD-10-CM | POA: Diagnosis not present

## 2021-10-18 DIAGNOSIS — G5602 Carpal tunnel syndrome, left upper limb: Secondary | ICD-10-CM

## 2021-10-18 HISTORY — PX: STERIOD INJECTION: SHX5046

## 2021-10-18 HISTORY — PX: CARPAL TUNNEL RELEASE: SHX101

## 2021-10-18 SURGERY — CARPAL TUNNEL RELEASE
Anesthesia: Monitor Anesthesia Care | Site: Wrist | Laterality: Right

## 2021-10-18 MED ORDER — CEFAZOLIN SODIUM-DEXTROSE 2-4 GM/100ML-% IV SOLN
2.0000 g | INTRAVENOUS | Status: AC
  Administered 2021-10-18: 2 g via INTRAVENOUS

## 2021-10-18 MED ORDER — METHYLPREDNISOLONE ACETATE 40 MG/ML IJ SUSP
INTRAMUSCULAR | Status: AC
  Filled 2021-10-18: qty 1

## 2021-10-18 MED ORDER — BUPIVACAINE HCL (PF) 0.25 % IJ SOLN
INTRAMUSCULAR | Status: AC
  Filled 2021-10-18: qty 30

## 2021-10-18 MED ORDER — CELECOXIB 200 MG PO CAPS
ORAL_CAPSULE | ORAL | Status: AC
  Filled 2021-10-18: qty 1

## 2021-10-18 MED ORDER — BUPIVACAINE-EPINEPHRINE (PF) 0.25% -1:200000 IJ SOLN
INTRAMUSCULAR | Status: DC | PRN
  Administered 2021-10-18: 10 mL

## 2021-10-18 MED ORDER — LIDOCAINE HCL (PF) 1 % IJ SOLN
INTRAMUSCULAR | Status: DC | PRN
  Administered 2021-10-18: 1 mL

## 2021-10-18 MED ORDER — PROPOFOL 10 MG/ML IV BOLUS
INTRAVENOUS | Status: AC
  Filled 2021-10-18: qty 20

## 2021-10-18 MED ORDER — LIDOCAINE HCL (PF) 1 % IJ SOLN
INTRAMUSCULAR | Status: AC
  Filled 2021-10-18: qty 5

## 2021-10-18 MED ORDER — MIDAZOLAM HCL 2 MG/2ML IJ SOLN
INTRAMUSCULAR | Status: AC
  Filled 2021-10-18: qty 2

## 2021-10-18 MED ORDER — ONDANSETRON HCL 4 MG/2ML IJ SOLN
INTRAMUSCULAR | Status: AC
  Filled 2021-10-18: qty 2

## 2021-10-18 MED ORDER — PROPOFOL 10 MG/ML IV BOLUS
INTRAVENOUS | Status: DC | PRN
  Administered 2021-10-18 (×2): 20 mg via INTRAVENOUS

## 2021-10-18 MED ORDER — ACETAMINOPHEN 500 MG PO TABS
1000.0000 mg | ORAL_TABLET | Freq: Once | ORAL | Status: AC
  Administered 2021-10-18: 1000 mg via ORAL

## 2021-10-18 MED ORDER — PROPOFOL 500 MG/50ML IV EMUL
INTRAVENOUS | Status: AC
  Filled 2021-10-18: qty 50

## 2021-10-18 MED ORDER — FENTANYL CITRATE (PF) 100 MCG/2ML IJ SOLN
INTRAMUSCULAR | Status: AC
  Filled 2021-10-18: qty 2

## 2021-10-18 MED ORDER — PROMETHAZINE HCL 25 MG/ML IJ SOLN: 6.2500 mg | INTRAMUSCULAR | Status: DC | PRN

## 2021-10-18 MED ORDER — EPHEDRINE SULFATE (PRESSORS) 50 MG/ML IJ SOLN
INTRAMUSCULAR | Status: DC | PRN
  Administered 2021-10-18 (×2): 5 mg via INTRAVENOUS
  Administered 2021-10-18: 10 mg via INTRAVENOUS
  Administered 2021-10-18: 5 mg via INTRAVENOUS

## 2021-10-18 MED ORDER — PROPOFOL 500 MG/50ML IV EMUL
INTRAVENOUS | Status: DC | PRN
  Administered 2021-10-18: 125 ug/kg/min via INTRAVENOUS

## 2021-10-18 MED ORDER — CEFAZOLIN SODIUM-DEXTROSE 2-4 GM/100ML-% IV SOLN
INTRAVENOUS | Status: AC
  Filled 2021-10-18: qty 100

## 2021-10-18 MED ORDER — CELECOXIB 200 MG PO CAPS
200.0000 mg | ORAL_CAPSULE | Freq: Once | ORAL | Status: AC
  Administered 2021-10-18: 200 mg via ORAL

## 2021-10-18 MED ORDER — AMISULPRIDE (ANTIEMETIC) 5 MG/2ML IV SOLN: 10.0000 mg | Freq: Once | INTRAVENOUS | Status: DC | PRN

## 2021-10-18 MED ORDER — FENTANYL CITRATE (PF) 100 MCG/2ML IJ SOLN
INTRAMUSCULAR | Status: DC | PRN
  Administered 2021-10-18: 50 ug via INTRAVENOUS

## 2021-10-18 MED ORDER — LIDOCAINE HCL (PF) 1 % IJ SOLN
INTRAMUSCULAR | Status: AC
Start: 2021-10-18 — End: ?
  Filled 2021-10-18: qty 30

## 2021-10-18 MED ORDER — METHYLPREDNISOLONE ACETATE 40 MG/ML IJ SUSP
INTRAMUSCULAR | Status: DC | PRN
  Administered 2021-10-18: 40 mg via INTRA_ARTICULAR

## 2021-10-18 MED ORDER — MIDAZOLAM HCL 2 MG/2ML IJ SOLN
INTRAMUSCULAR | Status: DC | PRN
  Administered 2021-10-18: 2 mg via INTRAVENOUS

## 2021-10-18 MED ORDER — HYDROCODONE-ACETAMINOPHEN 5-325 MG PO TABS: 1.0000 | ORAL_TABLET | Freq: Four times a day (QID) | ORAL | 0 refills | Status: DC | PRN

## 2021-10-18 MED ORDER — LIDOCAINE-EPINEPHRINE (PF) 1 %-1:200000 IJ SOLN
INTRAMUSCULAR | Status: AC
  Filled 2021-10-18: qty 30

## 2021-10-18 MED ORDER — BUPIVACAINE-EPINEPHRINE (PF) 0.25% -1:200000 IJ SOLN
INTRAMUSCULAR | Status: AC
  Filled 2021-10-18: qty 30

## 2021-10-18 MED ORDER — ONDANSETRON HCL 4 MG/2ML IJ SOLN
INTRAMUSCULAR | Status: DC | PRN
  Administered 2021-10-18: 4 mg via INTRAVENOUS

## 2021-10-18 MED ORDER — LACTATED RINGERS IV SOLN
INTRAVENOUS | Status: DC
Start: 2021-10-18 — End: 2021-10-18

## 2021-10-18 MED ORDER — FENTANYL CITRATE (PF) 100 MCG/2ML IJ SOLN: 25.0000 ug | INTRAMUSCULAR | Status: DC | PRN

## 2021-10-18 MED ORDER — 0.9 % SODIUM CHLORIDE (POUR BTL) OPTIME
TOPICAL | Status: DC | PRN
  Administered 2021-10-18: 100 mL

## 2021-10-18 MED ORDER — ACETAMINOPHEN 500 MG PO TABS
ORAL_TABLET | ORAL | Status: AC
  Filled 2021-10-18: qty 2

## 2021-10-18 MED ORDER — LACTATED RINGERS IV SOLN: INTRAVENOUS | Status: DC

## 2021-10-18 MED ORDER — BUPIVACAINE HCL (PF) 0.25 % IJ SOLN
INTRAMUSCULAR | Status: DC | PRN
  Administered 2021-10-18: 1 mL via INTRA_ARTICULAR

## 2021-10-18 MED ORDER — LIDOCAINE HCL (PF) 1 % IJ SOLN
INTRAMUSCULAR | Status: AC
  Filled 2021-10-18: qty 30

## 2021-10-18 MED ORDER — EPHEDRINE 5 MG/ML INJ
INTRAVENOUS | Status: AC
  Filled 2021-10-18: qty 5

## 2021-10-18 SURGICAL SUPPLY — 59 items
BAND INSRT 18 STRL LF DISP RB (MISCELLANEOUS) ×4
BAND RUBBER #18 3X1/16 STRL (MISCELLANEOUS) ×8 IMPLANT
BLADE MINI RND TIP GREEN BEAV (BLADE) ×4 IMPLANT
BLADE SURG 15 STRL LF DISP TIS (BLADE) ×3 IMPLANT
BLADE SURG 15 STRL SS (BLADE) ×3
BNDG ADH 1X3 SHEER STRL LF (GAUZE/BANDAGES/DRESSINGS) ×8 IMPLANT
BNDG ADH THN 3X1 STRL LF (GAUZE/BANDAGES/DRESSINGS) ×4
BNDG CMPR 9X4 STRL LF SNTH (GAUZE/BANDAGES/DRESSINGS) ×2
BNDG ELASTIC 3X5.8 VLCR STR LF (GAUZE/BANDAGES/DRESSINGS) ×4 IMPLANT
BNDG ESMARK 4X9 LF (GAUZE/BANDAGES/DRESSINGS) ×4 IMPLANT
BNDG PLASTER X FAST 3X3 WHT LF (CAST SUPPLIES) IMPLANT
BNDG PLSTR 9X3 FST ST WHT (CAST SUPPLIES)
BRUSH SCRUB EZ PLAIN DRY (MISCELLANEOUS) ×4 IMPLANT
CANISTER SUCT 1200ML W/VALVE (MISCELLANEOUS) ×4 IMPLANT
CORD BIPOLAR FORCEPS 12FT (ELECTRODE) ×4 IMPLANT
COVER BACK TABLE 60X90IN (DRAPES) ×4 IMPLANT
COVER MAYO STAND STRL (DRAPES) ×4 IMPLANT
CUFF TOURN SGL QUICK 18X4 (TOURNIQUET CUFF) ×1 IMPLANT
DRAPE EXTREMITY T 121X128X90 (DISPOSABLE) ×4 IMPLANT
DRAPE IMP U-DRAPE 54X76 (DRAPES) ×4 IMPLANT
DRAPE SURG 17X23 STRL (DRAPES) ×4 IMPLANT
GAUZE 4X4 16PLY ~~LOC~~+RFID DBL (SPONGE) IMPLANT
GAUZE SPONGE 4X4 12PLY STRL (GAUZE/BANDAGES/DRESSINGS) ×4 IMPLANT
GAUZE XEROFORM 1X8 LF (GAUZE/BANDAGES/DRESSINGS) ×4 IMPLANT
GLOVE BIOGEL PI IND STRL 7.5 (GLOVE) ×3 IMPLANT
GLOVE BIOGEL PI INDICATOR 7.5 (GLOVE) ×1
GLOVE ECLIPSE 7.0 STRL STRAW (GLOVE) ×4 IMPLANT
GLOVE INDICATOR 7.0 STRL GRN (GLOVE) ×4 IMPLANT
GLOVE INDICATOR 7.5 STRL GRN (GLOVE) ×4 IMPLANT
GLOVE SURG SYN 7.5  E (GLOVE) ×3
GLOVE SURG SYN 7.5 E (GLOVE) ×2 IMPLANT
GLOVE SURG SYN 7.5 PF PI (GLOVE) ×3 IMPLANT
GOWN STRL REIN XL XLG (GOWN DISPOSABLE) ×8 IMPLANT
GOWN STRL REUS W/ TWL LRG LVL3 (GOWN DISPOSABLE) ×3 IMPLANT
GOWN STRL REUS W/TWL LRG LVL3 (GOWN DISPOSABLE) ×3
NDL HYPO 25X1 1.5 SAFETY (NEEDLE) ×3 IMPLANT
NDL HYPO 27GX1-1/4 (NEEDLE) IMPLANT
NDL SAFETY ECLIPSE 18X1.5 (NEEDLE) ×3 IMPLANT
NEEDLE HYPO 18GX1.5 SHARP (NEEDLE) ×3
NEEDLE HYPO 25X1 1.5 SAFETY (NEEDLE) ×3 IMPLANT
NEEDLE HYPO 27GX1-1/4 (NEEDLE) IMPLANT
NS IRRIG 1000ML POUR BTL (IV SOLUTION) ×4 IMPLANT
PACK BASIN DAY SURGERY FS (CUSTOM PROCEDURE TRAY) ×4 IMPLANT
PAD ALCOHOL SWAB (MISCELLANEOUS) ×8 IMPLANT
PAD CAST 3X4 CTTN HI CHSV (CAST SUPPLIES) ×3 IMPLANT
PADDING CAST COTTON 3X4 STRL (CAST SUPPLIES) ×3
SHEET MEDIUM DRAPE 40X70 STRL (DRAPES) ×4 IMPLANT
SPIKE FLUID TRANSFER (MISCELLANEOUS) IMPLANT
STOCKINETTE 4X48 STRL (DRAPES) ×4 IMPLANT
SUT ETHILON 3 0 PS 1 (SUTURE) ×4 IMPLANT
SUT ETHILON 4 0 PS 2 18 (SUTURE) ×1 IMPLANT
SWABSTICK POVIDONE IODINE SNGL (MISCELLANEOUS) ×9 IMPLANT
SYR 3ML 23GX1 SAFETY (SYRINGE) ×4 IMPLANT
SYR BULB EAR ULCER 3OZ GRN STR (SYRINGE) ×4 IMPLANT
SYR CONTROL 10ML LL (SYRINGE) ×1 IMPLANT
TOWEL GREEN STERILE FF (TOWEL DISPOSABLE) ×4 IMPLANT
TRAY DSU PREP LF (CUSTOM PROCEDURE TRAY) ×4 IMPLANT
TUBE CONNECTING 20X1/4 (TUBING) IMPLANT
UNDERPAD 30X36 HEAVY ABSORB (UNDERPADS AND DIAPERS) ×4 IMPLANT

## 2021-10-18 NOTE — Progress Notes (Signed)
Called OR to verify if patient can leave her prosthetic leg on. Tammy from OR said that it was okay

## 2021-10-18 NOTE — Anesthesia Postprocedure Evaluation (Signed)
Anesthesia Post Note  Patient: Stacy Thomas  Procedure(s) Performed: RIGHT CARPAL TUNNEL RELEASE (Right: Wrist) LEFT CARPAL TUNNEL INJECTION (Left: Wrist)     Patient location during evaluation: PACU Anesthesia Type: MAC Level of consciousness: awake and alert Pain management: pain level controlled Vital Signs Assessment: post-procedure vital signs reviewed and stable Respiratory status: spontaneous breathing and respiratory function stable Cardiovascular status: stable Postop Assessment: no apparent nausea or vomiting Anesthetic complications: no   No notable events documented.  Last Vitals:  Vitals:   10/18/21 1300 10/18/21 1315  BP: 117/73 121/87  Pulse: 69 74  Resp: (!) 22 19  Temp:    SpO2: 99% 98%    Last Pain:  Vitals:   10/18/21 1315  TempSrc:   PainSc: 0-No pain                 [name] DANIEL

## 2021-10-18 NOTE — Op Note (Signed)
   Carpal tunnel op note  DATE OF SURGERY:10/18/2021  PREOPERATIVE DIAGNOSIS:  Bilateral carpal tunnel syndrome  POSTOPERATIVE DIAGNOSIS: same  PROCEDURE:  Right carpal tunnel release Left carpal tunnel injection  SURGEON: Lantz Hermann Eduard Roux, M.D.  ASSIST: Madalyn Rob, PA-C  ANESTHESIA:  Local and MAC  TOURNIQUET TIME: less than 20 minutes  BLOOD LOSS: Minimal.  COMPLICATIONS: None.  PATHOLOGY: None.  INDICATIONS: The patient is a 61 y.o. -year-old female who presented with bilateral carpal tunnel syndrome failing nonsurgical management, indicated for surgical release.  DESCRIPTION OF PROCEDURE: The patient was identified in the preoperative holding area.  The operative site was marked by the surgeon and confirmed by the patient.  The patient was brought back to the operating room.  MAC anesthesia was administered.  Local anesthetic with epi was injected into the operative site.  A well padded nonsterile tourniquet was placed. The operative extremity was prepped and draped in standard sterile fashion.  A timeout was performed.  Preoperative antibiotics were given.   A palmar incision was made about 5 mm ulnar to the thenar crease.  The palmar aponeurosis was exposed and divided in line with the skin incision. The palmaris brevis was visualized and divided.  The distal edge of the transcarpal ligament was identified. A hemostat was inserted into the carpal tunnel to protect the median nerve and the flexor tendons. Then, the transverse carpal ligament was released under direct visualization. Proximally, a subcutaneous tunnel was made allowing a Sewell retractor to be placed. Then, the distal portion of the antebrachial fascia was released. Distally, all fibrous bands were released. Wound was irrigated and closed with 4-0 nylon sutures. Sterile dressing applied.  Then under sterile conditions, a left carpal tunnel injection was performed under sterile conditions with 1 cc each of  depomedrol, marcaine and lidocaine.  Band aid was applied.  The patient was transferred to the recovery room in stable condition after all counts were correct.  POSTOPERATIVE PLAN: To start nerve gliding exercises as tolerated and no heavy lifting for four weeks.  Eduard Roux, M.D. [facility] 12:36 PM

## 2021-10-18 NOTE — Transfer of Care (Signed)
Immediate Anesthesia Transfer of Care Note  Patient: Stacy Thomas  Procedure(s) Performed: RIGHT CARPAL TUNNEL RELEASE (Right: Wrist) LEFT CARPAL TUNNEL INJECTION (Left: Wrist)  Patient Location: PACU  Anesthesia Type:MAC  Level of Consciousness: awake, alert  and oriented  Airway & Oxygen Therapy: Patient Spontanous Breathing and Patient connected to face mask oxygen  Post-op Assessment: Report given to RN and Post -op Vital signs reviewed and stable  Post vital signs: Reviewed and stable  Last Vitals:  Vitals Value Taken Time  BP 117/66 10/18/21 1247  Temp    Pulse 72 10/18/21 1250  Resp 22 10/18/21 1250  SpO2 97 % 10/18/21 1250  Vitals shown include unvalidated device data.  Last Pain:  Vitals:   10/18/21 1054  TempSrc: Oral  PainSc: 0-No pain      Patients Stated Pain Goal: 9 (19/50/93 2671)  Complications: No notable events documented.

## 2021-10-18 NOTE — Discharge Instructions (Addendum)
Postoperative instructions:  Weightbearing instructions: no heavy lifting  Dressing instructions: Keep your dressing and/or splint clean and dry at all times.  It will be removed at your first post-operative appointment.  Your stitches and/or staples will be removed at this visit.  Incision instructions:  Do not soak your incision for 3 weeks after surgery.  If the incision gets wet, pat dry and do not scrub the incision.  Pain control:  You have been given a prescription to be taken as directed for post-operative pain control.  In addition, elevate the operative extremity above the heart at all times to prevent swelling and throbbing pain.  Take over-the-counter Colace, '100mg'$  by mouth twice a day while taking narcotic pain medications to help prevent constipation.  Follow up appointments: 1) 7 days for wound check. 2) Dr. Erlinda Hong as scheduled.   -------------------------------------------------------------------------------------------------------------  After Surgery Pain Control:  After your surgery, post-surgical discomfort or pain is likely. This discomfort can last several days to a few weeks. At certain times of the day your discomfort may be more intense.  Did you receive a nerve block?  A nerve block can provide pain relief for one hour to two days after your surgery. As long as the nerve block is working, you will experience little or no sensation in the area the surgeon operated on.  As the nerve block wears off, you will begin to experience pain or discomfort. It is very important that you begin taking your prescribed pain medication before the nerve block fully wears off. Treating your pain at the first sign of the block wearing off will ensure your pain is better controlled and more tolerable when full-sensation returns. Do not wait until the pain is intolerable, as the medicine will be less effective. It is better to treat pain in advance than to try and catch up.  General  Anesthesia:  If you did not receive a nerve block during your surgery, you will need to start taking your pain medication shortly after your surgery and should continue to do so as prescribed by your surgeon.  Pain Medication:  Most commonly we prescribe Vicodin and Percocet for post-operative pain. Both of these medications contain a combination of acetaminophen (Tylenol) and a narcotic to help control pain.   It takes between 30 and 45 minutes before pain medication starts to work. It is important to take your medication before your pain level gets too intense.   Nausea is a common side effect of many pain medications. You will want to eat something before taking your pain medicine to help prevent nausea.   If you are taking a prescription pain medication that contains acetaminophen, we recommend that you do not take additional over the counter acetaminophen (Tylenol).  Other pain relieving options:   Using a cold pack to ice the affected area a few times a day (15 to 20 minutes at a time) can help to relieve pain, reduce swelling and bruising.   Elevation of the affected area can also help to reduce pain and swelling.     No Tylenol before 5:00pm if needed. No ibuprofen before 7:00pm if needed.  Post Anesthesia Home Care Instructions  Activity: Get plenty of rest for the remainder of the day. A responsible individual must stay with you for 24 hours following the procedure.  For the next 24 hours, DO NOT: -Drive a car -Paediatric nurse -Drink alcoholic beverages -Take any medication unless instructed by your physician -Make any legal decisions or sign  important papers.  Meals: Start with liquid foods such as gelatin or soup. Progress to regular foods as tolerated. Avoid greasy, spicy, heavy foods. If nausea and/or vomiting occur, drink only clear liquids until the nausea and/or vomiting subsides. Call your physician if vomiting continues.  Special Instructions/Symptoms: Your  throat may feel dry or sore from the anesthesia or the breathing tube placed in your throat during surgery. If this causes discomfort, gargle with warm salt water. The discomfort should disappear within 24 hours.  If you had a scopolamine patch placed behind your ear for the management of post- operative nausea and/or vomiting:  1. The medication in the patch is effective for 72 hours, after which it should be removed.  Wrap patch in a tissue and discard in the trash. Wash hands thoroughly with soap and water. 2. You may remove the patch earlier than 72 hours if you experience unpleasant side effects which may include dry mouth, dizziness or visual disturbances. 3. Avoid touching the patch. Wash your hands with soap and water after contact with the patch.       Call your surgeon if you experience:   1.  Fever over 101.0. 2.  Inability to urinate. 3.  Nausea and/or vomiting. 4.  Extreme swelling or bruising at the surgical site. 5.  Continued bleeding from the incision. 6.  Increased pain, redness or drainage from the incision. 7.  Problems related to your pain medication. 8.  Any problems and/or concerns

## 2021-10-18 NOTE — Anesthesia Preprocedure Evaluation (Addendum)
Anesthesia Evaluation  Patient identified by MRN, date of birth, ID band Patient awake  General Assessment Comment:S/P left BKA secondary to bone cancer 35 years ago.  Chronic pain issues in neck, back, stump, right hand. Took neurontin today.  Reviewed: Allergy & Precautions, NPO status , Patient's Chart, lab work & pertinent test results  History of Anesthesia Complications Negative for: history of anesthetic complications  Airway Mallampati: I  TM Distance: >3 FB Neck ROM: Full    Dental  (+) Poor Dentition, Edentulous Upper, Dental Advisory Given,    Pulmonary sleep apnea , former smoker,    Pulmonary exam normal        Cardiovascular hypertension, Pt. on medications Normal cardiovascular exam     Neuro/Psych  Neuromuscular disease negative psych ROS   GI/Hepatic hiatal hernia, (+)     substance abuse  , Hepatitis -, CCrack cocaine use many years ago. Chronic hepatitis with some abdominal pain. She avoids acetaminophen.   Endo/Other  negative endocrine ROS  Renal/GU negative Renal ROS  negative genitourinary   Musculoskeletal  (+) Arthritis ,   Abdominal   Peds negative pediatric ROS (+)  Hematology negative hematology ROS (+)   Anesthesia Other Findings   Reproductive/Obstetrics negative OB ROS                            Anesthesia Physical  Anesthesia Plan  ASA: 3  Anesthesia Plan: MAC   Post-op Pain Management: Celebrex PO (pre-op)*   Induction: Intravenous  PONV Risk Score and Plan: 3 and Ondansetron, Midazolam and TIVA  Airway Management Planned: Nasal Cannula, Natural Airway and Simple Face Mask  Additional Equipment: None  Intra-op Plan:   Post-operative Plan:   Informed Consent: I have reviewed the patients History and Physical, chart, labs and discussed the procedure including the risks, benefits and alternatives for the proposed anesthesia with the patient  or authorized representative who has indicated his/her understanding and acceptance.     Dental advisory given  Plan Discussed with: Anesthesiologist and CRNA  Anesthesia Plan Comments:        Anesthesia Quick Evaluation

## 2021-10-18 NOTE — H&P (Signed)
PREOPERATIVE H&P  Chief Complaint: right carpal tunnel syndrome, left carpal tunnel syndrome  HPI: Stacy Thomas is a 61 y.o. female who presents for surgical treatment of right carpal tunnel syndrome, left carpal tunnel syndrome.  She denies any changes in medical history.  Past Medical History:  Diagnosis Date   Bilateral arm pain    chronic, due to use of crutches   Dependence on crutches    left leg prosthesis   Drug addiction in remission ([place])    since 2007  (crack cocaine,  IV drug use)   Hand weakness    Hepatic cirrhosis ([place]) 2018   followed by dr Havery Moros (gi) due to hx chronic hepatitis c   Hiatal hernia    History of esophageal stricture    post dilation x2   History of hepatitis C    pt completed harvoni treatment 2017, cured;  genotype 1a with fibrosis 3/4,  previously seen by dr comer   History of osteosarcoma    1982  left knee---  s/p  AKA LLE--   per pt no recurrence   History of recreational drug use multiple rehab visits   IV drug use for 1 yr and Smoked crack for 30+yrs--  per pt clean since 2007   Hyperlipidemia    Hypertension    Idiopathic peripheral neuropathy    OSA (obstructive sleep apnea)    study in epic 10-27-2018 by dr Rexene Alberts,  moderate osa,  pt stated used cpap until approx. 07/ 2021, intolerate of mask   Osteoarthritis    Pancreatic cyst    followed by dr Havery Moros--- per office note bening and stable   Sigmoid diverticulosis    mild   Wears glasses    Past Surgical History:  Procedure Laterality Date   ABOVE KNEE LEG AMPUTATION Left 1982   osteosarcoma   COLONOSCOPY WITH PROPOFOL  last one 04-28-2020  dr Havery Moros   ESOPHAGOGASTRODUODENOSCOPY (EGD) WITH PROPOFOL  last one 11-02-2019  dr Havery Moros   EXCISION BREAST BX  2015   CYST   EXCISION OF SKIN TAG N/A 07/14/2020   Procedure: EXCISION OF ANAL PAPILLA;  Surgeon: Leighton Ruff, MD;  Location: [facility];  Service: General;  Laterality: N/A;    TONSILLECTOMY  as child   TRANSANAL HEMORRHOIDAL DEARTERIALIZATION N/A 10/27/2015   Procedure: TRANSANAL HEMORRHOIDAL DEARTERIALIZATION;  Surgeon: Michael Boston, MD;  Location: [place];  Service: General;  Laterality: N/A;   UPPER GASTROINTESTINAL ENDOSCOPY     Social History   Socioeconomic History   Marital status: Married    Spouse name: Not on file   Number of children: Not on file   Years of education: Not on file   Highest education level: Not on file  Occupational History   Not on file  Tobacco Use   Smoking status: Former    Packs/day: 1.00    Years: 37.00    Total pack years: 37.00    Types: Cigarettes    Start date: 04/02/1973    Quit date: 12/19/2014    Years since quitting: 6.8   Smokeless tobacco: Never  Vaping Use   Vaping Use: Never used  Substance and Sexual Activity   Alcohol use: Never    Alcohol/week: 0.0 standard drinks of alcohol   Drug use: Not Currently    Types: "Crack" cocaine    Comment: recovery addict since 2007   Sexual activity: Not on file  Other Topics Concern   Not on file  Social History Narrative   Lives with husband in a one story home.  Has 3 children.  Does not work.    Education: some college.   Social Determinants of Health   Financial Resource Strain: Not on file  Food Insecurity: Not on file  Transportation Needs: Not on file  Physical Activity: Not on file  Stress: Not on file  Social Connections: Not on file   Family History  Problem Relation Age of Onset   COPD Father    Alcohol abuse Father        Deceased   Hypertension Mother        Living   Healthy Daughter    Healthy Son    Anesthesia problems Neg Hx    Breast cancer Neg Hx    Colon cancer Neg Hx    Esophageal cancer Neg Hx    Rectal cancer Neg Hx    Stomach cancer Neg Hx    Colon polyps Neg Hx    Allergies  Allergen Reactions   Morphine Swelling    REACTION: face swells   Codeine Other (See Comments)    Avoids due to being recovery  addict   Prior to Admission medications   Medication Sig Start Date End Date Taking? Authorizing Provider  atorvastatin (LIPITOR) 40 MG tablet Take 1 tablet (40 mg total) by mouth daily. 08/08/20  Yes Asencion Noble, MD  lisinopril-hydrochlorothiazide (ZESTORETIC) 20-25 MG tablet Take 1 tablet by mouth daily. 08/17/21  Yes Wayland Denis, MD     Positive ROS: All other systems have been reviewed and were otherwise negative with the exception of those mentioned in the HPI and as above.  Physical Exam: General: Alert, no acute distress Cardiovascular: No pedal edema Respiratory: No cyanosis, no use of accessory musculature GI: abdomen soft Skin: No lesions in the area of chief complaint Neurologic: Sensation intact distally Psychiatric: Patient is competent for consent with normal mood and affect Lymphatic: no lymphedema  MUSCULOSKELETAL: exam stable  Assessment: right carpal tunnel syndrome, left carpal tunnel syndrome  Plan: Plan for Procedure(s): RIGHT CARPAL TUNNEL RELEASE LEFT CARPAL TUNNEL INJECTION  The risks benefits and alternatives were discussed with the patient including but not limited to the risks of nonoperative treatment, versus surgical intervention including infection, bleeding, nerve injury,  blood clots, cardiopulmonary complications, morbidity, mortality, among others, and they were willing to proceed.   Eduard Roux, MD 10/18/2021 10:36 AM

## 2021-10-19 ENCOUNTER — Encounter (HOSPITAL_BASED_OUTPATIENT_CLINIC_OR_DEPARTMENT_OTHER): Payer: Self-pay | Admitting: Orthopaedic Surgery

## 2021-10-27 ENCOUNTER — Ambulatory Visit (INDEPENDENT_AMBULATORY_CARE_PROVIDER_SITE_OTHER): Payer: BC Managed Care – PPO | Admitting: Physician Assistant

## 2021-10-27 DIAGNOSIS — G5601 Carpal tunnel syndrome, right upper limb: Secondary | ICD-10-CM | POA: Diagnosis not present

## 2021-10-27 DIAGNOSIS — Z9889 Other specified postprocedural states: Secondary | ICD-10-CM

## 2021-10-27 NOTE — Progress Notes (Signed)
Post-Op Visit Note   Patient: Stacy Thomas           Date of Birth: [birth year]           MRN: [id number] Visit Date: 10/27/2021 PCP: Drucie Opitz, MD   Assessment & Plan:  Chief Complaint:  Chief Complaint  Patient presents with   Right Hand - Follow-up    Right carpal tunnel release 10/18/2021   Visit Diagnoses:  1. Right carpal tunnel syndrome   2. S/P carpal tunnel release     Plan: Patient is a pleasant 61 year old female comes in today 1 week status post right carpal tunnel release 10/18/2021.  She has been doing well.  No complaints of pain or paresthesias.  She does note that she removed her bandage this morning and took a bath.  Examination of the right hand reveals a well-healing surgical incision with nylon sutures in place.  No evidence of infection or cellulitis.  Fingers warm well perfused.  She is neurovascular intact distally.  Today her wound was cleaned and recovered.  We have provided her with a Velcro splint to wear at all times for the next week.  No heavy lifting or submerging her hand underwater for another 3 weeks.  Follow-up next week for suture removal.  Call with concerns or questions in meantime.  Follow-Up Instructions: Return in about 1 week (around 11/03/2021).   Orders:  No orders of the defined types were placed in this encounter.  No orders of the defined types were placed in this encounter.   Imaging: No results found.  PMFS History: Patient Active Problem List   Diagnosis Date Noted   Right carpal tunnel syndrome 10/04/2021   Left carpal tunnel syndrome 10/04/2021   Strain of neck muscle 02/23/2021   Bilateral hand numbness 02/23/2021   Prediabetes 02/23/2021   Hair loss 02/23/2021   Memory changes 03/01/2020   CKD (chronic kidney disease) 09/11/2019   HLD (hyperlipidemia) 09/09/2018   Allergic sinusitis 12/03/2017   Osteoarthritis of right hip 12/03/2017   Hx of AKA (above knee amputation), left ([place]) 02/18/2017   Unilateral  primary osteoarthritis, right knee 02/18/2017   Vitamin D deficiency 05/04/2016   Bilateral primary osteoarthritis of hip 04/26/2016   Bilateral carpal tunnel syndrome 03/29/2016   Screening for cervical cancer 03/06/2016   PVD (peripheral vascular disease) ([place]) 03/05/2016   Pancreatic abnormality 03/05/2016   HTN (hypertension) 03/05/2016   Hepatic cirrhosis ([place]) 11/15/2015   Chronic hepatitis C ([place]) 06/15/2015   Prolapsed internal hemorrhoids, grade 4 06/14/2015   Hx of osteosarcoma 06/14/2015   Past Medical History:  Diagnosis Date   Bilateral arm pain    chronic, due to use of crutches   Dependence on crutches    left leg prosthesis   Drug addiction in remission ([place])    since 2007  (crack cocaine,  IV drug use)   Hand weakness    Hepatic cirrhosis ([place]) 2018   followed by dr Havery Moros (gi) due to hx chronic hepatitis c   Hiatal hernia    History of esophageal stricture    post dilation x2   History of hepatitis C    pt completed harvoni treatment 2017, cured;  genotype 1a with fibrosis 3/4,  previously seen by dr comer   History of osteosarcoma    1982  left knee---  s/p  AKA LLE--   per pt no recurrence   History of recreational drug use multiple rehab visits   IV drug use for  1 yr and Smoked crack for 30+yrs--  per pt clean since 2007   Hyperlipidemia    Hypertension    Idiopathic peripheral neuropathy    OSA (obstructive sleep apnea)    study in epic 10-27-2018 by dr Rexene Alberts,  moderate osa,  pt stated used cpap until approx. 07/ 2021, intolerate of mask   Osteoarthritis    Pancreatic cyst    followed by dr Havery Moros--- per office note bening and stable   Sigmoid diverticulosis    mild   Wears glasses     Family History  Problem Relation Age of Onset   COPD Father    Alcohol abuse Father        Deceased   Hypertension Mother        Living   Healthy Daughter    Healthy Son    Anesthesia problems Neg Hx    Breast cancer Neg Hx    Colon cancer Neg Hx     Esophageal cancer Neg Hx    Rectal cancer Neg Hx    Stomach cancer Neg Hx    Colon polyps Neg Hx     Past Surgical History:  Procedure Laterality Date   ABOVE KNEE LEG AMPUTATION Left 1982   osteosarcoma   CARPAL TUNNEL RELEASE Right 10/18/2021   Procedure: RIGHT CARPAL TUNNEL RELEASE;  Surgeon: Leandrew Koyanagi, MD;  Location: [place];  Service: Orthopedics;  Laterality: Right;   COLONOSCOPY WITH PROPOFOL  last one 04-28-2020  dr Havery Moros   ESOPHAGOGASTRODUODENOSCOPY (EGD) WITH PROPOFOL  last one 11-02-2019  dr Havery Moros   EXCISION BREAST BX  2015   CYST   EXCISION OF SKIN TAG N/A 07/14/2020   Procedure: EXCISION OF ANAL PAPILLA;  Surgeon: Leighton Ruff, MD;  Location: [facility];  Service: General;  Laterality: N/A;   STERIOD INJECTION Left 10/18/2021   Procedure: LEFT CARPAL TUNNEL INJECTION;  Surgeon: Leandrew Koyanagi, MD;  Location: [place];  Service: Orthopedics;  Laterality: Left;   TONSILLECTOMY  as child   TRANSANAL HEMORRHOIDAL DEARTERIALIZATION N/A 10/27/2015   Procedure: TRANSANAL HEMORRHOIDAL DEARTERIALIZATION;  Surgeon: Michael Boston, MD;  Location: [place];  Service: General;  Laterality: N/A;   UPPER GASTROINTESTINAL ENDOSCOPY     Social History   Occupational History   Not on file  Tobacco Use   Smoking status: Former    Packs/day: 1.00    Years: 37.00    Total pack years: 37.00    Types: Cigarettes    Start date: 04/02/1973    Quit date: 12/19/2014    Years since quitting: 6.8   Smokeless tobacco: Never  Vaping Use   Vaping Use: Never used  Substance and Sexual Activity   Alcohol use: Never    Alcohol/week: 0.0 standard drinks of alcohol   Drug use: Not Currently    Types: "Crack" cocaine    Comment: recovery addict since 2007   Sexual activity: Not on file

## 2021-11-02 ENCOUNTER — Telehealth: Payer: Self-pay

## 2021-11-02 MED ORDER — ATORVASTATIN CALCIUM 40 MG PO TABS: 40.0000 mg | ORAL_TABLET | Freq: Every day | ORAL | 3 refills | Status: DC

## 2021-11-02 NOTE — Telephone Encounter (Signed)
atorvastatin (LIPITOR) 40 MG tablet, refill request @ [facility] ([place] [place] - [address]  Pt states she is completely out of medication, requesting med to be filled by today.

## 2021-11-03 ENCOUNTER — Encounter: Payer: Self-pay | Admitting: Orthopaedic Surgery

## 2021-11-03 ENCOUNTER — Ambulatory Visit (INDEPENDENT_AMBULATORY_CARE_PROVIDER_SITE_OTHER): Payer: BC Managed Care – PPO | Admitting: Orthopaedic Surgery

## 2021-11-03 DIAGNOSIS — Z9889 Other specified postprocedural states: Secondary | ICD-10-CM

## 2021-11-03 NOTE — Progress Notes (Signed)
Post-Op Visit Note   Patient: Stacy Thomas           Date of Birth: 20-Jul-1960           MRN: [id number] Visit Date: 11/03/2021 PCP: Drucie Opitz, MD   Assessment & Plan:  Chief Complaint:  Chief Complaint  Patient presents with   Right Wrist - Follow-up    Carpal tunnel release 10/18/2021   Visit Diagnoses:  1. S/P carpal tunnel release     Plan: Jayleah is 2 weeks s/p right carpal tunnel release.  Doing well overall.  No complaints other than concerns about the incision.  Examination shows a healed surgical incision.  No evidence of infection.  Reassurance was provided that the incision looks good.  We would expect some of the [place] her skin to slough off.  She will put Neosporin in the area twice a day with a Band-Aid until healed.  We will see her back in 4 weeks.  Follow-Up Instructions: Return in about 4 weeks (around 12/01/2021).   Orders:  No orders of the defined types were placed in this encounter.  No orders of the defined types were placed in this encounter.   Imaging: No results found.  PMFS History: Patient Active Problem List   Diagnosis Date Noted   Right carpal tunnel syndrome 10/04/2021   Left carpal tunnel syndrome 10/04/2021   Strain of neck muscle 02/23/2021   Bilateral hand numbness 02/23/2021   Prediabetes 02/23/2021   Hair loss 02/23/2021   Memory changes 03/01/2020   CKD (chronic kidney disease) 09/11/2019   HLD (hyperlipidemia) 09/09/2018   Allergic sinusitis 12/03/2017   Osteoarthritis of right hip 12/03/2017   Hx of AKA (above knee amputation), left ([place]) 02/18/2017   Unilateral primary osteoarthritis, right knee 02/18/2017   Vitamin D deficiency 05/04/2016   Bilateral primary osteoarthritis of hip 04/26/2016   Bilateral carpal tunnel syndrome 03/29/2016   Screening for cervical cancer 03/06/2016   PVD (peripheral vascular disease) ([place]) 03/05/2016   Pancreatic abnormality 03/05/2016   HTN (hypertension) 03/05/2016   Hepatic  cirrhosis ([place]) 11/15/2015   Chronic hepatitis C ([place]) 06/15/2015   Prolapsed internal hemorrhoids, grade 4 06/14/2015   Hx of osteosarcoma 06/14/2015   Past Medical History:  Diagnosis Date   Bilateral arm pain    chronic, due to use of crutches   Dependence on crutches    left leg prosthesis   Drug addiction in remission ([place])    since 2007  (crack cocaine,  IV drug use)   Hand weakness    Hepatic cirrhosis ([place]) 2018   followed by dr Havery Moros (gi) due to hx chronic hepatitis c   Hiatal hernia    History of esophageal stricture    post dilation x2   History of hepatitis C    pt completed harvoni treatment 2017, cured;  genotype 1a with fibrosis 3/4,  previously seen by dr comer   History of osteosarcoma    1982  left knee---  s/p  AKA LLE--   per pt no recurrence   History of recreational drug use multiple rehab visits   IV drug use for 1 yr and Smoked crack for 30+yrs--  per pt clean since 2007   Hyperlipidemia    Hypertension    Idiopathic peripheral neuropathy    OSA (obstructive sleep apnea)    study in epic 10-27-2018 by dr Rexene Alberts,  moderate osa,  pt stated used cpap until approx. 07/ 2021, intolerate of mask   Osteoarthritis  Pancreatic cyst    followed by dr Havery Moros--- per office note bening and stable   Sigmoid diverticulosis    mild   Wears glasses     Family History  Problem Relation Age of Onset   COPD Father    Alcohol abuse Father        Deceased   Hypertension Mother        Living   Healthy Daughter    Healthy Son    Anesthesia problems Neg Hx    Breast cancer Neg Hx    Colon cancer Neg Hx    Esophageal cancer Neg Hx    Rectal cancer Neg Hx    Stomach cancer Neg Hx    Colon polyps Neg Hx     Past Surgical History:  Procedure Laterality Date   ABOVE KNEE LEG AMPUTATION Left 1982   osteosarcoma   CARPAL TUNNEL RELEASE Right 10/18/2021   Procedure: RIGHT CARPAL TUNNEL RELEASE;  Surgeon: Leandrew Koyanagi, MD;  Location: [facility];  Service: Orthopedics;  Laterality: Right;   COLONOSCOPY WITH PROPOFOL  last one 04-28-2020  dr Havery Moros   ESOPHAGOGASTRODUODENOSCOPY (EGD) WITH PROPOFOL  last one 11-02-2019  dr Havery Moros   EXCISION BREAST BX  2015   CYST   EXCISION OF SKIN TAG N/A 07/14/2020   Procedure: EXCISION OF ANAL PAPILLA;  Surgeon: Leighton Ruff, MD;  Location: [facility];  Service: General;  Laterality: N/A;   STERIOD INJECTION Left 10/18/2021   Procedure: LEFT CARPAL TUNNEL INJECTION;  Surgeon: Leandrew Koyanagi, MD;  Location: [facility];  Service: Orthopedics;  Laterality: Left;   TONSILLECTOMY  as child   TRANSANAL HEMORRHOIDAL DEARTERIALIZATION N/A 10/27/2015   Procedure: TRANSANAL HEMORRHOIDAL DEARTERIALIZATION;  Surgeon: Michael Boston, MD;  Location: [facility];  Service: General;  Laterality: N/A;   UPPER GASTROINTESTINAL ENDOSCOPY     Social History   Occupational History   Not on file  Tobacco Use   Smoking status: Former    Packs/day: 1.00    Years: 37.00    Total pack years: 37.00    Types: Cigarettes    Start date: 04/02/1973    Quit date: 12/19/2014    Years since quitting: 6.8   Smokeless tobacco: Never  Vaping Use   Vaping Use: Never used  Substance and Sexual Activity   Alcohol use: Never    Alcohol/week: 0.0 standard drinks of alcohol   Drug use: Not Currently    Types: "Crack" cocaine    Comment: recovery addict since 2007   Sexual activity: Not on file

## 2021-11-06 NOTE — Telephone Encounter (Signed)
Patient sent message via my chart to contact our office to get an appointment scheduled.

## 2021-11-07 ENCOUNTER — Telehealth: Payer: Self-pay

## 2021-11-07 DIAGNOSIS — K7460 Unspecified cirrhosis of liver: Secondary | ICD-10-CM

## 2021-11-07 NOTE — Telephone Encounter (Signed)
Orders entered. MyChart message sent to patient

## 2021-11-07 NOTE — Telephone Encounter (Signed)
-----   Message from Roetta Sessions, [place] sent at 05/24/2021 12:56 PM EST ----- Regarding: labs due August Recall labs due in mid August  CBC, LFTs, INR, AFP (dx Cirrhosis)

## 2021-11-10 ENCOUNTER — Other Ambulatory Visit (INDEPENDENT_AMBULATORY_CARE_PROVIDER_SITE_OTHER): Payer: BC Managed Care – PPO

## 2021-11-10 DIAGNOSIS — K7460 Unspecified cirrhosis of liver: Secondary | ICD-10-CM | POA: Diagnosis not present

## 2021-11-10 LAB — HEPATIC FUNCTION PANEL
ALT: 13 U/L (ref 0–35)
AST: 23 U/L (ref 0–37)
Albumin: 4.5 g/dL (ref 3.5–5.2)
Alkaline Phosphatase: 106 U/L (ref 39–117)
Bilirubin, Direct: 0.2 mg/dL (ref 0.0–0.3)
Total Bilirubin: 0.8 mg/dL (ref 0.2–1.2)
Total Protein: 7.6 g/dL (ref 6.0–8.3)

## 2021-11-10 LAB — CBC WITH DIFFERENTIAL/PLATELET
Basophils Absolute: 0 10*3/uL (ref 0.0–0.1)
Basophils Relative: 0.5 % (ref 0.0–3.0)
Eosinophils Absolute: 0.1 10*3/uL (ref 0.0–0.7)
Eosinophils Relative: 2.2 % (ref 0.0–5.0)
HCT: 38.6 % (ref 36.0–46.0)
Hemoglobin: 13.2 g/dL (ref 12.0–15.0)
Lymphocytes Relative: 54.3 % — ABNORMAL HIGH (ref 12.0–46.0)
Lymphs Abs: 2.2 10*3/uL (ref 0.7–4.0)
MCHC: 34.3 g/dL (ref 30.0–36.0)
MCV: 87.2 fl (ref 78.0–100.0)
Monocytes Absolute: 0.4 10*3/uL (ref 0.1–1.0)
Monocytes Relative: 10.1 % (ref 3.0–12.0)
Neutro Abs: 1.4 10*3/uL (ref 1.4–7.7)
Neutrophils Relative %: 32.9 % — ABNORMAL LOW (ref 43.0–77.0)
Platelets: 225 10*3/uL (ref 150.0–400.0)
RBC: 4.43 Mil/uL (ref 3.87–5.11)
RDW: 13.7 % (ref 11.5–15.5)
WBC: 4.1 10*3/uL (ref 4.0–10.5)

## 2021-11-10 LAB — PROTIME-INR
INR: 1.1 ratio — ABNORMAL HIGH (ref 0.8–1.0)
Prothrombin Time: 11.9 s (ref 9.6–13.1)

## 2021-11-13 ENCOUNTER — Telehealth: Payer: Self-pay

## 2021-11-13 ENCOUNTER — Other Ambulatory Visit: Payer: Self-pay

## 2021-11-13 DIAGNOSIS — K7460 Unspecified cirrhosis of liver: Secondary | ICD-10-CM

## 2021-11-13 LAB — AFP TUMOR MARKER: AFP-Tumor Marker: 5.4 ng/mL

## 2021-11-13 NOTE — Telephone Encounter (Signed)
Pt called into the office for lab results. We reviewed lab results and recommendations. Pt is aware that we will repeat labs in 6 months. Pt verbalized understanding and had no concerns at the end of the call.

## 2021-12-05 ENCOUNTER — Encounter: Payer: Self-pay | Admitting: Student

## 2021-12-05 ENCOUNTER — Ambulatory Visit (INDEPENDENT_AMBULATORY_CARE_PROVIDER_SITE_OTHER): Payer: BC Managed Care – PPO | Admitting: Student

## 2021-12-05 VITALS — BP 92/63 | HR 76 | Temp 98.0°F | Ht 60.0 in | Wt 148.5 lb

## 2021-12-05 DIAGNOSIS — I10 Essential (primary) hypertension: Secondary | ICD-10-CM

## 2021-12-05 DIAGNOSIS — Z89612 Acquired absence of left leg above knee: Secondary | ICD-10-CM

## 2021-12-05 DIAGNOSIS — R7303 Prediabetes: Secondary | ICD-10-CM

## 2021-12-05 DIAGNOSIS — K7460 Unspecified cirrhosis of liver: Secondary | ICD-10-CM

## 2021-12-05 DIAGNOSIS — R5383 Other fatigue: Secondary | ICD-10-CM

## 2021-12-05 DIAGNOSIS — R5381 Other malaise: Secondary | ICD-10-CM

## 2021-12-05 DIAGNOSIS — Z87891 Personal history of nicotine dependence: Secondary | ICD-10-CM

## 2021-12-05 DIAGNOSIS — Q453 Other congenital malformations of pancreas and pancreatic duct: Secondary | ICD-10-CM

## 2021-12-05 HISTORY — DX: Other malaise: R53.81

## 2021-12-05 LAB — PROTIME-INR
INR: 1.1 (ref 0.8–1.2)
Prothrombin Time: 14 seconds (ref 11.4–15.2)

## 2021-12-05 MED ORDER — LISINOPRIL 20 MG PO TABS: 20.0000 mg | ORAL_TABLET | Freq: Every day | ORAL | 0 refills | Status: DC

## 2021-12-05 NOTE — Assessment & Plan Note (Addendum)
Ms. Stacy Thomas is presenting to clinic today to discuss recent malaise, fatigue over the last 30mo25yr. She reports she does not feel well overall and has had minimal appetite. Mentions significant weight loss of ~15lb over the last year.  Mentions that her prosthetic has felt loose, although has not seen any skin breakdown. She does have a history of obstructive sleep apnea and uses her mask nightly, however her sleep has been poor overall. Denies recent life-changing events. She lives at home with her husband, feels safe at home. She reports that she does not feel depressed and generally has a positive attitude. Relies on her faith often to get through the hard times. Denies dyspnea, cough, abdominal pain, nausea, vomiting, constipation, diarrhea, dysuria, increased urinary frequency.  Patient's symptoms overall are non-specific. Exam overall benign - no lymphadenopathy, clear lung sounds, non-tender abdomen, warm skin without rashes or lesions. She does have a history of cirrhosis, although she has not shown any signs of synthetic dysfunction, so unlikely that compensated cirrhosis would be causing her symptoms. She is up to date on age-appropriate cancer screening, planning for upcoming colonoscopy. We will initiate work-up with basic labs, thyroid studies, A1c.   - Follow-up CBC, CMP, TSH, A1c

## 2021-12-05 NOTE — Addendum Note (Signed)
Addended by: Truddie Crumble on: 12/05/2021 09:36 AM   Modules accepted: Orders

## 2021-12-05 NOTE — Assessment & Plan Note (Addendum)
Patient is followed by Dr. Enis Gash for this issue. She has a history of cirrhosis due to chronic hepatitis C infection, which has now cleared. Had MRI completed with GI recently, showed stable pancreatic lesion and stable liver lesion. At that time, recommended to repeat ultrasound in 6 months, MRI in two years. Lab work at that appointment showed normal synthetic liver function, normal platelets, albumin, coags.   Patient has been generally fatigued since, but otherwise no new symptoms. Denies abdominal pain, nausea, vomiting, hematemesis, melena.  On exam, no signs of decompensated cirrhosis or stigmata of cirrhosis appreciated.  We will obtain repeat lab work today, as GI wanted six month lab work follow-up. She is to return to GI in 6 months.

## 2021-12-05 NOTE — Assessment & Plan Note (Addendum)
BP Readings from Last 3 Encounters:  12/05/21 92/63  10/18/21 129/83  05/23/21 94/66   Blood pressure lower today with systolic in 13'K. She reports compliance with her medications. She is not symptomatic, denying chest pain, dyspnea, lightheadedness, syncope. During our conversation, she did mention some chronic tinnitus. Possible thiazide diuretic contributing? Given her BP is soft today, even on repeat pressure, we will hold off HCTZ. Patient has lost recent weight, BP could be reflective of this.   - STOP combination pill - Start lisinopril '20mg'$  daily - CMET today

## 2021-12-05 NOTE — Assessment & Plan Note (Signed)
Patient reports difficulty getting out of chair, requesting order for lift chair. Patient does have a history of osteosarcoma with L AKA.  - DME order placed.

## 2021-12-05 NOTE — Progress Notes (Signed)
CC: fatigue  HPI:  Stacy Thomas is a 61 y.o. person with cirrhosis 2/2 chronic hepatitis C, osteoarthritis, history osteosarcoma s/p L AKA, prediabetes presenting to [facility] for fatigue.  Please see problem-based list for further details, assessments, and plans.  Past Medical History:  Diagnosis Date   Bilateral arm pain    chronic, due to use of crutches   Dependence on crutches    left leg prosthesis   Drug addiction in remission ([place])    since 2007  (crack cocaine,  IV drug use)   Hand weakness    Hepatic cirrhosis ([place]) 2018   followed by dr Havery Moros (gi) due to hx chronic hepatitis c   Hiatal hernia    History of esophageal stricture    post dilation x2   History of hepatitis C    pt completed harvoni treatment 2017, cured;  genotype 1a with fibrosis 3/4,  previously seen by dr comer   History of osteosarcoma    1982  left knee---  s/p  AKA LLE--   per pt no recurrence   History of recreational drug use multiple rehab visits   IV drug use for 1 yr and Smoked crack for 30+yrs--  per pt clean since 2007   Hyperlipidemia    Hypertension    Idiopathic peripheral neuropathy    OSA (obstructive sleep apnea)    study in epic 10-27-2018 by dr Rexene Alberts,  moderate osa,  pt stated used cpap until approx. 07/ 2021, intolerate of mask   Osteoarthritis    Pancreatic cyst    followed by dr Havery Moros--- per office note bening and stable   Sigmoid diverticulosis    mild   Wears glasses    Review of Systems:  As per HPI  Physical Exam:  Vitals:   12/05/21 0851 12/05/21 0856  BP: 90/62 92/63  Pulse: 82 76  Temp: 98 F (36.7 C)   TempSrc: Oral   SpO2: 99%   Weight: 148 lb 8 oz (67.4 kg)   Height: 5' (1.524 m)    General: Well-appearing, resting comfortably in no acute distress HENT: Normocephalic, atraumatic. Neck supple. No thyroid nodules appreciated. No cervical, supraclavicular lymphadenopathy. Tympanic membranes clear and in tact bilaterally, no cerumen impaction  or trauma appreciated. Eyes: Vision grossly in tact. No scleral icterus bilaterally. CV: Regular rate, rhythm. No murmurs appreciated. Warm extremities.  Pulm: Normal work of breathing on room air. Clear to ausculation bilaterally. GI: Abdomen mildly distended, non-tender. No fluid wave.  MSK: S/P L AKA. Normal bulk, tone. No peripheral edema appreciated.  Skin: Warm, dry. No spider angiomas, telangiectasias, or palmar erythema.  Neuro: Awake, alert, conversing appropriately. Grossly non-focal. No asterixis. Psych: Normal mood, affect, speech.   Assessment & Plan:   Hypertension BP Readings from Last 3 Encounters:  12/05/21 92/63  10/18/21 129/83  05/23/21 94/66   Blood pressure lower today with systolic in 62'H. She reports compliance with her medications. She is not symptomatic, denying chest pain, dyspnea, lightheadedness, syncope. During our conversation, she did mention some chronic tinnitus. Possible thiazide diuretic contributing? Given her BP is soft today, even on repeat pressure, we will hold off HCTZ. Patient has lost recent weight, BP could be reflective of this.   - STOP combination pill - Start lisinopril '20mg'$  daily - CMET today  Hepatic cirrhosis ([place]) Patient is followed by Dr. Enis Gash for this issue. She has a history of cirrhosis due to chronic hepatitis C infection, which has now cleared. Had MRI completed with  GI recently, showed stable pancreatic lesion and stable liver lesion. At that time, recommended to repeat ultrasound in 6 months, MRI in two years. Lab work at that appointment showed normal synthetic liver function, normal platelets, albumin, coags.   Patient has been generally fatigued since, but otherwise no new symptoms. Denies abdominal pain, nausea, vomiting, hematemesis, melena.  On exam, no signs of decompensated cirrhosis or stigmata of cirrhosis appreciated.  We will obtain repeat lab work today, as GI wanted six month lab work follow-up. She is to  return to GI in 6 months.  Pancreatic abnormality MRI 07/2021 revealed stable pancreatic lesion for >30yr Plan to r/p MRI in 2 years.  Malaise and fatigue [name] presenting to clinic today to discuss recent malaise, fatigue over the last [id number]. She reports she does not feel well overall and has had minimal appetite. Mentions significant weight loss of ~15lb over the last year.  Mentions that her prosthetic has felt loose, although has not seen any skin breakdown. She does have a history of obstructive sleep apnea and uses her mask nightly, however her sleep has been poor overall. Denies recent life-changing events. She lives at home with her husband, feels safe at home. She reports that she does not feel depressed and generally has a positive attitude. Relies on her faith often to get through the hard times. Denies dyspnea, cough, abdominal pain, nausea, vomiting, constipation, diarrhea, dysuria, increased urinary frequency.  Patient's symptoms overall are non-specific. Exam overall benign - no lymphadenopathy, clear lung sounds, non-tender abdomen, warm skin without rashes or lesions. She does have a history of cirrhosis, although she has not shown any signs of synthetic dysfunction, so unlikely that compensated cirrhosis would be causing her symptoms. She is up to date on age-appropriate cancer screening, planning for upcoming colonoscopy. We will initiate work-up with basic labs, thyroid studies, A1c.   - Follow-up CBC, CMP, TSH, A1c  [facility] (above knee [facility]putation[facility] reports difficulty getting out of chair, requesting order for lift chair. Patient does have a history of osteosarcoma with L AKA.  - DME order placed.    Patient discussed with Dr. ViJacquenette ShoneMD Internal Medicine PGY-3 Pager: [phone]

## 2021-12-05 NOTE — Addendum Note (Signed)
Addended by: Truddie Crumble on: 12/05/2021 09:35 AM   Modules accepted: Orders

## 2021-12-05 NOTE — Patient Instructions (Signed)
Stacy Thomas, it was a pleasure seeing you today!  Today we discussed: - Fatigue/poor appetite: I'd like to get some lab work today to evaluate. I will call you tomorrow with the results.  - Blood pressure: Please STOP the medication you are taking. I will switch you to lisinopril alone.   I have ordered the following labs today:  CMP CBC PT-INR AFP tumor marker TSH  I have ordered the following medication/changed the following medications:   Stop the following medications: Medications Discontinued During This Encounter  Medication Reason   lisinopril-hydrochlorothiazide (ZESTORETIC) 20-25 MG tablet      Start the following medications: Meds ordered this encounter  Medications   lisinopril (ZESTRIL) 20 MG tablet    Sig: Take 1 tablet (20 mg total) by mouth daily.    Dispense:  90 tablet    Refill:  0     Follow-up:  1 month    Please make sure to arrive 15 minutes prior to your next appointment. If you arrive late, you may be asked to reschedule.   We look forward to seeing you next time. Please call our clinic at [phone] if you have any questions or concerns. The best time to call is Monday-Friday from 9am-4pm, but there is someone available 24/7. If after hours or the weekend, call the main hospital number and ask for the Internal Medicine Resident On-Call. If you need medication refills, please notify your pharmacy one week in advance and they will send [place] a request.  Thank you for letting us take part in your care. Wishing you the best!  Thank you, Linward Natal, MD

## 2021-12-05 NOTE — Assessment & Plan Note (Signed)
MRI 07/2021 revealed stable pancreatic lesion for >75yr Plan to r/p MRI in 2 years.

## 2021-12-06 LAB — COMPREHENSIVE METABOLIC PANEL
ALT: 16 IU/L (ref 0–32)
AST: 25 IU/L (ref 0–40)
Albumin/Globulin Ratio: 1.9 (ref 1.2–2.2)
Albumin: 4.6 g/dL (ref 3.9–4.9)
Alkaline Phosphatase: 121 IU/L (ref 44–121)
BUN/Creatinine Ratio: 11 — ABNORMAL LOW (ref 12–28)
BUN: 11 mg/dL (ref 8–27)
Bilirubin Total: 0.8 mg/dL (ref 0.0–1.2)
CO2: 23 mmol/L (ref 20–29)
Calcium: 10 mg/dL (ref 8.7–10.3)
Chloride: 104 mmol/L (ref 96–106)
Creatinine, Ser: 1.03 mg/dL — ABNORMAL HIGH (ref 0.57–1.00)
Globulin, Total: 2.4 g/dL (ref 1.5–4.5)
Glucose: 82 mg/dL (ref 70–99)
Potassium: 3.8 mmol/L (ref 3.5–5.2)
Sodium: 140 mmol/L (ref 134–144)
Total Protein: 7 g/dL (ref 6.0–8.5)
eGFR: 61 mL/min/{1.73_m2} (ref >59)

## 2021-12-06 LAB — HEMOGLOBIN A1C
Est. average glucose Bld gHb Est-mCnc: 120 mg/dL
Hgb A1c MFr Bld: 5.8 % — ABNORMAL HIGH (ref 4.8–5.6)

## 2021-12-06 LAB — TSH: TSH: 1.7 u[IU]/mL (ref 0.450–4.500)

## 2021-12-06 LAB — CBC WITH DIFFERENTIAL/PLATELET
Basophils Absolute: 0 10*3/uL (ref 0.0–0.2)
Basos: 1 %
EOS (ABSOLUTE): 0.1 10*3/uL (ref 0.0–0.4)
Eos: 2 %
Hematocrit: 39.8 % (ref 34.0–46.6)
Hemoglobin: 13.5 g/dL (ref 11.1–15.9)
Immature Grans (Abs): 0 10*3/uL (ref 0.0–0.1)
Immature Granulocytes: 0 %
Lymphocytes Absolute: 1.9 10*3/uL (ref 0.7–3.1)
Lymphs: 45 %
MCH: 29.8 pg (ref 26.6–33.0)
MCHC: 33.9 g/dL (ref 31.5–35.7)
MCV: 88 fL (ref 79–97)
Monocytes Absolute: 0.4 10*3/uL (ref 0.1–0.9)
Monocytes: 9 %
Neutrophils Absolute: 1.8 10*3/uL (ref 1.4–7.0)
Neutrophils: 43 %
Platelets: 210 10*3/uL (ref 150–450)
RBC: 4.53 x10E6/uL (ref 3.77–5.28)
RDW: 13.1 % (ref 11.7–15.4)
WBC: 4.2 10*3/uL (ref 3.4–10.8)

## 2021-12-06 LAB — AFP TUMOR MARKER: AFP, Serum, Tumor Marker: 4.7 ng/mL (ref 0.0–9.2)

## 2021-12-06 NOTE — Progress Notes (Signed)
Internal Medicine Clinic Attending ? ?Case discussed with Dr. Braswell  At the time of the visit.  We reviewed the resident?s history and exam and pertinent patient test results.  I agree with the assessment, diagnosis, and plan of care documented in the resident?s note.  ?

## 2021-12-11 ENCOUNTER — Telehealth: Payer: Self-pay | Admitting: *Deleted

## 2021-12-11 ENCOUNTER — Ambulatory Visit (INDEPENDENT_AMBULATORY_CARE_PROVIDER_SITE_OTHER): Payer: BC Managed Care – PPO

## 2021-12-11 ENCOUNTER — Ambulatory Visit (INDEPENDENT_AMBULATORY_CARE_PROVIDER_SITE_OTHER): Payer: BC Managed Care – PPO | Admitting: Orthopedic Surgery

## 2021-12-11 ENCOUNTER — Encounter: Payer: Self-pay | Admitting: Orthopedic Surgery

## 2021-12-11 DIAGNOSIS — M1711 Unilateral primary osteoarthritis, right knee: Secondary | ICD-10-CM | POA: Diagnosis not present

## 2021-12-11 NOTE — Telephone Encounter (Signed)
Stacy Thomas, [name], [place]; Stephannie Peters; [place], [place]; [place], Chauncy Passy; 1 other  Got this!! Thanks :)    Previous Messages    ----- Message -----  From: Judge Stall, [place]  Sent: 12/07/2021   9:11 AM EDT  To: Darlina Guys; Miquel Dunn; Stephannie Peters; *  Subject: DME order for Lift Chair                       Good Morning I have a DME order in for this patient.Thank you for your help Silverio Decamp C9/7/20239:11 AM

## 2021-12-11 NOTE — Progress Notes (Signed)
Office Visit Note   Patient: Stacy Thomas           Date of Birth: 02-12-1961           MRN: [id number] Visit Date: 12/11/2021              Requested by: Sanjuan Dame, MD [address] [place],  [place] [address] PCP: Sanjuan Dame, MD  Chief Complaint  Patient presents with   Right Knee - Pain      HPI: Patient is a 61 year old woman who presents with increasing osteoarthritis pain in her right knee she is status post an injection status post bracing with pain and weakness all the time.  Patient is status post a left above-knee amputation for osteosarcoma.  Patient is concerned that there may be bone involvement in the right knee.  Assessment & Plan: Visit Diagnoses:  1. Unilateral primary osteoarthritis, right knee     Plan: We will set her up for an MRI scan to further evaluate the right knee.  Anticipate after the MRI scan we can set her up for total knee arthroplasty on the right.  Follow-Up Instructions: No follow-ups on file.   Ortho Exam  Patient is alert, oriented, no adenopathy, well-dressed, normal affect, normal respiratory effort. Examination of the right knee there is no redness no cellulitis no swelling she has pain with attempted range of motion.  She lacks full extension and full flexion.  Imaging: No results found. No images are attached to the encounter.  Labs: Lab Results  Component Value Date   HGBA1C 5.8 (H) 12/05/2021   HGBA1C 5.9 (H) 02/20/2021   HGBA1C 5.9 (H) 02/29/2020     Lab Results  Component Value Date   ALBUMIN 4.6 12/05/2021   ALBUMIN 4.5 11/10/2021   ALBUMIN 4.5 05/23/2021    No results found for: "MG" Lab Results  Component Value Date   VD25OH 15.8 (L) 05/03/2016    No results found for: "PREALBUMIN"    Latest Ref Rng & Units 12/05/2021    9:56 AM 11/10/2021    9:39 AM 05/23/2021   11:23 AM  CBC EXTENDED  WBC 3.4 - 10.8 x10E3/uL 4.2  4.1  4.8   RBC 3.77 - 5.28 x10E6/uL 4.53  4.43  4.58   Hemoglobin 11.1 -  15.9 g/dL 13.5  13.2  13.4   HCT 34.0 - 46.6 % 39.8  38.6  40.2   Platelets 150 - 450 x10E3/uL 210  225.0  263.0   NEUT# 1.4 - 7.0 x10E3/uL 1.8  1.4  2.0   Lymph# 0.7 - 3.1 x10E3/uL 1.9  2.2  2.2      There is no height or weight on file to calculate BMI.  Orders:  Orders Placed This Encounter  Procedures   XR Knee 1-2 Views Right   No orders of the defined types were placed in this encounter.    Procedures: No procedures performed  Clinical Data: No additional findings.  ROS:  All other systems negative, except as noted in the HPI. Review of Systems  Objective: Vital Signs: LMP 12/02/2010   Specialty Comments:  No specialty comments available.  PMFS History: Patient Active Problem List   Diagnosis Date Noted   Malaise and fatigue 12/05/2021   Prediabetes 02/23/2021   Hair loss 02/23/2021   Memory changes 03/01/2020   CKD (chronic kidney disease) 09/11/2019   HLD (hyperlipidemia) 09/09/2018   Allergic sinusitis 12/03/2017   Hx of AKA (above knee amputation), left ([place]) 02/18/2017  Unilateral primary osteoarthritis, right knee 02/18/2017   Vitamin D deficiency 05/04/2016   Bilateral primary osteoarthritis of hip 04/26/2016   Bilateral carpal tunnel syndrome 03/29/2016   Screening for cervical cancer 03/06/2016   PVD (peripheral vascular disease) ([place]) 03/05/2016   Pancreatic abnormality 03/05/2016   Hypertension 03/05/2016   Hepatic cirrhosis ([place]) 11/15/2015   Chronic hepatitis C ([place]) 06/15/2015   Prolapsed internal hemorrhoids, grade 4 06/14/2015   Hx of osteosarcoma 06/14/2015   Past Medical History:  Diagnosis Date   Bilateral arm pain    chronic, due to use of crutches   Dependence on crutches    left leg prosthesis   Drug addiction in remission ([place])    since 2007  (crack cocaine,  IV drug use)   Hand weakness    Hepatic cirrhosis ([place]) 2018   followed by dr Havery Moros (gi) due to hx chronic hepatitis c   Hiatal hernia    History of  esophageal stricture    post dilation x2   History of hepatitis C    pt completed harvoni treatment 2017, cured;  genotype 1a with fibrosis 3/4,  previously seen by dr comer   History of osteosarcoma    1982  left knee---  s/p  AKA LLE--   per pt no recurrence   History of recreational drug use multiple rehab visits   IV drug use for 1 yr and Smoked crack for 30+yrs--  per pt clean since 2007   Hyperlipidemia    Hypertension    Idiopathic peripheral neuropathy    OSA (obstructive sleep apnea)    study in epic 10-27-2018 by dr Rexene Alberts,  moderate osa,  pt stated used cpap until approx. 07/ 2021, intolerate of mask   Osteoarthritis    Pancreatic cyst    followed by dr Havery Moros--- per office note bening and stable   Sigmoid diverticulosis    mild   Wears glasses     Family History  Problem Relation Age of Onset   COPD Father    Alcohol abuse Father        Deceased   Hypertension Mother        Living   Healthy Daughter    Healthy Son    Anesthesia problems Neg Hx    Breast cancer Neg Hx    Colon cancer Neg Hx    Esophageal cancer Neg Hx    Rectal cancer Neg Hx    Stomach cancer Neg Hx    Colon polyps Neg Hx     Past Surgical History:  Procedure Laterality Date   ABOVE KNEE LEG AMPUTATION Left 1982   osteosarcoma   CARPAL TUNNEL RELEASE Right 10/18/2021   Procedure: RIGHT CARPAL TUNNEL RELEASE;  Surgeon: Leandrew Koyanagi, MD;  Location: [facility];  Service: Orthopedics;  Laterality: Right;   COLONOSCOPY WITH PROPOFOL  last one 04-28-2020  dr Havery Moros   ESOPHAGOGASTRODUODENOSCOPY (EGD) WITH PROPOFOL  last one 11-02-2019  dr Havery Moros   EXCISION BREAST BX  2015   CYST   EXCISION OF SKIN TAG N/A 07/14/2020   Procedure: EXCISION OF ANAL PAPILLA;  Surgeon: Leighton Ruff, MD;  Location: [facility];  Service: General;  Laterality: N/A;   STERIOD INJECTION Left 10/18/2021   Procedure: LEFT CARPAL TUNNEL INJECTION;  Surgeon: Leandrew Koyanagi, MD;   Location: [facility];  Service: Orthopedics;  Laterality: Left;   TONSILLECTOMY  as child   TRANSANAL HEMORRHOIDAL DEARTERIALIZATION N/A 10/27/2015   Procedure: TRANSANAL HEMORRHOIDAL  DEARTERIALIZATION;  Surgeon: Michael Boston, MD;  [facility];  Service: General;  Laterality: N/A;   UPPER GASTROINTESTINAL ENDOSCOPY     Social History   Occupational History   Not on file  Tobacco Use   Smoking status: Former    Packs/day: 1.00    Years: 37.00    Total pack years: 37.00    Types: Cigarettes    Start date: 04/02/1973    Quit date: 12/19/2014    Years since quitting: 6.9   Smokeless tobacco: Never  Vaping Use   Vaping Use: Never used  Substance and Sexual Activity   Alcohol use: Never    Alcohol/week: 0.0 standard drinks of alcohol   Drug use: Not Currently    Types: "Crack" cocaine    Comment: recovery addict since 2007   Sexual activity: Not on file

## 2021-12-12 ENCOUNTER — Telehealth: Payer: Self-pay | Admitting: Student

## 2021-12-12 NOTE — Telephone Encounter (Addendum)
Pt requesting a call back.  Pt states she saw her Ortho Doctor (Dr. Sharol Given)  recently and has some questions about her health.  Pt is requesting to only speak with her PCP Dr. Collene Gobble about her concerns.

## 2021-12-12 NOTE — Telephone Encounter (Signed)
RTC to patient. Informed that Dr. Collene Gobble is out of the office.  Patient refused to speak with another doctor on his team will wait until Dr. Collene Gobble returns.   Message sent to Dr. Collene Gobble to call patient on his return.

## 2021-12-18 ENCOUNTER — Telehealth: Payer: Self-pay | Admitting: Student

## 2021-12-18 NOTE — Telephone Encounter (Signed)
Called Stacy Thomas back 9/18 1600. She inquired about results of lab work and update on DME. Confirmed DME order for chair lift has been completed.

## 2021-12-18 NOTE — Telephone Encounter (Signed)
Pt requesting a call back.  Pt some concerns about her lab work. Pt only wants to speak with her Dr and about some other things that have come up since her blood work.

## 2021-12-19 ENCOUNTER — Telehealth: Payer: Self-pay

## 2021-12-19 NOTE — Telephone Encounter (Addendum)
Received CM from [place] at [place] on 9/11 that she received order for lift chair. Call placed to patient and gave number that she can call for delivery [phone]). She was very Patent attorney.

## 2021-12-19 NOTE — Telephone Encounter (Signed)
Patient called regarding a lift chair,patient stated the number that was provided "has nothing to do with a lift chair and that they don't know what she is talking about" patient is requesting a call back

## 2021-12-20 ENCOUNTER — Ambulatory Visit
Admission: RE | Admit: 2021-12-20 | Discharge: 2021-12-20 | Disposition: A | Discharge status: Home / Self Care | Payer: BC Managed Care – PPO | Source: Ambulatory Visit | Attending: Orthopedic Surgery | Admitting: Orthopedic Surgery

## 2021-12-20 DIAGNOSIS — M1711 Unilateral primary osteoarthritis, right knee: Secondary | ICD-10-CM

## 2021-12-20 DIAGNOSIS — M25561 Pain in right knee: Secondary | ICD-10-CM | POA: Diagnosis not present

## 2021-12-20 NOTE — Telephone Encounter (Signed)
Riki Sheer, Orvis Brill, RN; Stephannie Peters; [place], [place], [place]; Minus [place]; [place], Leory Plowman; 1 other  I sent them an email yesterday to call her.

## 2021-12-20 NOTE — Telephone Encounter (Signed)
Patient notified that someone from [employer] should be calling her today.

## 2021-12-21 NOTE — Telephone Encounter (Signed)
Patient came in via power w/c requesting information on lift chair. Spoke with Andee Poles at [facility]. States patient will need to go to [employer] in [place] and speak with [place]. She is unsure if insurance will cover this item.  Spoke with [facility] [phone]. States insurance will only cover the lift mechanism ~$288. Rest will be out of pocket. There is a form that patient will need to bring to [facility] for Provider to complete.

## 2021-12-23 ENCOUNTER — Other Ambulatory Visit: Payer: BC Managed Care – PPO

## 2021-12-26 ENCOUNTER — Ambulatory Visit (INDEPENDENT_AMBULATORY_CARE_PROVIDER_SITE_OTHER): Payer: BC Managed Care – PPO | Admitting: Family

## 2021-12-26 DIAGNOSIS — M1711 Unilateral primary osteoarthritis, right knee: Secondary | ICD-10-CM | POA: Diagnosis not present

## 2021-12-27 ENCOUNTER — Encounter: Payer: Self-pay | Admitting: Family

## 2021-12-27 NOTE — Progress Notes (Signed)
Office Visit Note   Patient: Stacy Thomas           Date of Birth: [birth year]           MRN: [id number] Visit Date: 12/26/2021              Requested by: Sanjuan Dame, MD [address] [place],  [place] [address] PCP: Sanjuan Dame, MD  Chief Complaint  Patient presents with   Right Knee - Follow-up    MRI review       HPI: The patient is a 61 year old woman who presents today for MRI review MRI of the right knee.  She is status post an injection and bracing complaining of pain and weakness all the time  She has discussed total knee arthroplasty with Dr. Sharol Given and would like to proceed  She is status post left above-knee amputation due to a sort of osteosarcoma so an MRI was done to further evaluate the right lower extremity  Assessment & Plan: Visit Diagnoses: No diagnosis found.  Plan: No sign of osteosarcoma on MRI of the right knee.  Significant degenerative changes associate with osteoarthritis.  Patient in agreement with the plan to proceed with total knee arthroplasty on the right.  We will set this up at her convenience.  Follow-Up Instructions: No follow-ups on file.   Ortho Exam  Patient is alert, oriented, no adenopathy, well-dressed, normal affect, normal respiratory effort. On examination of the right knee there is no redness no cellulitis no swelling she has pain with flexion lacks full extension  Imaging: No results found. No images are attached to the encounter.  Labs: Lab Results  Component Value Date   HGBA1C 5.8 (H) 12/05/2021   HGBA1C 5.9 (H) 02/20/2021   HGBA1C 5.9 (H) 02/29/2020     Lab Results  Component Value Date   ALBUMIN 4.6 12/05/2021   ALBUMIN 4.5 11/10/2021   ALBUMIN 4.5 05/23/2021    No results found for: "MG" Lab Results  Component Value Date   VD25OH 15.8 (L) 05/03/2016    No results found for: "PREALBUMIN"    Latest Ref Rng & Units 12/05/2021    9:56 AM 11/10/2021    9:39 AM 05/23/2021   11:23 AM  CBC EXTENDED   WBC 3.4 - 10.8 x10E3/uL 4.2  4.1  4.8   RBC 3.77 - 5.28 x10E6/uL 4.53  4.43  4.58   Hemoglobin 11.1 - 15.9 g/dL 13.5  13.2  13.4   HCT 34.0 - 46.6 % 39.8  38.6  40.2   Platelets 150 - 450 x10E3/uL 210  225.0  263.0   NEUT# 1.4 - 7.0 x10E3/uL 1.8  1.4  2.0   Lymph# 0.7 - 3.1 x10E3/uL 1.9  2.2  2.2      There is no height or weight on file to calculate BMI.  Orders:  No orders of the defined types were placed in this encounter.  No orders of the defined types were placed in this encounter.    Procedures: No procedures performed  Clinical Data: No additional findings.  ROS:  All other systems negative, except as noted in the HPI. Review of Systems  Objective: Vital Signs: LMP 12/02/2010   Specialty Comments:  No specialty comments available.  PMFS History: Patient Active Problem List   Diagnosis Date Noted   Malaise and fatigue 12/05/2021   Prediabetes 02/23/2021   Hair loss 02/23/2021   Memory changes 03/01/2020   CKD (chronic kidney disease) 09/11/2019   HLD (hyperlipidemia)  09/09/2018   Allergic sinusitis 12/03/2017   Hx of AKA (above knee amputation), left ([place]) 02/18/2017   Unilateral primary osteoarthritis, right knee 02/18/2017   Vitamin D deficiency 05/04/2016   Bilateral primary osteoarthritis of hip 04/26/2016   Bilateral carpal tunnel syndrome 03/29/2016   Screening for cervical cancer 03/06/2016   PVD (peripheral vascular disease) ([place]) 03/05/2016   Pancreatic abnormality 03/05/2016   Hypertension 03/05/2016   Hepatic cirrhosis ([place]) 11/15/2015   Chronic hepatitis C ([place]) 06/15/2015   Prolapsed internal hemorrhoids, grade 4 06/14/2015   Hx of osteosarcoma 06/14/2015   Past Medical History:  Diagnosis Date   Bilateral arm pain    chronic, due to use of crutches   Dependence on crutches    left leg prosthesis   Drug addiction in remission ([place])    since 2007  (crack cocaine,  IV drug use)   Hand weakness    Hepatic cirrhosis ([place]) 2018    followed by dr Havery Moros (gi) due to hx chronic hepatitis c   Hiatal hernia    History of esophageal stricture    post dilation x2   History of hepatitis C    pt completed harvoni treatment 2017, cured;  genotype 1a with fibrosis 3/4,  previously seen by dr comer   History of osteosarcoma    1982  left knee---  s/p  AKA LLE--   per pt no recurrence   History of recreational drug use multiple rehab visits   IV drug use for 1 yr and Smoked crack for 30+yrs--  per pt clean since 2007   Hyperlipidemia    Hypertension    Idiopathic peripheral neuropathy    OSA (obstructive sleep apnea)    study in epic 10-27-2018 by dr Rexene Alberts,  moderate osa,  pt stated used cpap until approx. 07/ 2021, intolerate of mask   Osteoarthritis    Pancreatic cyst    followed by dr Havery Moros--- per office note bening and stable   Sigmoid diverticulosis    mild   Wears glasses     Family History  Problem Relation Age of Onset   COPD Father    Alcohol abuse Father        Deceased   Hypertension Mother        Living   Healthy Daughter    Healthy Son    Anesthesia problems Neg Hx    Breast cancer Neg Hx    Colon cancer Neg Hx    Esophageal cancer Neg Hx    Rectal cancer Neg Hx    Stomach cancer Neg Hx    Colon polyps Neg Hx     Past Surgical History:  Procedure Laterality Date   ABOVE KNEE LEG AMPUTATION Left 1982   osteosarcoma   CARPAL TUNNEL RELEASE Right 10/18/2021   Procedure: RIGHT CARPAL TUNNEL RELEASE;  Surgeon: Leandrew Koyanagi, MD;  Location: [facility];  Service: Orthopedics;  Laterality: Right;   COLONOSCOPY WITH PROPOFOL  last one 04-28-2020  dr Havery Moros   ESOPHAGOGASTRODUODENOSCOPY (EGD) WITH PROPOFOL  last one 11-02-2019  dr Havery Moros   EXCISION BREAST BX  2015   CYST   EXCISION OF SKIN TAG N/A 07/14/2020   Procedure: EXCISION OF ANAL PAPILLA;  Surgeon: Leighton Ruff, MD;  Location: [facility];  Service: General;  Laterality: N/A;   STERIOD INJECTION  Left 10/18/2021   Procedure: LEFT CARPAL TUNNEL INJECTION;  Surgeon: Leandrew Koyanagi, MD;  Location: [facility];  Service: Orthopedics;  Laterality:  Left;   TONSILLECTOMY  as child   TRANSANAL HEMORRHOIDAL DEARTERIALIZATION N/A 10/27/2015   Procedure: TRANSANAL HEMORRHOIDAL DEARTERIALIZATION;  Surgeon: Michael Boston, MD;  Location: [facility];  Service: General;  Laterality: N/A;   UPPER GASTROINTESTINAL ENDOSCOPY     Social History   Occupational History   Not on file  Tobacco Use   Smoking status: Former    Packs/day: 1.00    Years: 37.00    Total pack years: 37.00    Types: Cigarettes    Start date: 04/02/1973    Quit date: 12/19/2014    Years since quitting: 7.0   Smokeless tobacco: Never  Vaping Use   Vaping Use: Never used  Substance and Sexual Activity   Alcohol use: Never    Alcohol/week: 0.0 standard drinks of alcohol   Drug use: Not Currently    Types: "Crack" cocaine    Comment: recovery addict since 2007   Sexual activity: Not on file

## 2022-01-03 ENCOUNTER — Telehealth: Payer: Self-pay | Admitting: Student

## 2022-01-03 ENCOUNTER — Other Ambulatory Visit: Payer: Self-pay | Admitting: Internal Medicine

## 2022-01-03 DIAGNOSIS — Z1231 Encounter for screening mammogram for malignant neoplasm of breast: Secondary | ICD-10-CM

## 2022-01-03 NOTE — Telephone Encounter (Signed)
Pt called to report she went to the [employer] store on [address] as instructed but, they are closed and have shut their office down.  Pt states wishes to use [facility] and will need a Prescription with and DX faxed to their office in order to help her obtain a DME order for a Brewing technologist).  Please call the patient back to confirm this new information.

## 2022-01-05 NOTE — Telephone Encounter (Signed)
Spoke with Gwinda Passe at [facility]. States they only file [place] Part [place]. They will make sure patient has everything she needs to file her insurance. Order for lift chair along with chart notes and demographics faxed to [facility] at [phone]. Fax confirmation receipt received. Patient notified and is very Patent attorney.

## 2022-01-08 ENCOUNTER — Telehealth: Payer: Self-pay | Admitting: Orthopedic Surgery

## 2022-01-08 NOTE — Telephone Encounter (Signed)
Pt would like to know when her surgery will be. She states she is in moore pain now

## 2022-01-08 NOTE — Telephone Encounter (Signed)
Please see below.

## 2022-01-08 NOTE — Telephone Encounter (Signed)
Pt saw Junie Panning 12/26/21 did she fill out a blue sheet for a total joint on this pt? See message below she is ready to schedule.

## 2022-01-26 NOTE — Pre-Procedure Instructions (Signed)
Surgical Instructions    Your procedure is scheduled on Wednesday, November 8th.  Report to [facility] Main Entrance "A" at 6:30 A.M., then check in with the Admitting office.  Call this number if you have problems the morning of surgery:  [phone]   If you have any questions prior to your surgery date call [phone]: Open Monday-Friday 8am-4pm    Remember:  Do not eat after midnight the night before your surgery  You may drink clear liquids until 5:30 a.m. the morning of your surgery.   Clear liquids allowed are: Water, Non-Citrus Juices (without pulp), Carbonated Beverages, Clear Tea, Black Coffee Only (NO MILK, CREAM OR POWDERED CREAMER of any kind), and Gatorade.    Take these medicines the morning of surgery with A SIP OF WATER  atorvastatin (LIPITOR)   As of today, STOP taking any Aspirin (unless otherwise instructed by your surgeon) Aleve, Naproxen, Ibuprofen, Motrin, Advil, Goody's, BC's, all herbal medications, fish oil, and all vitamins.                     Do NOT Smoke (Tobacco/Vaping) for 24 hours prior to your procedure.  If you use a CPAP at night, you may bring your mask/headgear for your overnight stay.   Contacts, glasses, piercing's, hearing aid's, dentures or partials may not be worn into surgery, please bring cases for these belongings.    For patients admitted to the hospital, discharge time will be determined by your treatment team.   Patients discharged the day of surgery will not be allowed to drive home, and someone needs to stay with them for 24 hours.  SURGICAL WAITING ROOM VISITATION Patients having surgery or a procedure may have no more than 2 support people in the waiting area - these visitors may rotate.   Children under the age of 40 must have an adult with them who is not the patient. If the patient needs to stay at the hospital during part of their recovery, the visitor guidelines for inpatient rooms apply. Pre-op nurse will coordinate an  appropriate time for 1 support person to accompany patient in pre-op.  This support person may not rotate.   Please refer to the [facility] website for the visitor guidelines for Inpatients (after your surgery is over and you are in a regular room).    Special instructions:   [facility]- Preparing For Surgery  Before surgery, you can play an important role. Because skin is not sterile, your skin needs to be as free of germs as possible. You can reduce the number of germs on your skin by washing with CHG (chlorahexidine gluconate) Soap before surgery.  CHG is an antiseptic cleaner which kills germs and bonds with the skin to continue killing germs even after washing.    Oral Hygiene is also important to reduce your risk of infection.  Remember - BRUSH YOUR TEETH THE MORNING OF SURGERY WITH YOUR REGULAR TOOTHPASTE  Please do not use if you have an allergy to CHG or antibacterial soaps. If your skin becomes reddened/irritated stop using the CHG.  Do not shave (including legs and underarms) for at least 48 hours prior to first CHG shower. It is OK to shave your face.  Please follow these instructions carefully.   Shower the NIGHT BEFORE SURGERY and the MORNING OF SURGERY  If you chose to wash your hair, wash your hair first as usual with your normal shampoo.  After you shampoo, rinse your hair and body thoroughly to  remove the shampoo.  Use CHG Soap as you would any other liquid soap. You can apply CHG directly to the skin and wash gently with a scrungie or a clean washcloth.   Apply the CHG Soap to your body ONLY FROM THE NECK DOWN.  Do not use on open wounds or open sores. Avoid contact with your eyes, ears, mouth and genitals (private parts). Wash Face and genitals (private parts)  with your normal soap.   Wash thoroughly, paying special attention to the area where your surgery will be performed.  Thoroughly rinse your body with warm water from the neck down.  DO NOT shower/wash with  your normal soap after using and rinsing off the CHG Soap.  Pat yourself dry with a CLEAN TOWEL.  Wear CLEAN PAJAMAS to bed the night before surgery  Place CLEAN SHEETS on your bed the night before your surgery  DO NOT SLEEP WITH PETS.   Day of Surgery: Take a shower with CHG soap. Do not wear jewelry or makeup Do not wear lotions, powders, perfumes, or deodorant. Do not shave 48 hours prior to surgery.  Do not bring valuables to the hospital.  [facility] is not responsible for any belongings or valuables. Do not wear nail polish, gel polish, artificial nails, or any other type of covering on natural nails (fingers and toes) If you have artificial nails or gel coating that need to be removed by a nail salon, please have this removed prior to surgery. Artificial nails or gel coating may interfere with anesthesia's ability to adequately monitor your vital signs. Wear Clean/Comfortable clothing the morning of surgery Remember to brush your teeth WITH YOUR REGULAR TOOTHPASTE.   Please read over the following fact sheets that you were given.    If you received a COVID test during your pre-op visit  it is requested that you wear a mask when out in public, stay away from anyone that may not be feeling well and notify your surgeon if you develop symptoms. If you have been in contact with anyone that has tested positive in the last 10 days please notify you surgeon.

## 2022-01-29 ENCOUNTER — Encounter (HOSPITAL_COMMUNITY): Payer: Self-pay

## 2022-01-29 ENCOUNTER — Other Ambulatory Visit: Payer: Self-pay

## 2022-01-29 ENCOUNTER — Encounter (HOSPITAL_COMMUNITY)
Admission: RE | Admit: 2022-01-29 | Discharge: 2022-01-29 | Disposition: A | Discharge status: Home / Self Care | Payer: BC Managed Care – PPO | Source: Ambulatory Visit | Attending: Orthopedic Surgery | Admitting: Orthopedic Surgery

## 2022-01-29 VITALS — BP 102/69 | HR 77 | Temp 97.9°F | Resp 17 | Ht 60.0 in | Wt 149.8 lb

## 2022-01-29 DIAGNOSIS — Z01812 Encounter for preprocedural laboratory examination: Secondary | ICD-10-CM | POA: Diagnosis not present

## 2022-01-29 DIAGNOSIS — K759 Inflammatory liver disease, unspecified: Secondary | ICD-10-CM | POA: Insufficient documentation

## 2022-01-29 DIAGNOSIS — Z01818 Encounter for other preprocedural examination: Secondary | ICD-10-CM

## 2022-01-29 LAB — COMPREHENSIVE METABOLIC PANEL
ALT: 25 U/L (ref 0–44)
AST: 29 U/L (ref 15–41)
Albumin: 3.8 g/dL (ref 3.5–5.0)
Alkaline Phosphatase: 107 U/L (ref 38–126)
Anion gap: 8 (ref 5–15)
BUN: 12 mg/dL (ref 8–23)
CO2: 25 mmol/L (ref 22–32)
Calcium: 9.8 mg/dL (ref 8.9–10.3)
Chloride: 107 mmol/L (ref 98–111)
Creatinine, Ser: 1.06 mg/dL — ABNORMAL HIGH (ref 0.44–1.00)
GFR, Estimated: 59 mL/min — ABNORMAL LOW (ref >60)
Glucose, Bld: 102 mg/dL — ABNORMAL HIGH (ref 70–99)
Potassium: 3.7 mmol/L (ref 3.5–5.1)
Sodium: 140 mmol/L (ref 135–145)
Total Bilirubin: 0.8 mg/dL (ref 0.3–1.2)
Total Protein: 6.9 g/dL (ref 6.5–8.1)

## 2022-01-29 LAB — CBC
HCT: 40.8 % (ref 36.0–46.0)
Hemoglobin: 13.7 g/dL (ref 12.0–15.0)
MCH: 30 pg (ref 26.0–34.0)
MCHC: 33.6 g/dL (ref 30.0–36.0)
MCV: 89.5 fL (ref 80.0–100.0)
Platelets: 215 10*3/uL (ref 150–400)
RBC: 4.56 MIL/uL (ref 3.87–5.11)
RDW: 12.8 % (ref 11.5–15.5)
WBC: 4.1 10*3/uL (ref 4.0–10.5)
nRBC: 0 % (ref 0.0–0.2)

## 2022-01-29 LAB — SURGICAL PCR SCREEN
MRSA, PCR: NEGATIVE
Staphylococcus aureus: NEGATIVE

## 2022-01-29 NOTE — Progress Notes (Signed)
PCP - Dr. Sanjuan Dame Cardiologist - denies  PPM/ICD - n/a  Chest x-ray - n/a EKG - 10/17/21 Stress Test - denies ECHO - denies Cardiac Cath - denies  Sleep Study - OSA+ CPAP - uses nightly  Last dose of GLP1 agonist- n/a  GLP1 instructions: n/a  Blood Thinner Instructions: n/a Aspirin Instructions: n/a  ERAS Protcol -clear liquids until 0530 DOS PRE-SURGERY Ensure or G2- none ordered  COVID TEST- n/a  Anesthesia review: no  Patient denies shortness of breath, fever, cough and chest pain at PAT appointment   All instructions explained to the patient, with a verbal understanding of the material. Patient agrees to go over the instructions while at home for a better understanding. Patient also instructed to self quarantine after being tested for COVID-19. The opportunity to ask questions was provided.

## 2022-02-01 ENCOUNTER — Telehealth: Payer: Self-pay

## 2022-02-01 DIAGNOSIS — K7460 Unspecified cirrhosis of liver: Secondary | ICD-10-CM

## 2022-02-01 NOTE — Telephone Encounter (Signed)
-----   Message from Yevette Edwards, RN sent at 08/01/2021  3:51 PM EDT ----- Regarding: [place] RUQ [place] - cirrhosis, [place] screening Need to enter order

## 2022-02-01 NOTE — Telephone Encounter (Signed)
RUQ [place] order in epic. Secure staff message sent to radiology scheduling to contact patient to set up appt.   Called and spoke with patient to remind her that she is due for routine RUQ [place] at this time. Pt knows to expect a call from radiology scheduling to set up her appt. Pt verbalized understanding and had no concerns at the end of the call.

## 2022-02-06 ENCOUNTER — Ambulatory Visit: Payer: BC Managed Care – PPO

## 2022-02-06 MED ORDER — TRANEXAMIC ACID 1000 MG/10ML IV SOLN
2000.0000 mg | INTRAVENOUS | Status: DC
  Filled 2022-02-06: qty 20

## 2022-02-06 NOTE — Anesthesia Preprocedure Evaluation (Addendum)
Anesthesia Evaluation  Patient identified by MRN, date of birth, ID band Patient awake  General Assessment Comment:S/P left BKA secondary to bone cancer 35 years ago.  Chronic pain issues in neck, back, stump, right hand. Took neurontin today.  Reviewed: Allergy & Precautions, NPO status , Patient's Chart, lab work & pertinent test results  History of Anesthesia Complications Negative for: history of anesthetic complications  Airway Mallampati: I  TM Distance: >3 FB Neck ROM: Full    Dental  (+) Poor Dentition, Edentulous Upper, Dental Advisory Given,    Pulmonary sleep apnea , former smoker   Pulmonary exam normal        Cardiovascular hypertension, Pt. on medications Normal cardiovascular exam     Neuro/Psych  Neuromuscular disease  negative psych ROS   GI/Hepatic hiatal hernia,,,(+) Cirrhosis     substance abuse  , Hepatitis -, CCrack cocaine use many years ago. Chronic hepatitis with some abdominal pain. She avoids acetaminophen.   Endo/Other  negative endocrine ROS    Renal/GU   negative genitourinary   Musculoskeletal  (+) Arthritis ,    Abdominal   Peds negative pediatric ROS (+)  Hematology negative hematology ROS (+)   Anesthesia Other Findings   Reproductive/Obstetrics negative OB ROS                             Anesthesia Physical Anesthesia Plan  ASA: 3  Anesthesia Plan: Regional and General   Post-op Pain Management: Regional block*   Induction: Intravenous  PONV Risk Score and Plan: 3 and Ondansetron, Midazolam, Propofol infusion, Treatment may vary due to age or medical condition and Dexamethasone  Airway Management Planned: Oral ETT and LMA  Additional Equipment: None  Intra-op Plan:   Post-operative Plan: Extubation in OR  Informed Consent: I have reviewed the patients History and Physical, chart, labs and discussed the procedure including the risks,  benefits and alternatives for the proposed anesthesia with the patient or authorized representative who has indicated his/her understanding and acceptance.     Dental advisory given  Plan Discussed with: Anesthesiologist and CRNA  Anesthesia Plan Comments: ( )        Anesthesia Quick Evaluation

## 2022-02-07 ENCOUNTER — Other Ambulatory Visit: Payer: Self-pay

## 2022-02-07 ENCOUNTER — Encounter (HOSPITAL_COMMUNITY)
Admission: RE | Disposition: A | Discharge status: Home Health | Payer: Self-pay | Source: Home / Self Care | Attending: Orthopedic Surgery

## 2022-02-07 ENCOUNTER — Ambulatory Visit (HOSPITAL_COMMUNITY): Payer: BC Managed Care – PPO | Admitting: Certified Registered Nurse Anesthetist

## 2022-02-07 ENCOUNTER — Observation Stay (HOSPITAL_COMMUNITY)
Admission: RE | Admit: 2022-02-07 | Discharge: 2022-02-08 | Disposition: A | Discharge status: Home Health | Payer: BC Managed Care – PPO | Attending: Orthopedic Surgery | Admitting: Orthopedic Surgery

## 2022-02-07 DIAGNOSIS — Z87891 Personal history of nicotine dependence: Secondary | ICD-10-CM | POA: Insufficient documentation

## 2022-02-07 DIAGNOSIS — I129 Hypertensive chronic kidney disease with stage 1 through stage 4 chronic kidney disease, or unspecified chronic kidney disease: Secondary | ICD-10-CM | POA: Diagnosis not present

## 2022-02-07 DIAGNOSIS — G4730 Sleep apnea, unspecified: Secondary | ICD-10-CM | POA: Diagnosis not present

## 2022-02-07 DIAGNOSIS — N189 Chronic kidney disease, unspecified: Secondary | ICD-10-CM | POA: Diagnosis not present

## 2022-02-07 DIAGNOSIS — G8918 Other acute postprocedural pain: Secondary | ICD-10-CM | POA: Diagnosis not present

## 2022-02-07 DIAGNOSIS — Z89612 Acquired absence of left leg above knee: Secondary | ICD-10-CM | POA: Insufficient documentation

## 2022-02-07 DIAGNOSIS — M1711 Unilateral primary osteoarthritis, right knee: Secondary | ICD-10-CM | POA: Diagnosis not present

## 2022-02-07 DIAGNOSIS — Z96651 Presence of right artificial knee joint: Secondary | ICD-10-CM

## 2022-02-07 DIAGNOSIS — I10 Essential (primary) hypertension: Secondary | ICD-10-CM | POA: Diagnosis not present

## 2022-02-07 HISTORY — PX: TOTAL KNEE ARTHROPLASTY: SHX125

## 2022-02-07 SURGERY — ARTHROPLASTY, KNEE, TOTAL
Anesthesia: Regional | Site: Knee | Laterality: Right

## 2022-02-07 MED ORDER — SODIUM CHLORIDE 0.9 % IR SOLN
Status: DC | PRN
  Administered 2022-02-07: 3000 mL

## 2022-02-07 MED ORDER — BISACODYL 10 MG RE SUPP: 10.0000 mg | Freq: Every day | RECTAL | Status: DC | PRN

## 2022-02-07 MED ORDER — PROPOFOL 10 MG/ML IV BOLUS
INTRAVENOUS | Status: AC
  Filled 2022-02-07: qty 20

## 2022-02-07 MED ORDER — SUGAMMADEX SODIUM 200 MG/2ML IV SOLN
INTRAVENOUS | Status: DC | PRN
  Administered 2022-02-07: 120 mg via INTRAVENOUS

## 2022-02-07 MED ORDER — METHOCARBAMOL 500 MG PO TABS
500.0000 mg | ORAL_TABLET | Freq: Four times a day (QID) | ORAL | Status: DC | PRN
  Administered 2022-02-07 (×2): 500 mg via ORAL
  Filled 2022-02-07: qty 1

## 2022-02-07 MED ORDER — PHENOL 1.4 % MT LIQD: 1.0000 | OROMUCOSAL | Status: DC | PRN

## 2022-02-07 MED ORDER — FENTANYL CITRATE (PF) 100 MCG/2ML IJ SOLN
INTRAMUSCULAR | Status: AC
  Filled 2022-02-07: qty 2

## 2022-02-07 MED ORDER — FENTANYL CITRATE (PF) 250 MCG/5ML IJ SOLN
INTRAMUSCULAR | Status: DC | PRN
  Administered 2022-02-07 (×5): 50 ug via INTRAVENOUS

## 2022-02-07 MED ORDER — MAGNESIUM CITRATE PO SOLN: 1.0000 | Freq: Once | ORAL | Status: DC | PRN

## 2022-02-07 MED ORDER — ONDANSETRON HCL 4 MG/2ML IJ SOLN
4.0000 mg | Freq: Once | INTRAMUSCULAR | Status: AC | PRN
  Administered 2022-02-07: 4 mg via INTRAVENOUS

## 2022-02-07 MED ORDER — LISINOPRIL 20 MG PO TABS
20.0000 mg | ORAL_TABLET | Freq: Every day | ORAL | Status: DC
  Administered 2022-02-08: 20 mg via ORAL
  Filled 2022-02-07: qty 1

## 2022-02-07 MED ORDER — LACTATED RINGERS IV SOLN: INTRAVENOUS | Status: DC

## 2022-02-07 MED ORDER — OXYCODONE HCL 5 MG PO TABS: 5.0000 mg | ORAL_TABLET | Freq: Once | ORAL | Status: DC | PRN

## 2022-02-07 MED ORDER — PHENYLEPHRINE 80 MCG/ML (10ML) SYRINGE FOR IV PUSH (FOR BLOOD PRESSURE SUPPORT)
PREFILLED_SYRINGE | INTRAVENOUS | Status: DC | PRN
  Administered 2022-02-07 (×3): 80 ug via INTRAVENOUS

## 2022-02-07 MED ORDER — CEFAZOLIN SODIUM-DEXTROSE 2-4 GM/100ML-% IV SOLN
2.0000 g | INTRAVENOUS | Status: AC
  Administered 2022-02-07: 2 g via INTRAVENOUS
  Filled 2022-02-07: qty 100

## 2022-02-07 MED ORDER — CEFAZOLIN SODIUM-DEXTROSE 1-4 GM/50ML-% IV SOLN
1.0000 g | Freq: Four times a day (QID) | INTRAVENOUS | Status: AC
  Administered 2022-02-07 – 2022-02-08 (×2): 1 g via INTRAVENOUS
  Filled 2022-02-07 (×2): qty 50

## 2022-02-07 MED ORDER — MEPERIDINE HCL 25 MG/ML IJ SOLN: 6.2500 mg | INTRAMUSCULAR | Status: DC | PRN

## 2022-02-07 MED ORDER — APOAEQUORIN 10 MG PO CAPS: 10.0000 mg | ORAL_CAPSULE | Freq: Every day | ORAL | Status: DC

## 2022-02-07 MED ORDER — HYDROMORPHONE HCL 1 MG/ML IJ SOLN: 0.5000 mg | INTRAMUSCULAR | Status: DC | PRN

## 2022-02-07 MED ORDER — MIDAZOLAM HCL 2 MG/2ML IJ SOLN
INTRAMUSCULAR | Status: AC
  Filled 2022-02-07: qty 2

## 2022-02-07 MED ORDER — MENTHOL 3 MG MT LOZG: 1.0000 | LOZENGE | OROMUCOSAL | Status: DC | PRN

## 2022-02-07 MED ORDER — PROPOFOL 10 MG/ML IV BOLUS
INTRAVENOUS | Status: DC | PRN
  Administered 2022-02-07: 80 mg via INTRAVENOUS

## 2022-02-07 MED ORDER — METHOCARBAMOL 500 MG PO TABS
ORAL_TABLET | ORAL | Status: AC
  Filled 2022-02-07: qty 1

## 2022-02-07 MED ORDER — OXYCODONE HCL 5 MG PO TABS
5.0000 mg | ORAL_TABLET | ORAL | Status: DC | PRN
  Administered 2022-02-07: 10 mg via ORAL

## 2022-02-07 MED ORDER — ONDANSETRON HCL 4 MG/2ML IJ SOLN
INTRAMUSCULAR | Status: DC | PRN
  Administered 2022-02-07: 4 mg via INTRAVENOUS

## 2022-02-07 MED ORDER — TRANEXAMIC ACID 1000 MG/10ML IV SOLN
INTRAVENOUS | Status: DC | PRN
  Administered 2022-02-07: 2000 mg via TOPICAL

## 2022-02-07 MED ORDER — FENTANYL CITRATE (PF) 100 MCG/2ML IJ SOLN
25.0000 ug | INTRAMUSCULAR | Status: DC | PRN
  Administered 2022-02-07 (×3): 50 ug via INTRAVENOUS

## 2022-02-07 MED ORDER — TRANEXAMIC ACID-NACL 1000-0.7 MG/100ML-% IV SOLN
1000.0000 mg | INTRAVENOUS | Status: AC
  Administered 2022-02-07: 1000 mg via INTRAVENOUS
  Filled 2022-02-07: qty 100

## 2022-02-07 MED ORDER — OXYCODONE HCL 5 MG PO TABS
10.0000 mg | ORAL_TABLET | ORAL | Status: DC | PRN
  Administered 2022-02-07: 10 mg via ORAL
  Filled 2022-02-07: qty 2

## 2022-02-07 MED ORDER — FENTANYL CITRATE (PF) 250 MCG/5ML IJ SOLN
INTRAMUSCULAR | Status: AC
  Filled 2022-02-07: qty 5

## 2022-02-07 MED ORDER — METHOCARBAMOL 1000 MG/10ML IJ SOLN: 500.0000 mg | Freq: Four times a day (QID) | INTRAVENOUS | Status: DC | PRN

## 2022-02-07 MED ORDER — ORAL CARE MOUTH RINSE: 15.0000 mL | Freq: Once | OROMUCOSAL | Status: AC

## 2022-02-07 MED ORDER — ROCURONIUM BROMIDE 10 MG/ML (PF) SYRINGE
PREFILLED_SYRINGE | INTRAVENOUS | Status: DC | PRN
  Administered 2022-02-07: 50 mg via INTRAVENOUS

## 2022-02-07 MED ORDER — CHLORHEXIDINE GLUCONATE 0.12 % MT SOLN
15.0000 mL | Freq: Once | OROMUCOSAL | Status: AC
  Administered 2022-02-07: 15 mL via OROMUCOSAL
  Filled 2022-02-07: qty 15

## 2022-02-07 MED ORDER — OXYCODONE HCL 5 MG/5ML PO SOLN: 5.0000 mg | Freq: Once | ORAL | Status: DC | PRN

## 2022-02-07 MED ORDER — POLYETHYLENE GLYCOL 3350 17 G PO PACK: 17.0000 g | PACK | Freq: Every day | ORAL | Status: DC | PRN

## 2022-02-07 MED ORDER — DEXAMETHASONE SODIUM PHOSPHATE 10 MG/ML IJ SOLN
INTRAMUSCULAR | Status: DC | PRN
  Administered 2022-02-07: 10 mg via INTRAVENOUS

## 2022-02-07 MED ORDER — LIDOCAINE 2% (20 MG/ML) 5 ML SYRINGE
INTRAMUSCULAR | Status: DC | PRN
  Administered 2022-02-07: 60 mg via INTRAVENOUS

## 2022-02-07 MED ORDER — 0.9 % SODIUM CHLORIDE (POUR BTL) OPTIME
TOPICAL | Status: DC | PRN
  Administered 2022-02-07: 1000 mL

## 2022-02-07 MED ORDER — ACETAMINOPHEN 325 MG PO TABS: 325.0000 mg | ORAL_TABLET | Freq: Four times a day (QID) | ORAL | Status: DC | PRN

## 2022-02-07 MED ORDER — METOCLOPRAMIDE HCL 5 MG/ML IJ SOLN
5.0000 mg | Freq: Three times a day (TID) | INTRAMUSCULAR | Status: DC | PRN
  Administered 2022-02-07: 5 mg via INTRAVENOUS

## 2022-02-07 MED ORDER — ONDANSETRON HCL 4 MG/2ML IJ SOLN
4.0000 mg | Freq: Four times a day (QID) | INTRAMUSCULAR | Status: DC | PRN
  Administered 2022-02-07: 4 mg via INTRAVENOUS
  Filled 2022-02-07: qty 2

## 2022-02-07 MED ORDER — MIDAZOLAM HCL 2 MG/2ML IJ SOLN
INTRAMUSCULAR | Status: DC | PRN
  Administered 2022-02-07: 2 mg via INTRAVENOUS

## 2022-02-07 MED ORDER — ROPIVACAINE HCL 5 MG/ML IJ SOLN
INTRAMUSCULAR | Status: DC | PRN
  Administered 2022-02-07: 25 mL via PERINEURAL

## 2022-02-07 MED ORDER — METOCLOPRAMIDE HCL 5 MG/ML IJ SOLN
INTRAMUSCULAR | Status: AC
  Filled 2022-02-07: qty 2

## 2022-02-07 MED ORDER — OXYCODONE HCL 5 MG PO TABS
ORAL_TABLET | ORAL | Status: AC
  Filled 2022-02-07: qty 3

## 2022-02-07 MED ORDER — DIPHENHYDRAMINE HCL 12.5 MG/5ML PO ELIX: 12.5000 mg | ORAL_SOLUTION | ORAL | Status: DC | PRN

## 2022-02-07 MED ORDER — ATORVASTATIN CALCIUM 40 MG PO TABS
40.0000 mg | ORAL_TABLET | Freq: Every day | ORAL | Status: DC
  Administered 2022-02-08: 40 mg via ORAL
  Filled 2022-02-07: qty 1

## 2022-02-07 MED ORDER — DOCUSATE SODIUM 100 MG PO CAPS
100.0000 mg | ORAL_CAPSULE | Freq: Two times a day (BID) | ORAL | Status: DC
  Administered 2022-02-07 – 2022-02-08 (×2): 100 mg via ORAL
  Filled 2022-02-07 (×2): qty 1

## 2022-02-07 MED ORDER — ONDANSETRON HCL 4 MG PO TABS: 4.0000 mg | ORAL_TABLET | Freq: Three times a day (TID) | ORAL | Status: DC | PRN

## 2022-02-07 MED ORDER — ACETAMINOPHEN 160 MG/5ML PO SOLN: 325.0000 mg | ORAL | Status: DC | PRN

## 2022-02-07 MED ORDER — DEXMEDETOMIDINE HCL IN NACL 80 MCG/20ML IV SOLN
INTRAVENOUS | Status: DC | PRN
  Administered 2022-02-07 (×2): 8 ug via BUCCAL
  Administered 2022-02-07: 4 ug via BUCCAL

## 2022-02-07 MED ORDER — ACETAMINOPHEN 325 MG PO TABS: 325.0000 mg | ORAL_TABLET | ORAL | Status: DC | PRN

## 2022-02-07 MED ORDER — SODIUM CHLORIDE 0.9 % IV SOLN: INTRAVENOUS | Status: DC

## 2022-02-07 MED ORDER — ASPIRIN 325 MG PO TBEC
325.0000 mg | DELAYED_RELEASE_TABLET | Freq: Every day | ORAL | Status: DC
  Administered 2022-02-08: 325 mg via ORAL
  Filled 2022-02-07: qty 1

## 2022-02-07 MED ORDER — ONDANSETRON HCL 4 MG/2ML IJ SOLN
INTRAMUSCULAR | Status: AC
  Filled 2022-02-07: qty 2

## 2022-02-07 MED ORDER — METOCLOPRAMIDE HCL 5 MG PO TABS: 5.0000 mg | ORAL_TABLET | Freq: Three times a day (TID) | ORAL | Status: DC | PRN

## 2022-02-07 MED ORDER — ONDANSETRON HCL 4 MG PO TABS: 4.0000 mg | ORAL_TABLET | Freq: Four times a day (QID) | ORAL | Status: DC | PRN

## 2022-02-07 SURGICAL SUPPLY — 62 items
BAG COUNTER SPONGE SURGICOUNT (BAG) ×2 IMPLANT
BAG SPNG CNTER NS LX DISP (BAG) ×1
BLADE SAGITTAL 25.0X1.19X90 (BLADE) ×2 IMPLANT
BLADE SAW SGTL 13X75X1.27 (BLADE) ×2 IMPLANT
BLADE SURG 21 STRL SS (BLADE) ×4 IMPLANT
BNDG CMPR 5X6 CHSV STRCH STRL (GAUZE/BANDAGES/DRESSINGS) ×1
BNDG COHESIVE 6X5 TAN ST LF (GAUZE/BANDAGES/DRESSINGS) IMPLANT
BNDG COHESIVE 6X5 TAN STRL LF (GAUZE/BANDAGES/DRESSINGS) ×4 IMPLANT
BNDG GAUZE DERMACEA FLUFF 4 (GAUZE/BANDAGES/DRESSINGS) ×2 IMPLANT
BNDG GZE DERMACEA 4 6PLY (GAUZE/BANDAGES/DRESSINGS) ×1
BOWL SMART MIX CTS (DISPOSABLE) ×2 IMPLANT
BSPLAT TIB E 2 PG STRL KN RT (Stem) ×1 IMPLANT
COMP FEM CR PERS SZ7 RT (Joint) ×1 IMPLANT
COMPONENT FEM CR PERS SZ7 RT (Joint) IMPLANT
COOLER ICEMAN CLASSIC (MISCELLANEOUS) ×2 IMPLANT
COVER SURGICAL LIGHT HANDLE (MISCELLANEOUS) ×2 IMPLANT
CUFF TOURN SGL QUICK 34 (TOURNIQUET CUFF) ×1
CUFF TOURN SGL QUICK 42 (TOURNIQUET CUFF) IMPLANT
CUFF TRNQT CYL 34X4.125X (TOURNIQUET CUFF) ×2 IMPLANT
DRAPE EXTREMITY T 121X128X90 (DISPOSABLE) ×2 IMPLANT
DRAPE HALF SHEET 40X57 (DRAPES) ×4 IMPLANT
DRAPE U-SHAPE 47X51 STRL (DRAPES) ×2 IMPLANT
DRSG ADAPTIC 3X8 NADH LF (GAUZE/BANDAGES/DRESSINGS) ×2 IMPLANT
DURAPREP 26ML APPLICATOR (WOUND CARE) ×2 IMPLANT
ELECT REM PT RETURN 9FT ADLT (ELECTROSURGICAL) ×1
ELECTRODE REM PT RTRN 9FT ADLT (ELECTROSURGICAL) ×2 IMPLANT
FACESHIELD WRAPAROUND (MASK) IMPLANT
FACESHIELD WRAPAROUND OR TEAM (MASK) ×2 IMPLANT
GAUZE PAD ABD 8X10 STRL (GAUZE/BANDAGES/DRESSINGS) ×2 IMPLANT
GAUZE SPONGE 4X4 12PLY STRL (GAUZE/BANDAGES/DRESSINGS) ×2 IMPLANT
GLOVE BIOGEL PI IND STRL 9 (GLOVE) ×2 IMPLANT
GLOVE SURG ORTHO 9.0 STRL STRW (GLOVE) ×2 IMPLANT
GOWN STRL REUS W/ TWL XL LVL3 (GOWN DISPOSABLE) ×4 IMPLANT
GOWN STRL REUS W/TWL XL LVL3 (GOWN DISPOSABLE) ×2
HANDPIECE INTERPULSE COAX TIP (DISPOSABLE) ×1
HDLS TROCR DRIL PIN KNEE 75 (PIN) ×1
IMPL PATELLA METAL SZ32X10 (Joint) IMPLANT
INSERT TIB ASF SZ 6-7/EF 10 RT (Insert) IMPLANT
KIT BASIN OR (CUSTOM PROCEDURE TRAY) ×2 IMPLANT
KIT TURNOVER KIT B (KITS) ×2 IMPLANT
MANIFOLD NEPTUNE II (INSTRUMENTS) ×2 IMPLANT
NS IRRIG 1000ML POUR BTL (IV SOLUTION) ×2 IMPLANT
PACK TOTAL JOINT (CUSTOM PROCEDURE TRAY) ×2 IMPLANT
PAD ARMBOARD 7.5X6 YLW CONV (MISCELLANEOUS) ×2 IMPLANT
PAD COLD SHLDR WRAP-ON (PAD) ×2 IMPLANT
PIN DRILL HDLS TROCAR 75 4PK (PIN) IMPLANT
SCREW FEMALE HEX FIX 25X2.5 (ORTHOPEDIC DISPOSABLE SUPPLIES) IMPLANT
SET HNDPC FAN SPRY TIP SCT (DISPOSABLE) ×2 IMPLANT
STAPLER VISISTAT 35W (STAPLE) ×2 IMPLANT
STEM TIBIAL TRAB SZE RT (Stem) IMPLANT
SUCTION FRAZIER HANDLE 10FR (MISCELLANEOUS)
SUCTION TUBE FRAZIER 10FR DISP (MISCELLANEOUS) IMPLANT
SUT VIC AB 0 CT1 27 (SUTURE)
SUT VIC AB 0 CT1 27XBRD ANBCTR (SUTURE) ×2 IMPLANT
SUT VIC AB 1 CT1 27 (SUTURE) ×2
SUT VIC AB 1 CT1 27XBRD ANBCTR (SUTURE) IMPLANT
SUT VIC AB 1 CTX 36 (SUTURE)
SUT VIC AB 1 CTX36XBRD ANBCTR (SUTURE) IMPLANT
SUT VIC AB 2-0 CT1 27 (SUTURE) ×2
SUT VIC AB 2-0 CT1 TAPERPNT 27 (SUTURE) IMPLANT
TOWEL GREEN STERILE (TOWEL DISPOSABLE) ×2 IMPLANT
TOWEL GREEN STERILE FF (TOWEL DISPOSABLE) ×2 IMPLANT

## 2022-02-07 NOTE — Transfer of Care (Signed)
Immediate Anesthesia Transfer of Care Note  Patient: Stacy Thomas  Procedure(s) Performed: RIGHT TOTAL KNEE ARTHROPLASTY (Right: Knee)  Patient Location: PACU  Anesthesia Type:General and Regional  Level of Consciousness: drowsy and patient cooperative  Airway & Oxygen Therapy: Patient Spontanous Breathing and Patient connected to face mask oxygen  Post-op Assessment: Report given to RN, Post -op Vital signs reviewed and stable, and Patient moving all extremities X 4  Post vital signs: Reviewed and stable  Last Vitals:  Vitals Value Taken Time  BP 101/68 02/07/22 1002  Temp    Pulse 76 02/07/22 1004  Resp 14 02/07/22 1004  SpO2 98 % 02/07/22 1004  Vitals shown include unvalidated device data.  Last Pain:  Vitals:   02/07/22 0706  TempSrc:   PainSc: 0-No pain         Complications: No notable events documented.

## 2022-02-07 NOTE — Progress Notes (Signed)
PT Cancellation Note  Patient Details Name: Stacy Thomas MRN: [id number] DOB: 1961/01/21   Cancelled Treatment:    Reason Eval/Treat Not Completed: Other (comment) Pt very nauseated and vomiting.  Unable to tolerate therapy.  Will f/u tomorrow. Abran Richard, PT Acute Rehab [facility] [phone]   Karlton Lemon 02/07/2022, 5:30 PM

## 2022-02-07 NOTE — Anesthesia Procedure Notes (Signed)
Procedure Name: Intubation Date/Time: 02/07/2022 8:43 AM  Performed by: Darletta Moll, CRNAPre-anesthesia Checklist: Patient identified, Emergency Drugs available, Suction available and Patient being monitored Patient Re-evaluated:Patient Re-evaluated prior to induction Oxygen Delivery Method: Circle system utilized Preoxygenation: Pre-oxygenation with 100% oxygen Induction Type: IV induction Ventilation: Mask ventilation without difficulty Laryngoscope Size: Mac and 3 Grade View: Grade I Tube type: Oral Tube size: 7.0 mm Number of attempts: 1 Airway Equipment and Method: Stylet Placement Confirmation: ETT inserted through vocal cords under direct vision, positive ETCO2 and breath sounds checked- equal and bilateral Secured at: 21 cm Tube secured with: Tape Dental Injury: Teeth and Oropharynx as per pre-operative assessment

## 2022-02-07 NOTE — H&P (Signed)
TOTAL KNEE ADMISSION H&P  Patient is being admitted for right total knee arthroplasty.  Subjective:  Chief Complaint:right knee pain.  HPI: LAYNE LEBON, 61 y.o. female, has a history of pain and functional disability in the right knee due to arthritis and has failed non-surgical conservative treatments for greater than 12 weeks to includeNSAID's and/or analgesics, corticosteriod injections, use of assistive devices, weight reduction as appropriate, and activity modification.  Onset of symptoms was gradual, starting 8 years ago with gradually worsening course since that time. The patient noted no past surgery on the right knee(s).  Patient currently rates pain in the right knee(s) at 8 out of 10 with activity. Patient has night pain, worsening of pain with activity and weight bearing, pain that interferes with activities of daily living, pain with passive range of motion, crepitus, and joint swelling.  Patient has evidence of subchondral cysts, subchondral sclerosis, periarticular osteophytes, joint subluxation, and joint space narrowing by imaging studies. This patient has had avascular necrosis of the knee. There is no active infection.  Patient Active Problem List   Diagnosis Date Noted   Malaise and fatigue 12/05/2021   Prediabetes 02/23/2021   Hair loss 02/23/2021   Memory changes 03/01/2020   CKD (chronic kidney disease) 09/11/2019   HLD (hyperlipidemia) 09/09/2018   Allergic sinusitis 12/03/2017   Hx of AKA (above knee amputation), left ([place]) 02/18/2017   Unilateral primary osteoarthritis, right knee 02/18/2017   Vitamin D deficiency 05/04/2016   Bilateral primary osteoarthritis of hip 04/26/2016   Bilateral carpal tunnel syndrome 03/29/2016   Screening for cervical cancer 03/06/2016   PVD (peripheral vascular disease) ([place]) 03/05/2016   Pancreatic abnormality 03/05/2016   Hypertension 03/05/2016   Hepatic cirrhosis ([place]) 11/15/2015   Chronic hepatitis C ([place]) 06/15/2015    Prolapsed internal hemorrhoids, grade 4 06/14/2015   Hx of osteosarcoma 06/14/2015   Past Medical History:  Diagnosis Date   Bilateral arm pain    chronic, due to use of crutches   Dependence on crutches    left leg prosthesis   Drug addiction in remission ([place])    since 2007  (crack cocaine,  IV drug use)   Hand weakness    Hepatic cirrhosis ([place]) 2018   followed by dr Havery Moros (gi) due to hx chronic hepatitis c   Hiatal hernia    History of esophageal stricture    post dilation x2   History of hepatitis C    pt completed harvoni treatment 2017, cured;  genotype 1a with fibrosis 3/4,  previously seen by dr comer   History of osteosarcoma    1982  left knee---  s/p  AKA LLE--   per pt no recurrence   History of recreational drug use multiple rehab visits   IV drug use for 1 yr and Smoked crack for 30+yrs--  per pt clean since 2007   Hyperlipidemia    Hypertension    Idiopathic peripheral neuropathy    OSA (obstructive sleep apnea)    study in epic 10-27-2018 by dr Rexene Alberts,  moderate osa,  pt stated used cpap until approx. 07/ 2021, intolerate of mask   Osteoarthritis    Pancreatic cyst    followed by dr Havery Moros--- per office note bening and stable   Sigmoid diverticulosis    mild   Wears glasses     Past Surgical History:  Procedure Laterality Date   ABOVE KNEE LEG AMPUTATION Left 1982   osteosarcoma   CARPAL TUNNEL RELEASE Right 10/18/2021   Procedure:  RIGHT CARPAL TUNNEL RELEASE;  Surgeon: Leandrew Koyanagi, MD;  Location: [facility];  Service: Orthopedics;  Laterality: Right;   COLONOSCOPY WITH PROPOFOL  last one 04-28-2020  dr Havery Moros   ESOPHAGOGASTRODUODENOSCOPY (EGD) WITH PROPOFOL  last one 11-02-2019  dr Havery Moros   EXCISION BREAST BX  2015   CYST   EXCISION OF SKIN TAG N/A 07/14/2020   Procedure: EXCISION OF ANAL PAPILLA;  Surgeon: Leighton Ruff, MD;  Location[facility];  Service: General;  Laterality: N/A;   STERIOD INJECTION  Left 10/18/2021   Procedure: LEFT CARPAL TUNNEL INJECTION;  Surgeon: Leandrew Koyanagi, MD;  Location: [facility];  Service: Orthopedics;  Laterality: Left;   TONSILLECTOMY  as child   TRANSANAL HEMORRHOIDAL DEARTERIALIZATION N/A 10/27/2015   Procedure: TRANSANAL HEMORRHOIDAL DEARTERIALIZATION;  Surgeon: Michael Boston, MD;  Location: [place];  Service: General;  Laterality: N/A;   UPPER GASTROINTESTINAL ENDOSCOPY      Current Facility-Administered Medications  Medication Dose Route Frequency Provider Last Rate Last Admin   tranexamic acid (CYKLOKAPRON) 2,000 mg in sodium chloride 0.9 % 50 mL Topical Application  2,595 mg Topical To OR Newt Minion, MD       Allergies  Allergen Reactions   Morphine Swelling    REACTION: face swells   Codeine Other (See Comments)    Avoids due to being recovery addict    Social History   Tobacco Use   Smoking status: Former    Packs/day: 1.00    Years: 37.00    Total pack years: 37.00    Types: Cigarettes    Start date: 04/02/1973    Quit date: 12/19/2014    Years since quitting: 7.1   Smokeless tobacco: Never  Substance Use Topics   Alcohol use: Never    Alcohol/week: 0.0 standard drinks of alcohol    Family History  Problem Relation Age of Onset   COPD Father    Alcohol abuse Father        Deceased   Hypertension Mother        Living   Healthy Daughter    Healthy Son    Anesthesia problems Neg Hx    Breast cancer Neg Hx    Colon cancer Neg Hx    Esophageal cancer Neg Hx    Rectal cancer Neg Hx    Stomach cancer Neg Hx    Colon polyps Neg Hx      Review of Systems  Objective:  Physical Exam  Vital signs in last 24 hours:    Labs:   Estimated body mass index is 29.26 kg/m as calculated from the following:   Height as of 01/29/22: 5' (1.524 m).   Weight as of 01/29/22: 67.9 kg.   Imaging Review Plain radiographs demonstrate moderate degenerative joint disease of the right knee(s). The  overall alignment ismild varus. The bone quality appears to be adequate for age and reported activity level.      Assessment/Plan:  End stage arthritis, right knee   The patient history, physical examination, clinical judgment of the provider and imaging studies are consistent with end stage degenerative joint disease of the right knee(s) and total knee arthroplasty is deemed medically necessary. The treatment options including medical management, injection therapy arthroscopy and arthroplasty were discussed at length. The risks and benefits of total knee arthroplasty were presented and reviewed. The risks due to aseptic loosening, infection, stiffness, patella tracking problems, thromboembolic complications and other imponderables were discussed. The  patient acknowledged the explanation, agreed to proceed with the plan and consent was signed. Patient is being admitted for inpatient treatment for surgery, pain control, PT, OT, prophylactic antibiotics, VTE prophylaxis, progressive ambulation and ADL's and discharge planning. The patient is planning to be discharged home with home health services     Patient's anticipated LOS is less than 2 midnights, meeting these requirements: - Younger than 26 - Lives within 1 hour of care - Has a competent adult at home to recover with post-op recover - NO history of  - Chronic pain requiring opiods  - Diabetes  - Coronary Artery Disease  - Heart failure  - Heart attack  - Stroke  - DVT/VTE  - Cardiac arrhythmia  - Respiratory Failure/COPD  - Renal failure  - Anemia  - Advanced Liver disease

## 2022-02-07 NOTE — Discharge Instructions (Signed)

## 2022-02-07 NOTE — Op Note (Addendum)
DATE OF SURGERY:  02/07/2022  TIME: 9:56 AM  PATIENT NAME:  Stacy Thomas    AGE: 61 y.o.    PRE-OPERATIVE DIAGNOSIS:  Osteoarthritis Right knee  POST-OPERATIVE DIAGNOSIS:  Osteoarthritis Right knee  PROCEDURE:  Procedure(s): RIGHT TOTAL KNEE ARTHROPLASTY  SURGEON: Meridee Score  ASSISTANT: Franz Dell  OPERATIVE IMPLANTS: Zimmer Persona  Femur size 7, Tibia size E  2- Peg fixed bearing, Patella size 32 1-peg oval button, with a 10 mm polyethylene insert medial congruent.  '@ENCIMAGES'$ @      PREOPERATIVE INDICATIONS:   [name] is a 61 y.o. year old female with end stage degenerative arthritis of the knee who failed conservative treatment and elected for Total Knee Arthroplasty.   The risks, benefits, and alternatives were discussed at length including but not limited to the risks of infection, bleeding, nerve injury, stiffness, blood clots, the need for revision surgery, cardiopulmonary complications, among others, and they were willing to proceed.  OPERATIVE DESCRIPTION:  The patient was brought to the operative room and placed in a supine position.  Anesthesia was administered.  IV antibiotics were given.  IV TXA was initiated.  The lower extremity was prepped and draped in the usual sterile fashion.  Charlie Pitter was used to cover all exposed skin. Time out was performed.    Anterior quadriceps tendon splitting approach was performed.  The patella was everted and osteophytes were removed.  The anterior horn of the medial and lateral meniscus was removed.   The distal femur was opened with the drill and the intramedullary distal femoral cutting jig was utilized, set at 5 degrees valgus resecting 9 mm off the distal femur.  Care was taken to protect the collateral ligaments.  Then the extramedullary tibial cutting jig was utilized set for 3 degree posterior slope.  Care was taken during the cut to protect the medial and collateral ligaments.  The proximal tibia was removed  along with the posterior horns of the menisci.  The PCL was sacrificed.    The extensor gap was measured and was approximately 10 mm.    The distal femoral sizing jig was applied, taking care to avoid notching.  Then the 4-in-1 cutting jig was applied and the anterior and posterior femur was cut, along with the chamfer cuts.  All posterior osteophytes were removed.  The flexion gap was then measured and was symmetric with the extension gap.  The distal femoral preparation using the appropriate jig to prepare the box.  The patella was then measured, and cut with the saw.    The proximal tibia sized and prepared accordingly with the reamer and the punch, and then all components were trialed with the poly insert.  The knee was found to have stable balance and full motion.  The knee was irrigated with normal saline, topical 2 g TXA was used to soak the wound.  The above named components were then press fit into place.  The final polyethylene component was placed.  The knee was then taken through a range of motion and the patella tracked well and the knee irrigated copiously and the parapatellar and subcutaneous tissue closed with vicryl, and skin closed with staples..  A sterile dressing was applied and patient  was taken to the PACU in stable  condition.  There were no complications.

## 2022-02-07 NOTE — Anesthesia Procedure Notes (Addendum)
Anesthesia Regional Block: Adductor canal block   Pre-Anesthetic Checklist: , timeout performed,  Correct Patient, Correct Site, Correct Laterality,  Correct Procedure, Correct Position, site marked,  Risks and benefits discussed,  Surgical consent,  Pre-op evaluation,  At surgeon's request and post-op pain management  Laterality: Right  Prep: chloraprep       Needles:  Injection technique: Single-shot  Needle Type: Echogenic Stimulator Needle     Needle Length: 5cm  Needle Gauge: 22     Additional Needles:   Procedures:,,,, ultrasound used (permanent image in chart),,    Narrative:  Start time: 02/07/2022 7:45 AM End time: 02/07/2022 7:50 AM Injection made incrementally with aspirations every 5 mL.  Performed by: Personally  Anesthesiologist: Janeece Riggers, MD  Additional Notes: Functioning IV was confirmed and monitors were applied.  A 10m 22ga Arrow echogenic stimulator needle was used. Sterile prep and drape,hand hygiene and sterile gloves were used. Ultrasound guidance: relevant anatomy identified, needle position confirmed, local anesthetic spread visualized around nerve(s)., vascular puncture avoided.  Image printed for medical record. Negative aspiration and negative test dose prior to incremental administration of local anesthetic. The patient tolerated the procedure well.

## 2022-02-07 NOTE — Anesthesia Postprocedure Evaluation (Signed)
Anesthesia Post Note  Patient: Stacy Thomas  Procedure(s) Performed: RIGHT TOTAL KNEE ARTHROPLASTY (Right: Knee)     Patient location during evaluation: PACU Anesthesia Type: Regional, MAC and Spinal Level of consciousness: awake and alert Pain management: pain level controlled Vital Signs Assessment: post-procedure vital signs reviewed and stable Respiratory status: spontaneous breathing, nonlabored ventilation, respiratory function stable and patient connected to nasal cannula oxygen Cardiovascular status: blood pressure returned to baseline and stable Postop Assessment: no apparent nausea or vomiting Anesthetic complications: no  No notable events documented.  Last Vitals:  Vitals:   02/07/22 1330 02/07/22 1400  BP: 106/83 (!) 122/90  Pulse: 61 67  Resp: 10 19  Temp:    SpO2: 92% 92%    Last Pain:  Vitals:   02/07/22 1400  TempSrc:   PainSc: Asleep                 Desman Polak

## 2022-02-08 ENCOUNTER — Other Ambulatory Visit: Payer: Self-pay

## 2022-02-08 ENCOUNTER — Encounter (HOSPITAL_COMMUNITY): Payer: Self-pay | Admitting: Orthopedic Surgery

## 2022-02-08 DIAGNOSIS — I129 Hypertensive chronic kidney disease with stage 1 through stage 4 chronic kidney disease, or unspecified chronic kidney disease: Secondary | ICD-10-CM | POA: Diagnosis not present

## 2022-02-08 DIAGNOSIS — N189 Chronic kidney disease, unspecified: Secondary | ICD-10-CM | POA: Diagnosis not present

## 2022-02-08 DIAGNOSIS — M1711 Unilateral primary osteoarthritis, right knee: Secondary | ICD-10-CM | POA: Diagnosis not present

## 2022-02-08 DIAGNOSIS — Z87891 Personal history of nicotine dependence: Secondary | ICD-10-CM | POA: Diagnosis not present

## 2022-02-08 DIAGNOSIS — Z89612 Acquired absence of left leg above knee: Secondary | ICD-10-CM | POA: Diagnosis not present

## 2022-02-08 MED ORDER — HYDROCODONE-ACETAMINOPHEN 5-325 MG PO TABS: 1.0000 | ORAL_TABLET | ORAL | 0 refills | Status: DC | PRN

## 2022-02-08 NOTE — Progress Notes (Signed)
Physical Therapy Treatment Patient Details Name: Stacy Thomas MRN: [id number] DOB: 10-10-1960 Today's Date: 02/08/2022   History of Present Illness Patient is 61 y.o. female s/p Rt TKA on 02/07/22 with PMH significant for cirrhosis, HLD, HTN, OSA, OA, Lt AKA in 1982.    PT Comments    Patient received in recliner and reports she was up again to bathroom. Session focused on HEP review for ROM and strengthening. Pt required tactile cues for correct muscle activation with exercises and assist for flexion ROM of Rt knee. Patient/spouse encouraged to reach out to surgeons office regarding follow up therapy for Rt TKA as pt is unsure of follow up plan. Will continue to progress in acute setting.     Recommendations for follow up therapy are one component of a multi-disciplinary discharge planning process, led by the attending physician.  Recommendations may be updated based on patient status, additional functional criteria and insurance authorization.  Follow Up Recommendations  Follow physician's recommendations for discharge plan and follow up therapies     Assistance Recommended at Discharge Frequent or constant Supervision/Assistance  Patient can return home with the following A little help with walking and/or transfers;A little help with bathing/dressing/bathroom;Assistance with cooking/housework;Direct supervision/assist for medications management;Assist for transportation;Help with stairs or ramp for entrance   Equipment Recommendations  Rolling walker (2 wheels);BSC/3in1    Recommendations for Other Services       Precautions / Restrictions Precautions Precautions: Fall Restrictions Weight Bearing Restrictions: No RLE Weight Bearing: Weight bearing as tolerated     Mobility  Bed Mobility               General bed mobility comments: OOB in recliner    Transfers Overall transfer level: Needs assistance Equipment used: None, Rolling walker (2 wheels) Transfers: Sit  to/from Stand Sit to Stand: Min guard           General transfer comment: pt declined transfer/gait, HEP reviewed in recliner.    Ambulation/Gait Ambulation/Gait assistance: Min guard, Supervision Gait Distance (Feet): 100 Feet Assistive device: Rolling walker (2 wheels) Gait Pattern/deviations: Step-through pattern, Wide base of support, Trunk flexed Gait velocity: fair     General Gait Details: cues for safe proximity to RW. pt with long step length bil, step through phase with poor awareness to keep walker close and often extending it ahead of self. cues for walker management improved gait pattern for short portion of ambulation. Pt's spouse present and provided safe guarding with cues and sueprvision from therapist.   Stairs Stairs:  (pt denies having stairs)           Wheelchair Mobility    Modified Rankin (Stroke Patients Only)       Balance Overall balance assessment: Needs assistance Sitting-balance support: Feet supported Sitting balance-Leahy Scale: Good     Standing balance support: Bilateral upper extremity supported, During functional activity, Reliant on assistive device for balance Standing balance-Leahy Scale: Poor                              Cognition Arousal/Alertness: Awake/alert Behavior During Therapy: Impulsive Overall Cognitive Status:  (family present but not indicating anything changed from baseline)                                 General Comments: pt is alert and oriented. slightly aggitated and eager to get home. pt impulsive and  not following cues/commands safely or consistently. resistent to education throughout.        Exercises Total Joint Exercises Ankle Circles/Pumps: AROM, Right, 10 reps, Seated Quad Sets: AROM, Right, 10 reps, Seated, Limitations Quad Sets Limitations: tactile cue at posterior knee Short Arc Quad: Limitations Short Arc Quad Limitations: attempted, pt unable to complete due to  pain Heel Slides: AAROM, Right, 10 reps, Seated, Limitations Heel Slides Limitations: belt and therapist for AAROM Hip ABduction/ADduction: AAROM, Right, 10 reps, Seated    General Comments        Pertinent Vitals/Pain Pain Assessment Pain Assessment: Faces Faces Pain Scale: Hurts little more Pain Location: Rt knee Pain Descriptors / Indicators: Aching Pain Intervention(s): Limited activity within patient's tolerance, Monitored during session, Repositioned, Ice applied    Home Living Family/patient expects to be discharged to:: Private residence Living Arrangements: Spouse/significant other Available Help at Discharge: Family Type of Home: House Home Access: Level entry       Home Layout: One level Home Equipment: Crutches Additional Comments: pt's spouse will assist her as needed    Prior Function            PT Goals (current goals can now be found in the care plan section) Acute Rehab PT Goals Patient Stated Goal: get home now PT Goal Formulation: With patient/family Time For Goal Achievement: 02/15/22 Potential to Achieve Goals: Good Progress towards PT goals: Progressing toward goals    Frequency    7X/week      PT Plan      Co-evaluation              AM-PAC PT "6 Clicks" Mobility   Outcome Measure  Help needed turning from your back to your side while in a flat bed without using bedrails?: A Little Help needed moving from lying on your back to sitting on the side of a flat bed without using bedrails?: A Little Help needed moving to and from a bed to a chair (including a wheelchair)?: A Little Help needed standing up from a chair using your arms (e.g., wheelchair or bedside chair)?: A Little Help needed to walk in hospital room?: A Little Help needed climbing 3-5 steps with a railing? : A Lot 6 Click Score: 17    End of Session Equipment Utilized During Treatment: Gait belt Activity Tolerance: Patient tolerated treatment well Patient left:  in chair;with call bell/phone within reach;with family/visitor present Nurse Communication: Mobility status PT Visit Diagnosis: Muscle weakness (generalized) (M62.81);Difficulty in walking, not elsewhere classified (R26.2);Other abnormalities of gait and mobility (R26.89);Unsteadiness on feet (R26.81)     Time: 8099-8338 PT Time Calculation (min) (ACUTE ONLY): 14 min  Charges:  $Therapeutic Exercise: 8-22 mins                     Verner Mould, DPT Acute Rehabilitation Services Office [phone]  02/08/22 11:53 AM

## 2022-02-08 NOTE — TOC Transition Note (Signed)
Transition of Care [facility]) - CM/SW Discharge Note   Patient Details  Name: Stacy Thomas MRN: [id number] Date of Birth: 1960/12/01  Transition of Care [facility]) CM/SW Contact:  Sharin Mons, RN Phone Number: [phone] 02/08/2022, 10:34 AM   Clinical Narrative:     Patient will DC to: home Anticipated DC date: 02/08/2022 Family notified: yes Transport by:car             S/p RIGHT TOTAL KNEE ARTHROPLASTY on 02/07/2022  Per MD patient ready for DC today. RN, patient,  and patient's husband aware of DC. Order noted for home health services and DME needs. Pt agreeable to home health services. Pt without provider services. Referral made with [facility] with [facility] and accepted. Referral made with Jermaine with [employer] for youth walker and 3in 1. Equipment will be delivered to bedside prior to d/c. Pt without Rx med concerns. Post hospital f/u noted on AVS.  Husband to provide transportation to home.  RNCM will sign off for now as intervention is no longer needed. Please consult [place] again if new needs arise.   Final next level of care: [place] Barriers to Discharge: No Barriers Identified   Patient Goals and CMS Choice     Choice offered to / list presented to : Patient  Discharge Placement                       Discharge Plan and Services                DME Arranged: [facility] youth, 3-N-1 DME Agency: [employer] Date DME Agency Contacted: 02/08/22 Time DME Agency Contacted: 1004 Representative spoke with at DME Agency: Brenton Grills HH Arranged: PT, Social Work [employer] Agency: [place] Date [place]: 02/08/22 Time [place]: 1033 Representative spoke with at [place]: [place] Determinants of Health ([place]) Interventions     Readmission Risk Interventions     No data to display

## 2022-02-08 NOTE — Evaluation (Signed)
Physical Therapy Evaluation Patient Details Name: Stacy Thomas MRN: [id number] DOB: Feb 28, 1961 Today's Date: 02/08/2022  History of Present Illness  Patient is 61 y.o. female s/p Rt TKA on 02/07/22 with PMH significant for cirrhosis, HLD, HTN, OSA, OA, Lt AKA in 1982.   Clinical Impression  Stacy Thomas is a 61 y.o. female POD 1 s/p Rt TKA. Patient reports independence with mobility at baseline. Patient is now limited by functional impairments (see PT problem list below) and requires min guard for transfers and gait with RW. Patient was able to ambulate ~100 feet with RW and min guard/assist from spouse and supervision with cues for safety from therapist. Patient will benefit from continued skilled PT interventions to address impairments and progress towards PLOF. Acute PT will follow to progress mobility in preparation for safe discharge home.        Recommendations for follow up therapy are one component of a multi-disciplinary discharge planning process, led by the attending physician.  Recommendations may be updated based on patient status, additional functional criteria and insurance authorization.  Follow Up Recommendations Follow physician's recommendations for discharge plan and follow up therapies      Assistance Recommended at Discharge Frequent or constant Supervision/Assistance  Patient can return home with the following  A little help with walking and/or transfers;A little help with bathing/dressing/bathroom;Assistance with cooking/housework;Direct supervision/assist for medications management;Assist for transportation;Help with stairs or ramp for entrance    Equipment Recommendations Rolling walker (2 wheels);BSC/3in1  Recommendations for Other Services       Functional Status Assessment Patient has had a recent decline in their functional status and demonstrates the ability to make significant improvements in function in a reasonable and predictable amount of time.      Precautions / Restrictions Precautions Precautions: Fall Restrictions Weight Bearing Restrictions: No RLE Weight Bearing: Weight bearing as tolerated      Mobility  Bed Mobility               General bed mobility comments: OOB in recliner    Transfers Overall transfer level: Needs assistance Equipment used: None, Rolling walker (2 wheels) Transfers: Sit to/from Stand Sit to Stand: Min guard           General transfer comment: patient impulsive to stand with gait belt around Rt foot as she was using belt to control lowering Rt LE to floor and was resistant to cues from therapist to release belt prior to stand. pt educated on risk of falling with belt around foot when walking as well as risk of walking without RW. pt returned to sitting and belt donned and walker positioned.    Ambulation/Gait Ambulation/Gait assistance: Min guard, Supervision Gait Distance (Feet): 100 Feet Assistive device: Rolling walker (2 wheels) Gait Pattern/deviations: Step-through pattern, Wide base of support, Trunk flexed Gait velocity: fair     General Gait Details: cues for safe proximity to RW. pt with long step length bil, step through phase with poor awareness to keep walker close and often extending it ahead of self. cues for walker management improved gait pattern for short portion of ambulation. Pt's spouse present and provided safe guarding with cues and sueprvision from therapist.  Stairs Stairs:  (pt denies having stairs)          Wheelchair Mobility    Modified Rankin (Stroke Patients Only)       Balance Overall balance assessment: Needs assistance Sitting-balance support: Feet supported Sitting balance-Leahy Scale: Good     Standing balance support: Bilateral  upper extremity supported, During functional activity, Reliant on assistive device for balance Standing balance-Leahy Scale: Poor                               Pertinent Vitals/Pain Pain  Assessment Pain Assessment: Faces Faces Pain Scale: Hurts little more Pain Location: Rt knee Pain Descriptors / Indicators: Aching Pain Intervention(s): Limited activity within patient's tolerance, Monitored during session, Ice applied    Home Living Family/patient expects to be discharged to:: Private residence Living Arrangements: Spouse/significant other Available Help at Discharge: Family Type of Home: House Home Access: Level entry       Home Layout: One level Home Equipment: Crutches Additional Comments: pt's spouse will assist her as needed    Prior Function Prior Level of Function : Independent/Modified Independent                     Hand Dominance        Extremity/Trunk Assessment   Upper Extremity Assessment Upper Extremity Assessment: Overall WFL for tasks assessed    Lower Extremity Assessment Lower Extremity Assessment: LLE deficits/detail;RLE deficits/detail RLE Deficits / Details: hx of Rt AKA, prosthesis in place - pt independent with donning/doffing LLE Deficits / Details: no buckling noted in standing or during gait with RW    Cervical / Trunk Assessment Cervical / Trunk Assessment: Normal  Communication   Communication: No difficulties  Cognition Arousal/Alertness: Awake/alert Behavior During Therapy: Impulsive Overall Cognitive Status:  (family present but not indicating anything changed from baseline)                                 General Comments: pt is alert and oriented. slightly aggitated and eager to get home. pt impulsive and not following cues/commands safely or consistently. resistent to education throughout.        General Comments      Exercises     Assessment/Plan    PT Assessment Patient needs continued PT services  PT Problem List Decreased strength;Decreased range of motion;Decreased activity tolerance;Decreased balance;Decreased mobility;Decreased cognition;Decreased knowledge of use of  DME;Decreased safety awareness;Decreased knowledge of precautions;Obesity       PT Treatment Interventions DME instruction;Gait training;Stair training;Functional mobility training;Therapeutic activities;Therapeutic exercise;Balance training;Cognitive remediation;Patient/family education    PT Goals (Current goals can be found in the Care Plan section)  Acute Rehab PT Goals Patient Stated Goal: get home now PT Goal Formulation: With patient/family Time For Goal Achievement: 02/15/22 Potential to Achieve Goals: Good    Frequency 7X/week     Co-evaluation               AM-PAC PT "6 Clicks" Mobility  Outcome Measure Help needed turning from your back to your side while in a flat bed without using bedrails?: A Little Help needed moving from lying on your back to sitting on the side of a flat bed without using bedrails?: A Little Help needed moving to and from a bed to a chair (including a wheelchair)?: A Little Help needed standing up from a chair using your arms (e.g., wheelchair or bedside chair)?: A Little Help needed to walk in hospital room?: A Little Help needed climbing 3-5 steps with a railing? : A Lot 6 Click Score: 17    End of Session Equipment Utilized During Treatment: Gait belt Activity Tolerance: Patient tolerated treatment well Patient left: in chair;with call bell/phone  within reach;with family/visitor present Nurse Communication: Mobility status PT Visit Diagnosis: Muscle weakness (generalized) (M62.81);Difficulty in walking, not elsewhere classified (R26.2);Other abnormalities of gait and mobility (R26.89);Unsteadiness on feet (R26.81)    Time: 1499-6924 PT Time Calculation (min) (ACUTE ONLY): 15 min   Charges:   PT Evaluation $PT Eval Moderate Complexity: 1 Mod          Verner Mould, DPT Acute Rehabilitation Services Office [phone]  02/08/22 11:49 AM

## 2022-02-08 NOTE — Discharge Summary (Signed)
Discharge Diagnoses:  Principal Problem:   Total knee replacement status, right   Surgeries: Procedure(s): RIGHT TOTAL KNEE ARTHROPLASTY on 02/07/2022    Consultants:   Discharged Condition: Improved  Hospital Course: Stacy Thomas is an 61 y.o. female who was admitted 02/07/2022 with a chief complaint of osteoarthritis right knee, with a final diagnosis of Osteoarthritis Right knee.  Patient was brought to the operating room on 02/07/2022 and underwent Procedure(s): RIGHT TOTAL KNEE ARTHROPLASTY.    Patient was given perioperative antibiotics:  Anti-infectives (From admission, onward)    Start     Dose/Rate Route Frequency Ordered Stop   02/07/22 1815  ceFAZolin (ANCEF) IVPB 1 g/50 mL premix        1 g 100 mL/hr over 30 Minutes Intravenous Every 6 hours 02/07/22 1729 02/08/22 0220   02/07/22 0700  ceFAZolin (ANCEF) IVPB 2g/100 mL premix        2 g 200 mL/hr over 30 Minutes Intravenous On call to O.R. 02/07/22 0814 02/07/22 0839     .  Patient was given sequential compression devices, early ambulation, and aspirin for DVT prophylaxis.  Recent vital signs: Patient Vitals for the past 24 hrs:  BP Temp Temp src Pulse Resp SpO2  02/08/22 0735 120/70 98.4 F (36.9 C) -- (!) 101 16 99 %  02/07/22 1958 129/88 98.2 F (36.8 C) Oral 72 17 94 %  02/07/22 1600 (!) 120/90 (!) 97.4 F (36.3 C) -- 67 10 96 %  02/07/22 1530 119/88 -- -- 70 (!) 24 97 %  02/07/22 1500 116/79 -- -- 68 11 96 %  02/07/22 1430 123/83 -- -- 67 12 96 %  02/07/22 1400 (!) 122/90 -- -- 67 19 92 %  02/07/22 1330 106/83 -- -- 61 10 92 %  02/07/22 1300 115/80 -- -- 61 10 92 %  02/07/22 1230 107/74 -- -- 60 10 97 %  02/07/22 1215 106/79 -- -- 62 13 97 %  02/07/22 1200 102/80 -- -- (!) 59 10 97 %  02/07/22 1145 100/80 -- -- (!) 59 11 98 %  02/07/22 1130 100/72 -- -- (!) 58 12 97 %  02/07/22 1115 91/73 -- -- (!) 58 12 97 %  02/07/22 1100 100/70 -- -- 65 17 97 %  02/07/22 1045 96/68 -- -- 62 11 95 %  02/07/22  1030 97/65 -- -- 61 13 95 %  02/07/22 1015 106/71 -- -- 71 17 99 %  02/07/22 1002 101/68 (!) 97.4 F (36.3 C) -- 77 14 97 %  02/07/22 0824 -- -- -- 69 -- 93 %  02/07/22 0823 -- -- -- 67 13 94 %  02/07/22 0822 -- -- -- 69 16 94 %  02/07/22 0821 -- -- -- 68 20 94 %  02/07/22 0820 128/72 -- -- 70 15 94 %  02/07/22 0819 -- -- -- 70 12 94 %  02/07/22 0818 -- -- -- 72 18 94 %  02/07/22 0817 -- -- -- 72 10 94 %  02/07/22 0816 -- -- -- 72 17 94 %  02/07/22 0815 105/83 -- -- 72 18 94 %  02/07/22 0814 -- -- -- 70 11 93 %  02/07/22 0813 -- -- -- 71 13 92 %  02/07/22 0812 -- -- -- 71 10 92 %  02/07/22 0811 -- -- -- 70 11 92 %  .  Recent laboratory studies: No results found.  Discharge Medications:   Allergies as of 02/08/2022       Reactions  Morphine Swelling   REACTION: face swells   Codeine Other (See Comments)   Avoids due to being recovery addict        Medication List     TAKE these medications    atorvastatin 40 MG tablet Commonly known as: LIPITOR Take 1 tablet (40 mg total) by mouth daily.   HYDROcodone-acetaminophen 5-325 MG tablet Commonly known as: NORCO/VICODIN Take 1 tablet by mouth every 4 (four) hours as needed. What changed: when to take this   lisinopril 20 MG tablet Commonly known as: ZESTRIL Take 1 tablet (20 mg total) by mouth daily.   lisinopril-hydrochlorothiazide 20-25 MG tablet Commonly known as: ZESTORETIC Take 1 tablet by mouth daily.   Prevagen 10 MG Caps Generic drug: Apoaequorin Take 10 mg by mouth daily.               Discharge Care Instructions  (From admission, onward)           Start     Ordered   02/08/22 0000  Weight bearing as tolerated       Question Answer Comment  Laterality right   Extremity Lower      02/08/22 0810            Diagnostic Studies: No results found.  Patient benefited maximally from their hospital stay and there were no complications.     Disposition: Discharge disposition:  01-Home or Self Care      Discharge Instructions     Call MD / Call 911   Complete by: As directed    If you experience chest pain or shortness of breath, CALL 911 and be transported to the hospital emergency room.  If you develope a fever above 101 F, pus (white drainage) or increased drainage or redness at the wound, or calf pain, call your surgeon's office.   Constipation Prevention   Complete by: As directed    Drink plenty of fluids.  Prune juice may be helpful.  You may use a stool softener, such as Colace (over the counter) 100 mg twice a day.  Use MiraLax (over the counter) for constipation as needed.   Diet - low sodium heart healthy   Complete by: As directed    Increase activity slowly as tolerated   Complete by: As directed    Post-operative opioid taper instructions:   Complete by: As directed    POST-OPERATIVE OPIOID TAPER INSTRUCTIONS: It is important to wean off of your opioid medication as soon as possible. If you do not need pain medication after your surgery it is ok to stop day one. Opioids include: Codeine, Hydrocodone(Norco, Vicodin), Oxycodone(Percocet, oxycontin) and hydromorphone amongst others.  Long term and even short term use of opiods can cause: Increased pain response Dependence Constipation Depression Respiratory depression And more.  Withdrawal symptoms can include Flu like symptoms Nausea, vomiting And more Techniques to manage these symptoms Hydrate well Eat regular healthy meals Stay active Use relaxation techniques(deep breathing, meditating, yoga) Do Not substitute Alcohol to help with tapering If you have been on opioids for less than two weeks and do not have pain than it is ok to stop all together.  Plan to wean off of opioids This plan should start within one week post op of your joint replacement. Maintain the same interval or time between taking each dose and first decrease the dose.  Cut the total daily intake of opioids by one  tablet each day Next start to increase the time between doses. The last  dose that should be eliminated is the evening dose.      Weight bearing as tolerated   Complete by: As directed    Laterality: right   Extremity: Lower       Follow-up Information     Newt Minion, MD Follow up in 1 week(s).   Specialty: Orthopedic Surgery Contact information: [address] [place] [address] [phone]                  Signed: Newt Minion 02/08/2022, 8:10 AM

## 2022-02-09 ENCOUNTER — Telehealth: Payer: Self-pay

## 2022-02-09 NOTE — Telephone Encounter (Signed)
Patient called worried stating one of her staples fell out, I told her if it is just one and she isn't having any bleeding or draining it is ok. I told her to cover it up and keep it covered, elevate and ice the leg- trying to get her swelling down will help control her pain. She verbalized understanding

## 2022-02-10 DIAGNOSIS — Z471 Aftercare following joint replacement surgery: Secondary | ICD-10-CM | POA: Diagnosis not present

## 2022-02-12 ENCOUNTER — Telehealth: Payer: Self-pay | Admitting: Orthopedic Surgery

## 2022-02-12 DIAGNOSIS — Z471 Aftercare following joint replacement surgery: Secondary | ICD-10-CM | POA: Diagnosis not present

## 2022-02-12 NOTE — Telephone Encounter (Signed)
Patient called in stating her PT nurse told her that she was supposed to come in this wednesday to get her stitches removed she had a Total knee on 11/08 and no Post op appt was set please advise on when she is supposed to come in.

## 2022-02-12 NOTE — Telephone Encounter (Signed)
Can you please call the pt she would have her staples removed 2 weeks post op which would be 02/21/2022. Can you please call and make an appt for her with Junie Panning or Sharol Given please.

## 2022-02-13 ENCOUNTER — Other Ambulatory Visit: Payer: Self-pay | Admitting: Orthopedic Surgery

## 2022-02-13 ENCOUNTER — Telehealth: Payer: Self-pay | Admitting: Orthopedic Surgery

## 2022-02-13 MED ORDER — OXYCODONE-ACETAMINOPHEN 5-325 MG PO TABS: 1.0000 | ORAL_TABLET | Freq: Three times a day (TID) | ORAL | 0 refills | Status: DC | PRN

## 2022-02-13 NOTE — Telephone Encounter (Signed)
Pt informed

## 2022-02-13 NOTE — Telephone Encounter (Signed)
Patient called advised the Hydrocodone is not working. Patient asked if she can get something a little stronger to help with the pain? The number to contact patient is [phone]

## 2022-02-13 NOTE — Telephone Encounter (Signed)
S/p 02/07/22 right TKA

## 2022-02-14 DIAGNOSIS — G6289 Other specified polyneuropathies: Secondary | ICD-10-CM | POA: Diagnosis not present

## 2022-02-14 DIAGNOSIS — Z85830 Personal history of malignant neoplasm of bone: Secondary | ICD-10-CM | POA: Diagnosis not present

## 2022-02-14 DIAGNOSIS — N189 Chronic kidney disease, unspecified: Secondary | ICD-10-CM | POA: Diagnosis not present

## 2022-02-14 DIAGNOSIS — E559 Vitamin D deficiency, unspecified: Secondary | ICD-10-CM | POA: Diagnosis not present

## 2022-02-14 DIAGNOSIS — Z89612 Acquired absence of left leg above knee: Secondary | ICD-10-CM | POA: Diagnosis not present

## 2022-02-14 DIAGNOSIS — G5603 Carpal tunnel syndrome, bilateral upper limbs: Secondary | ICD-10-CM | POA: Diagnosis not present

## 2022-02-14 DIAGNOSIS — I739 Peripheral vascular disease, unspecified: Secondary | ICD-10-CM | POA: Diagnosis not present

## 2022-02-14 DIAGNOSIS — Z87891 Personal history of nicotine dependence: Secondary | ICD-10-CM | POA: Diagnosis not present

## 2022-02-14 DIAGNOSIS — B182 Chronic viral hepatitis C: Secondary | ICD-10-CM | POA: Diagnosis not present

## 2022-02-14 DIAGNOSIS — M160 Bilateral primary osteoarthritis of hip: Secondary | ICD-10-CM | POA: Diagnosis not present

## 2022-02-14 DIAGNOSIS — Z471 Aftercare following joint replacement surgery: Secondary | ICD-10-CM | POA: Diagnosis not present

## 2022-02-14 DIAGNOSIS — I129 Hypertensive chronic kidney disease with stage 1 through stage 4 chronic kidney disease, or unspecified chronic kidney disease: Secondary | ICD-10-CM | POA: Diagnosis not present

## 2022-02-14 DIAGNOSIS — E785 Hyperlipidemia, unspecified: Secondary | ICD-10-CM | POA: Diagnosis not present

## 2022-02-14 DIAGNOSIS — Z96651 Presence of right artificial knee joint: Secondary | ICD-10-CM | POA: Diagnosis not present

## 2022-02-14 DIAGNOSIS — G4733 Obstructive sleep apnea (adult) (pediatric): Secondary | ICD-10-CM | POA: Diagnosis not present

## 2022-02-14 DIAGNOSIS — J309 Allergic rhinitis, unspecified: Secondary | ICD-10-CM | POA: Diagnosis not present

## 2022-02-16 DIAGNOSIS — E785 Hyperlipidemia, unspecified: Secondary | ICD-10-CM | POA: Diagnosis not present

## 2022-02-16 DIAGNOSIS — M160 Bilateral primary osteoarthritis of hip: Secondary | ICD-10-CM | POA: Diagnosis not present

## 2022-02-16 DIAGNOSIS — G6289 Other specified polyneuropathies: Secondary | ICD-10-CM | POA: Diagnosis not present

## 2022-02-16 DIAGNOSIS — E559 Vitamin D deficiency, unspecified: Secondary | ICD-10-CM | POA: Diagnosis not present

## 2022-02-16 DIAGNOSIS — Z89612 Acquired absence of left leg above knee: Secondary | ICD-10-CM | POA: Diagnosis not present

## 2022-02-16 DIAGNOSIS — N189 Chronic kidney disease, unspecified: Secondary | ICD-10-CM | POA: Diagnosis not present

## 2022-02-16 DIAGNOSIS — I739 Peripheral vascular disease, unspecified: Secondary | ICD-10-CM | POA: Diagnosis not present

## 2022-02-16 DIAGNOSIS — G4733 Obstructive sleep apnea (adult) (pediatric): Secondary | ICD-10-CM | POA: Diagnosis not present

## 2022-02-16 DIAGNOSIS — G5603 Carpal tunnel syndrome, bilateral upper limbs: Secondary | ICD-10-CM | POA: Diagnosis not present

## 2022-02-16 DIAGNOSIS — Z85830 Personal history of malignant neoplasm of bone: Secondary | ICD-10-CM | POA: Diagnosis not present

## 2022-02-16 DIAGNOSIS — Z87891 Personal history of nicotine dependence: Secondary | ICD-10-CM | POA: Diagnosis not present

## 2022-02-16 DIAGNOSIS — I129 Hypertensive chronic kidney disease with stage 1 through stage 4 chronic kidney disease, or unspecified chronic kidney disease: Secondary | ICD-10-CM | POA: Diagnosis not present

## 2022-02-16 DIAGNOSIS — J309 Allergic rhinitis, unspecified: Secondary | ICD-10-CM | POA: Diagnosis not present

## 2022-02-16 DIAGNOSIS — Z96651 Presence of right artificial knee joint: Secondary | ICD-10-CM | POA: Diagnosis not present

## 2022-02-16 DIAGNOSIS — B182 Chronic viral hepatitis C: Secondary | ICD-10-CM | POA: Diagnosis not present

## 2022-02-16 DIAGNOSIS — Z471 Aftercare following joint replacement surgery: Secondary | ICD-10-CM | POA: Diagnosis not present

## 2022-02-19 ENCOUNTER — Ambulatory Visit (INDEPENDENT_AMBULATORY_CARE_PROVIDER_SITE_OTHER): Payer: Self-pay

## 2022-02-19 ENCOUNTER — Encounter: Payer: Self-pay | Admitting: Orthopedic Surgery

## 2022-02-19 ENCOUNTER — Ambulatory Visit (INDEPENDENT_AMBULATORY_CARE_PROVIDER_SITE_OTHER): Payer: BC Managed Care – PPO | Admitting: Orthopedic Surgery

## 2022-02-19 VITALS — Ht 60.0 in | Wt 149.0 lb

## 2022-02-19 DIAGNOSIS — J309 Allergic rhinitis, unspecified: Secondary | ICD-10-CM | POA: Diagnosis not present

## 2022-02-19 DIAGNOSIS — E785 Hyperlipidemia, unspecified: Secondary | ICD-10-CM | POA: Diagnosis not present

## 2022-02-19 DIAGNOSIS — Z96651 Presence of right artificial knee joint: Secondary | ICD-10-CM

## 2022-02-19 DIAGNOSIS — E559 Vitamin D deficiency, unspecified: Secondary | ICD-10-CM | POA: Diagnosis not present

## 2022-02-19 DIAGNOSIS — I129 Hypertensive chronic kidney disease with stage 1 through stage 4 chronic kidney disease, or unspecified chronic kidney disease: Secondary | ICD-10-CM | POA: Diagnosis not present

## 2022-02-19 DIAGNOSIS — G5603 Carpal tunnel syndrome, bilateral upper limbs: Secondary | ICD-10-CM | POA: Diagnosis not present

## 2022-02-19 DIAGNOSIS — I739 Peripheral vascular disease, unspecified: Secondary | ICD-10-CM | POA: Diagnosis not present

## 2022-02-19 DIAGNOSIS — Z471 Aftercare following joint replacement surgery: Secondary | ICD-10-CM | POA: Diagnosis not present

## 2022-02-19 DIAGNOSIS — B182 Chronic viral hepatitis C: Secondary | ICD-10-CM | POA: Diagnosis not present

## 2022-02-19 DIAGNOSIS — Z87891 Personal history of nicotine dependence: Secondary | ICD-10-CM | POA: Diagnosis not present

## 2022-02-19 DIAGNOSIS — M1711 Unilateral primary osteoarthritis, right knee: Secondary | ICD-10-CM

## 2022-02-19 DIAGNOSIS — G4733 Obstructive sleep apnea (adult) (pediatric): Secondary | ICD-10-CM | POA: Diagnosis not present

## 2022-02-19 DIAGNOSIS — M160 Bilateral primary osteoarthritis of hip: Secondary | ICD-10-CM | POA: Diagnosis not present

## 2022-02-19 DIAGNOSIS — Z89612 Acquired absence of left leg above knee: Secondary | ICD-10-CM | POA: Diagnosis not present

## 2022-02-19 DIAGNOSIS — G6289 Other specified polyneuropathies: Secondary | ICD-10-CM | POA: Diagnosis not present

## 2022-02-19 DIAGNOSIS — N189 Chronic kidney disease, unspecified: Secondary | ICD-10-CM | POA: Diagnosis not present

## 2022-02-19 DIAGNOSIS — Z85830 Personal history of malignant neoplasm of bone: Secondary | ICD-10-CM | POA: Diagnosis not present

## 2022-02-19 MED ORDER — OXYCODONE-ACETAMINOPHEN 5-325 MG PO TABS: 1.0000 | ORAL_TABLET | Freq: Three times a day (TID) | ORAL | 0 refills | Status: DC | PRN

## 2022-02-19 NOTE — Progress Notes (Signed)
Office Visit Note   Patient: Stacy Thomas           Date of Birth: 11/03/1960           MRN: [id number] Visit Date: 02/19/2022              Requested by: Sanjuan Dame, MD [address] [place],  [place] [address] PCP: Sanjuan Dame, MD  Chief Complaint  Patient presents with   Right Knee - Routine Post Op    02/07/2022 Right TKA      HPI: Patient is a 61 year old woman who is 2 weeks status post right total knee arthroplasty.  She states she is doing well still requiring pain medication.  Assessment & Plan: Visit Diagnoses:  1. Status post total right knee replacement   2. Unilateral primary osteoarthritis, right knee     Plan: Patient will continue with her home health physical therapy she was given instructions to continue working on knee extension.  Instructions for scar massage.  Follow-Up Instructions: Return in about 2 weeks (around 03/05/2022).   Ortho Exam  Patient is alert, oriented, no adenopathy, well-dressed, normal affect, normal respiratory effort. Examination the incision is well-healed.  Patient has no redness cellulitis or drainage.  She lacks about 10 degrees to full extension.  Imaging: XR Knee 1-2 Views Right  Result Date: 02/19/2022 2 view radiographs of the right knee shows a stable total knee arthroplasty without loosening.  No images are attached to the encounter.  Labs: Lab Results  Component Value Date   HGBA1C 5.8 (H) 12/05/2021   HGBA1C 5.9 (H) 02/20/2021   HGBA1C 5.9 (H) 02/29/2020     Lab Results  Component Value Date   ALBUMIN 3.8 01/29/2022   ALBUMIN 4.6 12/05/2021   ALBUMIN 4.5 11/10/2021    No results found for: "MG" Lab Results  Component Value Date   VD25OH 15.8 (L) 05/03/2016    No results found for: "PREALBUMIN"    Latest Ref Rng & Units 01/29/2022    8:47 AM 12/05/2021    9:56 AM 11/10/2021    9:39 AM  CBC EXTENDED  WBC 4.0 - 10.5 K/uL 4.1  4.2  4.1   RBC 3.87 - 5.11 MIL/uL 4.56  4.53  4.43    Hemoglobin 12.0 - 15.0 g/dL 13.7  13.5  13.2   HCT 36.0 - 46.0 % 40.8  39.8  38.6   Platelets 150 - 400 K/uL 215  210  225.0   NEUT# 1.4 - 7.0 x10E3/uL  1.8  1.4   Lymph# 0.7 - 3.1 x10E3/uL  1.9  2.2      Body mass index is 29.1 kg/m.  Orders:  Orders Placed This Encounter  Procedures   XR Knee 1-2 Views Right   No orders of the defined types were placed in this encounter.    Procedures: No procedures performed  Clinical Data: No additional findings.  ROS:  All other systems negative, except as noted in the HPI. Review of Systems  Objective: Vital Signs: Ht 5' (1.524 m)   Wt 149 lb (67.6 kg)   LMP 12/02/2010   BMI 29.10 kg/m   Specialty Comments:  No specialty comments available.  PMFS History: Patient Active Problem List   Diagnosis Date Noted   Total knee replacement status, right 02/07/2022   Malaise and fatigue 12/05/2021   Prediabetes 02/23/2021   Hair loss 02/23/2021   Memory changes 03/01/2020   CKD (chronic kidney disease) 09/11/2019   HLD (hyperlipidemia) 09/09/2018  Allergic sinusitis 12/03/2017   Hx of AKA (above knee amputation), left ([facility]) 02/18/2017   Unilateral primary osteoarthritis, right knee 02/18/2017   Vitamin D deficiency 05/04/2016   Bilateral primary osteoarthritis of hip 04/26/2016   Bilateral carpal tunnel syndrome 03/29/2016   Screening for cervical cancer 03/06/2016   PVD (peripheral vascular disease) ([facility]) 03/05/2016   Pancreatic abnormality 03/05/2016   Hypertension 03/05/2016   Hepatic cirrhosis ([facility]) 11/15/2015   Chronic hepatitis C ([facility]) 06/15/2015   Prolapsed internal hemorrhoids, grade 4 06/14/2015   Hx of osteosarcoma 06/14/2015   Past Medical History:  Diagnosis Date   Bilateral arm pain    chronic, due to use of crutches   Dependence on crutches    left leg prosthesis   Drug addiction in remission ([facility])    since 2007  (crack cocaine,  IV drug use)   Hand weakness    Hepatic cirrhosis ([facility]) 2018    followed by dr Havery Moros ([facility]) due to hx chronic hepatitis c   Hiatal hernia    History of esophageal stricture    post dilation x2   History of hepatitis C    pt completed harvoni treatment 2017, cured;  genotype 1a with fibrosis 3/4,  previously seen by dr comer   History of osteosarcoma    1982  left knee---  s/p  AKA LLE--   per pt no recurrence   History of recreational drug use multiple rehab visits   IV drug use for 1 yr and Smoked crack for 30+yrs--  per pt clean since 2007   Hyperlipidemia    Hypertension    Idiopathic peripheral neuropathy    OSA (obstructive sleep apnea)    study in epic 10-27-2018 by dr Rexene Alberts,  moderate osa,  pt stated used cpap until approx. 07/ 2021, intolerate of mask   Osteoarthritis    Pancreatic cyst    followed by dr Havery Moros--- per office note bening and stable   Sigmoid diverticulosis    mild   Wears glasses     Family History  Problem Relation Age of Onset   COPD Father    Alcohol abuse Father        Deceased   Hypertension Mother        Living   Healthy Daughter    Healthy Son    Anesthesia problems Neg Hx    Breast cancer Neg Hx    Colon cancer Neg Hx    Esophageal cancer Neg Hx    Rectal cancer Neg Hx    Stomach cancer Neg Hx    Colon polyps Neg Hx     Past Surgical History:  Procedure Laterality Date   ABOVE KNEE LEG AMPUTATION Left 1982   osteosarcoma   CARPAL TUNNEL RELEASE Right 10/18/2021   Procedure: RIGHT CARPAL TUNNEL RELEASE;  Surgeon: Leandrew Koyanagi, MD;  Location: [facility];  Service: Orthopedics;  Laterality: Right;   COLONOSCOPY WITH PROPOFOL  last one 04-28-2020  dr Havery Moros   ESOPHAGOGASTRODUODENOSCOPY (EGD) WITH PROPOFOL  last one 11-02-2019  dr Havery Moros   EXCISION BREAST BX  2015   CYST   EXCISION OF SKIN TAG N/A 07/14/2020   Procedure: EXCISION OF ANAL PAPILLA;  Surgeon: Leighton Ruff, MD;  [facility];  Service: General;  Laterality: N/A;   STERIOD INJECTION  Left 10/18/2021   Procedure: LEFT CARPAL TUNNEL INJECTION;  Surgeon: Leandrew Koyanagi, MD;  Location: [facility];  Service: Orthopedics;  Laterality: Left;  TONSILLECTOMY  as child   TOTAL KNEE ARTHROPLASTY Right 02/07/2022   Procedure: RIGHT TOTAL KNEE ARTHROPLASTY;  Surgeon: Newt Minion, MD;  Location: [facility];  Service: Orthopedics;  Laterality: Right;   TRANSANAL HEMORRHOIDAL DEARTERIALIZATION N/A 10/27/2015   Procedure: TRANSANAL HEMORRHOIDAL DEARTERIALIZATION;  Surgeon: Michael Boston, MD;  Location: [facility];  Service: General;  Laterality: N/A;   UPPER GASTROINTESTINAL ENDOSCOPY     Social History   Occupational History   Not on file  Tobacco Use   Smoking status: Former    Packs/day: 1.00    Years: 37.00    Total pack years: 37.00    Types: Cigarettes    Start date: 04/02/1973    Quit date: 12/19/2014    Years since quitting: 7.1   Smokeless tobacco: Never  Vaping Use   Vaping Use: Never used  Substance and Sexual Activity   Alcohol use: Never    Alcohol/week: 0.0 standard drinks of alcohol   Drug use: Not Currently    Types: "Crack" cocaine    Comment: recovery addict since 2007   Sexual activity: Not on file

## 2022-02-21 DIAGNOSIS — E785 Hyperlipidemia, unspecified: Secondary | ICD-10-CM | POA: Diagnosis not present

## 2022-02-21 DIAGNOSIS — Z89612 Acquired absence of left leg above knee: Secondary | ICD-10-CM | POA: Diagnosis not present

## 2022-02-21 DIAGNOSIS — Z96651 Presence of right artificial knee joint: Secondary | ICD-10-CM | POA: Diagnosis not present

## 2022-02-21 DIAGNOSIS — Z87891 Personal history of nicotine dependence: Secondary | ICD-10-CM | POA: Diagnosis not present

## 2022-02-21 DIAGNOSIS — N189 Chronic kidney disease, unspecified: Secondary | ICD-10-CM | POA: Diagnosis not present

## 2022-02-21 DIAGNOSIS — I129 Hypertensive chronic kidney disease with stage 1 through stage 4 chronic kidney disease, or unspecified chronic kidney disease: Secondary | ICD-10-CM | POA: Diagnosis not present

## 2022-02-21 DIAGNOSIS — Z85830 Personal history of malignant neoplasm of bone: Secondary | ICD-10-CM | POA: Diagnosis not present

## 2022-02-21 DIAGNOSIS — G4733 Obstructive sleep apnea (adult) (pediatric): Secondary | ICD-10-CM | POA: Diagnosis not present

## 2022-02-21 DIAGNOSIS — G5603 Carpal tunnel syndrome, bilateral upper limbs: Secondary | ICD-10-CM | POA: Diagnosis not present

## 2022-02-21 DIAGNOSIS — G6289 Other specified polyneuropathies: Secondary | ICD-10-CM | POA: Diagnosis not present

## 2022-02-21 DIAGNOSIS — B182 Chronic viral hepatitis C: Secondary | ICD-10-CM | POA: Diagnosis not present

## 2022-02-21 DIAGNOSIS — M160 Bilateral primary osteoarthritis of hip: Secondary | ICD-10-CM | POA: Diagnosis not present

## 2022-02-21 DIAGNOSIS — Z471 Aftercare following joint replacement surgery: Secondary | ICD-10-CM | POA: Diagnosis not present

## 2022-02-21 DIAGNOSIS — I739 Peripheral vascular disease, unspecified: Secondary | ICD-10-CM | POA: Diagnosis not present

## 2022-02-21 DIAGNOSIS — J309 Allergic rhinitis, unspecified: Secondary | ICD-10-CM | POA: Diagnosis not present

## 2022-02-21 DIAGNOSIS — E559 Vitamin D deficiency, unspecified: Secondary | ICD-10-CM | POA: Diagnosis not present

## 2022-02-23 DIAGNOSIS — E785 Hyperlipidemia, unspecified: Secondary | ICD-10-CM | POA: Diagnosis not present

## 2022-02-23 DIAGNOSIS — M160 Bilateral primary osteoarthritis of hip: Secondary | ICD-10-CM | POA: Diagnosis not present

## 2022-02-23 DIAGNOSIS — Z85830 Personal history of malignant neoplasm of bone: Secondary | ICD-10-CM | POA: Diagnosis not present

## 2022-02-23 DIAGNOSIS — Z96651 Presence of right artificial knee joint: Secondary | ICD-10-CM | POA: Diagnosis not present

## 2022-02-23 DIAGNOSIS — I739 Peripheral vascular disease, unspecified: Secondary | ICD-10-CM | POA: Diagnosis not present

## 2022-02-23 DIAGNOSIS — G6289 Other specified polyneuropathies: Secondary | ICD-10-CM | POA: Diagnosis not present

## 2022-02-23 DIAGNOSIS — G4733 Obstructive sleep apnea (adult) (pediatric): Secondary | ICD-10-CM | POA: Diagnosis not present

## 2022-02-23 DIAGNOSIS — Z471 Aftercare following joint replacement surgery: Secondary | ICD-10-CM | POA: Diagnosis not present

## 2022-02-23 DIAGNOSIS — N189 Chronic kidney disease, unspecified: Secondary | ICD-10-CM | POA: Diagnosis not present

## 2022-02-23 DIAGNOSIS — I129 Hypertensive chronic kidney disease with stage 1 through stage 4 chronic kidney disease, or unspecified chronic kidney disease: Secondary | ICD-10-CM | POA: Diagnosis not present

## 2022-02-23 DIAGNOSIS — J309 Allergic rhinitis, unspecified: Secondary | ICD-10-CM | POA: Diagnosis not present

## 2022-02-23 DIAGNOSIS — Z87891 Personal history of nicotine dependence: Secondary | ICD-10-CM | POA: Diagnosis not present

## 2022-02-23 DIAGNOSIS — G5603 Carpal tunnel syndrome, bilateral upper limbs: Secondary | ICD-10-CM | POA: Diagnosis not present

## 2022-02-23 DIAGNOSIS — B182 Chronic viral hepatitis C: Secondary | ICD-10-CM | POA: Diagnosis not present

## 2022-02-23 DIAGNOSIS — E559 Vitamin D deficiency, unspecified: Secondary | ICD-10-CM | POA: Diagnosis not present

## 2022-02-23 DIAGNOSIS — Z89612 Acquired absence of left leg above knee: Secondary | ICD-10-CM | POA: Diagnosis not present

## 2022-02-28 DIAGNOSIS — Z471 Aftercare following joint replacement surgery: Secondary | ICD-10-CM | POA: Diagnosis not present

## 2022-02-28 DIAGNOSIS — I129 Hypertensive chronic kidney disease with stage 1 through stage 4 chronic kidney disease, or unspecified chronic kidney disease: Secondary | ICD-10-CM | POA: Diagnosis not present

## 2022-02-28 DIAGNOSIS — E785 Hyperlipidemia, unspecified: Secondary | ICD-10-CM | POA: Diagnosis not present

## 2022-02-28 DIAGNOSIS — G6289 Other specified polyneuropathies: Secondary | ICD-10-CM | POA: Diagnosis not present

## 2022-02-28 DIAGNOSIS — E559 Vitamin D deficiency, unspecified: Secondary | ICD-10-CM | POA: Diagnosis not present

## 2022-02-28 DIAGNOSIS — Z96651 Presence of right artificial knee joint: Secondary | ICD-10-CM | POA: Diagnosis not present

## 2022-02-28 DIAGNOSIS — G5603 Carpal tunnel syndrome, bilateral upper limbs: Secondary | ICD-10-CM | POA: Diagnosis not present

## 2022-02-28 DIAGNOSIS — G4733 Obstructive sleep apnea (adult) (pediatric): Secondary | ICD-10-CM | POA: Diagnosis not present

## 2022-02-28 DIAGNOSIS — J309 Allergic rhinitis, unspecified: Secondary | ICD-10-CM | POA: Diagnosis not present

## 2022-02-28 DIAGNOSIS — N189 Chronic kidney disease, unspecified: Secondary | ICD-10-CM | POA: Diagnosis not present

## 2022-02-28 DIAGNOSIS — Z87891 Personal history of nicotine dependence: Secondary | ICD-10-CM | POA: Diagnosis not present

## 2022-02-28 DIAGNOSIS — Z89612 Acquired absence of left leg above knee: Secondary | ICD-10-CM | POA: Diagnosis not present

## 2022-02-28 DIAGNOSIS — Z85830 Personal history of malignant neoplasm of bone: Secondary | ICD-10-CM | POA: Diagnosis not present

## 2022-02-28 DIAGNOSIS — I739 Peripheral vascular disease, unspecified: Secondary | ICD-10-CM | POA: Diagnosis not present

## 2022-02-28 DIAGNOSIS — B182 Chronic viral hepatitis C: Secondary | ICD-10-CM | POA: Diagnosis not present

## 2022-02-28 DIAGNOSIS — M160 Bilateral primary osteoarthritis of hip: Secondary | ICD-10-CM | POA: Diagnosis not present

## 2022-03-01 ENCOUNTER — Ambulatory Visit (HOSPITAL_COMMUNITY): Admission: RE | Admit: 2022-03-01 | Payer: BC Managed Care – PPO | Source: Ambulatory Visit

## 2022-03-01 DIAGNOSIS — E785 Hyperlipidemia, unspecified: Secondary | ICD-10-CM | POA: Diagnosis not present

## 2022-03-01 DIAGNOSIS — I739 Peripheral vascular disease, unspecified: Secondary | ICD-10-CM | POA: Diagnosis not present

## 2022-03-01 DIAGNOSIS — G4733 Obstructive sleep apnea (adult) (pediatric): Secondary | ICD-10-CM | POA: Diagnosis not present

## 2022-03-01 DIAGNOSIS — N189 Chronic kidney disease, unspecified: Secondary | ICD-10-CM | POA: Diagnosis not present

## 2022-03-01 DIAGNOSIS — I129 Hypertensive chronic kidney disease with stage 1 through stage 4 chronic kidney disease, or unspecified chronic kidney disease: Secondary | ICD-10-CM | POA: Diagnosis not present

## 2022-03-01 DIAGNOSIS — Z96651 Presence of right artificial knee joint: Secondary | ICD-10-CM | POA: Diagnosis not present

## 2022-03-01 DIAGNOSIS — G5603 Carpal tunnel syndrome, bilateral upper limbs: Secondary | ICD-10-CM | POA: Diagnosis not present

## 2022-03-01 DIAGNOSIS — B182 Chronic viral hepatitis C: Secondary | ICD-10-CM | POA: Diagnosis not present

## 2022-03-01 DIAGNOSIS — Z89612 Acquired absence of left leg above knee: Secondary | ICD-10-CM | POA: Diagnosis not present

## 2022-03-01 DIAGNOSIS — Z87891 Personal history of nicotine dependence: Secondary | ICD-10-CM | POA: Diagnosis not present

## 2022-03-01 DIAGNOSIS — Z471 Aftercare following joint replacement surgery: Secondary | ICD-10-CM | POA: Diagnosis not present

## 2022-03-01 DIAGNOSIS — J309 Allergic rhinitis, unspecified: Secondary | ICD-10-CM | POA: Diagnosis not present

## 2022-03-01 DIAGNOSIS — G6289 Other specified polyneuropathies: Secondary | ICD-10-CM | POA: Diagnosis not present

## 2022-03-01 DIAGNOSIS — M160 Bilateral primary osteoarthritis of hip: Secondary | ICD-10-CM | POA: Diagnosis not present

## 2022-03-01 DIAGNOSIS — E559 Vitamin D deficiency, unspecified: Secondary | ICD-10-CM | POA: Diagnosis not present

## 2022-03-01 DIAGNOSIS — Z85830 Personal history of malignant neoplasm of bone: Secondary | ICD-10-CM | POA: Diagnosis not present

## 2022-03-02 DIAGNOSIS — Z96651 Presence of right artificial knee joint: Secondary | ICD-10-CM | POA: Diagnosis not present

## 2022-03-02 DIAGNOSIS — M160 Bilateral primary osteoarthritis of hip: Secondary | ICD-10-CM | POA: Diagnosis not present

## 2022-03-02 DIAGNOSIS — I129 Hypertensive chronic kidney disease with stage 1 through stage 4 chronic kidney disease, or unspecified chronic kidney disease: Secondary | ICD-10-CM | POA: Diagnosis not present

## 2022-03-02 DIAGNOSIS — Z471 Aftercare following joint replacement surgery: Secondary | ICD-10-CM | POA: Diagnosis not present

## 2022-03-02 DIAGNOSIS — Z89612 Acquired absence of left leg above knee: Secondary | ICD-10-CM | POA: Diagnosis not present

## 2022-03-02 DIAGNOSIS — J309 Allergic rhinitis, unspecified: Secondary | ICD-10-CM | POA: Diagnosis not present

## 2022-03-02 DIAGNOSIS — I739 Peripheral vascular disease, unspecified: Secondary | ICD-10-CM | POA: Diagnosis not present

## 2022-03-02 DIAGNOSIS — N189 Chronic kidney disease, unspecified: Secondary | ICD-10-CM | POA: Diagnosis not present

## 2022-03-02 DIAGNOSIS — Z85830 Personal history of malignant neoplasm of bone: Secondary | ICD-10-CM | POA: Diagnosis not present

## 2022-03-02 DIAGNOSIS — E785 Hyperlipidemia, unspecified: Secondary | ICD-10-CM | POA: Diagnosis not present

## 2022-03-02 DIAGNOSIS — Z87891 Personal history of nicotine dependence: Secondary | ICD-10-CM | POA: Diagnosis not present

## 2022-03-02 DIAGNOSIS — B182 Chronic viral hepatitis C: Secondary | ICD-10-CM | POA: Diagnosis not present

## 2022-03-02 DIAGNOSIS — G6289 Other specified polyneuropathies: Secondary | ICD-10-CM | POA: Diagnosis not present

## 2022-03-02 DIAGNOSIS — G5603 Carpal tunnel syndrome, bilateral upper limbs: Secondary | ICD-10-CM | POA: Diagnosis not present

## 2022-03-02 DIAGNOSIS — E559 Vitamin D deficiency, unspecified: Secondary | ICD-10-CM | POA: Diagnosis not present

## 2022-03-02 DIAGNOSIS — G4733 Obstructive sleep apnea (adult) (pediatric): Secondary | ICD-10-CM | POA: Diagnosis not present

## 2022-03-05 ENCOUNTER — Encounter: Payer: Self-pay | Admitting: Orthopedic Surgery

## 2022-03-05 ENCOUNTER — Ambulatory Visit (INDEPENDENT_AMBULATORY_CARE_PROVIDER_SITE_OTHER): Payer: BC Managed Care – PPO | Admitting: Orthopedic Surgery

## 2022-03-05 DIAGNOSIS — Z96651 Presence of right artificial knee joint: Secondary | ICD-10-CM

## 2022-03-05 NOTE — Progress Notes (Addendum)
Office Visit Note   Patient: Stacy Thomas           Date of Birth: 03-26-1961           MRN: [id number] Visit Date: 03/05/2022              Requested by: Sanjuan Dame, MD [address] [place],  [place] [address] PCP: Sanjuan Dame, MD  Chief Complaint  Patient presents with   Right Knee - Routine Post Op    02/07/2022 Right TKA      HPI: Patient is 4 weeks status post right total knee arthroplasty.  She is working with home health therapy she is doing scar massage. Patient has a stable left above-the-knee amputation.  Assessment & Plan: Visit Diagnoses:  1. Status post total right knee replacement     Plan: Discussed the importance of working on knee extension exercises continued use ice recommended Shea butter for scar massage daily.  Follow-Up Instructions: Return in about 4 weeks (around 04/02/2022).   Ortho Exam  Patient is alert, oriented, no adenopathy, well-dressed, normal affect, normal respiratory effort. Examination patient lacks 10 degrees to full extension at rest.  With gentle compression she lacks about 5 degrees of full extension.  The scar is well-healed she is developing some thickness of the scar and recommended scar massage to avoid keloid.  There is no knee effusion.  Patient is an existing left transfemoral amputee.  She has a poorly fitting socket.  Patient's current comorbidities are not expected to impact the ability to function with the prescribed prosthesis. Patient verbally communicates a strong desire to use a prosthesis. Patient currently requires mobility aids to ambulate without a prosthesis.  Expects not to use mobility aids with a new prosthesis.  Patient is a K2 level ambulator that will use a prosthesis to walk around their home and the community over low level environmental barriers.      Imaging: No results found. No images are attached to the encounter.  Labs: Lab Results  Component Value Date   HGBA1C 5.8 (H)  12/05/2021   HGBA1C 5.9 (H) 02/20/2021   HGBA1C 5.9 (H) 02/29/2020     Lab Results  Component Value Date   ALBUMIN 3.8 01/29/2022   ALBUMIN 4.6 12/05/2021   ALBUMIN 4.5 11/10/2021    No results found for: "MG" Lab Results  Component Value Date   VD25OH 15.8 (L) 05/03/2016    No results found for: "PREALBUMIN"    Latest Ref Rng & Units 01/29/2022    8:47 AM 12/05/2021    9:56 AM 11/10/2021    9:39 AM  CBC EXTENDED  WBC 4.0 - 10.5 K/uL 4.1  4.2  4.1   RBC 3.87 - 5.11 MIL/uL 4.56  4.53  4.43   Hemoglobin 12.0 - 15.0 g/dL 13.7  13.5  13.2   HCT 36.0 - 46.0 % 40.8  39.8  38.6   Platelets 150 - 400 K/uL 215  210  225.0   NEUT# 1.4 - 7.0 x10E3/uL  1.8  1.4   Lymph# 0.7 - 3.1 x10E3/uL  1.9  2.2      There is no height or weight on file to calculate BMI.  Orders:  No orders of the defined types were placed in this encounter.  No orders of the defined types were placed in this encounter.    Procedures: No procedures performed  Clinical Data: No additional findings.  ROS:  All other systems negative, except as noted in the  HPI. Review of Systems  Objective: Vital Signs: LMP 12/02/2010   Specialty Comments:  No specialty comments available.  PMFS History: Patient Active Problem List   Diagnosis Date Noted   Total knee replacement status, right 02/07/2022   Malaise and fatigue 12/05/2021   Prediabetes 02/23/2021   Hair loss 02/23/2021   Memory changes 03/01/2020   CKD (chronic kidney disease) 09/11/2019   HLD (hyperlipidemia) 09/09/2018   Allergic sinusitis 12/03/2017   Hx of AKA (above knee amputation), left ([place]) 02/18/2017   Unilateral primary osteoarthritis, right knee 02/18/2017   Vitamin D deficiency 05/04/2016   Bilateral primary osteoarthritis of hip 04/26/2016   Bilateral carpal tunnel syndrome 03/29/2016   Screening for cervical cancer 03/06/2016   PVD (peripheral vascular disease) ([place]) 03/05/2016   Pancreatic abnormality 03/05/2016    Hypertension 03/05/2016   Hepatic cirrhosis ([place]) 11/15/2015   Chronic hepatitis C ([place]) 06/15/2015   Prolapsed internal hemorrhoids, grade 4 06/14/2015   Hx of osteosarcoma 06/14/2015   Past Medical History:  Diagnosis Date   Bilateral arm pain    chronic, due to use of crutches   Dependence on crutches    left leg prosthesis   Drug addiction in remission ([place])    since 2007  (crack cocaine,  IV drug use)   Hand weakness    Hepatic cirrhosis ([place]) 2018   followed by dr Havery Moros (gi) due to hx chronic hepatitis c   Hiatal hernia    History of esophageal stricture    post dilation x2   History of hepatitis C    pt completed harvoni treatment 2017, cured;  genotype 1a with fibrosis 3/4,  previously seen by dr comer   History of osteosarcoma    1982  left knee---  s/p  AKA LLE--   per pt no recurrence   History of recreational drug use multiple rehab visits   IV drug use for 1 yr and Smoked crack for 30+yrs--  per pt clean since 2007   Hyperlipidemia    Hypertension    Idiopathic peripheral neuropathy    OSA (obstructive sleep apnea)    study in epic 10-27-2018 by dr Rexene Alberts,  moderate osa,  pt stated used cpap until approx. 07/ 2021, intolerate of mask   Osteoarthritis    Pancreatic cyst    followed by dr Havery Moros--- per office note bening and stable   Sigmoid diverticulosis    mild   Wears glasses     Family History  Problem Relation Age of Onset   COPD Father    Alcohol abuse Father        Deceased   Hypertension Mother        Living   Healthy Daughter    Healthy Son    Anesthesia problems Neg Hx    Breast cancer Neg Hx    Colon cancer Neg Hx    Esophageal cancer Neg Hx    Rectal cancer Neg Hx    Stomach cancer Neg Hx    Colon polyps Neg Hx     Past Surgical History:  Procedure Laterality Date   ABOVE KNEE LEG AMPUTATION Left 1982   osteosarcoma   CARPAL TUNNEL RELEASE Right 10/18/2021   Procedure: RIGHT CARPAL TUNNEL RELEASE;  Surgeon: Leandrew Koyanagi, MD;   Location: [facility];  Service: Orthopedics;  Laterality: Right;   COLONOSCOPY WITH PROPOFOL  last one 04-28-2020  dr Havery Moros   ESOPHAGOGASTRODUODENOSCOPY (EGD) WITH PROPOFOL  last one 11-02-2019  dr Havery Moros  EXCISION BREAST BX  2015   CYST   EXCISION OF SKIN TAG N/A 07/14/2020   Procedure: EXCISION OF ANAL PAPILLA;  Surgeon: Leighton Ruff, MD;  Location: [facility];  Service: General;  Laterality: N/A;   STERIOD INJECTION Left 10/18/2021   Procedure: LEFT CARPAL TUNNEL INJECTION;  Surgeon: Leandrew Koyanagi, MD;  Location: [facility];  Service: Orthopedics;  Laterality: Left;   TONSILLECTOMY  as child   TOTAL KNEE ARTHROPLASTY Right 02/07/2022   Procedure: RIGHT TOTAL KNEE ARTHROPLASTY;  Surgeon: Newt Minion, MD;  Location: [employer];  Service: Orthopedics;  Laterality: Right;   TRANSANAL HEMORRHOIDAL DEARTERIALIZATION N/A 10/27/2015   Procedure: TRANSANAL HEMORRHOIDAL DEARTERIALIZATION;  Surgeon: Michael Boston, MD;  Location: [facility];  Service: General;  Laterality: N/A;   UPPER GASTROINTESTINAL ENDOSCOPY     Social History   Occupational History   Not on file  Tobacco Use   Smoking status: Former    Packs/day: 1.00    Years: 37.00    Total pack years: 37.00    Types: Cigarettes    Start date: 04/02/1973    Quit date: 12/19/2014    Years since quitting: 7.2   Smokeless tobacco: Never  Vaping Use   Vaping Use: Never used  Substance and Sexual Activity   Alcohol use: Never    Alcohol/week: 0.0 standard drinks of alcohol   Drug use: Not Currently    Types: "Crack" cocaine    Comment: recovery addict since 2007   Sexual activity: Not on file

## 2022-03-06 ENCOUNTER — Encounter (HOSPITAL_COMMUNITY): Payer: Self-pay

## 2022-03-06 ENCOUNTER — Ambulatory Visit (HOSPITAL_COMMUNITY): Payer: BC Managed Care – PPO

## 2022-03-07 DIAGNOSIS — Z87891 Personal history of nicotine dependence: Secondary | ICD-10-CM | POA: Diagnosis not present

## 2022-03-07 DIAGNOSIS — Z89612 Acquired absence of left leg above knee: Secondary | ICD-10-CM | POA: Diagnosis not present

## 2022-03-07 DIAGNOSIS — J309 Allergic rhinitis, unspecified: Secondary | ICD-10-CM | POA: Diagnosis not present

## 2022-03-07 DIAGNOSIS — M160 Bilateral primary osteoarthritis of hip: Secondary | ICD-10-CM | POA: Diagnosis not present

## 2022-03-07 DIAGNOSIS — B182 Chronic viral hepatitis C: Secondary | ICD-10-CM | POA: Diagnosis not present

## 2022-03-07 DIAGNOSIS — Z96651 Presence of right artificial knee joint: Secondary | ICD-10-CM | POA: Diagnosis not present

## 2022-03-07 DIAGNOSIS — G5603 Carpal tunnel syndrome, bilateral upper limbs: Secondary | ICD-10-CM | POA: Diagnosis not present

## 2022-03-07 DIAGNOSIS — E559 Vitamin D deficiency, unspecified: Secondary | ICD-10-CM | POA: Diagnosis not present

## 2022-03-07 DIAGNOSIS — N189 Chronic kidney disease, unspecified: Secondary | ICD-10-CM | POA: Diagnosis not present

## 2022-03-07 DIAGNOSIS — I739 Peripheral vascular disease, unspecified: Secondary | ICD-10-CM | POA: Diagnosis not present

## 2022-03-07 DIAGNOSIS — G6289 Other specified polyneuropathies: Secondary | ICD-10-CM | POA: Diagnosis not present

## 2022-03-07 DIAGNOSIS — G4733 Obstructive sleep apnea (adult) (pediatric): Secondary | ICD-10-CM | POA: Diagnosis not present

## 2022-03-07 DIAGNOSIS — Z471 Aftercare following joint replacement surgery: Secondary | ICD-10-CM | POA: Diagnosis not present

## 2022-03-07 DIAGNOSIS — E785 Hyperlipidemia, unspecified: Secondary | ICD-10-CM | POA: Diagnosis not present

## 2022-03-07 DIAGNOSIS — Z85830 Personal history of malignant neoplasm of bone: Secondary | ICD-10-CM | POA: Diagnosis not present

## 2022-03-07 DIAGNOSIS — I129 Hypertensive chronic kidney disease with stage 1 through stage 4 chronic kidney disease, or unspecified chronic kidney disease: Secondary | ICD-10-CM | POA: Diagnosis not present

## 2022-03-08 DIAGNOSIS — N189 Chronic kidney disease, unspecified: Secondary | ICD-10-CM | POA: Diagnosis not present

## 2022-03-08 DIAGNOSIS — E559 Vitamin D deficiency, unspecified: Secondary | ICD-10-CM | POA: Diagnosis not present

## 2022-03-08 DIAGNOSIS — Z89612 Acquired absence of left leg above knee: Secondary | ICD-10-CM | POA: Diagnosis not present

## 2022-03-08 DIAGNOSIS — G6289 Other specified polyneuropathies: Secondary | ICD-10-CM | POA: Diagnosis not present

## 2022-03-08 DIAGNOSIS — Z85830 Personal history of malignant neoplasm of bone: Secondary | ICD-10-CM | POA: Diagnosis not present

## 2022-03-08 DIAGNOSIS — I129 Hypertensive chronic kidney disease with stage 1 through stage 4 chronic kidney disease, or unspecified chronic kidney disease: Secondary | ICD-10-CM | POA: Diagnosis not present

## 2022-03-08 DIAGNOSIS — I739 Peripheral vascular disease, unspecified: Secondary | ICD-10-CM | POA: Diagnosis not present

## 2022-03-08 DIAGNOSIS — G4733 Obstructive sleep apnea (adult) (pediatric): Secondary | ICD-10-CM | POA: Diagnosis not present

## 2022-03-08 DIAGNOSIS — Z96651 Presence of right artificial knee joint: Secondary | ICD-10-CM | POA: Diagnosis not present

## 2022-03-08 DIAGNOSIS — Z87891 Personal history of nicotine dependence: Secondary | ICD-10-CM | POA: Diagnosis not present

## 2022-03-08 DIAGNOSIS — B182 Chronic viral hepatitis C: Secondary | ICD-10-CM | POA: Diagnosis not present

## 2022-03-08 DIAGNOSIS — J309 Allergic rhinitis, unspecified: Secondary | ICD-10-CM | POA: Diagnosis not present

## 2022-03-08 DIAGNOSIS — G5603 Carpal tunnel syndrome, bilateral upper limbs: Secondary | ICD-10-CM | POA: Diagnosis not present

## 2022-03-08 DIAGNOSIS — Z471 Aftercare following joint replacement surgery: Secondary | ICD-10-CM | POA: Diagnosis not present

## 2022-03-08 DIAGNOSIS — E785 Hyperlipidemia, unspecified: Secondary | ICD-10-CM | POA: Diagnosis not present

## 2022-03-08 DIAGNOSIS — M160 Bilateral primary osteoarthritis of hip: Secondary | ICD-10-CM | POA: Diagnosis not present

## 2022-03-09 ENCOUNTER — Other Ambulatory Visit: Payer: Self-pay | Admitting: Orthopedic Surgery

## 2022-03-09 DIAGNOSIS — Z471 Aftercare following joint replacement surgery: Secondary | ICD-10-CM | POA: Diagnosis not present

## 2022-03-09 DIAGNOSIS — E559 Vitamin D deficiency, unspecified: Secondary | ICD-10-CM | POA: Diagnosis not present

## 2022-03-09 DIAGNOSIS — J309 Allergic rhinitis, unspecified: Secondary | ICD-10-CM | POA: Diagnosis not present

## 2022-03-09 DIAGNOSIS — Z85830 Personal history of malignant neoplasm of bone: Secondary | ICD-10-CM | POA: Diagnosis not present

## 2022-03-09 DIAGNOSIS — Z87891 Personal history of nicotine dependence: Secondary | ICD-10-CM | POA: Diagnosis not present

## 2022-03-09 DIAGNOSIS — B182 Chronic viral hepatitis C: Secondary | ICD-10-CM | POA: Diagnosis not present

## 2022-03-09 DIAGNOSIS — E785 Hyperlipidemia, unspecified: Secondary | ICD-10-CM | POA: Diagnosis not present

## 2022-03-09 DIAGNOSIS — Z89612 Acquired absence of left leg above knee: Secondary | ICD-10-CM | POA: Diagnosis not present

## 2022-03-09 DIAGNOSIS — G6289 Other specified polyneuropathies: Secondary | ICD-10-CM | POA: Diagnosis not present

## 2022-03-09 DIAGNOSIS — I739 Peripheral vascular disease, unspecified: Secondary | ICD-10-CM | POA: Diagnosis not present

## 2022-03-09 DIAGNOSIS — Z96651 Presence of right artificial knee joint: Secondary | ICD-10-CM

## 2022-03-09 DIAGNOSIS — N189 Chronic kidney disease, unspecified: Secondary | ICD-10-CM | POA: Diagnosis not present

## 2022-03-09 DIAGNOSIS — G5603 Carpal tunnel syndrome, bilateral upper limbs: Secondary | ICD-10-CM | POA: Diagnosis not present

## 2022-03-09 DIAGNOSIS — I129 Hypertensive chronic kidney disease with stage 1 through stage 4 chronic kidney disease, or unspecified chronic kidney disease: Secondary | ICD-10-CM | POA: Diagnosis not present

## 2022-03-09 DIAGNOSIS — M160 Bilateral primary osteoarthritis of hip: Secondary | ICD-10-CM | POA: Diagnosis not present

## 2022-03-09 DIAGNOSIS — G4733 Obstructive sleep apnea (adult) (pediatric): Secondary | ICD-10-CM | POA: Diagnosis not present

## 2022-03-12 ENCOUNTER — Ambulatory Visit (INDEPENDENT_AMBULATORY_CARE_PROVIDER_SITE_OTHER): Payer: Self-pay | Admitting: Physical Therapy

## 2022-03-12 ENCOUNTER — Encounter: Payer: Self-pay | Admitting: Physical Therapy

## 2022-03-12 ENCOUNTER — Other Ambulatory Visit: Payer: Self-pay

## 2022-03-12 DIAGNOSIS — M25661 Stiffness of right knee, not elsewhere classified: Secondary | ICD-10-CM

## 2022-03-12 DIAGNOSIS — R2689 Other abnormalities of gait and mobility: Secondary | ICD-10-CM

## 2022-03-12 DIAGNOSIS — R600 Localized edema: Secondary | ICD-10-CM

## 2022-03-12 DIAGNOSIS — M25561 Pain in right knee: Secondary | ICD-10-CM

## 2022-03-12 DIAGNOSIS — R2681 Unsteadiness on feet: Secondary | ICD-10-CM

## 2022-03-12 DIAGNOSIS — M6281 Muscle weakness (generalized): Secondary | ICD-10-CM

## 2022-03-12 NOTE — Therapy (Signed)
OUTPATIENT PHYSICAL THERAPY LOWER EXTREMITY EVALUATION   Patient Name: Stacy Thomas MRN: [id number] DOB:1960/05/30, 61 y.o., female Today's Date: 03/12/2022  END OF SESSION:  PT End of Session - 03/12/22 1704     Visit Number 1    Number of Visits 12    Date for PT Re-Evaluation 04/20/22    Authorization Type BCBS    Authorization Time Period $150 COPAY, 17 PT VISITS REMAIN OUT OF 30 COMBINED PT/OT/CHIRO PER SERVICE YEAR    Authorization - Visit Number 1    Authorization - Number of Visits 17    Progress Note Due on Visit 10    PT Start Time 3716    PT Stop Time 1425    PT Time Calculation (min) 40 min             Past Medical History:  Diagnosis Date   Bilateral arm pain    chronic, due to use of crutches   Dependence on crutches    left leg prosthesis   Drug addiction in remission ([place])    since 2007  (crack cocaine,  IV drug use)   Hand weakness    Hepatic cirrhosis ([place]) 2018   followed by dr Havery Moros (gi) due to hx chronic hepatitis c   Hiatal hernia    History of esophageal stricture    post dilation x2   History of hepatitis C    pt completed harvoni treatment 2017, cured;  genotype 1a with fibrosis 3/4,  previously seen by dr comer   History of osteosarcoma    1982  left knee---  s/p  AKA LLE--   per pt no recurrence   History of recreational drug use multiple rehab visits   IV drug use for 1 yr and Smoked crack for 30+yrs--  per pt clean since 2007   Hyperlipidemia    Hypertension    Idiopathic peripheral neuropathy    OSA (obstructive sleep apnea)    study in epic 10-27-2018 by dr Rexene Alberts,  moderate osa,  pt stated used cpap until approx. 07/ 2021, intolerate of mask   Osteoarthritis    Pancreatic cyst    followed by dr Havery Moros--- per office note bening and stable   Sigmoid diverticulosis    mild   Wears glasses    Past Surgical History:  Procedure Laterality Date   ABOVE KNEE LEG AMPUTATION Left 1982   osteosarcoma   CARPAL TUNNEL  RELEASE Right 10/18/2021   Procedure: RIGHT CARPAL TUNNEL RELEASE;  Surgeon: Leandrew Koyanagi, MD;  Location: [facility];  Service: Orthopedics;  Laterality: Right;   COLONOSCOPY WITH PROPOFOL  last one 04-28-2020  dr Havery Moros   ESOPHAGOGASTRODUODENOSCOPY (EGD) WITH PROPOFOL  last one 11-02-2019  dr Havery Moros   EXCISION BREAST BX  2015   CYST   EXCISION OF SKIN TAG N/A 07/14/2020   Procedure: EXCISION OF ANAL PAPILLA;  Surgeon: Leighton Ruff, MD;  Location[facility];  Service: General;  Laterality: N/A;   STERIOD INJECTION Left 10/18/2021   Procedure: LEFT CARPAL TUNNEL INJECTION;  Surgeon: Leandrew Koyanagi, MD;  Location: [facility];  Service: Orthopedics;  Laterality: Left;   TONSILLECTOMY  as child   TOTAL KNEE ARTHROPLASTY Right 02/07/2022   Procedure: RIGHT TOTAL KNEE ARTHROPLASTY;  Surgeon: Newt Minion, MD;  Location: [facility];  Service: Orthopedics;  Laterality: Right;   TRANSANAL HEMORRHOIDAL DEARTERIALIZATION N/A 10/27/2015   Procedure: TRANSANAL HEMORRHOIDAL DEARTERIALIZATION;  Surgeon: Michael Boston, MD;  Location: [facility];  Service: General;  Laterality: N/A;   UPPER GASTROINTESTINAL ENDOSCOPY     Patient Active Problem List   Diagnosis Date Noted   Total knee replacement status, right 02/07/2022   Malaise and fatigue 12/05/2021   Prediabetes 02/23/2021   Hair loss 02/23/2021   Memory changes 03/01/2020   CKD (chronic kidney disease) 09/11/2019   HLD (hyperlipidemia) 09/09/2018   Allergic sinusitis 12/03/2017   Hx of AKA (above knee amputation), left ([place]) 02/18/2017   Unilateral primary osteoarthritis, right knee 02/18/2017   Vitamin D deficiency 05/04/2016   Bilateral primary osteoarthritis of hip 04/26/2016   Bilateral carpal tunnel syndrome 03/29/2016   Screening for cervical cancer 03/06/2016   PVD (peripheral vascular disease) ([place]) 03/05/2016   Pancreatic abnormality 03/05/2016   Hypertension 03/05/2016    Hepatic cirrhosis ([place]) 11/15/2015   Chronic hepatitis C ([place]) 06/15/2015   Prolapsed internal hemorrhoids, grade 4 06/14/2015   Hx of osteosarcoma 06/14/2015    PCP: Sanjuan Dame, MD  REFERRING PROVIDER: Newt Minion, MD  REFERRING DIAG: [phone] (ICD-10-CM) - Status post total right knee replacement    THERAPY DIAG:  Stiffness of right knee, not elsewhere classified  Acute pain of right knee  Muscle weakness (generalized)  Other abnormalities of gait and mobility  Unsteadiness on feet  Localized edema  Rationale for Evaluation and Treatment: Rehabilitation  ONSET DATE: 02/07/22 TKA  SUBJECTIVE:   SUBJECTIVE STATEMENT: This 61yo female underwnt right Total Knee Replacement on 02/07/2022 due to pain & functional disability with arthritis. She had HHPT until 03/09/22.  She is able to do her exercises.  She has history of left AKA at 61yo due to osteosarcoma.  Her current prosthesis is 63 yrs old. [name], [facility] is her prosthetist.   PERTINENT HISTORY: CKD, HLD, left AKA, osteosarcoma, PVD, HTN, hepatic cirrhosis, hep C  PAIN:  Are you having pain? Yes: NPRS scale: 8/10 today and over last week 8/10-10/10 Pain location: Left Knee Pain description: ache, sore, muscle spasm,  Aggravating factors: move knee Relieving factors: take tylenol, ice a little  PRECAUTIONS: Fall  WEIGHT BEARING RESTRICTIONS: No  FALLS:  Has patient fallen in last 6 months? No  LIVING ENVIRONMENT: Lives with: lives with their spouse Lives in: House Stairs: No Has following equipment at home: Environmental consultant - 2 wheeled, Health visitor, Radio broadcast assistant (power), Electronics engineer, and Grab bars  OCCUPATION: disabled  RECREATION: active in church  PLOF: Independent, Independent with household mobility without device, and Independent with community mobility without device  prosthesis only.    PATIENT GOALS: get back to prior level, work in yard, be active in church handing out meals to homeless.   NEXT MD  VISIT: 04/05/2022  OBJECTIVE:   DIAGNOSTIC FINDINGS:   PATIENT SURVEYS:  FOTO intake:  40%  predicted:  62%  COGNITION: Overall cognitive status: WFL   EDEMA:  RLE: above knee 40.3cm,  around knee 40cm,  below knee 34.5cm  which appears to be moderate edema LLE has AKA so unable to compare LEs  POSTURE: forward head and flexed trunk   PALPATION: Tenderness along right knee joint line and incision.  Tightness in ITB, quad, hamstring & gastroc.   LOWER EXTREMITY ROM:   ROM Right eval Left eval  Hip flexion    Hip extension    Hip abduction    Hip adduction    Hip internal rotation    Hip external rotation    Knee flexion Seated P: 111* A: 107*   Knee extension  Seated A: -20* P: -19*    Ankle dorsiflexion    Ankle plantarflexion    Ankle inversion    Ankle eversion     (Blank rows = not tested)  LOWER EXTREMITY MMT:  MMT Right eval Left eval  Hip flexion    Hip extension    Hip abduction    Hip adduction    Hip internal rotation    Hip external rotation    Knee flexion 3-/5   Knee extension 3-/5   Ankle dorsiflexion    Ankle plantarflexion    Ankle inversion    Ankle eversion     (Blank rows = not tested)  FUNCTIONAL TESTS:  18 inch chair transfer: requires UE assist on armrests.   GAIT: Distance walked: 10' Assistive device utilized: left Transfemoral prosthesis & no AD Level of assistance: SBA Comments: right knee flexed in stance   TODAY'S TREATMENT                                                                          DATE:  03/12/2022 Therex:    HEP instruction/performance c cues for techniques, handout provided.  Trial set performed of each for comprehension and symptom assessment.  See below for exercise list  PATIENT EDUCATION:  Education details: HEP, POC Person educated: Patient Education method: Explanation, Demonstration, Verbal cues, and Handouts Education comprehension: verbalized understanding, returned demonstration, and  verbal cues required  HOME EXERCISE PROGRAM: Access Code: [email or web address] URL: [email or web address] Date: 03/12/2022 Prepared by: Jamey Reas  Exercises - Quad Setting and Stretching  - 2-4 x daily - 7 x weekly - 5-10 sets - 10 reps - prop 5-10 minutes & quad set5 seconds hold - Supine Leg Press  - 2-4 x daily - 7 x weekly - 5-10 sets - 10 reps - 5 seconds hold - Supine Heel Slide with Strap  - 2-3 x daily - 7 x weekly - 2-3 sets - 10 reps - 5 seconds hold - Supine Knee Extension Strengthening  - 2-3 x daily - 7 x weekly - 2-3 sets - 10 reps - 5 seconds hold - Seated Knee Flexion Extension AROM   - 2-4 x daily - 7 x weekly - 2-3 sets - 10 reps - 5 seconds hold - Seated Hamstring Stretch with Strap  - 2-4 x daily - 7 x weekly - 1 sets - 3 reps - 20-30 seconds hold  ASSESSMENT:  CLINICAL IMPRESSION: Patient is a 61 y.o. who comes to clinic with complaints of right pain with mobility, strength and movement coordination deficits that impair their ability to perform usual daily and recreational functional activities without increase difficulty/symptoms at this time. Patient's mobility is complicated by left AKA.  Patient to benefit from skilled PT services to address impairments and limitations to improve to previous level of function without restriction secondary to condition.   OBJECTIVE IMPAIRMENTS: Abnormal gait, decreased balance, decreased mobility, difficulty walking, decreased ROM, decreased strength, increased edema, increased muscle spasms, and pain.   ACTIVITY LIMITATIONS: carrying, lifting, standing, sleeping, stairs, transfers, and locomotion level  PARTICIPATION LIMITATIONS: meal prep, community activity, and church  PERSONAL FACTORS: Fitness, Time since onset of injury/illness/exacerbation, and 3+ comorbidities: see PMH  are also affecting patient's functional  outcome.   REHAB POTENTIAL: Good  CLINICAL DECISION MAKING: Stable/uncomplicated  EVALUATION COMPLEXITY:  Low   GOALS: Goals reviewed with patient? Yes  SHORT TERM GOALS: (target date for Short term goals are 3 weeks 03/30/2022)   1.  Patient will demonstrate independent use of home exercise program to maintain progress from in clinic treatments.  Goal status: New  LONG TERM GOALS: (target dates for all long term goals are 6 weeks  04/20/2022 )   1. Patient will demonstrate/report pain at worst less than or equal to 2/10 to facilitate minimal limitation in daily activity secondary to pain symptoms.  Goal status: New   2. Patient will demonstrate independent use of home exercise program to facilitate ability to maintain/progress functional gains from skilled physical therapy services.  Goal status: New   3. Patient will demonstrate FOTO outcome > or = 62 % to indicate reduced disability due to condition.  Goal status: New   4.  Right knee extension standing -5* AROM.   Goal status: New   5.  Patient ambulates >200' with prosthesis only modified independent. Goal status: New   6.  pt negotiates ramps & curbs with prosthesis only modified independent. Goal status: New     PLAN:  PT FREQUENCY: 2x/week  PT DURATION: 6 weeks  PLANNED INTERVENTIONS: Therapeutic exercises, Therapeutic activity, Neuro Muscular re-education, Balance training, Gait training, Patient/Family education, Joint mobilization, Stair training, DME instructions, Dry Needling, Electrical stimulation, Traction, Cryotherapy, vasopneumatic deviceMoist heat, Taping, Ultrasound, Ionotophoresis '4mg'$ /ml Dexamethasone, and Manual therapy.  All included unless contraindicated  PLAN FOR NEXT SESSION: Review HEP knowledge/results. Manual therapy & exercise for knee ROM especially extension.    Jamey Reas, PT, DPT 03/12/2022, 5:09 PM

## 2022-03-16 ENCOUNTER — Ambulatory Visit: Payer: Self-pay | Admitting: Physical Therapy

## 2022-03-16 ENCOUNTER — Encounter: Payer: Self-pay | Admitting: Physical Therapy

## 2022-03-16 ENCOUNTER — Ambulatory Visit (INDEPENDENT_AMBULATORY_CARE_PROVIDER_SITE_OTHER): Payer: BC Managed Care – PPO | Admitting: Physical Therapy

## 2022-03-16 DIAGNOSIS — M6281 Muscle weakness (generalized): Secondary | ICD-10-CM

## 2022-03-16 DIAGNOSIS — M25661 Stiffness of right knee, not elsewhere classified: Secondary | ICD-10-CM

## 2022-03-16 DIAGNOSIS — M25561 Pain in right knee: Secondary | ICD-10-CM

## 2022-03-16 DIAGNOSIS — R2689 Other abnormalities of gait and mobility: Secondary | ICD-10-CM

## 2022-03-16 DIAGNOSIS — R600 Localized edema: Secondary | ICD-10-CM

## 2022-03-16 DIAGNOSIS — R2681 Unsteadiness on feet: Secondary | ICD-10-CM

## 2022-03-16 NOTE — Therapy (Signed)
OUTPATIENT PHYSICAL THERAPY TREATMENT   Patient Name: Stacy Thomas MRN: [id number] DOB:Apr 02, 1961, 61 y.o., female Today's Date: 03/16/2022  END OF SESSION:  PT End of Session - 03/16/22 1027     Visit Number 2    Number of Visits 12    Date for PT Re-Evaluation 04/20/22    Authorization Type BC[employer]    Authorization Time Period $150 COPAY, 17 PT VISITS REMAIN OUT OF 30 COMBINED PT/OT/CHIRO PER SERVICE YEAR    Authorization - Visit Number 2    Authorization - Number of Visits 17    Progress Note Due on Visit 10    PT Start Time 1018    PT Stop Time 1100    PT Time Calculation (min) 42 min             Past Medical History:  Diagnosis Date   Bilateral arm pain    chronic, due to use of crutches   Dependence on crutches    left leg prosthesis   Drug addiction in remission ([place])    since 2007  (crack cocaine,  IV drug use)   Hand weakness    Hepatic cirrhosis ([place]) 2018   followed by dr Havery Moros (gi) due to hx chronic hepatitis c   Hiatal hernia    History of esophageal stricture    post dilation x2   History of hepatitis C    pt completed [facility] treatment 2017, cured;  genotype 1a with fibrosis 3/4,  previously seen by dr comer   History of osteosarcoma    1982  left knee---  s/p  AKA LLE--   per pt no recurrence   History of recreational drug use multiple rehab visits   IV drug use for 1 yr and Smoked crack for 30+yrs--  per pt clean since 2007   Hyperlipidemia    Hypertension    Idiopathic peripheral neuropathy    OSA (obstructive sleep apnea)    study in epic 10-27-2018 by dr Rexene Alberts,  moderate osa,  pt stated used cpap until approx. 07/ 2021, intolerate of mask   Osteoarthritis    Pancreatic cyst    followed by dr Havery Moros--- per office note bening and stable   Sigmoid diverticulosis    mild   Wears glasses    Past Surgical History:  Procedure Laterality Date   ABOVE KNEE LEG AMPUTATION Left 1982   osteosarcoma   CARPAL TUNNEL RELEASE Right  10/18/2021   Procedure: RIGHT CARPAL TUNNEL RELEASE;  Surgeon: Leandrew Koyanagi, MD;  Location: [facility];  Service: Orthopedics;  Laterality: Right;   COLONOSCOPY WITH PROPOFOL  last one 04-28-2020  dr Havery Moros   ESOPHAGOGASTRODUODENOSCOPY (EGD) WITH PROPOFOL  last one 11-02-2019  dr Havery Moros   EXCISION BREAST BX  2015   CYST   EXCISION OF SKIN TAG N/A 07/14/2020   Procedure: EXCISION OF ANAL PAPILLA;  Surgeon: Leighton Ruff, MD;  Location: [facility];  Service: General;  Laterality: N/A;   STERIOD INJECTION Left 10/18/2021   Procedure: LEFT CARPAL TUNNEL INJECTION;  Surgeon: Leandrew Koyanagi, MD;  Location: [facility];  Service: Orthopedics;  Laterality: Left;   TONSILLECTOMY  as child   TOTAL KNEE ARTHROPLASTY Right 02/07/2022   Procedure: RIGHT TOTAL KNEE ARTHROPLASTY;  Surgeon: Newt Minion, MD;  Location: [facility];  Service: Orthopedics;  Laterality: Right;   TRANSANAL HEMORRHOIDAL DEARTERIALIZATION N/A 10/27/2015   Procedure: TRANSANAL HEMORRHOIDAL DEARTERIALIZATION;  Surgeon: Michael Boston, MD;  Location: [facility];  Service: General;  Laterality: N/A;   UPPER GASTROINTESTINAL ENDOSCOPY     Patient Active Problem List   Diagnosis Date Noted   Total knee replacement status, right 02/07/2022   Malaise and fatigue 12/05/2021   Prediabetes 02/23/2021   Hair loss 02/23/2021   Memory changes 03/01/2020   CKD (chronic kidney disease) 09/11/2019   HLD (hyperlipidemia) 09/09/2018   Allergic sinusitis 12/03/2017   Hx of AKA (above knee amputation), left ([place]) 02/18/2017   Unilateral primary osteoarthritis, right knee 02/18/2017   Vitamin D deficiency 05/04/2016   Bilateral primary osteoarthritis of hip 04/26/2016   Bilateral carpal tunnel syndrome 03/29/2016   Screening for cervical cancer 03/06/2016   PVD (peripheral vascular disease) ([place]) 03/05/2016   Pancreatic abnormality 03/05/2016   Hypertension 03/05/2016   Hepatic  cirrhosis ([place]) 11/15/2015   Chronic hepatitis C ([place]) 06/15/2015   Prolapsed internal hemorrhoids, grade 4 06/14/2015   Hx of osteosarcoma 06/14/2015    PCP: Sanjuan Dame, MD  REFERRING PROVIDER: Newt Minion, MD  REFERRING DIAG: [phone] (ICD-10-CM) - Status post total right knee replacement    THERAPY DIAG:  Stiffness of right knee, not elsewhere classified  Acute pain of right knee  Muscle weakness (generalized)  Other abnormalities of gait and mobility  Unsteadiness on feet  Localized edema  Rationale for Evaluation and Treatment: Rehabilitation  ONSET DATE: 02/07/22 TKA  SUBJECTIVE:   SUBJECTIVE STATEMENT: Relays a lot of Rt knee pain today, relays her left prosthesis does not fit well as she has lost weight since surgery. Says she hasn't really done her HEP like she should due to too much pain.   PERTINENT HISTORY: CKD, HLD, left AKA, osteosarcoma, PVD, HTN, hepatic cirrhosis, hep C  PAIN:  Are you having pain? Yes: NPRS scale: 8/10 today and over last week 8/10-10/10 Pain location: Left Knee Pain description: ache, sore, muscle spasm,  Aggravating factors: move knee Relieving factors: take tylenol, ice a little  PRECAUTIONS: Fall  WEIGHT BEARING RESTRICTIONS: No  FALLS:  Has patient fallen in last 6 months? No  LIVING ENVIRONMENT: Lives with: lives with their spouse Lives in: House Stairs: No Has following equipment at home: Environmental consultant - 2 wheeled, Health visitor, Radio broadcast assistant (power), Electronics engineer, and Grab bars  OCCUPATION: disabled  RECREATION: active in church  PLOF: Independent, Independent with household mobility without device, and Independent with community mobility without device  prosthesis only.    PATIENT GOALS: get back to prior level, work in yard, be active in church handing out meals to homeless.   NEXT MD VISIT: 04/05/2022  OBJECTIVE:   DIAGNOSTIC FINDINGS:   PATIENT SURVEYS:  FOTO intake:  40%  predicted:   62%  COGNITION: Overall cognitive status: WFL   EDEMA:  RLE: above knee 40.3cm,  around knee 40cm,  below knee 34.5cm  which appears to be moderate edema LLE has AKA so unable to compare LEs  POSTURE: forward head and flexed trunk   PALPATION: Tenderness along right knee joint line and incision.  Tightness in ITB, quad, hamstring & gastroc.   LOWER EXTREMITY ROM:   ROM Right eval Left eval  Hip flexion    Hip extension    Hip abduction    Hip adduction    Hip internal rotation    Hip external rotation    Knee flexion Seated P: 111* A: 107*   Knee extension Seated A: -20* P: -19*    Ankle dorsiflexion    Ankle plantarflexion    Ankle inversion  Ankle eversion     (Blank rows = not tested)  LOWER EXTREMITY MMT:  MMT Right eval Left eval  Hip flexion    Hip extension    Hip abduction    Hip adduction    Hip internal rotation    Hip external rotation    Knee flexion 3-/5   Knee extension 3-/5   Ankle dorsiflexion    Ankle plantarflexion    Ankle inversion    Ankle eversion     (Blank rows = not tested)  FUNCTIONAL TESTS:  18 inch chair transfer: requires UE assist on armrests.   GAIT: Distance walked: 10' Assistive device utilized: left Transfemoral prosthesis & no AD Level of assistance: SBA Comments: right knee flexed in stance   TODAY'S TREATMENT                                                                          DATE:  03/16/2022 Therex: Nu step L6 UE/LE for 3 min, then performed LE only at L3 for 2 more minutes then stopped at her request due to pain Seated hamstring stretch 3 X 30 sec on Rt Seated quad set then moving back into knee flexion AROM X 10 Seated SLR X 10 Supine heelslides X 15 Supine quad sets X 5 (stopped at her request due to pain)  Manual therapy: Rt knee PROM to tolerance flexion and extension but more emphasis on extension, manual hamstring stretching 10 sec X 5 but limited by pain  Moist heat X 10 min to Rt  hamstrings with legs elevated. (Not included in treatment time)       TODAY'S TREATMENT                                                                          DATE:  03/12/2022 Therex:    HEP instruction/performance c cues for techniques, handout provided.  Trial set performed of each for comprehension and symptom assessment.  See below for exercise list  PATIENT EDUCATION:  Education details: HEP, POC Person educated: Patient Education method: Explanation, Demonstration, Verbal cues, and Handouts Education comprehension: verbalized understanding, returned demonstration, and verbal cues required  HOME EXERCISE PROGRAM: Access Code: [email or web address] URL: [email or web address] Date: 03/12/2022 Prepared by: Jamey Reas  Exercises - Quad Setting and Stretching  - 2-4 x daily - 7 x weekly - 5-10 sets - 10 reps - prop 5-10 minutes & quad set5 seconds hold - Supine Leg Press  - 2-4 x daily - 7 x weekly - 5-10 sets - 10 reps - 5 seconds hold - Supine Heel Slide with Strap  - 2-3 x daily - 7 x weekly - 2-3 sets - 10 reps - 5 seconds hold - Supine Knee Extension Strengthening  - 2-3 x daily - 7 x weekly - 2-3 sets - 10 reps - 5 seconds hold - Seated Knee Flexion Extension AROM   - 2-4 x daily - 7 x weekly - 2-3 sets -  10 reps - 5 seconds hold - Seated Hamstring Stretch with Strap  - 2-4 x daily - 7 x weekly - 1 sets - 3 reps - 20-30 seconds hold  ASSESSMENT:  CLINICAL IMPRESSION: She is limited by pain today, has difficult time extending her leg. Worked to strengthen and stretch her leg as tolerated. She did not like the vasopnumatic machine last time so instead used some heat at the end of her session to her hamstrings to help with some of the pain and tightness. PT would not recommend any heat on her anterior knee yet.   OBJECTIVE IMPAIRMENTS: Abnormal gait, decreased balance, decreased mobility, difficulty walking, decreased ROM, decreased strength, increased edema, increased  muscle spasms, and pain.   ACTIVITY LIMITATIONS: carrying, lifting, standing, sleeping, stairs, transfers, and locomotion level  PARTICIPATION LIMITATIONS: meal prep, community activity, and church  PERSONAL FACTORS: Fitness, Time since onset of injury/illness/exacerbation, and 3+ comorbidities: see PMH  are also affecting patient's functional outcome.   REHAB POTENTIAL: Good  CLINICAL DECISION MAKING: Stable/uncomplicated  EVALUATION COMPLEXITY: Low   GOALS: Goals reviewed with patient? Yes  SHORT TERM GOALS: (target date for Short term goals are 3 weeks 03/30/2022)   1.  Patient will demonstrate independent use of home exercise program to maintain progress from in clinic treatments.  Goal status: New  LONG TERM GOALS: (target dates for all long term goals are 6 weeks  04/20/2022 )   1. Patient will demonstrate/report pain at worst less than or equal to 2/10 to facilitate minimal limitation in daily activity secondary to pain symptoms.  Goal status: New   2. Patient will demonstrate independent use of home exercise program to facilitate ability to maintain/progress functional gains from skilled physical therapy services.  Goal status: New   3. Patient will demonstrate FOTO outcome > or = 62 % to indicate reduced disability due to condition.  Goal status: New   4.  Right knee extension standing -5* AROM.   Goal status: New   5.  Patient ambulates >200' with prosthesis & LRAD modified independent. Goal status: New   6.  pt negotiates ramps & curbs with prosthesis & LRAD modified independent. Goal status: New     PLAN:  PT FREQUENCY: 2x/week  PT DURATION: 6 weeks  PLANNED INTERVENTIONS: Therapeutic exercises, Therapeutic activity, Neuro Muscular re-education, Balance training, Gait training, Patient/Family education, Joint mobilization, Stair training, DME instructions, Dry Needling, Electrical stimulation, Traction, Cryotherapy, vasopneumatic deviceMoist heat,  Taping, Ultrasound, Ionotophoresis '4mg'$ /ml Dexamethasone, and Manual therapy.  All included unless contraindicated  PLAN FOR NEXT SESSION: knee ROM and quad activation. Manual therapy & exercise for knee ROM especially extension.  Heat to hamstrings if desired, would not recommend any heat to the front of her knee as still has some swelling.   Debbe Odea, PT, DPT 03/16/2022, 10:36 AM

## 2022-03-20 ENCOUNTER — Ambulatory Visit (INDEPENDENT_AMBULATORY_CARE_PROVIDER_SITE_OTHER): Payer: BC Managed Care – PPO | Admitting: Physical Therapy

## 2022-03-20 ENCOUNTER — Encounter: Payer: Self-pay | Admitting: Physical Therapy

## 2022-03-20 DIAGNOSIS — M6281 Muscle weakness (generalized): Secondary | ICD-10-CM | POA: Diagnosis not present

## 2022-03-20 DIAGNOSIS — M25561 Pain in right knee: Secondary | ICD-10-CM

## 2022-03-20 DIAGNOSIS — R2681 Unsteadiness on feet: Secondary | ICD-10-CM

## 2022-03-20 DIAGNOSIS — R2689 Other abnormalities of gait and mobility: Secondary | ICD-10-CM | POA: Diagnosis not present

## 2022-03-20 DIAGNOSIS — M25661 Stiffness of right knee, not elsewhere classified: Secondary | ICD-10-CM | POA: Diagnosis not present

## 2022-03-20 DIAGNOSIS — R600 Localized edema: Secondary | ICD-10-CM

## 2022-03-20 NOTE — Therapy (Signed)
OUTPATIENT PHYSICAL THERAPY TREATMENT   Patient Name: Stacy Thomas MRN: [id number] DOB:1960/11/25, 61 y.o., female Today's Date: 03/20/2022  END OF SESSION:  PT End of Session - 03/20/22 1429     Visit Number 3    Number of Visits 12    Date for PT Re-Evaluation 04/20/22    Authorization Type BC[employer]    Authorization Time Period $150 COPAY, 17 PT VISITS REMAIN OUT OF 30 COMBINED PT/OT/CHIRO PER SERVICE YEAR    Authorization - Visit Number 3    Authorization - Number of Visits 17    Progress Note Due on Visit 10    PT Start Time 3875    PT Stop Time 1425    PT Time Calculation (min) 40 min    Equipment Utilized During Treatment Gait belt    Activity Tolerance Patient tolerated treatment well    Behavior During Therapy WFL for tasks assessed/performed              Past Medical History:  Diagnosis Date   Bilateral arm pain    chronic, due to use of crutches   Dependence on crutches    left leg prosthesis   Drug addiction in remission ([place])    since 2007  (crack cocaine,  IV drug use)   Hand weakness    Hepatic cirrhosis ([place]) 2018   followed by dr Havery Moros ([facility]) due to hx chronic hepatitis c   Hiatal hernia    History of esophageal stricture    post dilation x2   History of hepatitis C    pt completed harvoni treatment 2017, cured;  genotype 1a with fibrosis 3/4,  previously seen by dr comer   History of osteosarcoma    1982  left knee---  s/p  AKA LLE--   per pt no recurrence   History of recreational drug use multiple rehab visits   IV drug use for 1 yr and Smoked crack for 30+yrs--  per pt clean since 2007   Hyperlipidemia    Hypertension    Idiopathic peripheral neuropathy    OSA (obstructive sleep apnea)    study in epic 10-27-2018 by dr Rexene Alberts,  moderate osa,  pt stated used cpap until approx. 07/ 2021, intolerate of mask   Osteoarthritis    Pancreatic cyst    followed by dr Havery Moros--- per office note bening and stable   Sigmoid diverticulosis     mild   Wears glasses    Past Surgical History:  Procedure Laterality Date   ABOVE KNEE LEG AMPUTATION Left 1982   osteosarcoma   CARPAL TUNNEL RELEASE Right 10/18/2021   Procedure: RIGHT CARPAL TUNNEL RELEASE;  Surgeon: Leandrew Koyanagi, MD;  Location: [facility];  Service: Orthopedics;  Laterality: Right;   COLONOSCOPY WITH PROPOFOL  last one 04-28-2020  dr Havery Moros   ESOPHAGOGASTRODUODENOSCOPY (EGD) WITH PROPOFOL  last one 11-02-2019  dr Havery Moros   EXCISION BREAST BX  2015   CYST   EXCISION OF SKIN TAG N/A 07/14/2020   Procedure: EXCISION OF ANAL PAPILLA;  Surgeon: Leighton Ruff, MD;  Location[facility];  Service: General;  Laterality: N/A;   STERIOD INJECTION Left 10/18/2021   Procedure: LEFT CARPAL TUNNEL INJECTION;  Surgeon: Leandrew Koyanagi, MD;  Location: [place];  Service: Orthopedics;  Laterality: Left;   TONSILLECTOMY  as child   TOTAL KNEE ARTHROPLASTY Right 02/07/2022   Procedure: RIGHT TOTAL KNEE ARTHROPLASTY;  Surgeon: Newt Minion, MD;  Location: [place];  Service: Orthopedics;  Laterality: Right;   TRANSANAL HEMORRHOIDAL DEARTERIALIZATION N/A 10/27/2015   Procedure: TRANSANAL HEMORRHOIDAL DEARTERIALIZATION;  Surgeon: Michael Boston, MD;  Location: [place];  Service: General;  Laterality: N/A;   UPPER GASTROINTESTINAL ENDOSCOPY     Patient Active Problem List   Diagnosis Date Noted   Total knee replacement status, right 02/07/2022   Malaise and fatigue 12/05/2021   Prediabetes 02/23/2021   Hair loss 02/23/2021   Memory changes 03/01/2020   CKD (chronic kidney disease) 09/11/2019   HLD (hyperlipidemia) 09/09/2018   Allergic sinusitis 12/03/2017   Hx of AKA (above knee amputation), left ([place]) 02/18/2017   Unilateral primary osteoarthritis, right knee 02/18/2017   Vitamin D deficiency 05/04/2016   Bilateral primary osteoarthritis of hip 04/26/2016   Bilateral carpal tunnel syndrome 03/29/2016   Screening  for cervical cancer 03/06/2016   PVD (peripheral vascular disease) ([place]) 03/05/2016   Pancreatic abnormality 03/05/2016   Hypertension 03/05/2016   Hepatic cirrhosis ([place]) 11/15/2015   Chronic hepatitis C ([place]) 06/15/2015   Prolapsed internal hemorrhoids, grade 4 06/14/2015   Hx of osteosarcoma 06/14/2015    PCP: Sanjuan Dame, MD  REFERRING PROVIDER: Newt Minion, MD  REFERRING DIAG: [phone] (ICD-10-CM) - Status post total right knee replacement    THERAPY DIAG:  Stiffness of right knee, not elsewhere classified  Acute pain of right knee  Muscle weakness (generalized)  Other abnormalities of gait and mobility  Unsteadiness on feet  Localized edema  Rationale for Evaluation and Treatment: Rehabilitation  ONSET DATE: 02/07/22 TKA  SUBJECTIVE:   SUBJECTIVE STATEMENT: Pt arriving stating she has been relying on her Rt LE too much and it is extremely sore.   PERTINENT HISTORY: CKD, HLD, left AKA, osteosarcoma, PVD, HTN, hepatic cirrhosis, hep C  PAIN:  Are you having pain? Yes, 7-8/10 pain in Rt knee  PRECAUTIONS: Fall  WEIGHT BEARING RESTRICTIONS: No  FALLS:  Has patient fallen in last 6 months? No  LIVING ENVIRONMENT: Lives with: lives with their spouse Lives in: House Stairs: No Has following equipment at home: Environmental consultant - 2 wheeled, Health visitor, Radio broadcast assistant (power), Electronics engineer, and Grab bars  OCCUPATION: disabled  RECREATION: active in church  PLOF: Independent, Independent with household mobility without device, and Independent with community mobility without device  prosthesis only.    PATIENT GOALS: get back to prior level, work in yard, be active in church handing out meals to homeless.   NEXT MD VISIT: 04/05/2022  OBJECTIVE:   DIAGNOSTIC FINDINGS:   PATIENT SURVEYS:  FOTO intake:  40%  predicted:  62%  COGNITION: Overall cognitive status: WFL   EDEMA:  RLE: above knee 40.3cm,  around knee 40cm,  below knee 34.5cm  which appears to be  moderate edema LLE has AKA so unable to compare LEs  POSTURE: forward head and flexed trunk   PALPATION: Tenderness along right knee joint line and incision.  Tightness in ITB, quad, hamstring & gastroc.   LOWER EXTREMITY ROM:   ROM Right eval Rt 03/20/22  Hip flexion    Hip extension    Hip abduction    Hip adduction    Hip internal rotation    Hip external rotation    Knee flexion Seated P: 111* A: 107* Supine P: 118 A: 115  Knee extension Seated A: -20* P: -19*  A: -14 P: -10  Ankle dorsiflexion    Ankle plantarflexion    Ankle inversion    Ankle eversion     (Blank rows =  not tested)  LOWER EXTREMITY MMT:  MMT Right eval Left eval  Hip flexion    Hip extension    Hip abduction    Hip adduction    Hip internal rotation    Hip external rotation    Knee flexion 3-/5   Knee extension 3-/5   Ankle dorsiflexion    Ankle plantarflexion    Ankle inversion    Ankle eversion     (Blank rows = not tested)  FUNCTIONAL TESTS:  18 inch chair transfer: requires UE assist on armrests.   GAIT: Distance walked: 10' Assistive device utilized: left Transfemoral prosthesis & no AD Level of assistance: SBA Comments: right knee flexed in stance   TODAY'S TREATMENT                                                                          DATE:  03/20/2022 Therex: Nu step L5 UE/LE x 5 minutes Seated hamstring stretch 3 X 30 sec on Rt Seated SLR X 10 Supine heelslides X 15 Supine SAQ: x 15 holding 3 sec Amb with bil loft strand crutches for pt to use at home without prosthetic leg on her left LE.  Manual PROM: flexion/extension, passive hamstring stretch  Modalities:  Moist heat to Rt hamstring during SAQ     TODAY'S TREATMENT                                                                          DATE:  03/16/2022 Therex: Nu step L6 UE/LE for 3 min, then performed LE only at L3 for 2 more minutes then stopped at her request due to pain Seated hamstring  stretch 3 X 30 sec on Rt Seated quad set then moving back into knee flexion AROM X 10 Seated SLR X 10 Supine heelslides X 15 Supine quad sets X 5 (stopped at her request due to pain)  Manual therapy: Rt knee PROM to tolerance flexion and extension but more emphasis on extension, manual hamstring stretching 10 sec X 5 but limited by pain  Moist heat X 10 min to Rt hamstrings with legs elevated. (Not included in treatment time)       TODAY'S TREATMENT                                                                          DATE:  03/12/2022 Therex:    HEP instruction/performance c cues for techniques, handout provided.  Trial set performed of each for comprehension and symptom assessment.  See below for exercise list  PATIENT EDUCATION:  Education details: HEP, POC Person educated: Patient Education method: Explanation, Demonstration, Verbal cues, and Handouts Education comprehension: verbalized understanding, returned demonstration, and  verbal cues required  HOME EXERCISE PROGRAM: Access Code: [email or web address] URL: [email or web address] Date: 03/12/2022 Prepared by: Jamey Reas  Exercises - Quad Setting and Stretching  - 2-4 x daily - 7 x weekly - 5-10 sets - 10 reps - prop 5-10 minutes & quad set5 seconds hold - Supine Leg Press  - 2-4 x daily - 7 x weekly - 5-10 sets - 10 reps - 5 seconds hold - Supine Heel Slide with Strap  - 2-3 x daily - 7 x weekly - 2-3 sets - 10 reps - 5 seconds hold - Supine Knee Extension Strengthening  - 2-3 x daily - 7 x weekly - 2-3 sets - 10 reps - 5 seconds hold - Seated Knee Flexion Extension AROM   - 2-4 x daily - 7 x weekly - 2-3 sets - 10 reps - 5 seconds hold - Seated Hamstring Stretch with Strap  - 2-4 x daily - 7 x weekly - 1 sets - 3 reps - 20-30 seconds hold  ASSESSMENT:  CLINICAL IMPRESSION: Pt tolerating exercises better this visit per pt report. Treatment focusing on left LE strengthening as well as ROM. Pt's active left knee  flexion/extension have improved since last visit to arc of motion 14-115 degrees. Pt was issued a pair of loft strand crutches for home use which were fitted to pt's height. Pt also able to return demonstration with sequencing. Continue skilled PT to maximize pt's function.    OBJECTIVE IMPAIRMENTS: Abnormal gait, decreased balance, decreased mobility, difficulty walking, decreased ROM, decreased strength, increased edema, increased muscle spasms, and pain.   ACTIVITY LIMITATIONS: carrying, lifting, standing, sleeping, stairs, transfers, and locomotion level  PARTICIPATION LIMITATIONS: meal prep, community activity, and church  PERSONAL FACTORS: Fitness, Time since onset of injury/illness/exacerbation, and 3+ comorbidities: see PMH  are also affecting patient's functional outcome.   REHAB POTENTIAL: Good  CLINICAL DECISION MAKING: Stable/uncomplicated  EVALUATION COMPLEXITY: Low   GOALS: Goals reviewed with patient? Yes  SHORT TERM GOALS: (target date for Short term goals are 3 weeks 03/30/2022)   1.  Patient will demonstrate independent use of home exercise program to maintain progress from in clinic treatments.  Goal status: MET 03/20/22  LONG TERM GOALS: (target dates for all long term goals are 6 weeks  04/20/2022 )   1. Patient will demonstrate/report pain at worst less than or equal to 2/10 to facilitate minimal limitation in daily activity secondary to pain symptoms.  Goal status: New   2. Patient will demonstrate independent use of home exercise program to facilitate ability to maintain/progress functional gains from skilled physical therapy services.  Goal status: New   3. Patient will demonstrate FOTO outcome > or = 62 % to indicate reduced disability due to condition.  Goal status: New   4.  Right knee extension standing -5* AROM.   Goal status: New   5.  Patient ambulates >200' with prosthesis & LRAD modified independent. Goal status: New   6.  pt negotiates  ramps & curbs with prosthesis & LRAD modified independent. Goal status: New     PLAN:  PT FREQUENCY: 2x/week  PT DURATION: 6 weeks  PLANNED INTERVENTIONS: Therapeutic exercises, Therapeutic activity, Neuro Muscular re-education, Balance training, Gait training, Patient/Family education, Joint mobilization, Stair training, DME instructions, Dry Needling, Electrical stimulation, Traction, Cryotherapy, vasopneumatic deviceMoist heat, Taping, Ultrasound, Ionotophoresis 23m/ml Dexamethasone, and Manual therapy.  All included unless contraindicated  PLAN FOR NEXT SESSION: knee ROM and quad activation. Manual therapy & exercise for knee ROM especially extension.  Heat to hamstrings if desired, would not recommend any heat to the front of her knee as still has some swelling.   Kearney Hard, PT, MPT 03/20/22 2:31 PM   03/20/2022, 2:31 PM

## 2022-03-21 ENCOUNTER — Ambulatory Visit (INDEPENDENT_AMBULATORY_CARE_PROVIDER_SITE_OTHER): Payer: BC Managed Care – PPO | Admitting: Physical Therapy

## 2022-03-21 ENCOUNTER — Encounter: Payer: Self-pay | Admitting: Physical Therapy

## 2022-03-21 DIAGNOSIS — M25661 Stiffness of right knee, not elsewhere classified: Secondary | ICD-10-CM

## 2022-03-21 DIAGNOSIS — R2689 Other abnormalities of gait and mobility: Secondary | ICD-10-CM | POA: Diagnosis not present

## 2022-03-21 DIAGNOSIS — M25561 Pain in right knee: Secondary | ICD-10-CM

## 2022-03-21 DIAGNOSIS — M6281 Muscle weakness (generalized): Secondary | ICD-10-CM

## 2022-03-21 DIAGNOSIS — R2681 Unsteadiness on feet: Secondary | ICD-10-CM

## 2022-03-21 DIAGNOSIS — R600 Localized edema: Secondary | ICD-10-CM

## 2022-03-21 NOTE — Therapy (Signed)
OUTPATIENT PHYSICAL THERAPY TREATMENT   Patient Name: Stacy Thomas MRN: [id number] DOB:09-May-1960, 61 y.o., female Today's Date: 03/21/2022  END OF SESSION:  PT End of Session - 03/21/22 1349     Visit Number 4    Number of Visits 12    Date for PT Re-Evaluation 04/20/22    Authorization Type BC[employer]    Authorization Time Period $150 COPAY, 17 PT VISITS REMAIN OUT OF 30 COMBINED PT/OT/CHIRO PER SERVICE YEAR    Authorization - Visit Number 4    Authorization - Number of Visits 17    Progress Note Due on Visit 10    PT Start Time 9381    PT Stop Time 1425    PT Time Calculation (min) 40 min    Equipment Utilized During Treatment Gait belt    Activity Tolerance Patient tolerated treatment well    Behavior During Therapy WFL for tasks assessed/performed              Past Medical History:  Diagnosis Date   Bilateral arm pain    chronic, due to use of crutches   Dependence on crutches    left leg prosthesis   Drug addiction in remission ([place])    since 2007  (crack cocaine,  IV drug use)   Hand weakness    Hepatic cirrhosis ([place]) 2018   followed by dr Havery Moros ([facility]) due to hx chronic hepatitis c   Hiatal hernia    History of esophageal stricture    post dilation x2   History of hepatitis C    pt completed harvoni treatment 2017, cured;  genotype 1a with fibrosis 3/4,  previously seen by dr comer   History of osteosarcoma    1982  left knee---  s/p  AKA LLE--   per pt no recurrence   History of recreational drug use multiple rehab visits   IV drug use for 1 yr and Smoked crack for 30+yrs--  per pt clean since 2007   Hyperlipidemia    Hypertension    Idiopathic peripheral neuropathy    OSA (obstructive sleep apnea)    study in epic 10-27-2018 by dr Rexene Alberts,  moderate osa,  pt stated used cpap until approx. 07/ 2021, intolerate of mask   Osteoarthritis    Pancreatic cyst    followed by dr Havery Moros--- per office note bening and stable   Sigmoid diverticulosis     mild   Wears glasses    Past Surgical History:  Procedure Laterality Date   ABOVE KNEE LEG AMPUTATION Left 1982   osteosarcoma   CARPAL TUNNEL RELEASE Right 10/18/2021   Procedure: RIGHT CARPAL TUNNEL RELEASE;  Surgeon: Leandrew Koyanagi, MD;  Location: [facility];  Service: Orthopedics;  Laterality: Right;   COLONOSCOPY WITH PROPOFOL  last one 04-28-2020  dr Havery Moros   ESOPHAGOGASTRODUODENOSCOPY (EGD) WITH PROPOFOL  last one 11-02-2019  dr Havery Moros   EXCISION BREAST BX  2015   CYST   EXCISION OF SKIN TAG N/A 07/14/2020   Procedure: EXCISION OF ANAL PAPILLA;  Surgeon: Leighton Ruff, MD;  Location: [facility];  Service: General;  Laterality: N/A;   STERIOD INJECTION Left 10/18/2021   Procedure: LEFT CARPAL TUNNEL INJECTION;  Surgeon: Leandrew Koyanagi, MD;  Location: [facility];  Service: Orthopedics;  Laterality: Left;   TONSILLECTOMY  as child   TOTAL KNEE ARTHROPLASTY Right 02/07/2022   Procedure: RIGHT TOTAL KNEE ARTHROPLASTY;  Surgeon: Newt Minion, MD;  Location: [facility];  Service: Orthopedics;  Laterality: Right;   TRANSANAL HEMORRHOIDAL DEARTERIALIZATION N/A 10/27/2015   Procedure: TRANSANAL HEMORRHOIDAL DEARTERIALIZATION;  Surgeon: Michael Boston, MD;  Location: [facility];  Service: General;  Laterality: N/A;   UPPER GASTROINTESTINAL ENDOSCOPY     Patient Active Problem List   Diagnosis Date Noted   Total knee replacement status, right 02/07/2022   Malaise and fatigue 12/05/2021   Prediabetes 02/23/2021   Hair loss 02/23/2021   Memory changes 03/01/2020   CKD (chronic kidney disease) 09/11/2019   HLD (hyperlipidemia) 09/09/2018   Allergic sinusitis 12/03/2017   Hx of AKA (above knee amputation), left ([place]) 02/18/2017   Unilateral primary osteoarthritis, right knee 02/18/2017   Vitamin D deficiency 05/04/2016   Bilateral primary osteoarthritis of hip 04/26/2016   Bilateral carpal tunnel syndrome 03/29/2016   Screening  for cervical cancer 03/06/2016   PVD (peripheral vascular disease) ([place]) 03/05/2016   Pancreatic abnormality 03/05/2016   Hypertension 03/05/2016   Hepatic cirrhosis ([place]) 11/15/2015   Chronic hepatitis C ([place]) 06/15/2015   Prolapsed internal hemorrhoids, grade 4 06/14/2015   Hx of osteosarcoma 06/14/2015    PCP: Sanjuan Dame, MD  REFERRING PROVIDER: Newt Minion, MD  REFERRING DIAG: [phone] (ICD-10-CM) - Status post total right knee replacement    THERAPY DIAG:  Stiffness of right knee, not elsewhere classified  Acute pain of right knee  Muscle weakness (generalized)  Other abnormalities of gait and mobility  Unsteadiness on feet  Localized edema  Rationale for Evaluation and Treatment: Rehabilitation  ONSET DATE: 02/07/22 TKA  SUBJECTIVE:   SUBJECTIVE STATEMENT: She has been doing her exercises.   PERTINENT HISTORY: CKD, HLD, left AKA, osteosarcoma, PVD, HTN, hepatic cirrhosis, hep C  PAIN:  Are you having pain? Yes,  today 7/10 pain in Rt knee, in last week lowest 7/10 and highest 8.5/10 Sore Aggravating factors: walking on it Alleviating factors: getting off leg, tylenol, heating pads.  PRECAUTIONS: Fall  WEIGHT BEARING RESTRICTIONS: No  FALLS:  Has patient fallen in last 6 months? No  LIVING ENVIRONMENT: Lives with: lives with their spouse Lives in: House Stairs: No Has following equipment at home: Environmental consultant - 2 wheeled, Health visitor, Radio broadcast assistant (power), Electronics engineer, and Grab bars  OCCUPATION: disabled  RECREATION: active in church  PLOF: Independent, Independent with household mobility without device, and Independent with community mobility without device  prosthesis only.    PATIENT GOALS: get back to prior level, work in yard, be active in church handing out meals to homeless.   NEXT MD VISIT: 04/05/2022  OBJECTIVE:   DIAGNOSTIC FINDINGS:   PATIENT SURVEYS:  FOTO intake:  40%  predicted:  62%  COGNITION: Overall cognitive status:  WFL   EDEMA:  RLE: above knee 40.3cm,  around knee 40cm,  below knee 34.5cm  which appears to be moderate edema LLE has AKA so unable to compare LEs  POSTURE: forward head and flexed trunk   PALPATION: Tenderness along right knee joint line and incision.  Tightness in ITB, quad, hamstring & gastroc.   LOWER EXTREMITY ROM:   ROM Right eval Rt 03/20/22  Hip flexion    Hip extension    Hip abduction    Hip adduction    Hip internal rotation    Hip external rotation    Knee flexion Seated P: 111* A: 107* Supine P: 118 A: 115  Knee extension Seated A: -20* P: -19*  A: -14 P: -10  Ankle dorsiflexion    Ankle plantarflexion    Ankle inversion  Ankle eversion     (Blank rows = not tested)  LOWER EXTREMITY MMT:  MMT Right eval Left eval  Hip flexion    Hip extension    Hip abduction    Hip adduction    Hip internal rotation    Hip external rotation    Knee flexion 3-/5   Knee extension 3-/5   Ankle dorsiflexion    Ankle plantarflexion    Ankle inversion    Ankle eversion     (Blank rows = not tested)  FUNCTIONAL TESTS:  18 inch chair transfer: requires UE assist on armrests.   GAIT: Distance walked: 10' Assistive device utilized: left Transfemoral prosthesis & no AD Level of assistance: SBA Comments: right knee flexed in stance   TODAY'S TREATMENT                                                                          DATE:  03/21/2022: Therex: Nu step L6 RUE/RLE x 5 minutes, seat at 5 initially then 6 to facilitate extension Seated hamstring stretch 3 X 30 sec on Rt Standing gastroc stretch forefoot on folded towel with RW support 30 sec hold X 3 Supine right hip flexor stretch hooklying supine with RLE over edge 30 sec X 3 Supine SLR X 10 Supine leg press into PT hand RLE 10 reps. Supine SAQ: x 15 holding 3 sec Seated RLE LAQ active for max ext, then PT manual assist for >ROM, isometric hold & controlled eccentric 6 reps Standing TKE red  theraband with RW support 10 reps 3 sec hold PT adjusted pt RW up 1 notch to facilitate more upright posture with knee ext.   Manual PROM: flexion/extension, passive hamstring stretch    03/20/2022 Therex: Nu step L5 UE/LE x 5 minutes Seated hamstring stretch 3 X 30 sec on Rt Seated SLR X 10 Supine heelslides X 15 Supine SAQ: x 15 holding 3 sec Amb with bil loft strand crutches for pt to use at home without prosthetic leg on her left LE.  Manual PROM: flexion/extension, passive hamstring stretch  Modalities:  Moist heat to Rt hamstring during SAQ   03/16/2022 Therex: Nu step L6 UE/LE for 3 min, then performed LE only at L3 for 2 more minutes then stopped at her request due to pain Seated hamstring stretch 3 X 30 sec on Rt Seated quad set then moving back into knee flexion AROM X 10 Seated SLR X 10 Supine heelslides X 15 Supine quad sets X 5 (stopped at her request due to pain)  Manual therapy: Rt knee PROM to tolerance flexion and extension but more emphasis on extension, manual hamstring stretching 10 sec X 5 but limited by pain  Moist heat X 10 min to Rt hamstrings with legs elevated. (Not included in treatment time)   PATIENT EDUCATION:  Education details: HEP, POC Person educated: Patient Education method: Explanation, Demonstration, Verbal cues, and Handouts Education comprehension: verbalized understanding, returned demonstration, and verbal cues required  HOME EXERCISE PROGRAM: Access Code: [email or web address] URL: [email or web address] Date: 03/12/2022 Prepared by: Jamey Reas  Exercises - Quad Setting and Stretching  - 2-4 x daily - 7 x weekly - 5-10 sets - 10 reps - prop 5-10 minutes & quad set5  seconds hold - Supine Leg Press  - 2-4 x daily - 7 x weekly - 5-10 sets - 10 reps - 5 seconds hold - Supine Heel Slide with Strap  - 2-3 x daily - 7 x weekly - 2-3 sets - 10 reps - 5 seconds hold - Supine Knee Extension Strengthening  - 2-3 x daily - 7 x weekly  - 2-3 sets - 10 reps - 5 seconds hold - Seated Knee Flexion Extension AROM   - 2-4 x daily - 7 x weekly - 2-3 sets - 10 reps - 5 seconds hold - Seated Hamstring Stretch with Strap  - 2-4 x daily - 7 x weekly - 1 sets - 3 reps - 20-30 seconds hold  ASSESSMENT:  CLINICAL IMPRESSION: Patient's knee extension limits her more than flexion.  PT worked on exercises to facilitate functional extension including stretches for muscles and strengthening.     OBJECTIVE IMPAIRMENTS: Abnormal gait, decreased balance, decreased mobility, difficulty walking, decreased ROM, decreased strength, increased edema, increased muscle spasms, and pain.   ACTIVITY LIMITATIONS: carrying, lifting, standing, sleeping, stairs, transfers, and locomotion level  PARTICIPATION LIMITATIONS: meal prep, community activity, and church  PERSONAL FACTORS: Fitness, Time since onset of injury/illness/exacerbation, and 3+ comorbidities: see PMH  are also affecting patient's functional outcome.   REHAB POTENTIAL: Good  CLINICAL DECISION MAKING: Stable/uncomplicated  EVALUATION COMPLEXITY: Low   GOALS: Goals reviewed with patient? Yes  SHORT TERM GOALS: (target date for Short term goals are 3 weeks 03/30/2022)   1.  Patient will demonstrate independent use of home exercise program to maintain progress from in clinic treatments.  Goal status: MET 03/20/22  LONG TERM GOALS: (target dates for all long term goals are 6 weeks  04/20/2022 )   1. Patient will demonstrate/report pain at worst less than or equal to 2/10 to facilitate minimal limitation in daily activity secondary to pain symptoms.  Goal status: New   2. Patient will demonstrate independent use of home exercise program to facilitate ability to maintain/progress functional gains from skilled physical therapy services.  Goal status: New   3. Patient will demonstrate FOTO outcome > or = 62 % to indicate reduced disability due to condition.  Goal status: New    4.  Right knee extension standing -5* AROM.   Goal status: New   5.  Patient ambulates >200' with prosthesis & LRAD modified independent. Goal status: New   6.  pt negotiates ramps & curbs with prosthesis & LRAD modified independent. Goal status: New     PLAN:  PT FREQUENCY: 2x/week  PT DURATION: 6 weeks  PLANNED INTERVENTIONS: Therapeutic exercises, Therapeutic activity, Neuro Muscular re-education, Balance training, Gait training, Patient/Family education, Joint mobilization, Stair training, DME instructions, Dry Needling, Electrical stimulation, Traction, Cryotherapy, vasopneumatic deviceMoist heat, Taping, Ultrasound, Ionotophoresis 76m/ml Dexamethasone, and Manual therapy.  All included unless contraindicated  PLAN FOR NEXT SESSION: continue with knee ROM and quad activation. Manual therapy & exercise for knee ROM especially extension. Add standing balance activities.  Heat to hamstrings if desired, would not recommend any heat to the front of her knee as still has some swelling.    RJamey Reas PT, DPT 03/21/2022, 3:06 PM

## 2022-03-28 ENCOUNTER — Ambulatory Visit (INDEPENDENT_AMBULATORY_CARE_PROVIDER_SITE_OTHER): Payer: BC Managed Care – PPO | Admitting: Physical Therapy

## 2022-03-28 ENCOUNTER — Encounter: Payer: Self-pay | Admitting: Physical Therapy

## 2022-03-28 DIAGNOSIS — R2689 Other abnormalities of gait and mobility: Secondary | ICD-10-CM

## 2022-03-28 DIAGNOSIS — M6281 Muscle weakness (generalized): Secondary | ICD-10-CM

## 2022-03-28 DIAGNOSIS — M25561 Pain in right knee: Secondary | ICD-10-CM | POA: Diagnosis not present

## 2022-03-28 DIAGNOSIS — R600 Localized edema: Secondary | ICD-10-CM

## 2022-03-28 DIAGNOSIS — M25661 Stiffness of right knee, not elsewhere classified: Secondary | ICD-10-CM

## 2022-03-28 DIAGNOSIS — R2681 Unsteadiness on feet: Secondary | ICD-10-CM

## 2022-03-28 NOTE — Therapy (Signed)
OUTPATIENT PHYSICAL THERAPY TREATMENT   Patient Name: Stacy Thomas MRN: [id number] DOB:[birth year], 61 y.o., female Today's Date: 03/28/2022  END OF SESSION:  PT End of Session - 03/28/22 1428     Visit Number 5    Number of Visits 12    Date for PT Re-Evaluation 04/20/22    Authorization Type BCBS    Authorization Time Period $150 COPAY, 17 PT VISITS REMAIN OUT OF 30 COMBINED PT/OT/CHIRO PER SERVICE YEAR    Authorization - Number of Visits 17    Progress Note Due on Visit 10    PT Start Time 1429    PT Stop Time 1513    PT Time Calculation (min) 44 min    Equipment Utilized During Treatment Gait belt    Activity Tolerance Patient tolerated treatment well    Behavior During Therapy WFL for tasks assessed/performed              Past Medical History:  Diagnosis Date   Bilateral arm pain    chronic, due to use of crutches   Dependence on crutches    left leg prosthesis   Drug addiction in remission ([place])    since 2007  (crack cocaine,  IV drug use)   Hand weakness    Hepatic cirrhosis ([place]) 2018   followed by dr Havery Moros ([facility]) due to hx chronic hepatitis c   Hiatal hernia    History of esophageal stricture    post dilation x2   History of hepatitis C    pt completed harvoni treatment 2017, cured;  genotype 1a with fibrosis 3/4,  previously seen by dr comer   History of osteosarcoma    1982  left knee---  s/p  AKA LLE--   per pt no recurrence   History of recreational drug use multiple rehab visits   IV drug use for 1 yr and Smoked crack for 30+yrs--  per pt clean since 2007   Hyperlipidemia    Hypertension    Idiopathic peripheral neuropathy    OSA (obstructive sleep apnea)    study in epic 10-27-2018 by dr Rexene Alberts,  moderate osa,  pt stated used cpap until approx. 07/ 2021, intolerate of mask   Osteoarthritis    Pancreatic cyst    followed by dr Havery Moros--- per office note bening and stable   Sigmoid diverticulosis    mild   Wears glasses    Past  Surgical History:  Procedure Laterality Date   ABOVE KNEE LEG AMPUTATION Left 1982   osteosarcoma   CARPAL TUNNEL RELEASE Right 10/18/2021   Procedure: RIGHT CARPAL TUNNEL RELEASE;  Surgeon: Leandrew Koyanagi, MD;  Location: [facility];  Service: Orthopedics;  Laterality: Right;   COLONOSCOPY WITH PROPOFOL  last one 04-28-2020  dr Havery Moros   ESOPHAGOGASTRODUODENOSCOPY (EGD) WITH PROPOFOL  last one 11-02-2019  dr Havery Moros   EXCISION BREAST BX  2015   CYST   EXCISION OF SKIN TAG N/A 07/14/2020   Procedure: EXCISION OF ANAL PAPILLA;  Surgeon: Leighton Ruff, MD;  Location: [facility];  Service: General;  Laterality: N/A;   STERIOD INJECTION Left 10/18/2021   Procedure: LEFT CARPAL TUNNEL INJECTION;  Surgeon: Leandrew Koyanagi, MD;  Location: [facility];  Service: Orthopedics;  Laterality: Left;   TONSILLECTOMY  as child   TOTAL KNEE ARTHROPLASTY Right 02/07/2022   Procedure: RIGHT TOTAL KNEE ARTHROPLASTY;  Surgeon: Newt Minion, MD;  Location: [facility];  Service: Orthopedics;  Laterality: Right;  TRANSANAL HEMORRHOIDAL DEARTERIALIZATION N/A 10/27/2015   Procedure: TRANSANAL HEMORRHOIDAL DEARTERIALIZATION;  Surgeon: Michael Boston, MD;  Location: [facility];  Service: General;  Laterality: N/A;   UPPER GASTROINTESTINAL ENDOSCOPY     Patient Active Problem List   Diagnosis Date Noted   Total knee replacement status, right 02/07/2022   Malaise and fatigue 12/05/2021   Prediabetes 02/23/2021   Hair loss 02/23/2021   Memory changes 03/01/2020   CKD (chronic kidney disease) 09/11/2019   HLD (hyperlipidemia) 09/09/2018   Allergic sinusitis 12/03/2017   Hx of AKA (above knee amputation), left ([place]) 02/18/2017   Unilateral primary osteoarthritis, right knee 02/18/2017   Vitamin D deficiency 05/04/2016   Bilateral primary osteoarthritis of hip 04/26/2016   Bilateral carpal tunnel syndrome 03/29/2016   Screening for cervical cancer 03/06/2016    PVD (peripheral vascular disease) ([place]) 03/05/2016   Pancreatic abnormality 03/05/2016   Hypertension 03/05/2016   Hepatic cirrhosis ([place]) 11/15/2015   Chronic hepatitis C ([place]) 06/15/2015   Prolapsed internal hemorrhoids, grade 4 06/14/2015   Hx of osteosarcoma 06/14/2015    PCP: Sanjuan Dame, MD  REFERRING PROVIDER: Newt Minion, MD  REFERRING DIAG: [phone] (ICD-10-CM) - Status post total right knee replacement    THERAPY DIAG:  Stiffness of right knee, not elsewhere classified  Acute pain of right knee  Muscle weakness (generalized)  Other abnormalities of gait and mobility  Unsteadiness on feet  Localized edema  Rationale for Evaluation and Treatment: Rehabilitation  ONSET DATE: 02/07/22 TKA  SUBJECTIVE:   SUBJECTIVE STATEMENT: She has been walking a lot to get around hospital visiting her brother since last Thursday.  Her right leg is sore & tired.   PERTINENT HISTORY: left AKA, osteosarcoma, PVD, HTN, hepatic cirrhosis, hep C, CKD, HLD,   PAIN:  Are you having pain? Yes,  today 9/10 pain in Rt knee, in last week lowest 7/10 and highest 9/10 Sore Aggravating factors: walking on it Alleviating factors: getting off leg, tylenol, heating pads.  PRECAUTIONS: Fall  WEIGHT BEARING RESTRICTIONS: No  FALLS:  Has patient fallen in last 6 months? No  LIVING ENVIRONMENT: Lives with: lives with their spouse Lives in: House Stairs: No Has following equipment at home: Environmental consultant - 2 wheeled, Health visitor, Radio broadcast assistant (power), Electronics engineer, and Grab bars  OCCUPATION: disabled  RECREATION: active in church  PLOF: Independent, Independent with household mobility without device, and Independent with community mobility without device  prosthesis only.    PATIENT GOALS: get back to prior level, work in yard, be active in church handing out meals to homeless.   NEXT MD VISIT: 04/05/2022  OBJECTIVE:   DIAGNOSTIC FINDINGS:   PATIENT SURVEYS:  FOTO intake:  40%   predicted:  62%  COGNITION: Overall cognitive status: WFL   EDEMA:  RLE: above knee 40.3cm,  around knee 40cm,  below knee 34.5cm  which appears to be moderate edema LLE has AKA so unable to compare LEs  POSTURE: forward head and flexed trunk   PALPATION: Tenderness along right knee joint line and incision.  Tightness in ITB, quad, hamstring & gastroc.   LOWER EXTREMITY ROM:   ROM Right eval Rt 03/20/22 Right 03/28/22  Hip flexion     Hip extension     Hip abduction     Hip adduction     Hip internal rotation     Hip external rotation     Knee flexion Seated P: 111* A: 107* Supine P: 118 A: 115 Seated A: 112*  Knee extension  Seated P: -20* A: -19*  supine A: -14* P: -10* Seated  A: LAQ -17* P: -12*  Ankle dorsiflexion     Ankle plantarflexion     Ankle inversion     Ankle eversion      (Blank rows = not tested)  LOWER EXTREMITY MMT:  MMT Right eval Left eval  Hip flexion    Hip extension    Hip abduction    Hip adduction    Hip internal rotation    Hip external rotation    Knee flexion 3-/5   Knee extension 3-/5   Ankle dorsiflexion    Ankle plantarflexion    Ankle inversion    Ankle eversion     (Blank rows = not tested)  FUNCTIONAL TESTS:  18 inch chair transfer: requires UE assist on armrests.   GAIT: Distance walked: 10' Assistive device utilized: left Transfemoral prosthesis & no AD Level of assistance: SBA Comments: right knee flexed in stance   TODAY'S TREATMENT                                                                          DATE:  03/28/2022: Therex: Nu step L6 RUE/RLE x 5 minutes, seat at 6 initially then 7 to facilitate extension Seated RLE LAQ active for max ext, then PT manual assist for >ROM, isometric hold & controlled eccentric 10 reps 2 sets Supine SAQ: x 10 reps 2 sets holding concentric, then manual leg press to engage quads, isometric hold 3 sec, & controlled eccentric Supine heel slide AA from PT for fuller  ROM and with strap & lower leg on ball knee flexion 5 sec hold & ext 5 sec hold Supine right hip flexor stretch hooklying supine with RLE over edge 30 sec X 3 Supine quad set 3 sec hold 10 reps 2 sets with tactile cues for quad activation. Standing TKE red theraband with RW support 10 reps 3 sec hold Standing gastroc stretch forefoot on folded towel with RW support 30 sec hold X 3   Manual PROM: flexion/extension, passive hamstring stretch  Soft tissue work to distal hamstrings & posterior knee  Hot pack to hamstring & posterior knee X 6 min   03/21/2022: Therex: Nu step L6 RUE/RLE x 5 minutes, seat at 5 initially then 6 to facilitate extension Seated hamstring stretch 3 X 30 sec on Rt Standing gastroc stretch forefoot on folded towel with RW support 30 sec hold X 3 Supine right hip flexor stretch hooklying supine with RLE over edge 30 sec X 3 Supine SLR X 10 Supine leg press into PT hand RLE 10 reps. Supine SAQ: x 15 holding 3 sec Seated RLE LAQ active for max ext, then PT manual assist for >ROM, isometric hold & controlled eccentric 6 reps Standing TKE red theraband with RW support 10 reps 3 sec hold PT adjusted pt RW up 1 notch to facilitate more upright posture with knee ext.   Manual PROM: flexion/extension, passive hamstring stretch    03/20/2022 Therex: Nu step L5 UE/LE x 5 minutes Seated hamstring stretch 3 X 30 sec on Rt Seated SLR X 10 Supine heelslides X 15 Supine SAQ: x 15 holding 3 sec Amb with bil loft strand crutches for  pt to use at home without prosthetic leg on her left LE.  Manual PROM: flexion/extension, passive hamstring stretch  Modalities:  Moist heat to Rt hamstring during SAQ    PATIENT EDUCATION:  Education details: HEP, POC Person educated: Patient Education method: Consulting civil engineer, Demonstration, Verbal cues, and Handouts Education comprehension: verbalized understanding, returned demonstration, and verbal cues required  HOME EXERCISE  PROGRAM: Access Code: [email or web address] URL: [email or web address] Date: 03/12/2022 Prepared by: Jamey Reas  Exercises - Quad Setting and Stretching  - 2-4 x daily - 7 x weekly - 5-10 sets - 10 reps - prop 5-10 minutes & quad set5 seconds hold - Supine Leg Press  - 2-4 x daily - 7 x weekly - 5-10 sets - 10 reps - 5 seconds hold - Supine Heel Slide with Strap  - 2-3 x daily - 7 x weekly - 2-3 sets - 10 reps - 5 seconds hold - Supine Knee Extension Strengthening  - 2-3 x daily - 7 x weekly - 2-3 sets - 10 reps - 5 seconds hold - Seated Knee Flexion Extension AROM   - 2-4 x daily - 7 x weekly - 2-3 sets - 10 reps - 5 seconds hold - Seated Hamstring Stretch with Strap  - 2-4 x daily - 7 x weekly - 1 sets - 3 reps - 20-30 seconds hold  ASSESSMENT:  CLINICAL IMPRESSION: Extension continues to limited passively & actively but is slowly improving.  Pt has increased walking out of PT due to family issue and her knee pain has increased.  Pt responds well to soft tissue work over manual stretches.    OBJECTIVE IMPAIRMENTS: Abnormal gait, decreased balance, decreased mobility, difficulty walking, decreased ROM, decreased strength, increased edema, increased muscle spasms, and pain.   ACTIVITY LIMITATIONS: carrying, lifting, standing, sleeping, stairs, transfers, and locomotion level  PARTICIPATION LIMITATIONS: meal prep, community activity, and church  PERSONAL FACTORS: Fitness, Time since onset of injury/illness/exacerbation, and 3+ comorbidities: see PMH  are also affecting patient's functional outcome.   REHAB POTENTIAL: Good  CLINICAL DECISION MAKING: Stable/uncomplicated  EVALUATION COMPLEXITY: Low   GOALS: Goals reviewed with patient? Yes  SHORT TERM GOALS: (target date for Short term goals are 3 weeks 03/30/2022)   1.  Patient will demonstrate independent use of home exercise program to maintain progress from in clinic treatments.  Goal status: MET 03/20/22  LONG TERM  GOALS: (target dates for all long term goals are 6 weeks  04/20/2022 )   1. Patient will demonstrate/report pain at worst less than or equal to 2/10 to facilitate minimal limitation in daily activity secondary to pain symptoms.  Goal status: New   2. Patient will demonstrate independent use of home exercise program to facilitate ability to maintain/progress functional gains from skilled physical therapy services.  Goal status: New   3. Patient will demonstrate FOTO outcome > or = 62 % to indicate reduced disability due to condition.  Goal status: New   4.  Right knee extension standing -5* AROM.   Goal status: New   5.  Patient ambulates >200' with prosthesis & LRAD modified independent. Goal status: New   6.  pt negotiates ramps & curbs with prosthesis & LRAD modified independent. Goal status: New     PLAN:  PT FREQUENCY: 2x/week  PT DURATION: 6 weeks  PLANNED INTERVENTIONS: Therapeutic exercises, Therapeutic activity, Neuro Muscular re-education, Balance training, Gait training, Patient/Family education, Joint mobilization, Stair training, DME instructions, Dry Needling, Electrical stimulation, Traction, Cryotherapy, vasopneumatic deviceMoist heat, Taping, Ultrasound, Ionotophoresis  93m/ml Dexamethasone, and Manual therapy.  All included unless contraindicated  PLAN FOR NEXT SESSION:  continue with knee ROM and quad activation. Manual therapy especially soft tissue mobs & exercise for knee ROM especially extension. Heat to hamstrings if desired, would not recommend any heat to the front of her knee as still has some swelling.    RJamey Reas PT, DPT 03/28/2022, 3:27 PM

## 2022-03-29 NOTE — Therapy (Signed)
OUTPATIENT PHYSICAL THERAPY TREATMENT   Patient Name: Stacy Thomas MRN: [id number] DOB:January 25, 1961, 61 y.o., female Today's Date: 03/30/2022  END OF SESSION:  PT End of Session - 03/30/22 1347     Visit Number 6    Number of Visits 12    Date for PT Re-Evaluation 04/20/22    Authorization Type BCBS    Authorization Time Period $150 COPAY, 17 PT VISITS REMAIN OUT OF 30 COMBINED PT/OT/CHIRO PER SERVICE YEAR    Authorization - Visit Number 6    Authorization - Number of Visits 17    Progress Note Due on Visit 10    PT Start Time 1347    PT Stop Time 0321    PT Time Calculation (min) 41 min    Activity Tolerance Patient tolerated treatment well    Behavior During Therapy WFL for tasks assessed/performed               Past Medical History:  Diagnosis Date   Bilateral arm pain    chronic, due to use of crutches   Dependence on crutches    left leg prosthesis   Drug addiction in remission ([place])    since 2007  (crack cocaine,  IV drug use)   Hand weakness    Hepatic cirrhosis ([place]) 2018   followed by dr Havery Moros (gi) due to hx chronic hepatitis c   Hiatal hernia    History of esophageal stricture    post dilation x2   History of hepatitis C    pt completed harvoni treatment 2017, cured;  genotype 1a with fibrosis 3/4,  previously seen by dr comer   History of osteosarcoma    1982  left knee---  s/p  AKA LLE--   per pt no recurrence   History of recreational drug use multiple rehab visits   IV drug use for 1 yr and Smoked crack for 30+yrs--  per pt clean since 2007   Hyperlipidemia    Hypertension    Idiopathic peripheral neuropathy    OSA (obstructive sleep apnea)    study in epic 10-27-2018 by dr Rexene Alberts,  moderate osa,  pt stated used cpap until approx. 07/ 2021, intolerate of mask   Osteoarthritis    Pancreatic cyst    followed by dr Havery Moros--- per office note bening and stable   Sigmoid diverticulosis    mild   Wears glasses    Past Surgical History:   Procedure Laterality Date   ABOVE KNEE LEG AMPUTATION Left 1982   osteosarcoma   CARPAL TUNNEL RELEASE Right 10/18/2021   Procedure: RIGHT CARPAL TUNNEL RELEASE;  Surgeon: Leandrew Koyanagi, MD;  Location: [place];  Service: Orthopedics;  Laterality: Right;   COLONOSCOPY WITH PROPOFOL  last one 04-28-2020  dr Havery Moros   ESOPHAGOGASTRODUODENOSCOPY (EGD) WITH PROPOFOL  last one 11-02-2019  dr Havery Moros   EXCISION BREAST BX  2015   CYST   EXCISION OF SKIN TAG N/A 07/14/2020   Procedure: EXCISION OF ANAL PAPILLA;  Surgeon: Leighton Ruff, MD;  Location: [facility];  Service: General;  Laterality: N/A;   STERIOD INJECTION Left 10/18/2021   Procedure: LEFT CARPAL TUNNEL INJECTION;  Surgeon: Leandrew Koyanagi, MD;  Location: [place];  Service: Orthopedics;  Laterality: Left;   TONSILLECTOMY  as child   TOTAL KNEE ARTHROPLASTY Right 02/07/2022   Procedure: RIGHT TOTAL KNEE ARTHROPLASTY;  Surgeon: Newt Minion, MD;  Location: [place];  Service: Orthopedics;  Laterality: Right;  TRANSANAL HEMORRHOIDAL DEARTERIALIZATION N/A 10/27/2015   Procedure: TRANSANAL HEMORRHOIDAL DEARTERIALIZATION;  Surgeon: Michael Boston, MD;  Location: [facility];  Service: General;  Laterality: N/A;   UPPER GASTROINTESTINAL ENDOSCOPY     Patient Active Problem List   Diagnosis Date Noted   Total knee replacement status, right 02/07/2022   Malaise and fatigue 12/05/2021   Prediabetes 02/23/2021   Hair loss 02/23/2021   Memory changes 03/01/2020   CKD (chronic kidney disease) 09/11/2019   HLD (hyperlipidemia) 09/09/2018   Allergic sinusitis 12/03/2017   Hx of AKA (above knee amputation), left ([place]) 02/18/2017   Unilateral primary osteoarthritis, right knee 02/18/2017   Vitamin D deficiency 05/04/2016   Bilateral primary osteoarthritis of hip 04/26/2016   Bilateral carpal tunnel syndrome 03/29/2016   Screening for cervical cancer 03/06/2016   PVD (peripheral  vascular disease) ([place]) 03/05/2016   Pancreatic abnormality 03/05/2016   Hypertension 03/05/2016   Hepatic cirrhosis ([place]) 11/15/2015   Chronic hepatitis C ([place]) 06/15/2015   Prolapsed internal hemorrhoids, grade 4 06/14/2015   Hx of osteosarcoma 06/14/2015    PCP: Sanjuan Dame, MD  REFERRING PROVIDER: Newt Minion, MD  REFERRING DIAG: [phone] (ICD-10-CM) - Status post total right knee replacement    THERAPY DIAG:  Stiffness of right knee, not elsewhere classified  Acute pain of right knee  Muscle weakness (generalized)  Other abnormalities of gait and mobility  Unsteadiness on feet  Localized edema  Rationale for Evaluation and Treatment: Rehabilitation  ONSET DATE: 02/07/22 TKA  SUBJECTIVE:   SUBJECTIVE STATEMENT: 8/10 pain at present, soreness after last session, increased pain with walking around hospital. Has been doing HEP    PERTINENT HISTORY: left AKA, osteosarcoma, PVD, HTN, hepatic cirrhosis, hep C, CKD, HLD,   PAIN:  Are you having pain? Yes,  today 8/10 pain in Rt knee, in last week lowest 7/10 and highest 9/10 Sore Aggravating factors: walking on it Alleviating factors: getting off leg, tylenol, heating pads.  PRECAUTIONS: Fall  WEIGHT BEARING RESTRICTIONS: No  FALLS:  Has patient fallen in last 6 months? No  LIVING ENVIRONMENT: Lives with: lives with their spouse Lives in: House Stairs: No Has following equipment at home: Environmental consultant - 2 wheeled, Health visitor, Radio broadcast assistant (power), Electronics engineer, and Grab bars  OCCUPATION: disabled  RECREATION: active in church  PLOF: Independent, Independent with household mobility without device, and Independent with community mobility without device  prosthesis only.    PATIENT GOALS: get back to prior level, work in yard, be active in church handing out meals to homeless.   NEXT MD VISIT: 04/05/2022  OBJECTIVE:   DIAGNOSTIC FINDINGS:   PATIENT SURVEYS:  FOTO intake:  40%  predicted:   62%  COGNITION: Overall cognitive status: WFL   EDEMA:  RLE: above knee 40.3cm,  around knee 40cm,  below knee 34.5cm  which appears to be moderate edema LLE has AKA so unable to compare LEs  POSTURE: forward head and flexed trunk   PALPATION: Tenderness along right knee joint line and incision.  Tightness in ITB, quad, hamstring & gastroc.   LOWER EXTREMITY ROM:   ROM Right eval Rt 03/20/22 Right 03/28/22  Hip flexion     Hip extension     Hip abduction     Hip adduction     Hip internal rotation     Hip external rotation     Knee flexion Seated P: 111* A: 107* Supine P: 118 A: 115 Seated A: 112*  Knee extension Seated P: -20* A: -19*  supine A: -14* P: -10* Seated  A: LAQ -17* P: -12*  Ankle dorsiflexion     Ankle plantarflexion     Ankle inversion     Ankle eversion      (Blank rows = not tested)  LOWER EXTREMITY MMT:  MMT Right eval Left eval  Hip flexion    Hip extension    Hip abduction    Hip adduction    Hip internal rotation    Hip external rotation    Knee flexion 3-/5   Knee extension 3-/5   Ankle dorsiflexion    Ankle plantarflexion    Ankle inversion    Ankle eversion     (Blank rows = not tested)  FUNCTIONAL TESTS:  18 inch chair transfer: requires UE assist on armrests.   GAIT: Distance walked: 10' Assistive device utilized: left Transfemoral prosthesis & no AD Level of assistance: SBA Comments: right knee flexed in stance   TODAY'S TREATMENT                                                                           [facility] Adult PT Treatment:                                                DATE: 03/30/22 Therapeutic Exercise: LAQ with PT assist for TKE and tactile cues at quads, x15 cues for velocity and reduced compensations TKE RTB 2x10 w/ UE support, cues for reduced compensation at hip Seated adductor iso w/ theraball 3x10 cues for form and posture Standing TKE ball at wall x10 Seated march RLE only 2x10 Nustep 78mn RLE  and B UE for symptom modification/tissue extensibility  Modalities: Moist heat hamstrings/posterior knee per pt request 89m, no adverse events, tolerates well with good relief (supine with knee propped)   DATE:  03/28/2022: Therex: Nu step L6 RUE/RLE x 5 minutes, seat at 6 initially then 7 to facilitate extension Seated RLE LAQ active for max ext, then PT manual assist for >ROM, isometric hold & controlled eccentric 10 reps 2 sets Supine SAQ: x 10 reps 2 sets holding concentric, then manual leg press to engage quads, isometric hold 3 sec, & controlled eccentric Supine heel slide AA from PT for fuller ROM and with strap & lower leg on ball knee flexion 5 sec hold & ext 5 sec hold Supine right hip flexor stretch hooklying supine with RLE over edge 30 sec X 3 Supine quad set 3 sec hold 10 reps 2 sets with tactile cues for quad activation. Standing TKE red theraband with RW support 10 reps 3 sec hold Standing gastroc stretch forefoot on folded towel with RW support 30 sec hold X 3   Manual PROM: flexion/extension, passive hamstring stretch  Soft tissue work to distal hamstrings & posterior knee  Hot pack to hamstring & posterior knee X 6 min   03/21/2022: Therex: Nu step L6 RUE/RLE x 5 minutes, seat at 5 initially then 6 to facilitate extension Seated hamstring stretch 3 X 30 sec on Rt Standing gastroc stretch forefoot on folded towel with RW support 30 sec  hold X 3 Supine right hip flexor stretch hooklying supine with RLE over edge 30 sec X 3 Supine SLR X 10 Supine leg press into PT hand RLE 10 reps. Supine SAQ: x 15 holding 3 sec Seated RLE LAQ active for max ext, then PT manual assist for >ROM, isometric hold & controlled eccentric 6 reps Standing TKE red theraband with RW support 10 reps 3 sec hold PT adjusted pt RW up 1 notch to facilitate more upright posture with knee ext.   Manual PROM: flexion/extension, passive hamstring stretch    03/20/2022 Therex: Nu step L5  UE/LE x 5 minutes Seated hamstring stretch 3 X 30 sec on Rt Seated SLR X 10 Supine heelslides X 15 Supine SAQ: x 15 holding 3 sec Amb with bil loft strand crutches for pt to use at home without prosthetic leg on her left LE.  Manual PROM: flexion/extension, passive hamstring stretch  Modalities:  Moist heat to Rt hamstring during SAQ    PATIENT EDUCATION:  Education details: HEP, POC, reinforced AD use Person educated: Patient Education method: Explanation, Demonstration, Verbal cues, and Handouts Education comprehension: verbalized understanding, returned demonstration, and verbal cues required  HOME EXERCISE PROGRAM: Access Code: [email or web address] URL: [email or web address] Date: 03/12/2022 Prepared by: Jamey Reas  Exercises - Quad Setting and Stretching  - 2-4 x daily - 7 x weekly - 5-10 sets - 10 reps - prop 5-10 minutes & quad set5 seconds hold - Supine Leg Press  - 2-4 x daily - 7 x weekly - 5-10 sets - 10 reps - 5 seconds hold - Supine Heel Slide with Strap  - 2-3 x daily - 7 x weekly - 2-3 sets - 10 reps - 5 seconds hold - Supine Knee Extension Strengthening  - 2-3 x daily - 7 x weekly - 2-3 sets - 10 reps - 5 seconds hold - Seated Knee Flexion Extension AROM   - 2-4 x daily - 7 x weekly - 2-3 sets - 10 reps - 5 seconds hold - Seated Hamstring Stretch with Strap  - 2-4 x daily - 7 x weekly - 1 sets - 3 reps - 20-30 seconds hold  ASSESSMENT:  CLINICAL IMPRESSION: Pt arrives w/ increased pain she attributes to increased ambulation around hospital, 8/10 on NPS. Pt also arrives w/o AD, states she accidentally left it in car, education provided on benefit of AD use. Pt demonstrates reduced tolerance to closed chain activity on this date, requires intermittent seated rest. Most discomfort anterior knee. Incision inspected and appears to be healing well overall, mostly scarred although small scab at middle of incision, encouraged pt to continue monitoring. Pt reports  improved symptoms after nu step and moist heat at end of session. No adverse events. Pt departs today's session in no acute distress, all voiced questions/concerns addressed appropriately from PT perspective.     OBJECTIVE IMPAIRMENTS: Abnormal gait, decreased balance, decreased mobility, difficulty walking, decreased ROM, decreased strength, increased edema, increased muscle spasms, and pain.   ACTIVITY LIMITATIONS: carrying, lifting, standing, sleeping, stairs, transfers, and locomotion level  PARTICIPATION LIMITATIONS: meal prep, community activity, and church  PERSONAL FACTORS: Fitness, Time since onset of injury/illness/exacerbation, and 3+ comorbidities: see PMH  are also affecting patient's functional outcome.   REHAB POTENTIAL: Good  CLINICAL DECISION MAKING: Stable/uncomplicated  EVALUATION COMPLEXITY: Low   GOALS: Goals reviewed with patient? Yes  SHORT TERM GOALS: (target date for Short term goals are 3 weeks 03/30/2022)   1.  Patient will demonstrate independent use of home exercise  program to maintain progress from in clinic treatments.  Goal status: MET 03/20/22  LONG TERM GOALS: (target dates for all long term goals are 6 weeks  04/20/2022 )   1. Patient will demonstrate/report pain at worst less than or equal to 2/10 to facilitate minimal limitation in daily activity secondary to pain symptoms.  Goal status: New   2. Patient will demonstrate independent use of home exercise program to facilitate ability to maintain/progress functional gains from skilled physical therapy services.  Goal status: New   3. Patient will demonstrate FOTO outcome > or = 62 % to indicate reduced disability due to condition.  Goal status: New   4.  Right knee extension standing -5* AROM.   Goal status: New   5.  Patient ambulates >200' with prosthesis & LRAD modified independent. Goal status: New   6.  pt negotiates ramps & curbs with prosthesis & LRAD modified independent. Goal  status: New     PLAN:  PT FREQUENCY: 2x/week  PT DURATION: 6 weeks  PLANNED INTERVENTIONS: Therapeutic exercises, Therapeutic activity, Neuro Muscular re-education, Balance training, Gait training, Patient/Family education, Joint mobilization, Stair training, DME instructions, Dry Needling, Electrical stimulation, Traction, Cryotherapy, vasopneumatic deviceMoist heat, Taping, Ultrasound, Ionotophoresis 9m/ml Dexamethasone, and Manual therapy.  All included unless contraindicated  PLAN FOR NEXT SESSION:  continue with knee ROM and quad activation. Manual therapy especially soft tissue mobs & exercise for knee ROM especially extension. Heat to hamstrings if desired, would not recommend any heat to the front of her knee as still has some swelling.    DLeeroy ChaPT, DPT 03/30/2022 2:32 PM

## 2022-03-30 ENCOUNTER — Ambulatory Visit (INDEPENDENT_AMBULATORY_CARE_PROVIDER_SITE_OTHER): Payer: BC Managed Care – PPO | Admitting: Physical Therapy

## 2022-03-30 ENCOUNTER — Encounter: Payer: Self-pay | Admitting: Physical Therapy

## 2022-03-30 DIAGNOSIS — R2689 Other abnormalities of gait and mobility: Secondary | ICD-10-CM

## 2022-03-30 DIAGNOSIS — R600 Localized edema: Secondary | ICD-10-CM

## 2022-03-30 DIAGNOSIS — R2681 Unsteadiness on feet: Secondary | ICD-10-CM

## 2022-03-30 DIAGNOSIS — M25561 Pain in right knee: Secondary | ICD-10-CM

## 2022-03-30 DIAGNOSIS — M6281 Muscle weakness (generalized): Secondary | ICD-10-CM | POA: Diagnosis not present

## 2022-03-30 DIAGNOSIS — M25661 Stiffness of right knee, not elsewhere classified: Secondary | ICD-10-CM

## 2022-04-03 ENCOUNTER — Ambulatory Visit (INDEPENDENT_AMBULATORY_CARE_PROVIDER_SITE_OTHER): Payer: Self-pay | Admitting: Physical Therapy

## 2022-04-03 ENCOUNTER — Encounter: Payer: Self-pay | Admitting: Physical Therapy

## 2022-04-03 ENCOUNTER — Telehealth: Payer: Self-pay | Admitting: Gastroenterology

## 2022-04-03 DIAGNOSIS — M6281 Muscle weakness (generalized): Secondary | ICD-10-CM

## 2022-04-03 DIAGNOSIS — R600 Localized edema: Secondary | ICD-10-CM

## 2022-04-03 DIAGNOSIS — R2689 Other abnormalities of gait and mobility: Secondary | ICD-10-CM

## 2022-04-03 DIAGNOSIS — M25561 Pain in right knee: Secondary | ICD-10-CM

## 2022-04-03 DIAGNOSIS — R2681 Unsteadiness on feet: Secondary | ICD-10-CM

## 2022-04-03 DIAGNOSIS — M25661 Stiffness of right knee, not elsewhere classified: Secondary | ICD-10-CM

## 2022-04-03 NOTE — Therapy (Signed)
OUTPATIENT PHYSICAL THERAPY TREATMENT   Patient Name: Stacy Thomas MRN: [id number] DOB:06-30-1960, 62 y.o., female Today's Date: 04/03/2022  END OF SESSION:  PT End of Session - 04/03/22 1437     Visit Number 7    Number of Visits 12    Date for PT Re-Evaluation 04/20/22    Authorization Type BCBS    Authorization Time Period $150 COPAY, 17 PT VISITS REMAIN OUT OF 30 COMBINED PT/OT/CHIRO PER SERVICE YEAR    Authorization - Number of Visits 17    Progress Note Due on Visit 10    PT Start Time 1435    PT Stop Time 1508    PT Time Calculation (min) 33 min    Activity Tolerance Patient tolerated treatment well    Behavior During Therapy WFL for tasks assessed/performed                Past Medical History:  Diagnosis Date   Bilateral arm pain    chronic, due to use of crutches   Dependence on crutches    left leg prosthesis   Drug addiction in remission ([place])    since 2007  (crack cocaine,  IV drug use)   Hand weakness    Hepatic cirrhosis ([place]) 2018   followed by dr Havery Moros ([facility]) due to hx chronic hepatitis c   Hiatal hernia    History of esophageal stricture    post dilation x2   History of hepatitis C    pt completed harvoni treatment 2017, cured;  genotype 1a with fibrosis 3/4,  previously seen by dr comer   History of osteosarcoma    1982  left knee---  s/p  AKA LLE--   per pt no recurrence   History of recreational drug use multiple rehab visits   IV drug use for 1 yr and Smoked crack for 30+yrs--  per pt clean since 2007   Hyperlipidemia    Hypertension    Idiopathic peripheral neuropathy    OSA (obstructive sleep apnea)    study in epic 10-27-2018 by dr Rexene Alberts,  moderate osa,  pt stated used cpap until approx. 07/ 2021, intolerate of mask   Osteoarthritis    Pancreatic cyst    followed by dr Havery Moros--- per office note bening and stable   Sigmoid diverticulosis    mild   Wears glasses    Past Surgical History:  Procedure Laterality Date   ABOVE  KNEE LEG AMPUTATION Left 1982   osteosarcoma   CARPAL TUNNEL RELEASE Right 10/18/2021   Procedure: RIGHT CARPAL TUNNEL RELEASE;  Surgeon: Leandrew Koyanagi, MD;  Location: [facility];  Service: Orthopedics;  Laterality: Right;   COLONOSCOPY WITH PROPOFOL  last one 04-28-2020  dr Havery Moros   ESOPHAGOGASTRODUODENOSCOPY (EGD) WITH PROPOFOL  last one 11-02-2019  dr Havery Moros   EXCISION BREAST BX  2015   CYST   EXCISION OF SKIN TAG N/A 07/14/2020   Procedure: EXCISION OF ANAL PAPILLA;  Surgeon: Leighton Ruff, MD;  Location: [facility];  Service: General;  Laterality: N/A;   STERIOD INJECTION Left 10/18/2021   Procedure: LEFT CARPAL TUNNEL INJECTION;  Surgeon: Leandrew Koyanagi, MD;  Location: [facility];  Service: Orthopedics;  Laterality: Left;   TONSILLECTOMY  as child   TOTAL KNEE ARTHROPLASTY Right 02/07/2022   Procedure: RIGHT TOTAL KNEE ARTHROPLASTY;  Surgeon: Newt Minion, MD;  Location: [facility];  Service: Orthopedics;  Laterality: Right;   TRANSANAL HEMORRHOIDAL DEARTERIALIZATION N/A 10/27/2015  Procedure: TRANSANAL HEMORRHOIDAL DEARTERIALIZATION;  Surgeon: Michael Boston, MD;  Location: [facility];  Service: General;  Laterality: N/A;   UPPER GASTROINTESTINAL ENDOSCOPY     Patient Active Problem List   Diagnosis Date Noted   Total knee replacement status, right 02/07/2022   Malaise and fatigue 12/05/2021   Prediabetes 02/23/2021   Hair loss 02/23/2021   Memory changes 03/01/2020   CKD (chronic kidney disease) 09/11/2019   HLD (hyperlipidemia) 09/09/2018   Allergic sinusitis 12/03/2017   Hx of AKA (above knee amputation), left ([place]) 02/18/2017   Unilateral primary osteoarthritis, right knee 02/18/2017   Vitamin D deficiency 05/04/2016   Bilateral primary osteoarthritis of hip 04/26/2016   Bilateral carpal tunnel syndrome 03/29/2016   Screening for cervical cancer 03/06/2016   PVD (peripheral vascular disease) ([place]) 03/05/2016    Pancreatic abnormality 03/05/2016   Hypertension 03/05/2016   Hepatic cirrhosis ([place]) 11/15/2015   Chronic hepatitis C ([place]) 06/15/2015   Prolapsed internal hemorrhoids, grade 4 06/14/2015   Hx of osteosarcoma 06/14/2015    PCP: Sanjuan Dame, MD  REFERRING PROVIDER: Newt Minion, MD  REFERRING DIAG: [phone] (ICD-10-CM) - Status post total right knee replacement    THERAPY DIAG:  Stiffness of right knee, not elsewhere classified  Acute pain of right knee  Muscle weakness (generalized)  Other abnormalities of gait and mobility  Unsteadiness on feet  Localized edema  Rationale for Evaluation and Treatment: Rehabilitation  ONSET DATE: 02/07/22 TKA  SUBJECTIVE:   SUBJECTIVE STATEMENT: She has been doing her exercises.   PERTINENT HISTORY: left AKA, osteosarcoma, PVD, HTN, hepatic cirrhosis, hep C, CKD, HLD,   PAIN:  Are you having pain? Yes,  today 8/10 pain in Rt knee, in last week lowest 8/10 and highest 9.5/10 Sore Aggravating factors: walking on it Alleviating factors: getting off leg, tylenol, heating pads.  PRECAUTIONS: Fall  WEIGHT BEARING RESTRICTIONS: No  FALLS:  Has patient fallen in last 6 months? No  LIVING ENVIRONMENT: Lives with: lives with their spouse Lives in: House Stairs: No Has following equipment at home: Environmental consultant - 2 wheeled, Health visitor, Radio broadcast assistant (power), Electronics engineer, and Grab bars  OCCUPATION: disabled  RECREATION: active in church  PLOF: Independent, Independent with household mobility without device, and Independent with community mobility without device  prosthesis only.    PATIENT GOALS: get back to prior level, work in yard, be active in church handing out meals to homeless.   NEXT MD VISIT: 04/05/2022  OBJECTIVE:   DIAGNOSTIC FINDINGS:   PATIENT SURVEYS:  FOTO intake:  40%  predicted:  62%  COGNITION: Overall cognitive status: WFL   EDEMA:  RLE: above knee 40.3cm,  around knee 40cm,  below knee 34.5cm  which  appears to be moderate edema LLE has AKA so unable to compare LEs  POSTURE: forward head and flexed trunk   PALPATION: Tenderness along right knee joint line and incision.  Tightness in ITB, quad, hamstring & gastroc.   LOWER EXTREMITY ROM:   ROM Right eval Rt 03/20/22 Right 03/28/22 Right 04/03/22  Hip flexion      Hip extension      Hip abduction      Hip adduction      Hip internal rotation      Hip external rotation      Knee flexion Seated P: 111* A: 107* Supine P: 118 A: 115 Seated A: 112*   Knee extension Seated P: -20* A: -19*  supine A: -14* P: -10* Seated  A: LAQ -17*  P: -12* Standing A: -5*  Ankle dorsiflexion      Ankle plantarflexion      Ankle inversion      Ankle eversion       (Blank rows = not tested)  LOWER EXTREMITY MMT:  MMT Right eval Left eval  Hip flexion    Hip extension    Hip abduction    Hip adduction    Hip internal rotation    Hip external rotation    Knee flexion 3-/5   Knee extension 3-/5   Ankle dorsiflexion    Ankle plantarflexion    Ankle inversion    Ankle eversion     (Blank rows = not tested)  FUNCTIONAL TESTS:  18 inch chair transfer: requires UE assist on armrests.   GAIT: Distance walked: 10' Assistive device utilized: left Transfemoral prosthesis & no AD Level of assistance: SBA Comments: right knee flexed in stance   TODAY'S TREATMENT                                                                           [facility] Adult PT Treatment: DATE:  04/03/2022: Therapeutic Exercise: Standing gastroc stretrch forefoot on 2" step 30 sec hold 2 reps Heel raise with RW support 10 reps Standing TKE red theraband with RW support 10 reps 3 sec hold Seated with RLE propped 5# 1 min 2 reps Moving RLE floor to 8" stool for active knee flex to ext. Leg press 50# RLE with PT manual assist for knee ext 5 sec hold 10 reps 1 set back at 45* & 2 sets with back flat Step up & step down using RLE 4" step with RW support 5  reps.  Pt improved control eccentrically with instruction.   Manual therapy: PROM with overpressure for ext.   Muscle stretches for hamstring & hip flexor manually.   03/30/22 Therapeutic Exercise: LAQ with PT assist for TKE and tactile cues at quads, x15 cues for velocity and reduced compensations TKE RTB 2x10 w/ UE support, cues for reduced compensation at hip Seated adductor iso w/ theraball 3x10 cues for form and posture Standing TKE ball at wall x10 Seated march RLE only 2x10 Nustep 59mn RLE and B UE for symptom modification/tissue extensibility  Modalities: Moist heat hamstrings/posterior knee per pt request 851m, no adverse events, tolerates well with good relief (supine with knee propped)   03/28/2022: Therex: Nu step L6 RUE/RLE x 5 minutes, seat at 6 initially then 7 to facilitate extension Seated RLE LAQ active for max ext, then PT manual assist for >ROM, isometric hold & controlled eccentric 10 reps 2 sets Supine SAQ: x 10 reps 2 sets holding concentric, then manual leg press to engage quads, isometric hold 3 sec, & controlled eccentric Supine heel slide AA from PT for fuller ROM and with strap & lower leg on ball knee flexion 5 sec hold & ext 5 sec hold Supine right hip flexor stretch hooklying supine with RLE over edge 30 sec X 3 Supine quad set 3 sec hold 10 reps 2 sets with tactile cues for quad activation. Standing TKE red theraband with RW support 10 reps 3 sec hold Standing gastroc stretch forefoot on folded towel with RW support 30 sec  hold X 3   Manual PROM: flexion/extension, passive hamstring stretch  Soft tissue work to distal hamstrings & posterior knee  Hot pack to hamstring & posterior knee X 6 min    PATIENT EDUCATION:  Education details: HEP, POC, reinforced AD use Person educated: Patient Education method: Explanation, Demonstration, Verbal cues, and Handouts Education comprehension: verbalized understanding, returned demonstration, and verbal  cues required  HOME EXERCISE PROGRAM: Access Code: [email or web address] URL: [email or web address] Date: 03/12/2022 Prepared by: Jamey Reas  Exercises - Quad Setting and Stretching  - 2-4 x daily - 7 x weekly - 5-10 sets - 10 reps - prop 5-10 minutes & quad set5 seconds hold - Supine Leg Press  - 2-4 x daily - 7 x weekly - 5-10 sets - 10 reps - 5 seconds hold - Supine Heel Slide with Strap  - 2-3 x daily - 7 x weekly - 2-3 sets - 10 reps - 5 seconds hold - Supine Knee Extension Strengthening  - 2-3 x daily - 7 x weekly - 2-3 sets - 10 reps - 5 seconds hold - Seated Knee Flexion Extension AROM   - 2-4 x daily - 7 x weekly - 2-3 sets - 10 reps - 5 seconds hold - Seated Hamstring Stretch with Strap  - 2-4 x daily - 7 x weekly - 1 sets - 3 reps - 20-30 seconds hold  ASSESSMENT:  CLINICAL IMPRESSION: Pt improved knee ext range with manual therapy & exercise.  Pt continues to be limited by pain esp with muscle activity.   OBJECTIVE IMPAIRMENTS: Abnormal gait, decreased balance, decreased mobility, difficulty walking, decreased ROM, decreased strength, increased edema, increased muscle spasms, and pain.   ACTIVITY LIMITATIONS: carrying, lifting, standing, sleeping, stairs, transfers, and locomotion level  PARTICIPATION LIMITATIONS: meal prep, community activity, and church  PERSONAL FACTORS: Fitness, Time since onset of injury/illness/exacerbation, and 3+ comorbidities: see PMH  are also affecting patient's functional outcome.   REHAB POTENTIAL: Good  CLINICAL DECISION MAKING: Stable/uncomplicated  EVALUATION COMPLEXITY: Low   GOALS: Goals reviewed with patient? Yes  SHORT TERM GOALS: (target date for Short term goals are 3 weeks 03/30/2022)   1.  Patient will demonstrate independent use of home exercise program to maintain progress from in clinic treatments.  Goal status: MET 03/20/22  LONG TERM GOALS: (target dates for all long term goals are 6 weeks  04/20/2022 )   1.  Patient will demonstrate/report pain at worst less than or equal to 2/10 to facilitate minimal limitation in daily activity secondary to pain symptoms.  Goal status: New   2. Patient will demonstrate independent use of home exercise program to facilitate ability to maintain/progress functional gains from skilled physical therapy services.  Goal status: New   3. Patient will demonstrate FOTO outcome > or = 62 % to indicate reduced disability due to condition.  Goal status: New   4.  Right knee extension standing -5* AROM.   Goal status: New   5.  Patient ambulates >200' with prosthesis & LRAD modified independent. Goal status: New   6.  pt negotiates ramps & curbs with prosthesis & LRAD modified independent. Goal status: New     PLAN:  PT FREQUENCY: 2x/week  PT DURATION: 6 weeks  PLANNED INTERVENTIONS: Therapeutic exercises, Therapeutic activity, Neuro Muscular re-education, Balance training, Gait training, Patient/Family education, Joint mobilization, Stair training, DME instructions, Dry Needling, Electrical stimulation, Traction, Cryotherapy, vasopneumatic deviceMoist heat, Taping, Ultrasound, Ionotophoresis 12m/ml Dexamethasone, and Manual therapy.  All included unless contraindicated  PLAN FOR  NEXT SESSION:  knee ROM and quad activation. Manual therapy especially soft tissue mobs & exercise for knee ROM especially extension. Heat to hamstrings if desired, would not recommend any heat to the front of her knee as still has some swelling.    Jamey Reas, PT, DPT 04/03/2022, 3:14 PM

## 2022-04-03 NOTE — Telephone Encounter (Signed)
Returned call to patient. I informed patient that she will need to contact radiology scheduling to set up her RUQ [place] appt. Patient asked that I send her a MyChart message with the contact information. Pt had no concerns at the end of the call.

## 2022-04-03 NOTE — Telephone Encounter (Signed)
Patient called is ready to proceed with [place] of Abdomen please call to schedule.

## 2022-04-05 ENCOUNTER — Ambulatory Visit: Payer: BC Managed Care – PPO | Admitting: Orthopedic Surgery

## 2022-04-05 ENCOUNTER — Encounter: Payer: BC Managed Care – PPO | Admitting: Physical Therapy

## 2022-04-05 ENCOUNTER — Ambulatory Visit
Admission: RE | Admit: 2022-04-05 | Discharge: 2022-04-05 | Disposition: A | Discharge status: Home / Self Care | Payer: BC Managed Care – PPO | Source: Ambulatory Visit | Attending: Internal Medicine | Admitting: Internal Medicine

## 2022-04-05 DIAGNOSIS — Z1231 Encounter for screening mammogram for malignant neoplasm of breast: Secondary | ICD-10-CM

## 2022-04-05 NOTE — Therapy (Incomplete)
OUTPATIENT PHYSICAL THERAPY TREATMENT   Patient Name: Stacy Thomas MRN: [id number] DOB:06-30-1960, 62 y.o., female Today's Date: 04/03/2022  END OF SESSION:  PT End of Session - 04/03/22 1437     Visit Number 7    Number of Visits 12    Date for PT Re-Evaluation 04/20/22    Authorization Type BC[employer]    Authorization Time Period $150 COPAY, 17 PT VISITS REMAIN OUT OF 30 COMBINED PT/OT/CHIRO PER SERVICE YEAR    Authorization - Number of Visits 17    Progress Note Due on Visit 10    PT Start Time 1435    PT Stop Time 1508    PT Time Calculation (min) 33 min    Activity Tolerance Patient tolerated treatment well    Behavior During Therapy WFL for tasks assessed/performed                Past Medical History:  Diagnosis Date   Bilateral arm pain    chronic, due to use of crutches   Dependence on crutches    left leg prosthesis   Drug addiction in remission ([place])    since 2007  (crack cocaine,  IV drug use)   Hand weakness    Hepatic cirrhosis ([place]) 2018   followed by dr Havery Moros ([facility]) due to hx chronic hepatitis c   Hiatal hernia    History of esophageal stricture    post dilation x2   History of hepatitis C    pt completed [facility] treatment 2017, cured;  genotype 1a with fibrosis 3/4,  previously seen by dr comer   History of osteosarcoma    1982  left knee---  s/p  AKA LLE--   per pt no recurrence   History of recreational drug use multiple rehab visits   IV drug use for 1 yr and Smoked crack for 30+yrs--  per pt clean since 2007   Hyperlipidemia    Hypertension    Idiopathic peripheral neuropathy    OSA (obstructive sleep apnea)    study in epic 10-27-2018 by dr Rexene Alberts,  moderate osa,  pt stated used cpap until approx. 07/ 2021, intolerate of mask   Osteoarthritis    Pancreatic cyst    followed by dr Havery Moros--- per office note bening and stable   Sigmoid diverticulosis    mild   Wears glasses    Past Surgical History:  Procedure Laterality Date   ABOVE  KNEE LEG AMPUTATION Left 1982   osteosarcoma   CARPAL TUNNEL RELEASE Right 10/18/2021   Procedure: RIGHT CARPAL TUNNEL RELEASE;  Surgeon: Leandrew Koyanagi, MD;  Location: [facility];  Service: Orthopedics;  Laterality: Right;   COLONOSCOPY WITH PROPOFOL  last one 04-28-2020  dr Havery Moros   ESOPHAGOGASTRODUODENOSCOPY (EGD) WITH PROPOFOL  last one 11-02-2019  dr Havery Moros   EXCISION BREAST BX  2015   CYST   EXCISION OF SKIN TAG N/A 07/14/2020   Procedure: EXCISION OF ANAL PAPILLA;  Surgeon: Leighton Ruff, MD;  Location: [facility];  Service: General;  Laterality: N/A;   STERIOD INJECTION Left 10/18/2021   Procedure: LEFT CARPAL TUNNEL INJECTION;  Surgeon: Leandrew Koyanagi, MD;  Location: [facility];  Service: Orthopedics;  Laterality: Left;   TONSILLECTOMY  as child   TOTAL KNEE ARTHROPLASTY Right 02/07/2022   Procedure: RIGHT TOTAL KNEE ARTHROPLASTY;  Surgeon: Newt Minion, MD;  Location: [facility];  Service: Orthopedics;  Laterality: Right;   TRANSANAL HEMORRHOIDAL DEARTERIALIZATION N/A 10/27/2015  Procedure: TRANSANAL HEMORRHOIDAL DEARTERIALIZATION;  Surgeon: Michael Boston, MD;  Location: [facility];  Service: General;  Laterality: N/A;   UPPER GASTROINTESTINAL ENDOSCOPY     Patient Active Problem List   Diagnosis Date Noted   Total knee replacement status, right 02/07/2022   Malaise and fatigue 12/05/2021   Prediabetes 02/23/2021   Hair loss 02/23/2021   Memory changes 03/01/2020   CKD (chronic kidney disease) 09/11/2019   HLD (hyperlipidemia) 09/09/2018   Allergic sinusitis 12/03/2017   Hx of AKA (above knee amputation), left ([place]) 02/18/2017   Unilateral primary osteoarthritis, right knee 02/18/2017   Vitamin D deficiency 05/04/2016   Bilateral primary osteoarthritis of hip 04/26/2016   Bilateral carpal tunnel syndrome 03/29/2016   Screening for cervical cancer 03/06/2016   PVD (peripheral vascular disease) ([place]) 03/05/2016    Pancreatic abnormality 03/05/2016   Hypertension 03/05/2016   Hepatic cirrhosis ([place]) 11/15/2015   Chronic hepatitis C ([place]) 06/15/2015   Prolapsed internal hemorrhoids, grade 4 06/14/2015   Hx of osteosarcoma 06/14/2015    PCP: Sanjuan Dame, MD  REFERRING PROVIDER: Newt Minion, MD  REFERRING DIAG: [phone] (ICD-10-CM) - Status post total right knee replacement    THERAPY DIAG:  Stiffness of right knee, not elsewhere classified  Acute pain of right knee  Muscle weakness (generalized)  Other abnormalities of gait and mobility  Unsteadiness on feet  Localized edema  Rationale for Evaluation and Treatment: Rehabilitation  ONSET DATE: 02/07/22 TKA  SUBJECTIVE:   SUBJECTIVE STATEMENT: *** She has been doing her exercises.   PERTINENT HISTORY: left AKA, osteosarcoma, PVD, HTN, hepatic cirrhosis, hep C, CKD, HLD,   PAIN:  Are you having pain? Yes,  today *** 8/10 pain in Rt knee, in last week lowest 8/10 and highest 9.5/10 Sore Aggravating factors: walking on it Alleviating factors: getting off leg, tylenol, heating pads.  PRECAUTIONS: Fall  WEIGHT BEARING RESTRICTIONS: No  FALLS:  Has patient fallen in last 6 months? No  LIVING ENVIRONMENT: Lives with: lives with their spouse Lives in: House Stairs: No Has following equipment at home: Environmental consultant - 2 wheeled, Health visitor, Radio broadcast assistant (power), Electronics engineer, and Grab bars  OCCUPATION: disabled  RECREATION: active in church  PLOF: Independent, Independent with household mobility without device, and Independent with community mobility without device  prosthesis only.    PATIENT GOALS: get back to prior level, work in yard, be active in church handing out meals to homeless.   NEXT MD VISIT: 04/05/2022  OBJECTIVE:   DIAGNOSTIC FINDINGS:   PATIENT SURVEYS:  FOTO intake:  40%  predicted:  62%  COGNITION: Overall cognitive status: WFL   EDEMA:  RLE: above knee 40.3cm,  around knee 40cm,  below knee 34.5cm   which appears to be moderate edema LLE has AKA so unable to compare LEs  POSTURE: forward head and flexed trunk   PALPATION: Tenderness along right knee joint line and incision.  Tightness in ITB, quad, hamstring & gastroc.   LOWER EXTREMITY ROM:   ROM Right eval Rt 03/20/22 Right 03/28/22 Right 04/03/22  Hip flexion      Hip extension      Hip abduction      Hip adduction      Hip internal rotation      Hip external rotation      Knee flexion Seated P: 111* A: 107* Supine P: 118 A: 115 Seated A: 112*   Knee extension Seated P: -20* A: -19*  supine A: -14* P: -10* Seated  A:  LAQ -17* P: -12* Standing A: -5*  Ankle dorsiflexion      Ankle plantarflexion      Ankle inversion      Ankle eversion       (Blank rows = not tested)  LOWER EXTREMITY MMT:  MMT Right eval Left eval  Hip flexion    Hip extension    Hip abduction    Hip adduction    Hip internal rotation    Hip external rotation    Knee flexion 3-/5   Knee extension 3-/5   Ankle dorsiflexion    Ankle plantarflexion    Ankle inversion    Ankle eversion     (Blank rows = not tested)  FUNCTIONAL TESTS:  18 inch chair transfer: requires UE assist on armrests.   GAIT: Distance walked: 10' Assistive device utilized: left Transfemoral prosthesis & no AD Level of assistance: SBA Comments: right knee flexed in stance   TODAY'S TREATMENT                                                                           [facility] Adult PT Treatment: DATE:  04/05/2022: ***  04/03/2022: Therapeutic Exercise: Standing gastroc stretrch forefoot on 2" step 30 sec hold 2 reps Heel raise with RW support 10 reps Standing TKE red theraband with RW support 10 reps 3 sec hold Seated with RLE propped 5# 1 min 2 reps Moving RLE floor to 8" stool for active knee flex to ext. Leg press 50# RLE with PT manual assist for knee ext 5 sec hold 10 reps 1 set back at 45* & 2 sets with back flat Step up & step down using RLE 4"  step with RW support 5 reps.  Pt improved control eccentrically with instruction.   Manual therapy: PROM with overpressure for ext.   Muscle stretches for hamstring & hip flexor manually.   03/30/22 Therapeutic Exercise: LAQ with PT assist for TKE and tactile cues at quads, x15 cues for velocity and reduced compensations TKE RTB 2x10 w/ UE support, cues for reduced compensation at hip Seated adductor iso w/ theraball 3x10 cues for form and posture Standing TKE ball at wall x10 Seated march RLE only 2x10 Nustep 8mn RLE and B UE for symptom modification/tissue extensibility  Modalities: Moist heat hamstrings/posterior knee per pt request 855m, no adverse events, tolerates well with good relief (supine with knee propped)   PATIENT EDUCATION:  Education details: HEP, POC, reinforced AD use Person educated: Patient Education method: Explanation, Demonstration, Verbal cues, and Handouts Education comprehension: verbalized understanding, returned demonstration, and verbal cues required  HOME EXERCISE PROGRAM: Access Code: [email or web address] URL: [email or web address] Date: 03/12/2022 Prepared by: RoJamey ReasExercises - Quad Setting and Stretching  - 2-4 x daily - 7 x weekly - 5-10 sets - 10 reps - prop 5-10 minutes & quad set5 seconds hold - Supine Leg Press  - 2-4 x daily - 7 x weekly - 5-10 sets - 10 reps - 5 seconds hold - Supine Heel Slide with Strap  - 2-3 x daily - 7 x weekly - 2-3 sets - 10 reps - 5 seconds hold - Supine Knee Extension Strengthening  - 2-3 x daily - 7 x weekly -  2-3 sets - 10 reps - 5 seconds hold - Seated Knee Flexion Extension AROM   - 2-4 x daily - 7 x weekly - 2-3 sets - 10 reps - 5 seconds hold - Seated Hamstring Stretch with Strap  - 2-4 x daily - 7 x weekly - 1 sets - 3 reps - 20-30 seconds hold  ASSESSMENT:  CLINICAL IMPRESSION: *** Pt improved knee ext range with manual therapy & exercise.  Pt continues to be limited by pain esp with muscle  activity.   OBJECTIVE IMPAIRMENTS: Abnormal gait, decreased balance, decreased mobility, difficulty walking, decreased ROM, decreased strength, increased edema, increased muscle spasms, and pain.   ACTIVITY LIMITATIONS: carrying, lifting, standing, sleeping, stairs, transfers, and locomotion level  PARTICIPATION LIMITATIONS: meal prep, community activity, and church  PERSONAL FACTORS: Fitness, Time since onset of injury/illness/exacerbation, and 3+ comorbidities: see PMH  are also affecting patient's functional outcome.   REHAB POTENTIAL: Good  CLINICAL DECISION MAKING: Stable/uncomplicated  EVALUATION COMPLEXITY: Low   GOALS: Goals reviewed with patient? Yes  SHORT TERM GOALS: (target date for Short term goals are 3 weeks 03/30/2022)   1.  Patient will demonstrate independent use of home exercise program to maintain progress from in clinic treatments.  Goal status: MET 03/20/22  LONG TERM GOALS: (target dates for all long term goals are 6 weeks  04/20/2022 )   1. Patient will demonstrate/report pain at worst less than or equal to 2/10 to facilitate minimal limitation in daily activity secondary to pain symptoms.  Goal status: New   2. Patient will demonstrate independent use of home exercise program to facilitate ability to maintain/progress functional gains from skilled physical therapy services.  Goal status: New   3. Patient will demonstrate FOTO outcome > or = 62 % to indicate reduced disability due to condition.  Goal status: New   4.  Right knee extension standing -5* AROM.   Goal status: New   5.  Patient ambulates >200' with prosthesis & LRAD modified independent. Goal status: New   6.  pt negotiates ramps & curbs with prosthesis & LRAD modified independent. Goal status: New     PLAN:  PT FREQUENCY: 2x/week  PT DURATION: 6 weeks  PLANNED INTERVENTIONS: Therapeutic exercises, Therapeutic activity, Neuro Muscular re-education, Balance training, Gait  training, Patient/Family education, Joint mobilization, Stair training, DME instructions, Dry Needling, Electrical stimulation, Traction, Cryotherapy, vasopneumatic deviceMoist heat, Taping, Ultrasound, Ionotophoresis 57m/ml Dexamethasone, and Manual therapy.  All included unless contraindicated  PLAN FOR NEXT SESSION:  ***  knee ROM and quad activation. Manual therapy especially soft tissue mobs & exercise for knee ROM especially extension. Heat to hamstrings if desired, would not recommend any heat to the front of her knee as still has some swelling.    RJamey Reas PT, DPT 04/03/2022, 3:14 PM

## 2022-04-09 ENCOUNTER — Encounter: Payer: Self-pay | Admitting: Physical Therapy

## 2022-04-09 ENCOUNTER — Ambulatory Visit (INDEPENDENT_AMBULATORY_CARE_PROVIDER_SITE_OTHER): Payer: BC Managed Care – PPO | Admitting: Physical Therapy

## 2022-04-09 DIAGNOSIS — R600 Localized edema: Secondary | ICD-10-CM

## 2022-04-09 DIAGNOSIS — M25661 Stiffness of right knee, not elsewhere classified: Secondary | ICD-10-CM

## 2022-04-09 DIAGNOSIS — R2689 Other abnormalities of gait and mobility: Secondary | ICD-10-CM

## 2022-04-09 DIAGNOSIS — M25561 Pain in right knee: Secondary | ICD-10-CM | POA: Diagnosis not present

## 2022-04-09 DIAGNOSIS — M6281 Muscle weakness (generalized): Secondary | ICD-10-CM | POA: Diagnosis not present

## 2022-04-09 DIAGNOSIS — R2681 Unsteadiness on feet: Secondary | ICD-10-CM

## 2022-04-09 NOTE — Therapy (Signed)
OUTPATIENT PHYSICAL THERAPY TREATMENT   Patient Name: Stacy Thomas MRN: [id number] DOB:Jun 23, 1960, 61 y.o., female Today's Date: 04/09/2022  END OF SESSION:  PT End of Session - 04/09/22 1300     Visit Number 8    Number of Visits 12    Date for PT Re-Evaluation 04/20/22    Authorization Type BC[employer]    Authorization Time Period $150 COPAY, 17 PT VISITS REMAIN OUT OF 30 COMBINED PT/OT/CHIRO PER SERVICE YEAR    Authorization - Number of Visits 17    Progress Note Due on Visit 10    PT Start Time 1300    PT Stop Time 1341    PT Time Calculation (min) 41 min    Activity Tolerance Patient tolerated treatment well    Behavior During Therapy WFL for tasks assessed/performed                 Past Medical History:  Diagnosis Date   Bilateral arm pain    chronic, due to use of crutches   Dependence on crutches    left leg prosthesis   Drug addiction in remission ([place])    since 2007  (crack cocaine,  IV drug use)   Hand weakness    Hepatic cirrhosis ([place]) 2018   followed by dr Havery Moros (gi) due to hx chronic hepatitis c   Hiatal hernia    History of esophageal stricture    post dilation x2   History of hepatitis C    pt completed [facility] treatment 2017, cured;  genotype 1a with fibrosis 3/4,  previously seen by dr comer   History of osteosarcoma    1982  left knee---  s/p  AKA LLE--   per pt no recurrence   History of recreational drug use multiple rehab visits   IV drug use for 1 yr and Smoked crack for 30+yrs--  per pt clean since 2007   Hyperlipidemia    Hypertension    Idiopathic peripheral neuropathy    OSA (obstructive sleep apnea)    study in epic 10-27-2018 by dr Rexene Alberts,  moderate osa,  pt stated used cpap until approx. 07/ 2021, intolerate of mask   Osteoarthritis    Pancreatic cyst    followed by dr Havery Moros--- per office note bening and stable   Sigmoid diverticulosis    mild   Wears glasses    Past Surgical History:  Procedure Laterality Date    ABOVE KNEE LEG AMPUTATION Left 1982   osteosarcoma   BREAST BIOPSY Left 01/2021   x2   CARPAL TUNNEL RELEASE Right 10/18/2021   Procedure: RIGHT CARPAL TUNNEL RELEASE;  Surgeon: Leandrew Koyanagi, MD;  Location: [place];  Service: Orthopedics;  Laterality: Right;   COLONOSCOPY WITH PROPOFOL  last one 04-28-2020  dr Havery Moros   ESOPHAGOGASTRODUODENOSCOPY (EGD) WITH PROPOFOL  last one 11-02-2019  dr Havery Moros   EXCISION BREAST BX  2015   CYST   EXCISION OF SKIN TAG N/A 07/14/2020   Procedure: EXCISION OF ANAL PAPILLA;  Surgeon: Leighton Ruff, MD;  Location[facility];  Service: General;  Laterality: N/A;   STERIOD INJECTION Left 10/18/2021   Procedure: LEFT CARPAL TUNNEL INJECTION;  Surgeon: Leandrew Koyanagi, MD;  Location: [place];  Service: Orthopedics;  Laterality: Left;   TONSILLECTOMY  as child   TOTAL KNEE ARTHROPLASTY Right 02/07/2022   Procedure: RIGHT TOTAL KNEE ARTHROPLASTY;  Surgeon: Newt Minion, MD;  Location: [place];  Service: Orthopedics;  Laterality:  Right;   TRANSANAL HEMORRHOIDAL DEARTERIALIZATION N/A 10/27/2015   Procedure: TRANSANAL HEMORRHOIDAL DEARTERIALIZATION;  Surgeon: Michael Boston, MD;  Location: [facility];  Service: General;  Laterality: N/A;   UPPER GASTROINTESTINAL ENDOSCOPY     Patient Active Problem List   Diagnosis Date Noted   Total knee replacement status, right 02/07/2022   Malaise and fatigue 12/05/2021   Prediabetes 02/23/2021   Hair loss 02/23/2021   Memory changes 03/01/2020   CKD (chronic kidney disease) 09/11/2019   HLD (hyperlipidemia) 09/09/2018   Allergic sinusitis 12/03/2017   Hx of AKA (above knee amputation), left ([place]) 02/18/2017   Unilateral primary osteoarthritis, right knee 02/18/2017   Vitamin D deficiency 05/04/2016   Bilateral primary osteoarthritis of hip 04/26/2016   Bilateral carpal tunnel syndrome 03/29/2016   Screening for cervical cancer 03/06/2016   PVD  (peripheral vascular disease) ([place]) 03/05/2016   Pancreatic abnormality 03/05/2016   Hypertension 03/05/2016   Hepatic cirrhosis ([place]) 11/15/2015   Chronic hepatitis C ([place]) 06/15/2015   Prolapsed internal hemorrhoids, grade 4 06/14/2015   Hx of osteosarcoma 06/14/2015    PCP: Sanjuan Dame, MD  REFERRING PROVIDER: Newt Minion, MD  REFERRING DIAG: [phone] (ICD-10-CM) - Status post total right knee replacement    THERAPY DIAG:  Stiffness of right knee, not elsewhere classified  Acute pain of right knee  Muscle weakness (generalized)  Other abnormalities of gait and mobility  Unsteadiness on feet  Localized edema  Rationale for Evaluation and Treatment: Rehabilitation  ONSET DATE: 02/07/22 TKA  SUBJECTIVE:   SUBJECTIVE STATEMENT: She walked so much last week that she had to cancel 2nd PT appt.  She calmed knee down by staying off it, hot water bath, Voltaran cream.    PERTINENT HISTORY: left AKA, osteosarcoma, PVD, HTN, hepatic cirrhosis, hep C, CKD, HLD,   PAIN:  Are you having pain? Yes,  today  8.5/10 pain in Rt knee, in last week lowest 8.5/10 and highest 10/10 Sore Aggravating factors: walking on it Alleviating factors: getting off leg, tylenol, heating pads.  PRECAUTIONS: Fall  WEIGHT BEARING RESTRICTIONS: No  FALLS:  Has patient fallen in last 6 months? No  LIVING ENVIRONMENT: Lives with: lives with their spouse Lives in: House Stairs: No Has following equipment at home: Environmental consultant - 2 wheeled, Health visitor, Radio broadcast assistant (power), Electronics engineer, and Grab bars  OCCUPATION: disabled  RECREATION: active in church  PLOF: Independent, Independent with household mobility without device, and Independent with community mobility without device  prosthesis only.    PATIENT GOALS: get back to prior level, work in yard, be active in church handing out meals to homeless.   NEXT MD VISIT: 04/05/2022  OBJECTIVE:   DIAGNOSTIC FINDINGS:   PATIENT SURVEYS:  FOTO  intake:  40%  predicted:  62%  COGNITION: Overall cognitive status: WFL   EDEMA:  RLE: above knee 40.3cm,  around knee 40cm,  below knee 34.5cm  which appears to be moderate edema LLE has AKA so unable to compare LEs  POSTURE: forward head and flexed trunk   PALPATION: Tenderness along right knee joint line and incision.  Tightness in ITB, quad, hamstring & gastroc.   LOWER EXTREMITY ROM:   ROM Right eval Rt 03/20/22 Right 03/28/22 Right 04/03/22 Right 04/09/22  Hip flexion       Hip extension       Hip abduction       Hip adduction       Hip internal rotation       Hip external  rotation       Knee flexion Seated P: 111* A: 107* Supine P: 118 A: 115 Seated A: 112*    Knee extension Seated P: -20* A: -19*  supine A: -14* P: -10* Seated  A: LAQ -17* P: -12* Standing A: -5* Leg press With 50# A:-5*  Ankle dorsiflexion       Ankle plantarflexion       Ankle inversion       Ankle eversion        (Blank rows = not tested)  LOWER EXTREMITY MMT:  MMT Right eval Left eval  Hip flexion    Hip extension    Hip abduction    Hip adduction    Hip internal rotation    Hip external rotation    Knee flexion 3-/5   Knee extension 3-/5   Ankle dorsiflexion    Ankle plantarflexion    Ankle inversion    Ankle eversion     (Blank rows = not tested)  FUNCTIONAL TESTS:  18 inch chair transfer: requires UE assist on armrests.   GAIT: Distance walked: 10' Assistive device utilized: left Transfemoral prosthesis & no AD Level of assistance: SBA Comments: right knee flexed in stance   TODAY'S TREATMENT                                                                           [facility] Adult PT Treatment: DATE:  04/09/2022: Therapeutic Exercise: Standing gastroc stretrch forefoot on 2" step 30 sec hold 2 reps Heel raise with LUE support //bar 15 reps Rocker board using knee ext to push back down 1 min.  Standing TKE red theraband with RW support 10 reps 3 sec  hold Moving RLE floor to 8" stool for active knee flex to ext. Quad sets with knee ext varying degrees 5 sec hold 3 reps ea range for total 15 reps. LAQ active max range with strap assist end range 15 reps.  Leg press 50# RLE with PT manual assist for knee ext 5 sec hold 15 reps 1 set back at 45* & 2 sets with back flat Step up & step down using RLE 4" step with RW support 5 reps.  Pt improved control eccentrically with instruction.   Manual therapy: PROM with overpressure for ext.   Mob belt for ext in standing.  Muscle stretches for hamstring & hip flexor manually.   04/03/2022: Therapeutic Exercise: Standing gastroc stretrch forefoot on 2" step 30 sec hold 2 reps Heel raise with RW support 10 reps Standing TKE red theraband with RW support 10 reps 3 sec hold Seated with RLE propped 5# 1 min 2 reps Moving RLE floor to 8" stool for active knee flex to ext. Leg press 50# RLE with PT manual assist for knee ext 5 sec hold 10 reps 1 set back at 45* & 2 sets with back flat Step up & step down using RLE 4" step with RW support 5 reps.  Pt improved control eccentrically with instruction.   Manual therapy: PROM with overpressure for ext.   Muscle stretches for hamstring & hip flexor manually.   03/30/22 Therapeutic Exercise: LAQ with PT assist for TKE and tactile cues at quads, x15 cues for  velocity and reduced compensations TKE RTB 2x10 w/ UE support, cues for reduced compensation at hip Seated adductor iso w/ theraball 3x10 cues for form and posture Standing TKE ball at wall x10 Seated march RLE only 2x10 Nustep 55mn RLE and B UE for symptom modification/tissue extensibility  Modalities: Moist heat hamstrings/posterior knee per pt request 860m, no adverse events, tolerates well with good relief (supine with knee propped)   PATIENT EDUCATION:  Education details: HEP, POC, reinforced AD use Person educated: Patient Education method: Explanation, Demonstration, Verbal cues, and  Handouts Education comprehension: verbalized understanding, returned demonstration, and verbal cues required  HOME EXERCISE PROGRAM: Access Code: [email or web address] URL: [email or web address] Date: 03/12/2022 Prepared by: RoJamey ReasExercises - Quad Setting and Stretching  - 2-4 x daily - 7 x weekly - 5-10 sets - 10 reps - prop 5-10 minutes & quad set5 seconds hold - Supine Leg Press  - 2-4 x daily - 7 x weekly - 5-10 sets - 10 reps - 5 seconds hold - Supine Heel Slide with Strap  - 2-3 x daily - 7 x weekly - 2-3 sets - 10 reps - 5 seconds hold - Supine Knee Extension Strengthening  - 2-3 x daily - 7 x weekly - 2-3 sets - 10 reps - 5 seconds hold - Seated Knee Flexion Extension AROM   - 2-4 x daily - 7 x weekly - 2-3 sets - 10 reps - 5 seconds hold - Seated Hamstring Stretch with Strap  - 2-4 x daily - 7 x weekly - 1 sets - 3 reps - 20-30 seconds hold  ASSESSMENT:  CLINICAL IMPRESSION: Patient appears on target to meet LTGs next week. She has improved both passive & active knee ext.     OBJECTIVE IMPAIRMENTS: Abnormal gait, decreased balance, decreased mobility, difficulty walking, decreased ROM, decreased strength, increased edema, increased muscle spasms, and pain.   ACTIVITY LIMITATIONS: carrying, lifting, standing, sleeping, stairs, transfers, and locomotion level  PARTICIPATION LIMITATIONS: meal prep, community activity, and church  PERSONAL FACTORS: Fitness, Time since onset of injury/illness/exacerbation, and 3+ comorbidities: see PMH  are also affecting patient's functional outcome.   REHAB POTENTIAL: Good  CLINICAL DECISION MAKING: Stable/uncomplicated  EVALUATION COMPLEXITY: Low   GOALS: Goals reviewed with patient? Yes  SHORT TERM GOALS: (target date for Short term goals are 3 weeks 03/30/2022)   1.  Patient will demonstrate independent use of home exercise program to maintain progress from in clinic treatments.  Goal status: MET 03/20/22  LONG TERM  GOALS: (target dates for all long term goals are 6 weeks  04/20/2022 )   1. Patient will demonstrate/report pain at worst less than or equal to 2/10 to facilitate minimal limitation in daily activity secondary to pain symptoms.  Goal status: New   2. Patient will demonstrate independent use of home exercise program to facilitate ability to maintain/progress functional gains from skilled physical therapy services.  Goal status: New   3. Patient will demonstrate FOTO outcome > or = 62 % to indicate reduced disability due to condition.  Goal status: New   4.  Right knee extension standing -5* AROM.   Goal status: New   5.  Patient ambulates >200' with prosthesis & LRAD modified independent. Goal status: New   6.  pt negotiates ramps & curbs with prosthesis & LRAD modified independent. Goal status: New     PLAN:  PT FREQUENCY: 2x/week  PT DURATION: 6 weeks  PLANNED INTERVENTIONS: Therapeutic exercises, Therapeutic activity, Neuro Muscular re-education, Balance training,  Gait training, Patient/Family education, Joint mobilization, Stair training, DME instructions, Dry Needling, Electrical stimulation, Traction, Cryotherapy, vasopneumatic deviceMoist heat, Taping, Ultrasound, Ionotophoresis '4mg'$ /ml Dexamethasone, and Manual therapy.  All included unless contraindicated  PLAN FOR NEXT SESSION: continue knee ROM and quad activation. Manual therapy especially soft tissue mobs & exercise for knee ROM especially extension. Heat to hamstrings if desired, would not recommend any heat to the front of her knee as still has some swelling.    Jamey Reas, PT, DPT 04/09/2022, 1:45 PM

## 2022-04-11 ENCOUNTER — Encounter: Payer: Self-pay | Admitting: Physical Therapy

## 2022-04-11 ENCOUNTER — Ambulatory Visit (INDEPENDENT_AMBULATORY_CARE_PROVIDER_SITE_OTHER): Payer: BC Managed Care – PPO | Admitting: Physical Therapy

## 2022-04-11 DIAGNOSIS — M6281 Muscle weakness (generalized): Secondary | ICD-10-CM | POA: Diagnosis not present

## 2022-04-11 DIAGNOSIS — M25661 Stiffness of right knee, not elsewhere classified: Secondary | ICD-10-CM

## 2022-04-11 DIAGNOSIS — R2689 Other abnormalities of gait and mobility: Secondary | ICD-10-CM | POA: Diagnosis not present

## 2022-04-11 DIAGNOSIS — M25561 Pain in right knee: Secondary | ICD-10-CM

## 2022-04-11 DIAGNOSIS — R2681 Unsteadiness on feet: Secondary | ICD-10-CM

## 2022-04-11 DIAGNOSIS — R600 Localized edema: Secondary | ICD-10-CM

## 2022-04-11 NOTE — Therapy (Signed)
OUTPATIENT PHYSICAL THERAPY TREATMENT   Patient Name: Stacy Thomas MRN: [id number] DOB:1960/10/12, 62 y.o., female Today's Date: 04/11/2022  END OF SESSION:  PT End of Session - 04/11/22 1303     Visit Number 9    Number of Visits 12    Date for PT Re-Evaluation 04/20/22    Authorization Type BC[employer]    Authorization Time Period $150 COPAY, 17 PT VISITS REMAIN OUT OF 30 COMBINED PT/OT/CHIRO PER SERVICE YEAR    Authorization - Number of Visits 17    Progress Note Due on Visit 10    PT Start Time 1303    PT Stop Time 1332    PT Time Calculation (min) 29 min    Activity Tolerance Patient tolerated treatment well    Behavior During Therapy WFL for tasks assessed/performed                 Past Medical History:  Diagnosis Date   Bilateral arm pain    chronic, due to use of crutches   Dependence on crutches    left leg prosthesis   Drug addiction in remission ([place])    since 2007  (crack cocaine,  IV drug use)   Hand weakness    Hepatic cirrhosis ([place]) 2018   followed by dr Havery Moros ([facility]) due to hx chronic hepatitis c   Hiatal hernia    History of esophageal stricture    post dilation x2   History of hepatitis C    pt completed harvoni treatment 2017, cured;  genotype 1a with fibrosis 3/4,  previously seen by dr comer   History of osteosarcoma    1982  left knee---  s/p  AKA LLE--   per pt no recurrence   History of recreational drug use multiple rehab visits   IV drug use for 1 yr and Smoked crack for 30+yrs--  per pt clean since 2007   Hyperlipidemia    Hypertension    Idiopathic peripheral neuropathy    OSA (obstructive sleep apnea)    study in epic 10-27-2018 by dr Rexene Alberts,  moderate osa,  pt stated used cpap until approx. 07/ 2021, intolerate of mask   Osteoarthritis    Pancreatic cyst    followed by dr Havery Moros--- per office note bening and stable   Sigmoid diverticulosis    mild   Wears glasses    Past Surgical History:  Procedure Laterality Date    ABOVE KNEE LEG AMPUTATION Left 1982   osteosarcoma   BREAST BIOPSY Left 01/2021   x2   CARPAL TUNNEL RELEASE Right 10/18/2021   Procedure: RIGHT CARPAL TUNNEL RELEASE;  Surgeon: Leandrew Koyanagi, MD;  Location: [facility];  Service: Orthopedics;  Laterality: Right;   COLONOSCOPY WITH PROPOFOL  last one 04-28-2020  dr Havery Moros   ESOPHAGOGASTRODUODENOSCOPY (EGD) WITH PROPOFOL  last one 11-02-2019  dr Havery Moros   EXCISION BREAST BX  2015   CYST   EXCISION OF SKIN TAG N/A 07/14/2020   Procedure: EXCISION OF ANAL PAPILLA;  Surgeon: Leighton Ruff, MD;  Location[facility];  Service: General;  Laterality: N/A;   STERIOD INJECTION Left 10/18/2021   Procedure: LEFT CARPAL TUNNEL INJECTION;  Surgeon: Leandrew Koyanagi, MD;  Location: [place];  Service: Orthopedics;  Laterality: Left;   TONSILLECTOMY  as child   TOTAL KNEE ARTHROPLASTY Right 02/07/2022   Procedure: RIGHT TOTAL KNEE ARTHROPLASTY;  Surgeon: Newt Minion, MD;  Location: [facility];  Service: Orthopedics;  Laterality:  Right;   TRANSANAL HEMORRHOIDAL DEARTERIALIZATION N/A 10/27/2015   Procedure: TRANSANAL HEMORRHOIDAL DEARTERIALIZATION;  Surgeon: Michael Boston, MD;  Location[facility];  Service: General;  Laterality: N/A;   UPPER GASTROINTESTINAL ENDOSCOPY     Patient Active Problem List   Diagnosis Date Noted   Total knee replacement status, right 02/07/2022   Malaise and fatigue 12/05/2021   Prediabetes 02/23/2021   Hair loss 02/23/2021   Memory changes 03/01/2020   CKD (chronic kidney disease) 09/11/2019   HLD (hyperlipidemia) 09/09/2018   Allergic sinusitis 12/03/2017   Hx of AKA (above knee amputation), left ([place]) 02/18/2017   Unilateral primary osteoarthritis, right knee 02/18/2017   Vitamin D deficiency 05/04/2016   Bilateral primary osteoarthritis of hip 04/26/2016   Bilateral carpal tunnel syndrome 03/29/2016   Screening for cervical cancer 03/06/2016   PVD  (peripheral vascular disease) ([place]) 03/05/2016   Pancreatic abnormality 03/05/2016   Hypertension 03/05/2016   Hepatic cirrhosis ([place]) 11/15/2015   Chronic hepatitis C ([place]) 06/15/2015   Prolapsed internal hemorrhoids, grade 4 06/14/2015   Hx of osteosarcoma 06/14/2015    PCP: Sanjuan Dame, MD  REFERRING PROVIDER: Newt Minion, MD  REFERRING DIAG: [phone] (ICD-10-CM) - Status post total right knee replacement    THERAPY DIAG:  Stiffness of right knee, not elsewhere classified  Acute pain of right knee  Muscle weakness (generalized)  Other abnormalities of gait and mobility  Unsteadiness on feet  Localized edema  Rationale for Evaluation and Treatment: Rehabilitation  ONSET DATE: 02/07/22 TKA  SUBJECTIVE:   SUBJECTIVE STATEMENT: She is walking now without a limp.    PERTINENT HISTORY: left AKA, osteosarcoma, PVD, HTN, hepatic cirrhosis, hep C, CKD, HLD,   PAIN:  Are you having pain? Yes,  today  8/10 pain in Rt knee, in last week lowest 5/10 and highest 10/10 Sore Aggravating factors: walking on it Alleviating factors: getting off leg, tylenol, heating pads.  PRECAUTIONS: Fall  WEIGHT BEARING RESTRICTIONS: No  FALLS:  Has patient fallen in last 6 months? No  LIVING ENVIRONMENT: Lives with: lives with their spouse Lives in: House Stairs: No Has following equipment at home: Environmental consultant - 2 wheeled, Health visitor, Radio broadcast assistant (power), Electronics engineer, and Grab bars  OCCUPATION: disabled  RECREATION: active in church  PLOF: Independent, Independent with household mobility without device, and Independent with community mobility without device  prosthesis only.    PATIENT GOALS: get back to prior level, work in yard, be active in church handing out meals to homeless.   NEXT MD VISIT: 04/05/2022  OBJECTIVE:   DIAGNOSTIC FINDINGS:   PATIENT SURVEYS:  FOTO intake:  40%  predicted:  62%  COGNITION: Overall cognitive status: WFL   EDEMA:  RLE: above knee  40.3cm,  around knee 40cm,  below knee 34.5cm  which appears to be moderate edema LLE has AKA so unable to compare LEs  POSTURE: forward head and flexed trunk   PALPATION: Tenderness along right knee joint line and incision.  Tightness in ITB, quad, hamstring & gastroc.   LOWER EXTREMITY ROM:   ROM Right eval Rt 03/20/22 Right 03/28/22 Right 04/03/22 Right 04/09/22  Hip flexion       Hip extension       Hip abduction       Hip adduction       Hip internal rotation       Hip external rotation       Knee flexion Seated P: 111* A: 107* Supine P: 118 A: 115 Seated A:  112*    Knee extension Seated P: -20* A: -19*  supine A: -14* P: -10* Seated  A: LAQ -17* P: -12* Standing A: -5* Leg press With 50# A:-5*  Ankle dorsiflexion       Ankle plantarflexion       Ankle inversion       Ankle eversion        (Blank rows = not tested)  LOWER EXTREMITY MMT:  MMT Right eval Left eval  Hip flexion    Hip extension    Hip abduction    Hip adduction    Hip internal rotation    Hip external rotation    Knee flexion 3-/5   Knee extension 3-/5   Ankle dorsiflexion    Ankle plantarflexion    Ankle inversion    Ankle eversion     (Blank rows = not tested)  FUNCTIONAL TESTS:  18 inch chair transfer: requires UE assist on armrests.   GAIT: Distance walked: 10' Assistive device utilized: left Transfemoral prosthesis & no AD Level of assistance: SBA Comments: right knee flexed in stance   TODAY'S TREATMENT                                                                           [facility] Adult PT Treatment: DATE:  04/11/2022: Therapeutic Exercise: Standing gastroc stretrch forefoot against 4" step with forward lean 30 sec hold 2 reps Heel raise with LUE support //bar 15 reps Rocker board using knee ext to push back down 1 min.  Standing TKE red theraband with RW support 10 reps 3 sec hold Quad sets with knee ext varying degrees 5 sec hold 3 reps ea range for total 15  reps. LAQ 3# 15 reps.  Leg press 50# RLE with PT manual assist for knee ext 5 sec hold 15 reps 1 set back at 45* & 2 sets with back flat Eccentric quad on stairs with 2 rails 5 reps.   Manual therapy: PROM with overpressure for ext.   Mob belt for ext in standing.  Muscle stretches for hamstring & hip flexor manually.  04/09/2022: Therapeutic Exercise: Standing gastroc stretrch forefoot on 2" step 30 sec hold 2 reps Heel raise with LUE support //bar 15 reps Rocker board using knee ext to push back down 1 min.  Standing TKE red theraband with RW support 10 reps 3 sec hold Moving RLE floor to 8" stool for active knee flex to ext. Quad sets with knee ext varying degrees 5 sec hold 3 reps ea range for total 15 reps. LAQ active max range with strap assist end range 15 reps.  Leg press 50# RLE with PT manual assist for knee ext 5 sec hold 15 reps 1 set back at 45* & 2 sets with back flat Step up & step down using RLE 4" step with RW support 5 reps.  Pt improved control eccentrically with instruction.   Manual therapy: PROM with overpressure for ext.   Mob belt for ext in standing.  Muscle stretches for hamstring & hip flexor manually.   04/03/2022: Therapeutic Exercise: Standing gastroc stretrch forefoot on 2" step 30 sec hold 2 reps Heel raise with RW support 10 reps Standing TKE red theraband  with RW support 10 reps 3 sec hold Seated with RLE propped 5# 1 min 2 reps Moving RLE floor to 8" stool for active knee flex to ext. Leg press 50# RLE with PT manual assist for knee ext 5 sec hold 10 reps 1 set back at 45* & 2 sets with back flat Step up & step down using RLE 4" step with RW support 5 reps.  Pt improved control eccentrically with instruction.   Manual therapy: PROM with overpressure for ext.   Muscle stretches for hamstring & hip flexor manually.    PATIENT EDUCATION:  Education details: HEP, POC, reinforced AD use Person educated: Patient Education method: Explanation,  Demonstration, Verbal cues, and Handouts Education comprehension: verbalized understanding, returned demonstration, and verbal cues required  HOME EXERCISE PROGRAM: Access Code: [email or web address] URL: [email or web address] Date: 03/12/2022 Prepared by: Jamey Reas  Exercises - Quad Setting and Stretching  - 2-4 x daily - 7 x weekly - 5-10 sets - 10 reps - prop 5-10 minutes & quad set5 seconds hold - Supine Leg Press  - 2-4 x daily - 7 x weekly - 5-10 sets - 10 reps - 5 seconds hold - Supine Heel Slide with Strap  - 2-3 x daily - 7 x weekly - 2-3 sets - 10 reps - 5 seconds hold - Supine Knee Extension Strengthening  - 2-3 x daily - 7 x weekly - 2-3 sets - 10 reps - 5 seconds hold - Seated Knee Flexion Extension AROM   - 2-4 x daily - 7 x weekly - 2-3 sets - 10 reps - 5 seconds hold - Seated Hamstring Stretch with Strap  - 2-4 x daily - 7 x weekly - 1 sets - 3 reps - 20-30 seconds hold  ASSESSMENT:  CLINICAL IMPRESSION: Patient has improved functional RLE strength which has made her prosthesis too short now.  PT set up appt with prosthetist tomorrow at 3:00 for adjustments.  Pt is pleased with progress.    OBJECTIVE IMPAIRMENTS: Abnormal gait, decreased balance, decreased mobility, difficulty walking, decreased ROM, decreased strength, increased edema, increased muscle spasms, and pain.   ACTIVITY LIMITATIONS: carrying, lifting, standing, sleeping, stairs, transfers, and locomotion level  PARTICIPATION LIMITATIONS: meal prep, community activity, and church  PERSONAL FACTORS: Fitness, Time since onset of injury/illness/exacerbation, and 3+ comorbidities: see PMH  are also affecting patient's functional outcome.   REHAB POTENTIAL: Good  CLINICAL DECISION MAKING: Stable/uncomplicated  EVALUATION COMPLEXITY: Low   GOALS: Goals reviewed with patient? Yes  SHORT TERM GOALS: (target date for Short term goals are 3 weeks 03/30/2022)   1.  Patient will demonstrate independent  use of home exercise program to maintain progress from in clinic treatments.  Goal status: MET 03/20/22  LONG TERM GOALS: (target dates for all long term goals are 6 weeks  04/20/2022 )   1. Patient will demonstrate/report pain at worst less than or equal to 2/10 to facilitate minimal limitation in daily activity secondary to pain symptoms.  Goal status: New   2. Patient will demonstrate independent use of home exercise program to facilitate ability to maintain/progress functional gains from skilled physical therapy services.  Goal status: New   3. Patient will demonstrate FOTO outcome > or = 62 % to indicate reduced disability due to condition.  Goal status: New   4.  Right knee extension standing -5* AROM.   Goal status: New   5.  Patient ambulates >200' with prosthesis & LRAD modified independent. Goal status: New   6.  pt negotiates ramps & curbs with prosthesis & LRAD modified independent. Goal status: New     PLAN:  PT FREQUENCY: 2x/week  PT DURATION: 6 weeks  PLANNED INTERVENTIONS: Therapeutic exercises, Therapeutic activity, Neuro Muscular re-education, Balance training, Gait training, Patient/Family education, Joint mobilization, Stair training, DME instructions, Dry Needling, Electrical stimulation, Traction, Cryotherapy, vasopneumatic deviceMoist heat, Taping, Ultrasound, Ionotophoresis '4mg'$ /ml Dexamethasone, and Manual therapy.  All included unless contraindicated  PLAN FOR NEXT SESSION: check on changes to prosthesis and function / gait.  Check LTGs next week with plans to discharge.  Work on ext range & strength.     Jamey Reas, PT, DPT 04/11/2022, 1:43 PM

## 2022-04-12 ENCOUNTER — Ambulatory Visit (HOSPITAL_COMMUNITY)
Admission: RE | Admit: 2022-04-12 | Discharge: 2022-04-12 | Disposition: A | Discharge status: Home / Self Care | Payer: BC Managed Care – PPO | Source: Ambulatory Visit | Attending: Gastroenterology | Admitting: Gastroenterology

## 2022-04-12 DIAGNOSIS — K7460 Unspecified cirrhosis of liver: Secondary | ICD-10-CM

## 2022-04-13 ENCOUNTER — Telehealth: Payer: Self-pay | Admitting: Orthopedic Surgery

## 2022-04-13 NOTE — Telephone Encounter (Signed)
Patient called in stating she needs a Rx for a new Prothesis patient has PT with Robin on Monday and Tuesday would like to pick it up then

## 2022-04-13 NOTE — Telephone Encounter (Signed)
Holding for Autumn/Duda for Monday.

## 2022-04-16 ENCOUNTER — Encounter: Payer: Self-pay | Admitting: Physical Therapy

## 2022-04-16 ENCOUNTER — Ambulatory Visit (INDEPENDENT_AMBULATORY_CARE_PROVIDER_SITE_OTHER): Payer: BC Managed Care – PPO | Admitting: Physical Therapy

## 2022-04-16 DIAGNOSIS — M25661 Stiffness of right knee, not elsewhere classified: Secondary | ICD-10-CM | POA: Diagnosis not present

## 2022-04-16 DIAGNOSIS — M6281 Muscle weakness (generalized): Secondary | ICD-10-CM

## 2022-04-16 DIAGNOSIS — R2689 Other abnormalities of gait and mobility: Secondary | ICD-10-CM | POA: Diagnosis not present

## 2022-04-16 DIAGNOSIS — M25561 Pain in right knee: Secondary | ICD-10-CM | POA: Diagnosis not present

## 2022-04-16 DIAGNOSIS — R2681 Unsteadiness on feet: Secondary | ICD-10-CM

## 2022-04-16 DIAGNOSIS — R600 Localized edema: Secondary | ICD-10-CM

## 2022-04-16 NOTE — Therapy (Signed)
OUTPATIENT PHYSICAL THERAPY TREATMENT   Patient Name: Stacy Thomas MRN: [id number] DOB:27-Oct-1960, 62 y.o., female Today's Date: 04/16/2022  END OF SESSION:  PT End of Session - 04/16/22 1356     Visit Number 10    Number of Visits 12    Date for PT Re-Evaluation 04/20/22    Authorization Type BCBS    Authorization Time Period $150 COPAY, 17 PT VISITS REMAIN OUT OF 30 COMBINED PT/OT/CHIRO PER SERVICE YEAR    Authorization - Number of Visits 17    Progress Note Due on Visit 10    PT Start Time 1353    PT Stop Time 1420    PT Time Calculation (min) 27 min    Activity Tolerance Patient tolerated treatment well    Behavior During Therapy WFL for tasks assessed/performed                 Past Medical History:  Diagnosis Date   Bilateral arm pain    chronic, due to use of crutches   Dependence on crutches    left leg prosthesis   Drug addiction in remission ([place])    since 2007  (crack cocaine,  IV drug use)   Hand weakness    Hepatic cirrhosis ([place]) 2018   followed by dr Havery Moros (gi) due to hx chronic hepatitis c   Hiatal hernia    History of esophageal stricture    post dilation x2   History of hepatitis C    pt completed harvoni treatment 2017, cured;  genotype 1a with fibrosis 3/4,  previously seen by dr comer   History of osteosarcoma    1982  left knee---  s/p  AKA LLE--   per pt no recurrence   History of recreational drug use multiple rehab visits   IV drug use for 1 yr and Smoked crack for 30+yrs--  per pt clean since 2007   Hyperlipidemia    Hypertension    Idiopathic peripheral neuropathy    OSA (obstructive sleep apnea)    study in epic 10-27-2018 by dr Rexene Alberts,  moderate osa,  pt stated used cpap until approx. 07/ 2021, intolerate of mask   Osteoarthritis    Pancreatic cyst    followed by dr Havery Moros--- per office note bening and stable   Sigmoid diverticulosis    mild   Wears glasses    Past Surgical History:  Procedure Laterality Date    ABOVE KNEE LEG AMPUTATION Left 1982   osteosarcoma   BREAST BIOPSY Left 01/2021   x2   CARPAL TUNNEL RELEASE Right 10/18/2021   Procedure: RIGHT CARPAL TUNNEL RELEASE;  Surgeon: Leandrew Koyanagi, MD;  Location: [place];  Service: Orthopedics;  Laterality: Right;   COLONOSCOPY WITH PROPOFOL  last one 04-28-2020  dr Havery Moros   ESOPHAGOGASTRODUODENOSCOPY (EGD) WITH PROPOFOL  last one 11-02-2019  dr Havery Moros   EXCISION BREAST BX  2015   CYST   EXCISION OF SKIN TAG N/A 07/14/2020   Procedure: EXCISION OF ANAL PAPILLA;  Surgeon: Leighton Ruff, MD;  Location: [facility];  Service: General;  Laterality: N/A;   STERIOD INJECTION Left 10/18/2021   Procedure: LEFT CARPAL TUNNEL INJECTION;  Surgeon: Leandrew Koyanagi, MD;  Location: [place];  Service: Orthopedics;  Laterality: Left;   TONSILLECTOMY  as child   TOTAL KNEE ARTHROPLASTY Right 02/07/2022   Procedure: RIGHT TOTAL KNEE ARTHROPLASTY;  Surgeon: Newt Minion, MD;  Location: [place];  Service: Orthopedics;  Laterality:  Right;   TRANSANAL HEMORRHOIDAL DEARTERIALIZATION N/A 10/27/2015   Procedure: TRANSANAL HEMORRHOIDAL DEARTERIALIZATION;  Surgeon: Michael Boston, MD;  Location[facility];  Service: General;  Laterality: N/A;   UPPER GASTROINTESTINAL ENDOSCOPY     Patient Active Problem List   Diagnosis Date Noted   Total knee replacement status, right 02/07/2022   Malaise and fatigue 12/05/2021   Prediabetes 02/23/2021   Hair loss 02/23/2021   Memory changes 03/01/2020   CKD (chronic kidney disease) 09/11/2019   HLD (hyperlipidemia) 09/09/2018   Allergic sinusitis 12/03/2017   Hx of AKA (above knee amputation), left ([place]) 02/18/2017   Unilateral primary osteoarthritis, right knee 02/18/2017   Vitamin D deficiency 05/04/2016   Bilateral primary osteoarthritis of hip 04/26/2016   Bilateral carpal tunnel syndrome 03/29/2016   Screening for cervical cancer 03/06/2016   PVD  (peripheral vascular disease) ([place]) 03/05/2016   Pancreatic abnormality 03/05/2016   Hypertension 03/05/2016   Hepatic cirrhosis ([place]) 11/15/2015   Chronic hepatitis C ([place]) 06/15/2015   Prolapsed internal hemorrhoids, grade 4 06/14/2015   Hx of osteosarcoma 06/14/2015    PCP: Sanjuan Dame, MD  REFERRING PROVIDER: Newt Minion, MD  REFERRING DIAG: [phone] (ICD-10-CM) - Status post total right knee replacement    THERAPY DIAG:  Stiffness of right knee, not elsewhere classified  Acute pain of right knee  Muscle weakness (generalized)  Other abnormalities of gait and mobility  Unsteadiness on feet  Localized edema  Rationale for Evaluation and Treatment: Rehabilitation  ONSET DATE: 02/07/22 TKA  SUBJECTIVE:   SUBJECTIVE STATEMENT: She saw prosthetist after last PT. He lengthened prosthesis as her Rt knee is now straighter & gave new socks.  She is working on getting prescription for new prosthesis.  She fell yesterday when prosthesis did not extend and hit her right knee which is sore now.    PERTINENT HISTORY: left AKA, osteosarcoma, PVD, HTN, hepatic cirrhosis, hep C, CKD, HLD,   PAIN:  Are you having pain? Yes,  today  9/10 pain in Rt knee, in last week lowest 8.5/10 and highest 10/10 Sore Aggravating factors: walking on it Alleviating factors: getting off leg, tylenol, heating pads.  PRECAUTIONS: Fall  WEIGHT BEARING RESTRICTIONS: No  FALLS:  Has patient fallen in last 6 months? No  LIVING ENVIRONMENT: Lives with: lives with their spouse Lives in: House Stairs: No Has following equipment at home: Environmental consultant - 2 wheeled, Health visitor, Radio broadcast assistant (power), Electronics engineer, and Grab bars  OCCUPATION: disabled  RECREATION: active in church  PLOF: Independent, Independent with household mobility without device, and Independent with community mobility without device  prosthesis only.    PATIENT GOALS: get back to prior level, work in yard, be active in church  handing out meals to homeless.   NEXT MD VISIT: 04/05/2022  OBJECTIVE:   DIAGNOSTIC FINDINGS:   PATIENT SURVEYS:  FOTO intake:  40%  predicted:  62%  COGNITION: Overall cognitive status: WFL   EDEMA:  RLE: above knee 40.3cm,  around knee 40cm,  below knee 34.5cm  which appears to be moderate edema LLE has AKA so unable to compare LEs  POSTURE: forward head and flexed trunk   PALPATION: Tenderness along right knee joint line and incision.  Tightness in ITB, quad, hamstring & gastroc.   LOWER EXTREMITY ROM:   ROM Right eval Rt 03/20/22 Right 03/28/22 Right 04/03/22 Right 04/09/22  Hip flexion       Hip extension       Hip abduction  Hip adduction       Hip internal rotation       Hip external rotation       Knee flexion Seated P: 111* A: 107* Supine P: 118 A: 115 Seated A: 112*    Knee extension Seated P: -20* A: -19*  supine A: -14* P: -10* Seated  A: LAQ -17* P: -12* Standing A: -5* Leg press With 50# A:-5*  Ankle dorsiflexion       Ankle plantarflexion       Ankle inversion       Ankle eversion        (Blank rows = not tested)  LOWER EXTREMITY MMT:  MMT Right eval Left eval  Hip flexion    Hip extension    Hip abduction    Hip adduction    Hip internal rotation    Hip external rotation    Knee flexion 3-/5   Knee extension 3-/5   Ankle dorsiflexion    Ankle plantarflexion    Ankle inversion    Ankle eversion     (Blank rows = not tested)  FUNCTIONAL TESTS:  18 inch chair transfer: requires UE assist on armrests.   GAIT: Distance walked: 10' Assistive device utilized: left Transfemoral prosthesis & no AD Level of assistance: SBA Comments: right knee flexed in stance   TODAY'S TREATMENT                                                                           [facility] Adult PT Treatment: DATE:  04/16/2022: Therapeutic Exercise: Leg press 50# RLE with PT manual assist for knee ext 5 sec hold 15 reps 2 sets Standing gastroc stretrch  forefoot against 4" step with forward lean 30 sec hold 2 reps Heel raise with LUE support //bar 15 reps Rocker board using knee ext to push back down 1 min.  Standing TKE red theraband with RW support 10 reps 3 sec hold Quad sets with knee ext varying degrees 5 sec hold 3 reps ea range for total 15 reps.   Manual therapy: Right knee has increased edema anterior at distal patella and increased tenderness. PT set up appt with Dondra Prader, NP with Dr. Sharol Given for tomorrow at [facility] to assess knee & write Rx for new prosthesis.   04/11/2022: Therapeutic Exercise: Standing gastroc stretrch forefoot against 4" step with forward lean 30 sec hold 2 reps Heel raise with LUE support //bar 15 reps Rocker board using knee ext to push back down 1 min.  Standing TKE red theraband with RW support 10 reps 3 sec hold Quad sets with knee ext varying degrees 5 sec hold 3 reps ea range for total 15 reps. LAQ 3# 15 reps.  Leg press 50# RLE with PT manual assist for knee ext 5 sec hold 15 reps 1 set back at 45* & 2 sets with back flat Eccentric quad on stairs with 2 rails 5 reps.   Manual therapy: PROM with overpressure for ext.   Mob belt for ext in standing.  Muscle stretches for hamstring & hip flexor manually.  04/09/2022: Therapeutic Exercise: Standing gastroc stretrch forefoot on 2" step 30 sec hold 2 reps Heel raise with LUE support //bar 15 reps  Rocker board using knee ext to push back down 1 min.  Standing TKE red theraband with RW support 10 reps 3 sec hold Moving RLE floor to 8" stool for active knee flex to ext. Quad sets with knee ext varying degrees 5 sec hold 3 reps ea range for total 15 reps. LAQ active max range with strap assist end range 15 reps.  Leg press 50# RLE with PT manual assist for knee ext 5 sec hold 15 reps 1 set back at 45* & 2 sets with back flat Step up & step down using RLE 4" step with RW support 5 reps.  Pt improved control eccentrically with instruction.   Manual  therapy: PROM with overpressure for ext.   Mob belt for ext in standing.  Muscle stretches for hamstring & hip flexor manually.    PATIENT EDUCATION:  Education details: HEP, POC, reinforced AD use Person educated: Patient Education method: Explanation, Demonstration, Verbal cues, and Handouts Education comprehension: verbalized understanding, returned demonstration, and verbal cues required  HOME EXERCISE PROGRAM: Access Code: [email or web address] URL: [email or web address] Date: 03/12/2022 Prepared by: Jamey Reas  Exercises - Quad Setting and Stretching  - 2-4 x daily - 7 x weekly - 5-10 sets - 10 reps - prop 5-10 minutes & quad set5 seconds hold - Supine Leg Press  - 2-4 x daily - 7 x weekly - 5-10 sets - 10 reps - 5 seconds hold - Supine Heel Slide with Strap  - 2-3 x daily - 7 x weekly - 2-3 sets - 10 reps - 5 seconds hold - Supine Knee Extension Strengthening  - 2-3 x daily - 7 x weekly - 2-3 sets - 10 reps - 5 seconds hold - Seated Knee Flexion Extension AROM   - 2-4 x daily - 7 x weekly - 2-3 sets - 10 reps - 5 seconds hold - Seated Hamstring Stretch with Strap  - 2-4 x daily - 7 x weekly - 1 sets - 3 reps - 20-30 seconds hold  ASSESSMENT:  CLINICAL IMPRESSION: Pt fell on right knee yesterday. Knee appears only a contusion but PT set up appt with NP for assessment tomorrow.  Pt was limited in tolerance for PT exercises & activities due to pain.    OBJECTIVE IMPAIRMENTS: Abnormal gait, decreased balance, decreased mobility, difficulty walking, decreased ROM, decreased strength, increased edema, increased muscle spasms, and pain.   ACTIVITY LIMITATIONS: carrying, lifting, standing, sleeping, stairs, transfers, and locomotion level  PARTICIPATION LIMITATIONS: meal prep, community activity, and church  PERSONAL FACTORS: Fitness, Time since onset of injury/illness/exacerbation, and 3+ comorbidities: see PMH  are also affecting patient's functional outcome.   REHAB  POTENTIAL: Good  CLINICAL DECISION MAKING: Stable/uncomplicated  EVALUATION COMPLEXITY: Low   GOALS: Goals reviewed with patient? Yes  SHORT TERM GOALS: (target date for Short term goals are 3 weeks 03/30/2022)   1.  Patient will demonstrate independent use of home exercise program to maintain progress from in clinic treatments.  Goal status: MET 03/20/22  LONG TERM GOALS: (target dates for all long term goals are 6 weeks  04/20/2022 )   1. Patient will demonstrate/report pain at worst less than or equal to 2/10 to facilitate minimal limitation in daily activity secondary to pain symptoms.  Goal status: New   2. Patient will demonstrate independent use of home exercise program to facilitate ability to maintain/progress functional gains from skilled physical therapy services.  Goal status: New   3. Patient will demonstrate FOTO outcome > or = 62 %  to indicate reduced disability due to condition.  Goal status: New   4.  Right knee extension standing -5* AROM.   Goal status: New   5.  Patient ambulates >200' with prosthesis & LRAD modified independent. Goal status: New   6.  pt negotiates ramps & curbs with prosthesis & LRAD modified independent. Goal status: New     PLAN:  PT FREQUENCY: 2x/week  PT DURATION: 6 weeks  PLANNED INTERVENTIONS: Therapeutic exercises, Therapeutic activity, Neuro Muscular re-education, Balance training, Gait training, Patient/Family education, Joint mobilization, Stair training, DME instructions, Dry Needling, Electrical stimulation, Traction, Cryotherapy, vasopneumatic deviceMoist heat, Taping, Ultrasound, Ionotophoresis '4mg'$ /ml Dexamethasone, and Manual therapy.  All included unless contraindicated  PLAN FOR NEXT SESSION: check Erin Zamora's note.  Check LTGs with plans to discharge.     Jamey Reas, PT, DPT 04/16/2022, 2:23 PM

## 2022-04-16 NOTE — Telephone Encounter (Signed)
Can you please write rx for new prosthetic for this pt.

## 2022-04-16 NOTE — Telephone Encounter (Signed)
Advised patient she will get the Rx when she comes in for her appt tomorrow with Junie Panning

## 2022-04-17 ENCOUNTER — Telehealth: Payer: Self-pay | Admitting: Family

## 2022-04-17 ENCOUNTER — Ambulatory Visit (INDEPENDENT_AMBULATORY_CARE_PROVIDER_SITE_OTHER): Payer: BC Managed Care – PPO | Admitting: Family

## 2022-04-17 ENCOUNTER — Telehealth: Payer: Self-pay

## 2022-04-17 DIAGNOSIS — Z89612 Acquired absence of left leg above knee: Secondary | ICD-10-CM

## 2022-04-17 NOTE — Telephone Encounter (Signed)
Patient cancelled appointment. States knee feels fine and doesn't feel she needs to be seen.

## 2022-04-17 NOTE — Telephone Encounter (Signed)
Patient stated she will come back tomorrow for her script for her proctitis. She was here on today and stated she did not need her appt and left but came back later and wanted her script. She said she will come back tomorrow for it. [phone]

## 2022-04-17 NOTE — Progress Notes (Deleted)
Office Visit Note   Patient: Stacy Thomas           Date of Birth: 1961/01/31           MRN: [id number] Visit Date: 04/17/2022              Requested by: Sanjuan Dame, MD [address] [place],  [place] [id number] PCP: Sanjuan Dame, MD  Chief Complaint  Patient presents with   Right Knee - Follow-up      HPI: ***  Assessment & Plan: Visit Diagnoses: No diagnosis found.  Plan: ***  Follow-Up Instructions: No follow-ups on file.   Ortho Exam  Patient is alert, oriented, no adenopathy, well-dressed, normal affect, normal respiratory effort. ***  Imaging: No results found. No images are attached to the encounter.  Labs: Lab Results  Component Value Date   HGBA1C 5.8 (H) 12/05/2021   HGBA1C 5.9 (H) 02/20/2021   HGBA1C 5.9 (H) 02/29/2020     Lab Results  Component Value Date   ALBUMIN 3.8 01/29/2022   ALBUMIN 4.6 12/05/2021   ALBUMIN 4.5 11/10/2021    No results found for: "MG" Lab Results  Component Value Date   VD25OH 15.8 (L) 05/03/2016    No results found for: "PREALBUMIN"    Latest Ref Rng & Units 01/29/2022    8:47 AM 12/05/2021    9:56 AM 11/10/2021    9:39 AM  CBC EXTENDED  WBC 4.0 - 10.5 K/uL 4.1  4.2  4.1   RBC 3.87 - 5.11 MIL/uL 4.56  4.53  4.43   Hemoglobin 12.0 - 15.0 g/dL 13.7  13.5  13.2   HCT 36.0 - 46.0 % 40.8  39.8  38.6   Platelets 150 - 400 K/uL 215  210  225.0   NEUT# 1.4 - 7.0 x10E3/uL  1.8  1.4   Lymph# 0.7 - 3.1 x10E3/uL  1.9  2.2      There is no height or weight on file to calculate BMI.  Orders:  No orders of the defined types were placed in this encounter.  No orders of the defined types were placed in this encounter.    Procedures: No procedures performed  Clinical Data: No additional findings.  ROS:  All other systems negative, except as noted in the HPI. Review of Systems  Objective: Vital Signs: LMP 12/02/2010   Specialty Comments:  No specialty comments available.  PMFS  History: Patient Active Problem List   Diagnosis Date Noted   Total knee replacement status, right 02/07/2022   Malaise and fatigue 12/05/2021   Prediabetes 02/23/2021   Hair loss 02/23/2021   Memory changes 03/01/2020   CKD (chronic kidney disease) 09/11/2019   HLD (hyperlipidemia) 09/09/2018   Allergic sinusitis 12/03/2017   Hx of AKA (above knee amputation), left ([place]) 02/18/2017   Unilateral primary osteoarthritis, right knee 02/18/2017   Vitamin D deficiency 05/04/2016   Bilateral primary osteoarthritis of hip 04/26/2016   Bilateral carpal tunnel syndrome 03/29/2016   Screening for cervical cancer 03/06/2016   PVD (peripheral vascular disease) ([place]) 03/05/2016   Pancreatic abnormality 03/05/2016   Hypertension 03/05/2016   Hepatic cirrhosis ([place]) 11/15/2015   Chronic hepatitis C ([place]) 06/15/2015   Prolapsed internal hemorrhoids, grade 4 06/14/2015   Hx of osteosarcoma 06/14/2015   Past Medical History:  Diagnosis Date   Bilateral arm pain    chronic, due to use of crutches   Dependence on crutches    left leg prosthesis   Drug addiction in  remission ([place])    since 2007  (crack cocaine,  IV drug use)   Hand weakness    Hepatic cirrhosis ([place]) 2018   followed by dr Havery Moros ([facility]) due to hx chronic hepatitis c   Hiatal hernia    History of esophageal stricture    post dilation x2   History of hepatitis C    pt completed harvoni treatment 2017, cured;  genotype 1a with fibrosis 3/4,  previously seen by dr comer   History of osteosarcoma    1982  left knee---  s/p  AKA LLE--   per pt no recurrence   History of recreational drug use multiple rehab visits   IV drug use for 1 yr and Smoked crack for 30+yrs--  per pt clean since 2007   Hyperlipidemia    Hypertension    Idiopathic peripheral neuropathy    OSA (obstructive sleep apnea)    study in epic 10-27-2018 by dr Rexene Alberts,  moderate osa,  pt stated used cpap until approx. 07/ 2021, intolerate of mask   Osteoarthritis     Pancreatic cyst    followed by dr Havery Moros--- per office note bening and stable   Sigmoid diverticulosis    mild   Wears glasses     Family History  Problem Relation Age of Onset   COPD Father    Alcohol abuse Father        Deceased   Hypertension Mother        Living   Healthy Daughter    Healthy Son    Anesthesia problems Neg Hx    Breast cancer Neg Hx    Colon cancer Neg Hx    Esophageal cancer Neg Hx    Rectal cancer Neg Hx    Stomach cancer Neg Hx    Colon polyps Neg Hx     Past Surgical History:  Procedure Laterality Date   ABOVE KNEE LEG AMPUTATION Left 1982   osteosarcoma   BREAST BIOPSY Left 01/2021   x2   CARPAL TUNNEL RELEASE Right 10/18/2021   Procedure: RIGHT CARPAL TUNNEL RELEASE;  Surgeon: Leandrew Koyanagi, MD;  Location: [place];  Service: Orthopedics;  Laterality: Right;   COLONOSCOPY WITH PROPOFOL  last one 04-28-2020  dr Havery Moros   ESOPHAGOGASTRODUODENOSCOPY (EGD) WITH PROPOFOL  last one 11-02-2019  dr Havery Moros   EXCISION BREAST BX  2015   CYST   EXCISION OF SKIN TAG N/A 07/14/2020   Procedure: EXCISION OF ANAL PAPILLA;  Surgeon: Leighton Ruff, MD;  Location[facility];  Service: General;  Laterality: N/A;   STERIOD INJECTION Left 10/18/2021   Procedure: LEFT CARPAL TUNNEL INJECTION;  Surgeon: Leandrew Koyanagi, MD;  Location: [place];  Service: Orthopedics;  Laterality: Left;   TONSILLECTOMY  as child   TOTAL KNEE ARTHROPLASTY Right 02/07/2022   Procedure: RIGHT TOTAL KNEE ARTHROPLASTY;  Surgeon: Newt Minion, MD;  Location: [place];  Service: Orthopedics;  Laterality: Right;   TRANSANAL HEMORRHOIDAL DEARTERIALIZATION N/A 10/27/2015   Procedure: TRANSANAL HEMORRHOIDAL DEARTERIALIZATION;  Surgeon: Michael Boston, MD;  Location: [place];  Service: General;  Laterality: N/A;   UPPER GASTROINTESTINAL ENDOSCOPY     Social History   Occupational History   Not on file  Tobacco Use    Smoking status: Former    Packs/day: 1.00    Years: 37.00    Total pack years: 37.00    Types: Cigarettes    Start date: 04/02/1973  Quit date: 12/19/2014    Years since quitting: 7.3   Smokeless tobacco: Never  Vaping Use   Vaping Use: Never used  Substance and Sexual Activity   Alcohol use: Never    Alcohol/week: 0.0 standard drinks of alcohol   Drug use: Not Currently    Types: "Crack" cocaine    Comment: recovery addict since 2007   Sexual activity: Not on file

## 2022-04-18 ENCOUNTER — Ambulatory Visit (INDEPENDENT_AMBULATORY_CARE_PROVIDER_SITE_OTHER): Payer: BC Managed Care – PPO | Admitting: Physical Therapy

## 2022-04-18 ENCOUNTER — Encounter: Payer: Self-pay | Admitting: Physical Therapy

## 2022-04-18 DIAGNOSIS — M25661 Stiffness of right knee, not elsewhere classified: Secondary | ICD-10-CM | POA: Diagnosis not present

## 2022-04-18 DIAGNOSIS — R2689 Other abnormalities of gait and mobility: Secondary | ICD-10-CM

## 2022-04-18 DIAGNOSIS — M6281 Muscle weakness (generalized): Secondary | ICD-10-CM | POA: Diagnosis not present

## 2022-04-18 DIAGNOSIS — R600 Localized edema: Secondary | ICD-10-CM

## 2022-04-18 DIAGNOSIS — M25561 Pain in right knee: Secondary | ICD-10-CM | POA: Diagnosis not present

## 2022-04-18 DIAGNOSIS — R2681 Unsteadiness on feet: Secondary | ICD-10-CM

## 2022-04-18 NOTE — Therapy (Signed)
OUTPATIENT PHYSICAL THERAPY TREATMENT & DISCHARGE SUMMARY   Patient Name: Stacy Thomas MRN: [id number] DOB:05-Jun-1960, 62 y.o., female Today's Date: 04/18/2022  PHYSICAL THERAPY DISCHARGE SUMMARY  Visits from Start of Care: 11  Current functional level related to goals / functional outcomes: See below   Remaining deficits: See below   Education / Equipment: HEP with need for ongoing exercise,  prescription for new prosthesis.    Patient agrees to discharge. Patient goals were met except pain goal. Patient is being discharged due to meeting the stated rehab goals.   END OF SESSION:  PT End of Session - 04/18/22 1430     Visit Number 11    Number of Visits 12    Date for PT Re-Evaluation 04/20/22    Authorization Type BCBS    Authorization Time Period $150 COPAY, 17 PT VISITS REMAIN OUT OF 30 COMBINED PT/OT/CHIRO PER SERVICE YEAR    Authorization - Number of Visits 7    PT Start Time 2992    PT Stop Time 1454    PT Time Calculation (min) 23 min    Activity Tolerance Patient tolerated treatment well    Behavior During Therapy WFL for tasks assessed/performed                  Past Medical History:  Diagnosis Date   Bilateral arm pain    chronic, due to use of crutches   Dependence on crutches    left leg prosthesis   Drug addiction in remission ([place])    since 2007  (crack cocaine,  IV drug use)   Hand weakness    Hepatic cirrhosis ([place]) 2018   followed by dr Havery Moros (gi) due to hx chronic hepatitis c   Hiatal hernia    History of esophageal stricture    post dilation x2   History of hepatitis C    pt completed harvoni treatment 2017, cured;  genotype 1a with fibrosis 3/4,  previously seen by dr comer   History of osteosarcoma    1982  left knee---  s/p  AKA LLE--   per pt no recurrence   History of recreational drug use multiple rehab visits   IV drug use for 1 yr and Smoked crack for 30+yrs--  per pt clean since 2007   Hyperlipidemia     Hypertension    Idiopathic peripheral neuropathy    OSA (obstructive sleep apnea)    study in epic 10-27-2018 by dr Rexene Alberts,  moderate osa,  pt stated used cpap until approx. 07/ 2021, intolerate of mask   Osteoarthritis    Pancreatic cyst    followed by dr Havery Moros--- per office note bening and stable   Sigmoid diverticulosis    mild   Wears glasses    Past Surgical History:  Procedure Laterality Date   ABOVE KNEE LEG AMPUTATION Left 1982   osteosarcoma   BREAST BIOPSY Left 01/2021   x2   CARPAL TUNNEL RELEASE Right 10/18/2021   Procedure: RIGHT CARPAL TUNNEL RELEASE;  Surgeon: Leandrew Koyanagi, MD;  Location: [facility];  Service: Orthopedics;  Laterality: Right;   COLONOSCOPY WITH PROPOFOL  last one 04-28-2020  dr Havery Moros   ESOPHAGOGASTRODUODENOSCOPY (EGD) WITH PROPOFOL  last one 11-02-2019  dr Havery Moros   EXCISION BREAST BX  2015   CYST   EXCISION OF SKIN TAG N/A 07/14/2020   Procedure: EXCISION OF ANAL PAPILLA;  Surgeon: Leighton Ruff, MD;  Location: [facility];  Service: General;  Laterality:  N/A;   STERIOD INJECTION Left 10/18/2021   Procedure: LEFT CARPAL TUNNEL INJECTION;  Surgeon: Leandrew Koyanagi, MD;  Location: [place];  Service: Orthopedics;  Laterality: Left;   TONSILLECTOMY  as child   TOTAL KNEE ARTHROPLASTY Right 02/07/2022   Procedure: RIGHT TOTAL KNEE ARTHROPLASTY;  Surgeon: Newt Minion, MD;  Location: [facility];  Service: Orthopedics;  Laterality: Right;   TRANSANAL HEMORRHOIDAL DEARTERIALIZATION N/A 10/27/2015   Procedure: TRANSANAL HEMORRHOIDAL DEARTERIALIZATION;  Surgeon: Michael Boston, MD;  Location: [place];  Service: General;  Laterality: N/A;   UPPER GASTROINTESTINAL ENDOSCOPY     Patient Active Problem List   Diagnosis Date Noted   Total knee replacement status, right 02/07/2022   Malaise and fatigue 12/05/2021   Prediabetes 02/23/2021   Hair loss 02/23/2021   Memory changes 03/01/2020    CKD (chronic kidney disease) 09/11/2019   HLD (hyperlipidemia) 09/09/2018   Allergic sinusitis 12/03/2017   Hx of AKA (above knee amputation), left ([place]) 02/18/2017   Unilateral primary osteoarthritis, right knee 02/18/2017   Vitamin D deficiency 05/04/2016   Bilateral primary osteoarthritis of hip 04/26/2016   Bilateral carpal tunnel syndrome 03/29/2016   Screening for cervical cancer 03/06/2016   PVD (peripheral vascular disease) ([place]) 03/05/2016   Pancreatic abnormality 03/05/2016   Hypertension 03/05/2016   Hepatic cirrhosis ([place]) 11/15/2015   Chronic hepatitis C ([place]) 06/15/2015   Prolapsed internal hemorrhoids, grade 4 06/14/2015   Hx of osteosarcoma 06/14/2015    PCP: Sanjuan Dame, MD  REFERRING PROVIDER: Newt Minion, MD  REFERRING DIAG: [phone] (ICD-10-CM) - Status post total right knee replacement    THERAPY DIAG:  Stiffness of right knee, not elsewhere classified  Acute pain of right knee  Other abnormalities of gait and mobility  Muscle weakness (generalized)  Unsteadiness on feet  Localized edema  Rationale for Evaluation and Treatment: Rehabilitation  ONSET DATE: 02/07/22 TKA  SUBJECTIVE:   SUBJECTIVE STATEMENT: She decided that she did not need an X-ray.      PERTINENT HISTORY: left AKA, osteosarcoma, PVD, HTN, hepatic cirrhosis, hep C, CKD, HLD,   PAIN:  Are you having pain? Yes,  today  7.5/10 pain in Rt knee, in last week lowest 7.5/10 and highest 10/10 Sore Aggravating factors: walking on it Alleviating factors: getting off leg, tylenol, heating pads.  PRECAUTIONS: Fall  WEIGHT BEARING RESTRICTIONS: No  FALLS:  Has patient fallen in last 6 months? No  LIVING ENVIRONMENT: Lives with: lives with their spouse Lives in: House Stairs: No Has following equipment at home: Environmental consultant - 2 wheeled, Health visitor, Radio broadcast assistant (power), Electronics engineer, and Grab bars  OCCUPATION: disabled  RECREATION: active in church  PLOF: Independent,  Independent with household mobility without device, and Independent with community mobility without device  prosthesis only.    PATIENT GOALS: get back to prior level, work in yard, be active in church handing out meals to homeless.   NEXT MD VISIT: 04/05/2022  OBJECTIVE:   DIAGNOSTIC FINDINGS:   PATIENT SURVEYS:  04/18/2022: FOTO 99%  03/12/2022:  FOTO intake:  40%  predicted:  62%  COGNITION: Overall cognitive status: WFL   EDEMA:  03/12/2022: RLE: above knee 40.3cm,  around knee 40cm,  below knee 34.5cm  which appears to be moderate edema LLE has AKA so unable to compare LEs  POSTURE: forward head and flexed trunk   PALPATION: 03/12/2022: Tenderness along right knee joint line and incision.  Tightness in ITB, quad, hamstring & gastroc.   LOWER EXTREMITY  ROM:   ROM Right eval Rt 03/20/22 Right 03/28/22 Right 04/03/22 Right 04/09/22 Right 04/18/22  Hip flexion        Hip extension        Hip abduction        Hip adduction        Hip internal rotation        Hip external rotation        Knee flexion Seated P: 111* A: 107* Supine P: 118 A: 115 Seated A: 112*   Seated A: 115*  Knee extension Seated P: -20* A: -19*  supine A: -14* P: -10* Seated  A: LAQ -17* P: -12* Standing A: -5* Leg press With 50# A:-5* Standing A: -3* Seated  A: LAQ -16*  Ankle dorsiflexion        Ankle plantarflexion        Ankle inversion        Ankle eversion         (Blank rows = not tested)  LOWER EXTREMITY MMT:  MMT Right Eval 03/12/2022: Right 04/18/22  Hip flexion    Hip extension    Hip abduction    Hip adduction    Hip internal rotation    Hip external rotation    Knee flexion 3-/5 19.7# & 23.7#  Knee extension 3-/5 30.0# & 31.1#  Ankle dorsiflexion    Ankle plantarflexion    Ankle inversion    Ankle eversion     (Blank rows = not tested)  FUNCTIONAL TESTS:  04/18/2022: 18" chair transfer independent without UE assist. 03/12/2022: 18 inch chair transfer:  requires UE assist on armrests.   GAIT: 04/18/2022: pt amb >200' with prosthesis only independently.   Pt neg ramp & curb with prosthesis only independently.    03/12/2022: Distance walked: 10' Assistive device utilized: left Transfemoral prosthesis & no AD Level of assistance: SBA Comments: right knee flexed in stance   TODAY'S TREATMENT                                                                           [facility] Adult PT Treatment: DATE:  04/18/2022: PT performed assessment with above objective data.  PT reviewed need for ongoing exercise program. Pt verbalized understanding.   04/16/2022: Therapeutic Exercise: Leg press 50# RLE with PT manual assist for knee ext 5 sec hold 15 reps 2 sets Standing gastroc stretrch forefoot against 4" step with forward lean 30 sec hold 2 reps Heel raise with LUE support //bar 15 reps Rocker board using knee ext to push back down 1 min.  Standing TKE red theraband with RW support 10 reps 3 sec hold Quad sets with knee ext varying degrees 5 sec hold 3 reps ea range for total 15 reps.   Manual therapy: Right knee has increased edema anterior at distal patella and increased tenderness. PT set up appt with Dondra Prader, NP with Dr. Sharol Given for tomorrow at [facility] to assess knee & write Rx for new prosthesis.   04/11/2022: Therapeutic Exercise: Standing gastroc stretrch forefoot against 4" step with forward lean 30 sec hold 2 reps Heel raise with LUE support //bar 15 reps Rocker board using knee ext to push back down 1 min.  Standing  TKE red theraband with RW support 10 reps 3 sec hold Quad sets with knee ext varying degrees 5 sec hold 3 reps ea range for total 15 reps. LAQ 3# 15 reps.  Leg press 50# RLE with PT manual assist for knee ext 5 sec hold 15 reps 1 set back at 45* & 2 sets with back flat Eccentric quad on stairs with 2 rails 5 reps.   Manual therapy: PROM with overpressure for ext.   Mob belt for ext in standing.  Muscle stretches for  hamstring & hip flexor manually.   PATIENT EDUCATION:  Education details: HEP, POC, reinforced AD use Person educated: Patient Education method: Explanation, Demonstration, Verbal cues, and Handouts Education comprehension: verbalized understanding, returned demonstration, and verbal cues required  HOME EXERCISE PROGRAM: Access Code: [email or web address] URL: [email or web address] Date: 03/12/2022 Prepared by: Jamey Reas  Exercises - Quad Setting and Stretching  - 2-4 x daily - 7 x weekly - 5-10 sets - 10 reps - prop 5-10 minutes & quad set5 seconds hold - Supine Leg Press  - 2-4 x daily - 7 x weekly - 5-10 sets - 10 reps - 5 seconds hold - Supine Heel Slide with Strap  - 2-3 x daily - 7 x weekly - 2-3 sets - 10 reps - 5 seconds hold - Supine Knee Extension Strengthening  - 2-3 x daily - 7 x weekly - 2-3 sets - 10 reps - 5 seconds hold - Seated Knee Flexion Extension AROM   - 2-4 x daily - 7 x weekly - 2-3 sets - 10 reps - 5 seconds hold - Seated Hamstring Stretch with Strap  - 2-4 x daily - 7 x weekly - 1 sets - 3 reps - 20-30 seconds hold  ASSESSMENT:  CLINICAL IMPRESSION: Pt met all LTGs except pain.  Patient reports high pain levels but mobility & non-verbal do not indicate pain as high.  Pt is pleased with current level of function. She reports her function has improved significantly following TKA & rehab.   OBJECTIVE IMPAIRMENTS: Abnormal gait, decreased balance, decreased mobility, difficulty walking, decreased ROM, decreased strength, increased edema, increased muscle spasms, and pain.   ACTIVITY LIMITATIONS: carrying, lifting, standing, sleeping, stairs, transfers, and locomotion level  PARTICIPATION LIMITATIONS: meal prep, community activity, and church  PERSONAL FACTORS: Fitness, Time since onset of injury/illness/exacerbation, and 3+ comorbidities: see PMH  are also affecting patient's functional outcome.   REHAB POTENTIAL: Good  CLINICAL DECISION MAKING:  Stable/uncomplicated  EVALUATION COMPLEXITY: Low   GOALS: Goals reviewed with patient? Yes  SHORT TERM GOALS: (target date for Short term goals are 3 weeks 03/30/2022)   1.  Patient will demonstrate independent use of home exercise program to maintain progress from in clinic treatments.  Goal status: MET 03/20/22  LONG TERM GOALS: (target dates for all long term goals are 6 weeks  04/20/2022 )   1. Patient will demonstrate/report pain at worst less than or equal to 2/10 to facilitate minimal limitation in daily activity secondary to pain symptoms.  Goal status: NOT MET 04/18/2022  however patient's non-verbal does not indicate pain as high as reported.    2. Patient will demonstrate independent use of home exercise program to facilitate ability to maintain/progress functional gains from skilled physical therapy services.  Goal status: MET 1/17/202   3. Patient will demonstrate FOTO outcome > or = 62 % to indicate reduced disability due to condition.  Goal status: MET 1/17/202 4.  Right knee extension standing -5* AROM.  Goal status: MET 1/17/202   5.  Patient ambulates >200' with prosthesis & LRAD modified independent. Goal status: MET 1/17/202   6.  pt negotiates ramps & curbs with prosthesis & LRAD modified independent. Goal status: MET 1/17/202     PLAN:  PT FREQUENCY: 2x/week  PT DURATION: 6 weeks  PLANNED INTERVENTIONS: Therapeutic exercises, Therapeutic activity, Neuro Muscular re-education, Balance training, Gait training, Patient/Family education, Joint mobilization, Stair training, DME instructions, Dry Needling, Electrical stimulation, Traction, Cryotherapy, vasopneumatic deviceMoist heat, Taping, Ultrasound, Ionotophoresis '4mg'$ /ml Dexamethasone, and Manual therapy.  All included unless contraindicated  PLAN FOR NEXT SESSION: discharge PT.     Jamey Reas, PT, DPT 04/18/2022, 3:02 PM

## 2022-04-26 ENCOUNTER — Encounter: Payer: Self-pay | Admitting: Gastroenterology

## 2022-05-07 ENCOUNTER — Telehealth: Payer: Self-pay

## 2022-05-07 ENCOUNTER — Other Ambulatory Visit (INDEPENDENT_AMBULATORY_CARE_PROVIDER_SITE_OTHER): Payer: Self-pay

## 2022-05-07 DIAGNOSIS — K862 Cyst of pancreas: Secondary | ICD-10-CM | POA: Diagnosis not present

## 2022-05-07 DIAGNOSIS — R5383 Other fatigue: Secondary | ICD-10-CM

## 2022-05-07 DIAGNOSIS — K7460 Unspecified cirrhosis of liver: Secondary | ICD-10-CM

## 2022-05-07 LAB — CBC WITH DIFFERENTIAL/PLATELET
Basophils Absolute: 0 10*3/uL (ref 0.0–0.1)
Basophils Relative: 0.4 % (ref 0.0–3.0)
Eosinophils Absolute: 0.1 10*3/uL (ref 0.0–0.7)
Eosinophils Relative: 2.5 % (ref 0.0–5.0)
HCT: 40.3 % (ref 36.0–46.0)
Hemoglobin: 13.7 g/dL (ref 12.0–15.0)
Lymphocytes Relative: 48.2 % — ABNORMAL HIGH (ref 12.0–46.0)
Lymphs Abs: 2.1 10*3/uL (ref 0.7–4.0)
MCHC: 34.1 g/dL (ref 30.0–36.0)
MCV: 86 fl (ref 78.0–100.0)
Monocytes Absolute: 0.3 10*3/uL (ref 0.1–1.0)
Monocytes Relative: 7.5 % (ref 3.0–12.0)
Neutro Abs: 1.8 10*3/uL (ref 1.4–7.7)
Neutrophils Relative %: 41.4 % — ABNORMAL LOW (ref 43.0–77.0)
Platelets: 239 10*3/uL (ref 150.0–400.0)
RBC: 4.69 Mil/uL (ref 3.87–5.11)
RDW: 13.9 % (ref 11.5–15.5)
WBC: 4.4 10*3/uL (ref 4.0–10.5)

## 2022-05-07 LAB — COMPREHENSIVE METABOLIC PANEL
ALT: 16 U/L (ref 0–35)
AST: 24 U/L (ref 0–37)
Albumin: 4.4 g/dL (ref 3.5–5.2)
Alkaline Phosphatase: 115 U/L (ref 39–117)
BUN: 15 mg/dL (ref 6–23)
CO2: 26 mEq/L (ref 19–32)
Calcium: 9.7 mg/dL (ref 8.4–10.5)
Chloride: 104 mEq/L (ref 96–112)
Creatinine, Ser: 0.97 mg/dL (ref 0.40–1.20)
GFR: 62.19 mL/min (ref >60.00)
Glucose, Bld: 120 mg/dL — ABNORMAL HIGH (ref 70–99)
Potassium: 3.2 mEq/L — ABNORMAL LOW (ref 3.5–5.1)
Sodium: 140 mEq/L (ref 135–145)
Total Bilirubin: 0.7 mg/dL (ref 0.2–1.2)
Total Protein: 7.5 g/dL (ref 6.0–8.3)

## 2022-05-07 LAB — PROTIME-INR
INR: 1.1 ratio — ABNORMAL HIGH (ref 0.8–1.0)
Prothrombin Time: 12.4 s (ref 9.6–13.1)

## 2022-05-07 NOTE — Telephone Encounter (Signed)
-----   Message from Roetta Sessions, [place] sent at 11/13/2021  8:36 AM EDT ----- Regarding: due for labs  CBC, CMET, INR, AFP in 6 months (Feb 2024)

## 2022-05-07 NOTE — Telephone Encounter (Signed)
Orders are in. Called patient. She will go to the lab this week

## 2022-05-08 ENCOUNTER — Telehealth: Payer: Self-pay | Admitting: Gastroenterology

## 2022-05-08 NOTE — Telephone Encounter (Signed)
Dr. Havery Moros has not reviewed labs yet, we are still waiting on 1 lab result to return. I called patient and informed her that we will be in touch once results have returned. Pt is very anxious about results, I reviewed the results and told pt that I did not see anything alarming but Dr. Havery Moros will review once all results are in. Pt verbalized understanding and had no concerns at the end of the call.

## 2022-05-08 NOTE — Telephone Encounter (Signed)
Patient is calling wishing to speak with someone regarding test results, states she doesn't understand. Please advise

## 2022-05-09 LAB — AFP TUMOR MARKER: AFP-Tumor Marker: 5.8 ng/mL

## 2022-05-10 ENCOUNTER — Encounter: Payer: Self-pay | Admitting: Orthopedic Surgery

## 2022-05-10 ENCOUNTER — Ambulatory Visit (INDEPENDENT_AMBULATORY_CARE_PROVIDER_SITE_OTHER): Payer: Self-pay

## 2022-05-10 ENCOUNTER — Ambulatory Visit (INDEPENDENT_AMBULATORY_CARE_PROVIDER_SITE_OTHER): Payer: Self-pay | Admitting: Orthopedic Surgery

## 2022-05-10 DIAGNOSIS — Z96651 Presence of right artificial knee joint: Secondary | ICD-10-CM

## 2022-05-10 DIAGNOSIS — Z89612 Acquired absence of left leg above knee: Secondary | ICD-10-CM

## 2022-05-10 NOTE — Progress Notes (Addendum)
Office Visit Note   Patient: Stacy Thomas           Date of Birth: June 08, 1960           MRN: [id number] Visit Date: 05/10/2022              Requested by: Sanjuan Dame, MD [address] [place],  [place] [id number] PCP: Sanjuan Dame, MD  Chief Complaint  Patient presents with   Right Knee - Pain      HPI: Patient is a 62 year old woman who recently had a fall landing on her right knee status post right total knee arthroplasty November 2023 patient has a poorly fitting left above-the-knee prosthesis.  Patient's current prosthesis is over 20 years old.  Her current socket knee and foot are well-worn in need of full replacement.  Patient's current socket does not fit secondary to loss of residual volume.  Patient cannot maintain suction with the current socket.  Assessment & Plan: Visit Diagnoses:  1. Status post total right knee replacement   2. S/P AKA (above knee amputation), left ([place])     Plan: Patient was provided prescription for new K3 prosthesis left above-knee amputation.  Patient will need all components replaced.  Patient is highly motivated K3 prosthetic user and for patient to continue at this level of activity she will need the new complete prosthesis which is medically necessary at this time.  Follow-Up Instructions: No follow-ups on file.   Ortho Exam  Patient is alert, oriented, no adenopathy, well-dressed, normal affect, normal respiratory effort. Patients  right knee has full range of motion there is no effusion no bruising no ecchymosis varus and valgus stress is stable.  The patella tracks midline.  Patient is an existing left transfemoral amputee.  Patient's current comorbidities are not expected to impact the ability to function with the prescribed prosthesis. Patient verbally communicates a strong desire to use a prosthesis. Patient currently requires mobility aids to ambulate without a prosthesis.  Expects not to use mobility aids with a new  prosthesis.  Patient is a K3 level ambulator that spends a lot of time walking around on uneven terrain over obstacles, up and down stairs, and ambulates with a variable cadence.          Imaging: XR Knee 1-2 Views Right  Result Date: 05/10/2022 2 view radiographs of the right knee shows a stable total knee arthroplasty press-fit with no varus or valgus malalignment.  The patella is midline.  No images are attached to the encounter.  Labs: Lab Results  Component Value Date   HGBA1C 5.8 (H) 12/05/2021   HGBA1C 5.9 (H) 02/20/2021   HGBA1C 5.9 (H) 02/29/2020     Lab Results  Component Value Date   ALBUMIN 4.4 05/07/2022   ALBUMIN 3.8 01/29/2022   ALBUMIN 4.6 12/05/2021    No results found for: "MG" Lab Results  Component Value Date   VD25OH 15.8 (L) 05/03/2016    No results found for: "PREALBUMIN"    Latest Ref Rng & Units 05/07/2022    1:16 PM 01/29/2022    8:47 AM 12/05/2021    9:56 AM  CBC EXTENDED  WBC 4.0 - 10.5 K/uL 4.4  4.1  4.2   RBC 3.87 - 5.11 Mil/uL 4.69  4.56  4.53   Hemoglobin 12.0 - 15.0 g/dL 13.7  13.7  13.5   HCT 36.0 - 46.0 % 40.3  40.8  39.8   Platelets 150.0 - 400.0 K/uL 239.0  215  210   NEUT# 1.4 - 7.7 K/uL 1.8   1.8   Lymph# 0.7 - 4.0 K/uL 2.1   1.9      There is no height or weight on file to calculate BMI.  Orders:  Orders Placed This Encounter  Procedures   XR Knee 1-2 Views Right   No orders of the defined types were placed in this encounter.    Procedures: No procedures performed  Clinical Data: No additional findings.  ROS:  All other systems negative, except as noted in the HPI. Review of Systems  Objective: Vital Signs: LMP 12/02/2010   Specialty Comments:  No specialty comments available.  PMFS History: Patient Active Problem List   Diagnosis Date Noted   Total knee replacement status, right 02/07/2022   Malaise and fatigue 12/05/2021   Prediabetes 02/23/2021   Hair loss 02/23/2021   Memory changes  03/01/2020   CKD (chronic kidney disease) 09/11/2019   HLD (hyperlipidemia) 09/09/2018   Allergic sinusitis 12/03/2017   Hx of AKA (above knee amputation), left ([place]) 02/18/2017   Unilateral primary osteoarthritis, right knee 02/18/2017   Vitamin D deficiency 05/04/2016   Bilateral primary osteoarthritis of hip 04/26/2016   Bilateral carpal tunnel syndrome 03/29/2016   Screening for cervical cancer 03/06/2016   PVD (peripheral vascular disease) ([place]) 03/05/2016   Pancreatic abnormality 03/05/2016   Hypertension 03/05/2016   Hepatic cirrhosis ([place]) 11/15/2015   Chronic hepatitis C ([place]) 06/15/2015   Prolapsed internal hemorrhoids, grade 4 06/14/2015   Hx of osteosarcoma 06/14/2015   Past Medical History:  Diagnosis Date   Bilateral arm pain    chronic, due to use of crutches   Dependence on crutches    left leg prosthesis   Drug addiction in remission ([place])    since 2007  (crack cocaine,  IV drug use)   Hand weakness    Hepatic cirrhosis ([place]) 2018   followed by dr Havery Moros (gi) due to hx chronic hepatitis c   Hiatal hernia    History of esophageal stricture    post dilation x2   History of hepatitis C    pt completed harvoni treatment 2017, cured;  genotype 1a with fibrosis 3/4,  previously seen by dr comer   History of osteosarcoma    1982  left knee---  s/p  AKA LLE--   per pt no recurrence   History of recreational drug use multiple rehab visits   IV drug use for 1 yr and Smoked crack for 30+yrs--  per pt clean since 2007   Hyperlipidemia    Hypertension    Idiopathic peripheral neuropathy    OSA (obstructive sleep apnea)    study in epic 10-27-2018 by dr Rexene Alberts,  moderate osa,  pt stated used cpap until approx. 07/ 2021, intolerate of mask   Osteoarthritis    Pancreatic cyst    followed by dr Havery Moros--- per office note bening and stable   Sigmoid diverticulosis    mild   Wears glasses     Family History  Problem Relation Age of Onset   COPD Father     Alcohol abuse Father        Deceased   Hypertension Mother        Living   Healthy Daughter    Healthy Son    Anesthesia problems Neg Hx    Breast cancer Neg Hx    Colon cancer Neg Hx    Esophageal cancer Neg Hx    Rectal cancer Neg Hx  Stomach cancer Neg Hx    Colon polyps Neg Hx     Past Surgical History:  Procedure Laterality Date   ABOVE KNEE LEG AMPUTATION Left 1982   osteosarcoma   BREAST BIOPSY Left 01/2021   x2   CARPAL TUNNEL RELEASE Right 10/18/2021   Procedure: RIGHT CARPAL TUNNEL RELEASE;  Surgeon: Leandrew Koyanagi, MD;  Location: [place];  Service: Orthopedics;  Laterality: Right;   COLONOSCOPY WITH PROPOFOL  last one 04-28-2020  dr Havery Moros   ESOPHAGOGASTRODUODENOSCOPY (EGD) WITH PROPOFOL  last one 11-02-2019  dr Havery Moros   EXCISION BREAST BX  2015   CYST   EXCISION OF SKIN TAG N/A 07/14/2020   Procedure: EXCISION OF ANAL PAPILLA;  Surgeon: Leighton Ruff, MD;  Location: [facility];  Service: General;  Laterality: N/A;   STERIOD INJECTION Left 10/18/2021   Procedure: LEFT CARPAL TUNNEL INJECTION;  Surgeon: Leandrew Koyanagi, MD;  Location: [place];  Service: Orthopedics;  Laterality: Left;   TONSILLECTOMY  as child   TOTAL KNEE ARTHROPLASTY Right 02/07/2022   Procedure: RIGHT TOTAL KNEE ARTHROPLASTY;  Surgeon: Newt Minion, MD;  Location: [place];  Service: Orthopedics;  Laterality: Right;   TRANSANAL HEMORRHOIDAL DEARTERIALIZATION N/A 10/27/2015   Procedure: TRANSANAL HEMORRHOIDAL DEARTERIALIZATION;  Surgeon: Michael Boston, MD;  Location: [place];  Service: General;  Laterality: N/A;   UPPER GASTROINTESTINAL ENDOSCOPY     Social History   Occupational History   Not on file  Tobacco Use   Smoking status: Former    Packs/day: 1.00    Years: 37.00    Total pack years: 37.00    Types: Cigarettes    Start date: 04/02/1973    Quit date: 12/19/2014    Years since quitting: 7.3   Smokeless  tobacco: Never  Vaping Use   Vaping Use: Never used  Substance and Sexual Activity   Alcohol use: Never    Alcohol/week: 0.0 standard drinks of alcohol   Drug use: Not Currently    Types: "Crack" cocaine    Comment: recovery addict since 2007   Sexual activity: Not on file

## 2022-05-16 NOTE — Progress Notes (Signed)
, °

## 2022-06-19 ENCOUNTER — Encounter: Payer: Self-pay | Admitting: Student

## 2022-07-19 ENCOUNTER — Ambulatory Visit (INDEPENDENT_AMBULATORY_CARE_PROVIDER_SITE_OTHER): Payer: BC Managed Care – PPO | Admitting: Gastroenterology

## 2022-07-19 ENCOUNTER — Encounter: Payer: Self-pay | Admitting: Gastroenterology

## 2022-07-19 VITALS — BP 110/72 | HR 80 | Ht 60.0 in | Wt 149.4 lb

## 2022-07-19 DIAGNOSIS — Z8619 Personal history of other infectious and parasitic diseases: Secondary | ICD-10-CM | POA: Diagnosis not present

## 2022-07-19 DIAGNOSIS — K7460 Unspecified cirrhosis of liver: Secondary | ICD-10-CM | POA: Diagnosis not present

## 2022-07-19 DIAGNOSIS — K862 Cyst of pancreas: Secondary | ICD-10-CM

## 2022-07-19 DIAGNOSIS — Z89612 Acquired absence of left leg above knee: Secondary | ICD-10-CM | POA: Diagnosis not present

## 2022-07-19 NOTE — Patient Instructions (Addendum)
If your blood pressure at your visit was 140/90 or greater, please contact your primary care physician to follow up on this.  _______________________________________________________  If you are age 64 or older, your body mass index should be between 23-30. Your Body mass index is 29.17 kg/m. If this is out of the aforementioned range listed, please consider follow up with your Primary Care Provider.  If you are age 57 or younger, your body mass index should be between 19-25. Your Body mass index is 29.17 kg/m. If this is out of the aformentioned range listed, please consider follow up with your Primary Care Provider.   ________________________________________________________  The [facility] providers would like to encourage you to use [facility] to communicate with providers for non-urgent requests or questions.  Due to long hold times on the telephone, sending your provider a message by [facility] may be a faster and more efficient way to get a response.  Please allow 48 business hours for a response.  Please remember that this is for non-urgent requests.  _______________________________________________________  Due to recent changes in healthcare laws, you may see the results of your imaging and laboratory studies on MyChart before your provider has had a chance to review them.  We understand that in some cases there may be results that are confusing or concerning to you. Not all laboratory results come back in the same time frame and the provider may be waiting for multiple results in order to interpret others.  Please give [place] 48 hours in order for your provider to thoroughly review all the results before contacting the office for clarification of your results.   You will be due for labs in August.  You will be due for liver ultrasound with Elastography in July.  You will be due for MRCP in 08-2023.  Please follow up in 1 year or sooner with any concerns.  Thank you for entrusting me with your  care and for choosing [facility], Dr. Ileene Patrick

## 2022-07-19 NOTE — Progress Notes (Signed)
HPI :  62 year old female here for follow-up for hep C related cirrhosis, pancreatic cyst.  I last saw her in February 2023.  Recall she has a history of hep C diagnosed years ago, thought to be due to IV drugs / smoked crack cocaine. Genotype 1a, treated with Harvoni for 12 weeks years ago with eradication.  [place] with elastography prior to therapy showing normal liver and spleen, with some F3/F4 fibrotic changes. Imaging since then has showed changes concerning for cirrhosis.   She has been doing well since have last seen her.  She has compensated liver disease -no history of jaundice, no ascites, no hepatic encephalopathy.  Her last EGD was done in August 2021, no varices at that time.  She does not drink any alcohol.  Her HCC screening has been up-to-date and in fact her last ultrasound showed a normal liver.  Her platelet count has been notably normal.  Recall she has also been followed for benign-appearing pancreatic cyst, had a follow-up MRCP in May 2023 which showed stable changes in her pancreas, stable appearing cyst as outlined below.  No weight loss.  Radiology was rec ending repeat exam in 2 years for continued surveillance.   Recall she had a history of a subepithelial colon nodule in her right colon that has been monitored over time with surveillance colonoscopy.  Her last exam was in January 2022 which showed no interval enlargement.  She has had a history of colon polyps associated with this.  She has had a prior CT scan of her abdomen which showed no obvious subepithelial lesion.  She denies any problems with her bowels.      Prior workup: EGD 09/10/2016 - 3cm HH, irregular zline, no varices, H pylori gastritis, given Pylera for gastritis -    MRCP 02/11/2017 - changes of cirrhosis noted, stable 1.5cm cystic pancreatic lesion uncinate process without high risk features, subcm LI-RADS category 3 lesion in the left lobe - recommended repeat MRI in 6 months   MRI 08/23/2017 - small  left lobe lesion less conspicous, stable cystic lesion in the uncinate process - repeat MRI in 1 year   RUQ [place] - 02/25/2018 - 6mm left lobe lesion stable, suspected hemangioma   MRCP 08/22/18 - stable cystic lesion of the pancreas - measuring 11 x 7mm, no change from 02/06/2016 - radiology recommending surveillance in 1 year, mild changes of cirrhosis   [place] 06/27/16 - "mild cirrhosis", no mass lesions   CT abdomen 02/06/2016 - subtle changes of cirrhosis, indeterminate lesion in pancreas 1.5cm thought to represent cyst.    Colonoscopy 03/23/19 - The perianal and digital rectal examinations were normal. - A diminutive polyp was found in the distal ascending colon. The polyp was sessile. The polyp was removed with a cold biopsy forceps. Resection and retrieval were complete. - Two sessile polyps were found in the distal ascending colon. The polyps were 4 to 6 mm in size. These polyps were removed with a cold snare. Resection and retrieval were complete. - One roughly 5 mm subepithelial nodule was found in the distal ascending colon. It was hard to palpation with the forceps. Multiple bite on biopsies were taken with a cold forceps for histology. Area just distal to the lesion was tattooed with an injection of Spot (carbon black). - A diminutive polyp was found in the transverse colon. The polyp was sessile. The polyp was removed with a cold snare. Resection and retrieval were complete. - Internal hemorrhoids were found during retroflexion. -  There was evidence of scarring in the rectum from prior hemorrhoidectomy. The colon was tortous. The exam was otherwise without abnormality.   1. Surgical [P], colon, ascending, polyp (3) - TUBULAR ADENOMA(S). - NO HIGH GRADE DYSPLASIA OR MALIGNANCY. 2. Surgical [P], colon, ascending BX - BENIGN COLONIC MUCOSA. - NO INFLAMMATORY CHANGES, ADENOMATOUS CHANGE OR MALIGNANCY.   CT scan 04/10/19 - IMPRESSION: 1. No CT abnormality of the colon. 2. Hepatic  steatosis. 3. Aortic Atherosclerosis (ICD10-I70.0). Focal ectasia of the terminal aorta measuring up to 2.1 cm, unchanged compared to prior examination. 4. Coronary artery disease.   MRCP - 08/17/19 - IMPRESSION: 1. Minimal motion degradation. 2. Similar 1.2 cm cystic lesion within the pancreatic uncinate process. Most likely a pseudocyst. Per consensus criteria, this warrants surveillance by annual MRI/MRCP for a total of 5 years. This recommendation follows ACR consensus guidelines: Management of Incidental Pancreatic Cysts: A White Paper of the ACR Incidental Findings Committee. J Am Coll Radiol 2017;14:911-923. 3. Mild cirrhosis. 4.  Aortic Atherosclerosis (ICD10-I70.0).     EGD 11/02/19: - A 2 cm hiatal hernia was present. - The Z-line was irregular with a few diminutive islands of salmon colored mucosa, did not meet criteria for Barrett's biopsies.. - A single small area of benign ectopic gastric mucosa was found in the upper third of the esophagus. - The exam of the esophagus was otherwise normal. No esophageal varices. - The entire examined stomach was normal. No gastric varices - The duodenal bulb and second portion of the duodenum were normal.     Colonoscopy 04/28/20:The perianal and digital rectal examinations were normal. - One 5 to 6 mm subepithelial nodule was found in the distal ascending colon, proximal to area of prior tattoo. Similar in appearance compared to one year ago. Bite on bite biopsies were taken with a cold forceps for histology. - A few small-mouthed diverticula were found in the sigmoid colon. - A 4 mm polyp was found in the recto-sigmoid colon. The polyp was sessile. The polyp was removed with a cold snare. Resection and retrieval were complete. - Anal papilla(e) was hypertrophied vs. prolapsed hemorrhoid, with some inflammatory changes noted at the base of it. Biopsies were taken with a cold forceps for histology. - Internal hemorrhoids were found  during retroflexion. - There was scarring in the rectum, similar in appearance compared to prior exam. Colon was tortous. The exam was otherwise without abnormality.   1. Surgical [P], ascending colon nodule bx's - BENIGN COLONIC MUCOSA - NO ACTIVE INFLAMMATION OR EVIDENCE OF MICROSCOPIC COLITIS - NO HIGH GRADE DYSPLASIA OR MALIGNANCY IDENTIFIED 2. Surgical [P], colon, rectosigmoid, polyp - HYPERPLASTIC POLYP (1 OF 1 FRAGMENTS) - NO HIGH GRADE DYSPLASIA OR MALIGNANCY IDENTIFIED - SEE COMMENT 3. Surgical [P], colon, anal bx's - LOW GRADE SQUAMOUS INTRAEPITHELIAL LESION (AIN 1; LGSIL) - SEE COMMENT  2. Additional levels were obtained to assess for adenomatous features but none were not identified. 3. P16 immunohistochemistry supports the diagnosis of low grade dysplasia. Dr. Rayetta Pigg reviewed the case and agrees with the above diagnosis.   Referred to [facility]   MRCP 08/22/20: IMPRESSION: 1. Stable appearance of 1.2 cm cystic lesion within the uncinate process of the pancreas. This is favored to represent a benign process. Recommend follow-up imaging in 12 months with repeat MRI/MRCP. Per consensus criteria, this warrants surveillance by annual MRI/MRCP for a total of 5 years. This recommendation follows ACR consensus guidelines: Management of incidental Pancreatic Cysts: A White Paper of the ACR Incidental findings Committee. J  Am Coll Radiol 2017;14:911-923. 2. Mild cirrhosis. No suspicious liver lesions.     MRCP 08/01/2021: IMPRESSION: 1. 5.5 years of stability of the 1.2 cm cystic lesion in the uncinate process of the pancreas with questionable thin internal septation. Possibilities include a postinflammatory cystic lesion, small serous cystadenoma, or small intraductal papillary mucinous neoplasm. Taking into account patient age in lesion size follow up pancreatic protocol MRI is recommended in 2 years time. This recommendation follows ACR consensus guidelines: Management  of Incidental Pancreatic Cysts: A White Paper of the ACR Incidental Findings Committee. J Am Coll Radiol 2017;14:911-923. 2.  Aortic Atherosclerosis (ICD10-I70.0). 3. Probably early cirrhosis morphology, with some faint foci of arterial phase hyperintensity most notable in the right hepatic lobe measuring up to 1 cm in diameter. This lesion is technically LI-RADS category LR 3 although the patient has had similar small waxing and waning small foci of arterial phase enhancement on prior exams. Taking into account this imaging history , surveillance MRI at no greater than 2 years time is suggested.   RUQ [place] 04/12/2022: IMPRESSION: Normal study. No evidence of cirrhosis or hepatic mass. No cause for the patient's symptoms identified.    Past Medical History:  Diagnosis Date   Bilateral arm pain    chronic, due to use of crutches   Dependence on crutches    left leg prosthesis   Drug addiction in remission    since 2007  (crack cocaine,  IV drug use)   Hand weakness    Hepatic cirrhosis 2018   followed by dr Adela Lank (gi) due to hx chronic hepatitis c   Hiatal hernia    History of esophageal stricture    post dilation x2   History of hepatitis C    pt completed harvoni treatment 2017, cured;  genotype 1a with fibrosis 3/4,  previously seen by dr comer   History of osteosarcoma    1982  left knee---  s/p  AKA LLE--   per pt no recurrence   History of recreational drug use multiple rehab visits   IV drug use for 1 yr and Smoked crack for 30+yrs--  per pt clean since 2007   Hyperlipidemia    Hypertension    Idiopathic peripheral neuropathy    OSA (obstructive sleep apnea)    study in epic 10-27-2018 by dr Frances Furbish,  moderate osa,  pt stated used cpap until approx. 07/ 2021, intolerate of mask   Osteoarthritis    Pancreatic cyst    followed by dr Adela Lank--- per office note bening and stable   Sigmoid diverticulosis    mild   Wears glasses      Past Surgical History:   Procedure Laterality Date   ABOVE KNEE LEG AMPUTATION Left 1982   osteosarcoma   BREAST BIOPSY Left 01/2021   x2   CARPAL TUNNEL RELEASE Right 10/18/2021   Procedure: RIGHT CARPAL TUNNEL RELEASE;  Surgeon: Tarry Kos, MD;  Location: [facility];  Service: Orthopedics;  Laterality: Right;   COLONOSCOPY WITH PROPOFOL  last one 04-28-2020  dr Adela Lank   ESOPHAGOGASTRODUODENOSCOPY (EGD) WITH PROPOFOL  last one 11-02-2019  dr Adela Lank   EXCISION BREAST BX  2015   CYST   EXCISION OF SKIN TAG N/A 07/14/2020   Procedure: EXCISION OF ANAL PAPILLA;  Surgeon: Romie Levee, MD;  Location: [facility];  Service: General;  Laterality: N/A;   STERIOD INJECTION Left 10/18/2021   Procedure: LEFT CARPAL TUNNEL INJECTION;  Surgeon: Tarry Kos, MD;  Location: [facility];  Service: Orthopedics;  Laterality: Left;   TONSILLECTOMY  as child   TOTAL KNEE ARTHROPLASTY Right 02/07/2022   Procedure: RIGHT TOTAL KNEE ARTHROPLASTY;  Surgeon: Nadara Mustard, MD;  Location[facility];  Service: Orthopedics;  Laterality: Right;   TRANSANAL HEMORRHOIDAL DEARTERIALIZATION N/A 10/27/2015   Procedure: TRANSANAL HEMORRHOIDAL DEARTERIALIZATION;  Surgeon: Karie Soda, MD;  Location: [facility];  Service: General;  Laterality: N/A;   UPPER GASTROINTESTINAL ENDOSCOPY     Family History  Problem Relation Age of Onset   COPD Father    Alcohol abuse Father        Deceased   Hypertension Mother        Living   Healthy Daughter    Healthy Son    Anesthesia problems Neg Hx    Breast cancer Neg Hx    Colon cancer Neg Hx    Esophageal cancer Neg Hx    Rectal cancer Neg Hx    Stomach cancer Neg Hx    Colon polyps Neg Hx    Social History   Tobacco Use   Smoking status: Former    Packs/day: 1.00    Years: 37.00    Additional pack years: 0.00    Total pack years: 37.00    Types: Cigarettes    Start date: 04/02/1973    Quit date: 12/19/2014    Years  since quitting: 7.5   Smokeless tobacco: Never  Vaping Use   Vaping Use: Never used  Substance Use Topics   Alcohol use: Never    Alcohol/week: 0.0 standard drinks of alcohol   Drug use: Not Currently    Types: "Crack" cocaine    Comment: recovery addict since 2007   Current Outpatient Medications  Medication Sig Dispense Refill   atorvastatin (LIPITOR) 40 MG tablet Take 1 tablet (40 mg total) by mouth daily. 90 tablet 3   lisinopril (ZESTRIL) 20 MG tablet Take 1 tablet (20 mg total) by mouth daily. 90 tablet 0   lisinopril-hydrochlorothiazide (ZESTORETIC) 20-25 MG tablet Take 1 tablet by mouth daily.     Apoaequorin (PREVAGEN) 10 MG CAPS Take 10 mg by mouth daily. (Patient not taking: Reported on 07/19/2022)     No current facility-administered medications for this visit.   Allergies  Allergen Reactions   Morphine Swelling    REACTION: face swells   Codeine Other (See Comments)    Avoids due to being recovery addict     Review of Systems: All systems reviewed and negative except where noted in HPI.   Lab Results  Component Value Date   WBC 4.4 05/07/2022   HGB 13.7 05/07/2022   HCT 40.3 05/07/2022   MCV 86.0 05/07/2022   PLT 239.0 05/07/2022    Lab Results  Component Value Date   INR 1.1 (H) 05/07/2022   INR 1.1 12/05/2021   INR 1.1 (H) 11/10/2021    Lab Results  Component Value Date   CREATININE 0.97 05/07/2022   BUN 15 05/07/2022   NA 140 05/07/2022   K 3.2 (L) 05/07/2022   CL 104 05/07/2022   CO2 26 05/07/2022    Lab Results  Component Value Date   ALT 16 05/07/2022   AST 24 05/07/2022   ALKPHOS 115 05/07/2022   BILITOT 0.7 05/07/2022   MELD 3.0: 8 at 05/07/2022  1:16 PM MELD-Na: 7 at 05/07/2022  1:16 PM Calculated from: Serum Creatinine: 0.97 mg/dL (Using min of 1 mg/dL) at 04/07/1094  0:45 PM Serum  Sodium: 140 mEq/L (Using max of 137 mEq/L) at 05/07/2022  1:16 PM Total Bilirubin: 0.7 mg/dL (Using min of 1 mg/dL) at 4/0/9811  9:14 PM Serum Albumin:  4.4 g/dL (Using max of 3.5 g/dL) at 10/08/2954  2:13 PM INR(ratio): 1.1 ratio at 05/07/2022  1:16 PM Age at listing (hypothetical): 61 years Sex: Female at 05/07/2022  1:16 PM    Physical Exam: BP 110/72 (BP Location: Left Arm, Patient Position: Sitting, Cuff Size: Normal)   Pulse 80   Ht 5' (1.524 m)   Wt 149 lb 6 oz (67.8 kg)   LMP 12/02/2010   BMI 29.17 kg/m  Constitutional: Pleasant,well-developed, female in no acute distress. Neurological: Alert and oriented to person place and time  ASSESSMENT: 62 y.o. female here for assessment of the following  1. Cirrhosis of liver without ascites, unspecified hepatic cirrhosis type   2. History of hepatitis C   3. Pancreatic cyst    Compensated cirrhosis status post hep C eradication.  HCC screening up-to-date.  Labs as above, MELD of 8.  She is doing really well.  We discussed long-term risks of decompensation and HCC.  Given her platelet count is normal, and her last ultrasound looked normal, for her next right upper quadrant ultrasound for HCC screening will add on an elastography.  If this looks good and kPA < 20, and platelets remain greater than 150, we may not need to perform further EGD screening for varices.  She was happy to hear this.  Will continue to see her at least once yearly and do labs and [facility] screening every 6 months.  Otherwise reassured her of recent MRCP, repeat MRCP in May 2025  PLAN: - RUQ [place] with elastography in July - labs in August - CBC, CMET, INR, AFP - holding off on EGD right now, awaiting elastography - if kPA < 20 and platelets > 150, does not need further EGDs for varices screening - MRCP 08/2023 - follow up in one year or sooner with issues  Harlin Rain, MD [facility]

## 2022-09-26 ENCOUNTER — Other Ambulatory Visit: Payer: Self-pay

## 2022-09-26 DIAGNOSIS — Z8619 Personal history of other infectious and parasitic diseases: Secondary | ICD-10-CM

## 2022-09-26 DIAGNOSIS — K7460 Unspecified cirrhosis of liver: Secondary | ICD-10-CM

## 2022-10-09 ENCOUNTER — Ambulatory Visit (INDEPENDENT_AMBULATORY_CARE_PROVIDER_SITE_OTHER): Payer: BC Managed Care – PPO | Admitting: Internal Medicine

## 2022-10-09 ENCOUNTER — Encounter: Payer: Self-pay | Admitting: Student

## 2022-10-09 ENCOUNTER — Other Ambulatory Visit: Payer: Self-pay

## 2022-10-09 VITALS — BP 154/101 | HR 88 | Temp 98.2°F | Ht 62.0 in | Wt 157.1 lb

## 2022-10-09 DIAGNOSIS — R413 Other amnesia: Secondary | ICD-10-CM

## 2022-10-09 DIAGNOSIS — M65341 Trigger finger, right ring finger: Secondary | ICD-10-CM

## 2022-10-09 DIAGNOSIS — M25512 Pain in left shoulder: Secondary | ICD-10-CM

## 2022-10-09 DIAGNOSIS — I10 Essential (primary) hypertension: Secondary | ICD-10-CM | POA: Diagnosis not present

## 2022-10-09 DIAGNOSIS — Z87891 Personal history of nicotine dependence: Secondary | ICD-10-CM

## 2022-10-09 MED ORDER — LISINOPRIL 20 MG PO TABS
20.0000 mg | ORAL_TABLET | Freq: Every day | ORAL | 3 refills | Status: DC
Start: 2022-10-09 — End: 2023-01-22

## 2022-10-09 NOTE — Patient Instructions (Addendum)
Thank you, [name] for allowing [place] to provide your care today.   Blood pressure I sent in refill for lisinopril. Please follow-up in 4 weeks  Back pain/ left shoulder pain For now I have printed some exercise for you to try. Please come back in 4 weeks. I do think that having to transfer with crutches is contributing to your pain. You would likely benefit from occupational therapy. Please let me know if you are interested in this referral.  Trigger finger I am referring you to orthopedics for a steroid injection of your finger.   Memory problems Please come back in a few weeks for a focused visit on the memory changes you have noted. I want to make sure we have enough time to talk about this problem.  I have ordered the following medication/changed the following medications:   Stop the following medications: Medications Discontinued During This Encounter  Medication Reason   lisinopril-hydrochlorothiazide (ZESTORETIC) 20-25 MG tablet    lisinopril (ZESTRIL) 20 MG tablet Reorder     Start the following medications: Meds ordered this encounter  Medications   lisinopril (ZESTRIL) 20 MG tablet    Sig: Take 1 tablet (20 mg total) by mouth daily.    Dispense:  30 tablet    Refill:  3     Follow up:  4 weeks  We look forward to seeing you next time. Please call our clinic at [phone] if you have any questions or concerns. The best time to call is Monday-Friday from 9am-4pm, but there is someone available 24/7. If after hours or the weekend, call the main hospital number and ask for the Internal Medicine Resident On-Call. If you need medication refills, please notify your pharmacy one week in advance and they will send [place] a request.   Thank you for trusting me with your care. Wishing you the best!   Rudene Christians, DO [facility]

## 2022-10-09 NOTE — Progress Notes (Unsigned)
Subjective:  CC: out of medications  HPI:  Stacy Thomas is a 62 y.o. female with a past medical history stated below and presents today for follow-up on blood pressure as she ran out of medications a few days ago. Please see problem based assessment and plan for additional details.  Past Medical History:  Diagnosis Date   Bilateral arm pain    chronic, due to use of crutches   Dependence on crutches    left leg prosthesis   Drug addiction in remission (HCC)    since 2007  (crack cocaine,  IV drug use)   Hand weakness    Hepatic cirrhosis (HCC) 2018   followed by dr Adela Lank (gi) due to hx chronic hepatitis c   Hiatal hernia    History of esophageal stricture    post dilation x2   History of hepatitis C    pt completed harvoni treatment 2017, cured;  genotype 1a with fibrosis 3/4,  previously seen by dr comer   History of osteosarcoma    1982  left knee---  s/p  AKA LLE--   per pt no recurrence   History of recreational drug use multiple rehab visits   IV drug use for 1 yr and Smoked crack for 30+yrs--  per pt clean since 2007   Hyperlipidemia    Hypertension    Idiopathic peripheral neuropathy    OSA (obstructive sleep apnea)    study in epic 10-27-2018 by dr Frances Furbish,  moderate osa,  pt stated used cpap until approx. 07/ 2021, intolerate of mask   Osteoarthritis    Pancreatic cyst    followed by dr Adela Lank--- per office note bening and stable   Sigmoid diverticulosis    mild   Wears glasses     Current Outpatient Medications on File Prior to Visit  Medication Sig Dispense Refill   Apoaequorin (PREVAGEN) 10 MG CAPS Take 10 mg by mouth daily. (Patient not taking: Reported on 07/19/2022)     atorvastatin (LIPITOR) 40 MG tablet Take 1 tablet (40 mg total) by mouth daily. 90 tablet 3   No current facility-administered medications on file prior to visit.    Family History  Problem Relation Age of Onset   COPD Father    Alcohol abuse Father        Deceased    Hypertension Mother        Living   Healthy Daughter    Healthy Son    Anesthesia problems Neg Hx    Breast cancer Neg Hx    Colon cancer Neg Hx    Esophageal cancer Neg Hx    Rectal cancer Neg Hx    Stomach cancer Neg Hx    Colon polyps Neg Hx     Social History   Socioeconomic History   Marital status: Married    Spouse name: Not on file   Number of children: Not on file   Years of education: Not on file   Highest education level: Not on file  Occupational History   Not on file  Tobacco Use   Smoking status: Former    Packs/day: 1.00    Years: 37.00    Additional pack years: 0.00    Total pack years: 37.00    Types: Cigarettes    Start date: 04/02/1973    Quit date: 12/19/2014    Years since quitting: 7.8   Smokeless tobacco: Never  Vaping Use   Vaping Use: Never used  Substance and Sexual  Activity   Alcohol use: Never    Alcohol/week: 0.0 standard drinks of alcohol   Drug use: Not Currently    Types: "Crack" cocaine    Comment: recovery addict since 2007   Sexual activity: Not on file  Other Topics Concern   Not on file  Social History Narrative   Lives with husband in a one story home.  Has 3 children.  Does not work.    Education: some college.   Social Determinants of Health   Financial Resource Strain: Not on file  Food Insecurity: No Food Insecurity (02/08/2022)   Hunger Vital Sign    Worried About Running Out of Food in the Last Year: Never true    Ran Out of Food in the Last Year: Never true  Transportation Needs: No Transportation Needs (02/08/2022)   PRAPARE - Administrator, Civil Service (Medical): No    Lack of Transportation (Non-Medical): No  Physical Activity: Not on file  Stress: Not on file  Social Connections: Not on file  Intimate Partner Violence: Not At Risk (02/08/2022)   Humiliation, Afraid, Rape, and Kick questionnaire    Fear of Current or Ex-Partner: No    Emotionally Abused: No    Physically Abused: No     Sexually Abused: No    Review of Systems: ROS negative except for what is noted on the assessment and plan.  Objective:   Vitals:   10/09/22 1611  BP: (!) 154/101  Pulse: 88  Temp: 98.2 F (36.8 C)  TempSrc: Oral  SpO2: 100%  Weight: 157 lb 1.6 oz (71.3 kg)  Height: 5\' 2"  (1.575 m)    Physical Exam: Constitutional: well-appearing  Cardiovascular: regular rate and rhythm, no m/r/g Pulmonary/Chest: normal work of breathing on room air, lungs clear to auscultation bilaterally Abdominal: soft, non-tender, non-distended MSK: no swelling or erythema of right fourth finger, TTP to palmar surface of finger, pain with flexion and limits motion Left shoulder with no edema or erythema, no TTP to [facility] joint line, limited range of motion in abduction and flexion, pain to anterior part of shoulder with empty can, pain with hawkins test TTP to latissimus dorsi of left Skin: warm and dry  Assessment & Plan:  Hypertension Blood pressure elevated at 154/101 and remained elevated on repeat at 140s/90s. She has not taken any blood pressure medications in several days. She thinks that she was taking combo pill but is unsure. On chart review combo pill was discontinued a few months ago due to hypotension. P: Restart lisinopril 20 mg Combo pill removed from med list F/u in 4 weeks  Trigger finger She reports several month history of her ring finger on her right hand locking and catching. This causes pain at base of finger. She has tried splinting her finger with a popsicle stick to see if this helps. She is not able to close her fist.  P: Referral placed for trigger finger injection. I will check with [facility] attendings as well to see if we can do injection here.  Shoulder pain, left She has had progressive worsening of left shoulder pain for last several months. She denies specific injury but states that pain wakes her up at night. She spends most of time ambulating with prosthesis but does use  crutches at night for transfers. Crutches makes pain worse. Pain is located to anterior shoulder and radiates towards elbow. She has decreased range of motion. She has been taking NSAIDS and tylenol without improvement in pain. A:  She has limited ROM with multiple positive special test. I do wonder if she had tendinopathy from crutches use that has developed into adhesive capsulitis. P: She is concerned about cost of physical and occupational therapy at this time. I printed exercises that she could try at home to see if pain improves.   Memory changes Revisit at follow-up. Does not seem to be impacting ADLs.   Patient discussed with Dr. Precious Bard Orlinda Slomski, D.O. [facility]  PGY-3 Pager: [phone]  Phone: [phone] Date 10/10/2022  Time 5:43 PM

## 2022-10-10 DIAGNOSIS — M65341 Trigger finger, right ring finger: Secondary | ICD-10-CM | POA: Insufficient documentation

## 2022-10-10 DIAGNOSIS — M6530 Trigger finger, unspecified finger: Secondary | ICD-10-CM | POA: Insufficient documentation

## 2022-10-10 DIAGNOSIS — M25512 Pain in left shoulder: Secondary | ICD-10-CM | POA: Insufficient documentation

## 2022-10-10 NOTE — Assessment & Plan Note (Signed)
Blood pressure elevated at 154/101 and remained elevated on repeat at 140s/90s. She has not taken any blood pressure medications in several days. She thinks that she was taking combo pill but is unsure. On chart review combo pill was discontinued a few months ago due to hypotension. P: Restart lisinopril 20 mg Combo pill removed from med list F/u in 4 weeks

## 2022-10-10 NOTE — Assessment & Plan Note (Signed)
She has had progressive worsening of left shoulder pain for last several months. She denies specific injury but states that pain wakes her up at night. She spends most of time ambulating with prosthesis but does use crutches at night for transfers. Crutches makes pain worse. Pain is located to anterior shoulder and radiates towards elbow. She has decreased range of motion. She has been taking NSAIDS and tylenol without improvement in pain. A: She has limited ROM with multiple positive special test. I do wonder if she had tendinopathy from crutches use that has developed into adhesive capsulitis. P: She is concerned about cost of physical and occupational therapy at this time. I printed exercises that she could try at home to see if pain improves.

## 2022-10-10 NOTE — Assessment & Plan Note (Signed)
She reports several month history of her ring finger on her right hand locking and catching. This causes pain at base of finger. She has tried splinting her finger with a popsicle stick to see if this helps. She is not able to close her fist.  P: Referral placed for trigger finger injection. I will check with [facility] attendings as well to see if we can do injection here.

## 2022-10-10 NOTE — Assessment & Plan Note (Signed)
Revisit at follow-up. Does not seem to be impacting ADLs.

## 2022-10-11 NOTE — Progress Notes (Signed)
Internal Medicine Clinic Attending  Case discussed with Dr. Masters  At the time of the visit.  We reviewed the resident's history and exam and pertinent patient test results.  I agree with the assessment, diagnosis, and plan of care documented in the resident's note.  

## 2022-10-15 ENCOUNTER — Ambulatory Visit (HOSPITAL_COMMUNITY)
Admission: RE | Admit: 2022-10-15 | Discharge: 2022-10-15 | Disposition: A | Discharge status: Home / Self Care | Payer: BC Managed Care – PPO | Source: Ambulatory Visit | Attending: Gastroenterology | Admitting: Gastroenterology

## 2022-10-15 DIAGNOSIS — K7460 Unspecified cirrhosis of liver: Secondary | ICD-10-CM | POA: Insufficient documentation

## 2022-10-15 DIAGNOSIS — Z8619 Personal history of other infectious and parasitic diseases: Secondary | ICD-10-CM | POA: Diagnosis not present

## 2022-10-16 ENCOUNTER — Telehealth: Payer: Self-pay | Admitting: Gastroenterology

## 2022-10-16 NOTE — Telephone Encounter (Signed)
41-month RUQ [place] with elastography reminder in epic.

## 2022-10-16 NOTE — Telephone Encounter (Signed)
Inbound call from patient requesting a call back to discuss ultrasound results from yesterday. Please advise.

## 2022-10-16 NOTE — Telephone Encounter (Signed)
I sent the patient a MyChart result of the exam which was normal.  The recall for the exam was actually for right upper quadrant ultrasound with elastography.  It sounds like it was only regular ultrasound and no elastography was done?.  I would like to repeat another ultrasound in 6 months with the elastography if he can place a recall for that.  Thanks

## 2022-11-01 ENCOUNTER — Ambulatory Visit (INDEPENDENT_AMBULATORY_CARE_PROVIDER_SITE_OTHER): Payer: BC Managed Care – PPO | Admitting: Orthopaedic Surgery

## 2022-11-01 ENCOUNTER — Encounter: Payer: Self-pay | Admitting: Orthopaedic Surgery

## 2022-11-01 DIAGNOSIS — M65341 Trigger finger, right ring finger: Secondary | ICD-10-CM | POA: Diagnosis not present

## 2022-11-01 MED ORDER — LIDOCAINE HCL 1 % IJ SOLN
0.3000 mL | INTRAMUSCULAR | Status: AC | PRN
Start: 2022-11-01 — End: 2022-11-01
  Administered 2022-11-01: .3 mL

## 2022-11-01 MED ORDER — BUPIVACAINE HCL 0.5 % IJ SOLN
0.3300 mL | INTRAMUSCULAR | Status: AC | PRN
Start: 2022-11-01 — End: 2022-11-01
  Administered 2022-11-01: .33 mL

## 2022-11-01 MED ORDER — METHYLPREDNISOLONE ACETATE 40 MG/ML IJ SUSP
13.3300 mg | INTRAMUSCULAR | Status: AC | PRN
Start: 2022-11-01 — End: 2022-11-01
  Administered 2022-11-01: 13.33 mg

## 2022-11-01 NOTE — Progress Notes (Signed)
Office Visit Note   Patient: Stacy Thomas           Date of Birth: [birth year]           MRN: [id number] Visit Date: 11/01/2022              Requested by: Earl Lagos, MD [address], [address] [place],  [place] [address] PCP: Patient, No Pcp Per   Assessment & Plan: Visit Diagnoses:  1. Trigger finger, right ring finger     Plan: Impression is Stacy Thomas is a 62 year old female with right ring trigger finger.  She would like to try another cortisone injection.  I have recommended trigger finger splint from [employer].  Follow-up as needed.  Follow-Up Instructions: No follow-ups on file.   Orders:  Orders Placed This Encounter  Procedures   Hand/UE Inj   No orders of the defined types were placed in this encounter.     Procedures: Hand/UE Inj: R ring A1 for trigger finger on 11/01/2022 8:24 AM Indications: pain Details: 25 G needle Medications: 0.3 mL lidocaine 1 %; 0.33 mL bupivacaine 0.5 %; 13.33 mg methylPREDNISolone acetate 40 MG/ML Outcome: tolerated well, no immediate complications Consent was given by the patient. Patient was prepped and draped in the usual sterile fashion.       Clinical Data: No additional findings.   Subjective: Chief Complaint  Patient presents with   Right Hand - Pain    HPI Stacy Thomas is a 62 year old female who comes in for evaluation of a locking right ring finger.  States that it gets stuck at times.  I did a trigger trigger finger release on her right middle finger couple years ago. Review of Systems  Constitutional: Negative.   HENT: Negative.    Eyes: Negative.   Respiratory: Negative.    Cardiovascular: Negative.   Endocrine: Negative.   Musculoskeletal: Negative.   Neurological: Negative.   Hematological: Negative.   Psychiatric/Behavioral: Negative.    All other systems reviewed and are negative.    Objective: Vital Signs: LMP 12/02/2010   Physical Exam Vitals and nursing note reviewed.  Constitutional:       Appearance: She is well-developed.  HENT:     Head: Normocephalic and atraumatic.  Pulmonary:     Effort: Pulmonary effort is normal.  Abdominal:     Palpations: Abdomen is soft.  Musculoskeletal:     Cervical back: Neck supple.  Skin:    General: Skin is warm.     Capillary Refill: Capillary refill takes less than 2 seconds.  Neurological:     Mental Status: She is alert and oriented to person, place, and time.  Psychiatric:        Behavior: Behavior normal.        Thought Content: Thought content normal.        Judgment: Judgment normal.     Ortho Exam Examination of the right ring finger is consistent with triggering.  She has tenderness of the A1 pulley. Specialty Comments:  No specialty comments available.  Imaging: No results found.   PMFS History: Patient Active Problem List   Diagnosis Date Noted   Trigger finger, right ring finger 10/10/2022   Shoulder pain, left 10/10/2022   Total knee replacement status, right 02/07/2022   Malaise and fatigue 12/05/2021   Prediabetes 02/23/2021   Hair loss 02/23/2021   Memory changes 03/01/2020   CKD (chronic kidney disease) 09/11/2019   HLD (hyperlipidemia) 09/09/2018   Allergic sinusitis 12/03/2017   Hx  of AKA (above knee amputation), left (HCC) 02/18/2017   Unilateral primary osteoarthritis, right knee 02/18/2017   Vitamin D deficiency 05/04/2016   Bilateral primary osteoarthritis of hip 04/26/2016   Bilateral carpal tunnel syndrome 03/29/2016   Screening for cervical cancer 03/06/2016   PVD (peripheral vascular disease) (HCC) 03/05/2016   Pancreatic abnormality 03/05/2016   Hypertension 03/05/2016   Hepatic cirrhosis (HCC) 11/15/2015   Chronic hepatitis C (HCC) 06/15/2015   Prolapsed internal hemorrhoids, grade 4 06/14/2015   Hx of osteosarcoma 06/14/2015   Past Medical History:  Diagnosis Date   Bilateral arm pain    chronic, due to use of crutches   Dependence on crutches    left leg prosthesis   Drug  addiction in remission (HCC)    since 2007  (crack cocaine,  IV drug use)   Hand weakness    Hepatic cirrhosis (HCC) 2018   followed by dr Adela Lank (gi) due to hx chronic hepatitis c   Hiatal hernia    History of esophageal stricture    post dilation x2   History of hepatitis C    pt completed harvoni treatment 2017, cured;  genotype 1a with fibrosis 3/4,  previously seen by dr comer   History of osteosarcoma    1982  left knee---  s/p  AKA LLE--   per pt no recurrence   History of recreational drug use multiple rehab visits   IV drug use for 1 yr and Smoked crack for 30+yrs--  per pt clean since 2007   Hyperlipidemia    Hypertension    Idiopathic peripheral neuropathy    OSA (obstructive sleep apnea)    study in epic 10-27-2018 by dr Frances Furbish,  moderate osa,  pt stated used cpap until approx. 07/ 2021, intolerate of mask   Osteoarthritis    Pancreatic cyst    followed by dr Adela Lank--- per office note bening and stable   Sigmoid diverticulosis    mild   Wears glasses     Family History  Problem Relation Age of Onset   COPD Father    Alcohol abuse Father        Deceased   Hypertension Mother        Living   Healthy Daughter    Healthy Son    Anesthesia problems Neg Hx    Breast cancer Neg Hx    Colon cancer Neg Hx    Esophageal cancer Neg Hx    Rectal cancer Neg Hx    Stomach cancer Neg Hx    Colon polyps Neg Hx     Past Surgical History:  Procedure Laterality Date   ABOVE KNEE LEG AMPUTATION Left 1982   osteosarcoma   BREAST BIOPSY Left 01/2021   x2   CARPAL TUNNEL RELEASE Right 10/18/2021   Procedure: RIGHT CARPAL TUNNEL RELEASE;  Surgeon: Tarry Kos, MD;  Location: [facility];  Service: Orthopedics;  Laterality: Right;   COLONOSCOPY WITH PROPOFOL  last one 04-28-2020  dr Adela Lank   ESOPHAGOGASTRODUODENOSCOPY (EGD) WITH PROPOFOL  last one 11-02-2019  dr Adela Lank   EXCISION BREAST BX  2015   CYST   EXCISION OF SKIN TAG N/A 07/14/2020    Procedure: EXCISION OF ANAL PAPILLA;  Surgeon: Romie Levee, MD;  Location: [facility];  Service: General;  Laterality: N/A;   STERIOD INJECTION Left 10/18/2021   Procedure: LEFT CARPAL TUNNEL INJECTION;  Surgeon: Tarry Kos, MD;  Location: [facility];  Service: Orthopedics;  Laterality:  Left;   TONSILLECTOMY  as child   TOTAL KNEE ARTHROPLASTY Right 02/07/2022   Procedure: RIGHT TOTAL KNEE ARTHROPLASTY;  Surgeon: Nadara Mustard, MD;  Location: [facility] OR;  Service: Orthopedics;  Laterality: Right;   TRANSANAL HEMORRHOIDAL DEARTERIALIZATION N/A 10/27/2015   Procedure: TRANSANAL HEMORRHOIDAL DEARTERIALIZATION;  Surgeon: Karie Soda, MD;  Location[facility];  Service: General;  Laterality: N/A;   UPPER GASTROINTESTINAL ENDOSCOPY     Social History   Occupational History   Not on file  Tobacco Use   Smoking status: Former    Current packs/day: 0.00    Average packs/day: 1 pack/day for 41.7 years (41.7 ttl pk-yrs)    Types: Cigarettes    Start date: 04/02/1973    Quit date: 12/19/2014    Years since quitting: 7.8   Smokeless tobacco: Never  Vaping Use   Vaping status: Never Used  Substance and Sexual Activity   Alcohol use: Never    Alcohol/week: 0.0 standard drinks of alcohol   Drug use: Not Currently    Types: "Crack" cocaine    Comment: recovery addict since 2007   Sexual activity: Not on file

## 2022-11-05 ENCOUNTER — Telehealth: Payer: Self-pay

## 2022-11-05 DIAGNOSIS — K7460 Unspecified cirrhosis of liver: Secondary | ICD-10-CM

## 2022-11-05 DIAGNOSIS — K862 Cyst of pancreas: Secondary | ICD-10-CM

## 2022-11-05 DIAGNOSIS — Z8619 Personal history of other infectious and parasitic diseases: Secondary | ICD-10-CM

## 2022-11-05 NOTE — Telephone Encounter (Signed)
Labs entered. MyChart message to patient to go to the lab one day this week

## 2022-11-05 NOTE — Telephone Encounter (Signed)
-----   Message from [facility] Marylu Lund H sent at 07/19/2022 11:05 AM EDT ----- Regarding: due for labs in August Patient due for CBC, CMET, INR AND AFP in August

## 2022-11-07 ENCOUNTER — Other Ambulatory Visit (INDEPENDENT_AMBULATORY_CARE_PROVIDER_SITE_OTHER): Payer: BC Managed Care – PPO

## 2022-11-07 DIAGNOSIS — Z8619 Personal history of other infectious and parasitic diseases: Secondary | ICD-10-CM

## 2022-11-07 DIAGNOSIS — K862 Cyst of pancreas: Secondary | ICD-10-CM

## 2022-11-07 DIAGNOSIS — K7460 Unspecified cirrhosis of liver: Secondary | ICD-10-CM

## 2022-11-07 LAB — COMPREHENSIVE METABOLIC PANEL
ALT: 17 U/L (ref 0–35)
AST: 25 U/L (ref 0–37)
Albumin: 4.5 g/dL (ref 3.5–5.2)
Alkaline Phosphatase: 98 U/L (ref 39–117)
BUN: 15 mg/dL (ref 6–23)
CO2: 24 mEq/L (ref 19–32)
Calcium: 9.9 mg/dL (ref 8.4–10.5)
Chloride: 104 mEq/L (ref 96–112)
Creatinine, Ser: 1.01 mg/dL (ref 0.40–1.20)
GFR: 59.04 mL/min — ABNORMAL LOW (ref >60.00)
Glucose, Bld: 116 mg/dL — ABNORMAL HIGH (ref 70–99)
Potassium: 3.6 mEq/L (ref 3.5–5.1)
Sodium: 140 mEq/L (ref 135–145)
Total Bilirubin: 0.9 mg/dL (ref 0.2–1.2)
Total Protein: 7.6 g/dL (ref 6.0–8.3)

## 2022-11-07 LAB — CBC WITH DIFFERENTIAL/PLATELET
Basophils Absolute: 0 10*3/uL (ref 0.0–0.1)
Basophils Relative: 0.5 % (ref 0.0–3.0)
Eosinophils Absolute: 0.1 10*3/uL (ref 0.0–0.7)
Eosinophils Relative: 1.6 % (ref 0.0–5.0)
HCT: 43.7 % (ref 36.0–46.0)
Hemoglobin: 14.4 g/dL (ref 12.0–15.0)
Lymphocytes Relative: 41.4 % (ref 12.0–46.0)
Lymphs Abs: 2 10*3/uL (ref 0.7–4.0)
MCHC: 32.9 g/dL (ref 30.0–36.0)
MCV: 87.9 fl (ref 78.0–100.0)
Monocytes Absolute: 0.4 10*3/uL (ref 0.1–1.0)
Monocytes Relative: 8.8 % (ref 3.0–12.0)
Neutro Abs: 2.4 10*3/uL (ref 1.4–7.7)
Neutrophils Relative %: 47.7 % (ref 43.0–77.0)
Platelets: 242 10*3/uL (ref 150.0–400.0)
RBC: 4.97 Mil/uL (ref 3.87–5.11)
RDW: 13.5 % (ref 11.5–15.5)
WBC: 4.9 10*3/uL (ref 4.0–10.5)

## 2022-11-07 LAB — PROTIME-INR
INR: 1.2 ratio — ABNORMAL HIGH (ref 0.8–1.0)
Prothrombin Time: 13 s (ref 9.6–13.1)

## 2022-11-07 NOTE — Telephone Encounter (Signed)
Called and spoke to patient.  She understands she needs to go to the lab one day this week or next

## 2022-11-08 DIAGNOSIS — G4733 Obstructive sleep apnea (adult) (pediatric): Secondary | ICD-10-CM | POA: Diagnosis not present

## 2022-12-03 ENCOUNTER — Other Ambulatory Visit: Payer: Self-pay | Admitting: Internal Medicine

## 2022-12-09 DIAGNOSIS — G4733 Obstructive sleep apnea (adult) (pediatric): Secondary | ICD-10-CM | POA: Diagnosis not present

## 2022-12-20 ENCOUNTER — Ambulatory Visit (INDEPENDENT_AMBULATORY_CARE_PROVIDER_SITE_OTHER): Payer: BC Managed Care – PPO | Admitting: Orthopedic Surgery

## 2022-12-20 ENCOUNTER — Encounter: Payer: Self-pay | Admitting: Orthopedic Surgery

## 2022-12-20 ENCOUNTER — Other Ambulatory Visit (INDEPENDENT_AMBULATORY_CARE_PROVIDER_SITE_OTHER): Payer: BC Managed Care – PPO

## 2022-12-20 DIAGNOSIS — M25512 Pain in left shoulder: Secondary | ICD-10-CM

## 2022-12-20 MED ORDER — METHYLPREDNISOLONE ACETATE 40 MG/ML IJ SUSP
40.0000 mg | INTRAMUSCULAR | Status: AC | PRN
Start: 2022-12-20 — End: 2022-12-20
  Administered 2022-12-20: 40 mg via INTRA_ARTICULAR

## 2022-12-20 MED ORDER — LIDOCAINE HCL 1 % IJ SOLN
5.0000 mL | INTRAMUSCULAR | Status: AC | PRN
Start: 2022-12-20 — End: 2022-12-20
  Administered 2022-12-20: 5 mL

## 2022-12-20 NOTE — Progress Notes (Signed)
Office Visit Note   Patient: Stacy Thomas           Date of Birth: [birth year]           MRN: [id number] Visit Date: 12/20/2022              Requested by: Olegario Messier, MD [address] [place],  [place] [address] PCP: Olegario Messier, MD  Chief Complaint  Patient presents with   Left Shoulder - Pain      HPI: Patient is a 62 year old woman who presents with impingement symptoms of her left shoulder.  She has pain with elevation and internal rotation.  Assessment & Plan: Visit Diagnoses:  1. Acute pain of left shoulder     Plan: Subacromial joint was injected she had good interval relief after the injection.  Follow-Up Instructions: Return if symptoms worsen or fail to improve.   Ortho Exam  Patient is alert, oriented, no adenopathy, well-dressed, normal affect, normal respiratory effort. Examination patient has active abduction and flexion to 90 degrees passively to 120 degrees.  She has good internal and external rotation and does have pain with Neer and Hawkins impingement test.  Imaging: XR Shoulder Left  Result Date: 12/20/2022 2 view radiographs of the left shoulder shows a congruent glenohumeral joint without superior migration of the humeral head.  No images are attached to the encounter.  Labs: Lab Results  Component Value Date   HGBA1C 5.8 (H) 12/05/2021   HGBA1C 5.9 (H) 02/20/2021   HGBA1C 5.9 (H) 02/29/2020     Lab Results  Component Value Date   ALBUMIN 4.5 11/07/2022   ALBUMIN 4.4 05/07/2022   ALBUMIN 3.8 01/29/2022    No results found for: "MG" Lab Results  Component Value Date   VD25OH 15.8 (L) 05/03/2016    No results found for: "PREALBUMIN"    Latest Ref Rng & Units 11/07/2022    1:15 PM 05/07/2022    1:16 PM 01/29/2022    8:47 AM  CBC EXTENDED  WBC 4.0 - 10.5 K/uL 4.9  4.4  4.1   RBC 3.87 - 5.11 Mil/uL 4.97  4.69  4.56   Hemoglobin 12.0 - 15.0 g/dL 19.1  47.8  29.5   HCT 36.0 - 46.0 % 43.7  40.3  40.8   Platelets 150.0 - 400.0  K/uL 242.0  239.0  215   NEUT# 1.4 - 7.7 K/uL 2.4  1.8    Lymph# 0.7 - 4.0 K/uL 2.0  2.1       There is no height or weight on file to calculate BMI.  Orders:  Orders Placed This Encounter  Procedures   XR Shoulder Left   No orders of the defined types were placed in this encounter.    Procedures: Large Joint Inj: L subacromial bursa on 12/20/2022 11:11 AM Indications: diagnostic evaluation and pain Details: 22 G 1.5 in needle, posterior approach  Arthrogram: No  Medications: 5 mL lidocaine 1 %; 40 mg methylPREDNISolone acetate 40 MG/ML Outcome: tolerated well, no immediate complications Procedure, treatment alternatives, risks and benefits explained, specific risks discussed. Consent was given by the patient. Immediately prior to procedure a time out was called to verify the correct patient, procedure, equipment, support staff and site/side marked as required. Patient was prepped and draped in the usual sterile fashion.      Clinical Data: No additional findings.  ROS:  All other systems negative, except as noted in the HPI. Review of Systems  Objective: Vital Signs: LMP 12/02/2010  Specialty Comments:  No specialty comments available.  PMFS History: Patient Active Problem List   Diagnosis Date Noted   Trigger finger, right ring finger 10/10/2022   Shoulder pain, left 10/10/2022   Total knee replacement status, right 02/07/2022   Malaise and fatigue 12/05/2021   Prediabetes 02/23/2021   Hair loss 02/23/2021   Memory changes 03/01/2020   CKD (chronic kidney disease) 09/11/2019   HLD (hyperlipidemia) 09/09/2018   Allergic sinusitis 12/03/2017   Hx of AKA (above knee amputation), left (HCC) 02/18/2017   Unilateral primary osteoarthritis, right knee 02/18/2017   Vitamin D deficiency 05/04/2016   Bilateral primary osteoarthritis of hip 04/26/2016   Bilateral carpal tunnel syndrome 03/29/2016   Screening for cervical cancer 03/06/2016   PVD (peripheral  vascular disease) (HCC) 03/05/2016   Pancreatic abnormality 03/05/2016   Hypertension 03/05/2016   Hepatic cirrhosis (HCC) 11/15/2015   Chronic hepatitis C (HCC) 06/15/2015   Prolapsed internal hemorrhoids, grade 4 06/14/2015   Hx of osteosarcoma 06/14/2015   Past Medical History:  Diagnosis Date   Bilateral arm pain    chronic, due to use of crutches   Dependence on crutches    left leg prosthesis   Drug addiction in remission (HCC)    since 2007  (crack cocaine,  IV drug use)   Hand weakness    Hepatic cirrhosis (HCC) 2018   followed by dr Adela Lank (gi) due to hx chronic hepatitis c   Hiatal hernia    History of esophageal stricture    post dilation x2   History of hepatitis C    pt completed harvoni treatment 2017, cured;  genotype 1a with fibrosis 3/4,  previously seen by dr comer   History of osteosarcoma    1982  left knee---  s/p  AKA LLE--   per pt no recurrence   History of recreational drug use multiple rehab visits   IV drug use for 1 yr and Smoked crack for 30+yrs--  per pt clean since 2007   Hyperlipidemia    Hypertension    Idiopathic peripheral neuropathy    OSA (obstructive sleep apnea)    study in epic 10-27-2018 by dr Frances Furbish,  moderate osa,  pt stated used cpap until approx. 07/ 2021, intolerate of mask   Osteoarthritis    Pancreatic cyst    followed by dr Adela Lank--- per office note bening and stable   Sigmoid diverticulosis    mild   Wears glasses     Family History  Problem Relation Age of Onset   COPD Father    Alcohol abuse Father        Deceased   Hypertension Mother        Living   Healthy Daughter    Healthy Son    Anesthesia problems Neg Hx    Breast cancer Neg Hx    Colon cancer Neg Hx    Esophageal cancer Neg Hx    Rectal cancer Neg Hx    Stomach cancer Neg Hx    Colon polyps Neg Hx     Past Surgical History:  Procedure Laterality Date   ABOVE KNEE LEG AMPUTATION Left 1982   osteosarcoma   BREAST BIOPSY Left 01/2021   x2    CARPAL TUNNEL RELEASE Right 10/18/2021   Procedure: RIGHT CARPAL TUNNEL RELEASE;  Surgeon: Tarry Kos, MD;  Location: [facility];  Service: Orthopedics;  Laterality: Right;   COLONOSCOPY WITH PROPOFOL  last one 04-28-2020  dr Adela Lank   ESOPHAGOGASTRODUODENOSCOPY (EGD)  WITH PROPOFOL  last one 11-02-2019  dr Adela Lank   EXCISION BREAST BX  2015   CYST   EXCISION OF SKIN TAG N/A 07/14/2020   Procedure: EXCISION OF ANAL PAPILLA;  Surgeon: Romie Levee, MD;  Location: [facility];  Service: General;  Laterality: N/A;   STERIOD INJECTION Left 10/18/2021   Procedure: LEFT CARPAL TUNNEL INJECTION;  Surgeon: Tarry Kos, MD;  Location: [facility];  Service: Orthopedics;  Laterality: Left;   TONSILLECTOMY  as child   TOTAL KNEE ARTHROPLASTY Right 02/07/2022   Procedure: RIGHT TOTAL KNEE ARTHROPLASTY;  Surgeon: Nadara Mustard, MD;  Location: [facility];  Service: Orthopedics;  Laterality: Right;   TRANSANAL HEMORRHOIDAL DEARTERIALIZATION N/A 10/27/2015   Procedure: TRANSANAL HEMORRHOIDAL DEARTERIALIZATION;  Surgeon: Karie Soda, MD;  Location: [facility];  Service: General;  Laterality: N/A;   UPPER GASTROINTESTINAL ENDOSCOPY     Social History   Occupational History   Not on file  Tobacco Use   Smoking status: Former    Current packs/day: 0.00    Average packs/day: 1 pack/day for 41.7 years (41.7 ttl pk-yrs)    Types: Cigarettes    Start date: 04/02/1973    Quit date: 12/19/2014    Years since quitting: 8.0   Smokeless tobacco: Never  Vaping Use   Vaping status: Never Used  Substance and Sexual Activity   Alcohol use: Never    Alcohol/week: 0.0 standard drinks of alcohol   Drug use: Not Currently    Types: "Crack" cocaine    Comment: recovery addict since 2007   Sexual activity: Not on file

## 2023-01-08 DIAGNOSIS — G4733 Obstructive sleep apnea (adult) (pediatric): Secondary | ICD-10-CM | POA: Diagnosis not present

## 2023-01-22 ENCOUNTER — Encounter: Payer: Self-pay | Admitting: Student

## 2023-01-22 ENCOUNTER — Ambulatory Visit (INDEPENDENT_AMBULATORY_CARE_PROVIDER_SITE_OTHER): Payer: BC Managed Care – PPO | Admitting: Student

## 2023-01-22 VITALS — BP 157/106 | HR 90 | Temp 99.0°F | Wt 156.3 lb

## 2023-01-22 DIAGNOSIS — K7460 Unspecified cirrhosis of liver: Secondary | ICD-10-CM | POA: Diagnosis not present

## 2023-01-22 DIAGNOSIS — I10 Essential (primary) hypertension: Secondary | ICD-10-CM | POA: Diagnosis not present

## 2023-01-22 DIAGNOSIS — Z89612 Acquired absence of left leg above knee: Secondary | ICD-10-CM | POA: Diagnosis not present

## 2023-01-22 DIAGNOSIS — Z23 Encounter for immunization: Secondary | ICD-10-CM | POA: Diagnosis not present

## 2023-01-22 DIAGNOSIS — G5603 Carpal tunnel syndrome, bilateral upper limbs: Secondary | ICD-10-CM

## 2023-01-22 MED ORDER — DULOXETINE HCL 30 MG PO CPEP: 30.0000 mg | ORAL_CAPSULE | Freq: Every day | ORAL | 2 refills | Status: DC

## 2023-01-22 MED ORDER — LISINOPRIL 20 MG PO TABS
20.0000 mg | ORAL_TABLET | Freq: Every day | ORAL | 3 refills | Status: DC
Start: 2023-01-22 — End: 2023-12-19

## 2023-01-22 MED ORDER — HYDROCHLOROTHIAZIDE 12.5 MG PO CAPS: 12.5000 mg | ORAL_CAPSULE | Freq: Every day | ORAL | 3 refills | Status: DC

## 2023-01-22 NOTE — Patient Instructions (Addendum)
Thank you so much for coming to the clinic today!   I am sending in that blood pressure medication called hydrochlorothiazide which should help with your blood pressure.  I like to see you back in about a month just so we can evaluate your blood pressure again.  For your phantom limb pain, I have sent in a medication called Cymbalta which should help, please take these at night and only when you are having the pain.  Please speak with your stomach doctor about setting up a colonoscopy.  If you have any questions please feel free to the call the clinic at anytime at [phone]. It was a pleasure seeing you!  Best, Dr. Thomasene Ripple

## 2023-01-24 ENCOUNTER — Encounter: Payer: Self-pay | Admitting: Student

## 2023-01-24 NOTE — Progress Notes (Signed)
CC: Blood pressure check  HPI:  [name] is a 62 y.o. female living with a history stated below and presents today for blood pressure check. Please see problem based assessment and plan for additional details.  Past Medical History:  Diagnosis Date   Bilateral arm pain    chronic, due to use of crutches   Dependence on crutches    left leg prosthesis   Drug addiction in remission (HCC)    since 2007  (crack cocaine,  IV drug use)   Hair loss 02/23/2021   Hand weakness    Hepatic cirrhosis (HCC) 2018   followed by dr Stacy Thomas (gi) due to hx chronic hepatitis c   Hiatal hernia    History of esophageal stricture    post dilation x2   History of hepatitis C    pt completed harvoni treatment 2017, cured;  genotype 1a with fibrosis 3/4,  previously seen by dr Stacy Thomas   History of osteosarcoma    1982  left knee---  s/p  AKA LLE--   per pt no recurrence   History of recreational drug use multiple rehab visits   IV drug use for 1 yr and Smoked crack for 30+yrs--  per pt clean since 2007   Hyperlipidemia    Hypertension    Idiopathic peripheral neuropathy    Malaise and fatigue 12/05/2021   Memory changes 03/01/2020   OSA (obstructive sleep apnea)    study in epic 10-27-2018 by dr Stacy Thomas,  moderate osa,  pt stated used cpap until approx. 07/ 2021, intolerate of mask   Osteoarthritis    Pancreatic cyst    followed by dr Stacy Thomas--- per office note bening and stable   Prolapsed internal hemorrhoids, grade 4 06/14/2015   Sigmoid diverticulosis    mild   Wears glasses     Current Outpatient Medications on File Prior to Visit  Medication Sig Dispense Refill   Apoaequorin (PREVAGEN) 10 MG CAPS Take 10 mg by mouth daily.     atorvastatin (LIPITOR) 40 MG tablet Take 1 tablet by mouth once daily 90 tablet 3   No current facility-administered medications on file prior to visit.    Family History  Problem Relation Age of Onset   COPD Father    Alcohol abuse Father         Deceased   Hypertension Mother        Living   Healthy Daughter    Healthy Son    Anesthesia problems Neg Hx    Breast cancer Neg Hx    Colon cancer Neg Hx    Esophageal cancer Neg Hx    Rectal cancer Neg Hx    Stomach cancer Neg Hx    Colon polyps Neg Hx     Social History   Socioeconomic History   Marital status: Married    Spouse name: Not on file   Number of children: Not on file   Years of education: Not on file   Highest education level: Not on file  Occupational History   Not on file  Tobacco Use   Smoking status: Former    Current packs/day: 0.00    Average packs/day: 1 pack/day for 41.7 years (41.7 ttl pk-yrs)    Types: Cigarettes    Start date: 04/02/1973    Quit date: 12/19/2014    Years since quitting: 8.1   Smokeless tobacco: Never  Vaping Use   Vaping status: Never Used  Substance and Sexual Activity   Alcohol  use: Never    Alcohol/week: 0.0 standard drinks of alcohol   Drug use: Not Currently    Types: "Crack" cocaine    Comment: recovery addict since 2007   Sexual activity: Not on file  Other Topics Concern   Not on file  Social History Narrative   Lives with husband in a one story home.  Has 3 children.  Does not work.    Education: some college.   Social Determinants of Health   Financial Resource Strain: Not on file  Food Insecurity: No Food Insecurity (02/08/2022)   Hunger Vital Sign    Worried About Running Out of Food in the Last Year: Never true    Ran Out of Food in the Last Year: Never true  Transportation Needs: No Transportation Needs (02/08/2022)   PRAPARE - Administrator, Civil Service (Medical): No    Lack of Transportation (Non-Medical): No  Physical Activity: Not on file  Stress: Not on file  Social Connections: Not on file  Intimate Partner Violence: Not At Risk (02/08/2022)   Humiliation, Afraid, Rape, and Kick questionnaire    Fear of Current or Ex-Partner: No    Emotionally Abused: No    Physically Abused:  No    Sexually Abused: No    Review of Systems: ROS negative except for what is noted on the assessment and plan.  Vitals:   01/22/23 1546 01/22/23 1632  BP: (!) 151/99 (!) 157/106  Pulse: (!) 106 90  Temp: 99 F (37.2 C)   TempSrc: Oral   SpO2: 98%   Weight: 156 lb 4.8 oz (70.9 kg)     Physical Exam: Constitutional: well-appearing female  in no acute distress HENT: normocephalic atraumatic, mucous membranes moist Eyes: conjunctiva non-erythematous Neck: supple Cardiovascular: regular rate and rhythm, no m/r/g Pulmonary/Chest: normal work of breathing on room air, lungs clear to auscultation bilaterally Abdominal: soft, non-tender, non-distended MSK: Left AKA   Assessment & Plan:   Hypertension Patient presents for follow-up for her hypertension.  Her regimen is currently lisinopril 20 mg, and her blood pressure today is 151/99.  She does not check her blood pressure at home.  Will add on hydrochlorothiazide 12.5 mg and follow-up in a month for BMP and blood pressure check.  Of note, she was previously on a combination pill of lisinopril and hydrochlorothiazide however this was discontinued due to hypotension previously.  Plan: - Continue lisinopril 20 mg - Start hydrochlorothiazide 12.5 mg  Hepatic cirrhosis (HCC) Recently saw GI in April 2024, follow-up ultrasounding and lab work all within normal limits.  Will now follow-up with GI yearly.  She does not like volume overloaded on exam, does not have any complaints regarding this.  Hx of AKA (above knee amputation), left (HCC) Patient with history of osteosarcoma status post left AKA, stable no changes.  Bilateral carpal tunnel syndrome Has been on duloxetine for this, she is not interested in getting surgery at this time.  Encourage patient to use bilateral wrist splints, in the meanwhile I will refill her duloxetine.  Patient discussed with Dr. Kae Thomas Stacy Thomas, M.D. [facility],  PGY-2 Pager: [phone] Date 01/24/2023 Time 8:48 AM

## 2023-01-24 NOTE — Assessment & Plan Note (Signed)
Patient presents for follow-up for her hypertension.  Her regimen is currently lisinopril 20 mg, and her blood pressure today is 151/99.  She does not check her blood pressure at home.  Will add on hydrochlorothiazide 12.5 mg and follow-up in a month for BMP and blood pressure check.  Of note, she was previously on a combination pill of lisinopril and hydrochlorothiazide however this was discontinued due to hypotension previously.  Plan: - Continue lisinopril 20 mg - Start hydrochlorothiazide 12.5 mg

## 2023-01-24 NOTE — Assessment & Plan Note (Signed)
Has been on duloxetine for this, she is not interested in getting surgery at this time.  Encourage patient to use bilateral wrist splints, in the meanwhile I will refill her duloxetine.

## 2023-01-24 NOTE — Assessment & Plan Note (Signed)
Patient with history of osteosarcoma status post left AKA, stable no changes.

## 2023-01-24 NOTE — Assessment & Plan Note (Signed)
Recently saw GI in April 2024, follow-up ultrasounding and lab work all within normal limits.  Will now follow-up with GI yearly.  She does not like volume overloaded on exam, does not have any complaints regarding this.

## 2023-01-25 NOTE — Progress Notes (Signed)
Internal Medicine Clinic Attending  I was physically present during the key portions of the resident provided service and participated in the medical decision making of patient's management care. I reviewed pertinent patient test results.  The assessment, diagnosis, and plan were formulated together and I agree with the documentation in the resident's note.  Reymundo Poll, MD

## 2023-01-25 NOTE — Addendum Note (Signed)
Addended by: Burnell Blanks on: 01/25/2023 03:31 PM   Modules accepted: Level of Service

## 2023-03-07 ENCOUNTER — Other Ambulatory Visit (HOSPITAL_BASED_OUTPATIENT_CLINIC_OR_DEPARTMENT_OTHER): Payer: Self-pay

## 2023-03-07 ENCOUNTER — Ambulatory Visit (INDEPENDENT_AMBULATORY_CARE_PROVIDER_SITE_OTHER): Payer: BC Managed Care – PPO | Admitting: Student

## 2023-03-07 ENCOUNTER — Ambulatory Visit (INDEPENDENT_AMBULATORY_CARE_PROVIDER_SITE_OTHER): Payer: BC Managed Care – PPO

## 2023-03-07 ENCOUNTER — Encounter (HOSPITAL_BASED_OUTPATIENT_CLINIC_OR_DEPARTMENT_OTHER): Payer: Self-pay | Admitting: Student

## 2023-03-07 DIAGNOSIS — M25512 Pain in left shoulder: Secondary | ICD-10-CM

## 2023-03-07 DIAGNOSIS — M47812 Spondylosis without myelopathy or radiculopathy, cervical region: Secondary | ICD-10-CM | POA: Diagnosis not present

## 2023-03-07 DIAGNOSIS — M542 Cervicalgia: Secondary | ICD-10-CM

## 2023-03-07 MED ORDER — METHYLPREDNISOLONE 4 MG PO TBPK
ORAL_TABLET | ORAL | 0 refills | Status: DC
  Filled 2023-03-07: qty 21, 6d supply, fill #0

## 2023-03-07 NOTE — Progress Notes (Signed)
Chief Complaint: Left arm pain     History of Present Illness:    Stacy Thomas is a pleasant 62 y.o. female here today for evaluation of pain in her left shoulder and arm.  She denies any injury but states that this has been ongoing for a few months.  Her pain has been worsening over the past week.  She did see Dr. Lajoyce Corners on 9/19 for her left shoulder and received a subacromial cortisone injection which did not ultimately get her much relief.  States that she has some numbness and tingling in her left hand and fingers which she does have a known history of carpal tunnel syndrome and has had a release on the right side.  Does have significant history of a left above-the-knee amputation and right TKA therefore uses crutches at baseline which are now extremely painful to use.  Denies any other significant treatments.   Surgical History:   None of cervical spine or left upper extremity  PMH/PSH/Family History/Social History/Meds/Allergies:    Past Medical History:  Diagnosis Date   Bilateral arm pain    chronic, due to use of crutches   Dependence on crutches    left leg prosthesis   Drug addiction in remission (HCC)    since 2007  (crack cocaine,  IV drug use)   Hair loss 02/23/2021   Hand weakness    Hepatic cirrhosis (HCC) 2018   followed by dr Adela Lank (gi) due to hx chronic hepatitis c   Hiatal hernia    History of esophageal stricture    post dilation x2   History of hepatitis C    pt completed harvoni treatment 2017, cured;  genotype 1a with fibrosis 3/4,  previously seen by dr comer   History of osteosarcoma    1982  left knee---  s/p  AKA LLE--   per pt no recurrence   History of recreational drug use multiple rehab visits   IV drug use for 1 yr and Smoked crack for 30+yrs--  per pt clean since 2007   Hyperlipidemia    Hypertension    Idiopathic peripheral neuropathy    Malaise and fatigue 12/05/2021   Memory changes 03/01/2020    OSA (obstructive sleep apnea)    study in epic 10-27-2018 by dr Frances Furbish,  moderate osa,  pt stated used cpap until approx. 07/ 2021, intolerate of mask   Osteoarthritis    Pancreatic cyst    followed by dr Adela Lank--- per office note bening and stable   Prolapsed internal hemorrhoids, grade 4 06/14/2015   Sigmoid diverticulosis    mild   Wears glasses    Past Surgical History:  Procedure Laterality Date   ABOVE KNEE LEG AMPUTATION Left 1982   osteosarcoma   BREAST BIOPSY Left 01/2021   x2   CARPAL TUNNEL RELEASE Right 10/18/2021   Procedure: RIGHT CARPAL TUNNEL RELEASE;  Surgeon: Tarry Kos, MD;  Location: [facility];  Service: Orthopedics;  Laterality: Right;   COLONOSCOPY WITH PROPOFOL  last one 04-28-2020  dr Adela Lank   ESOPHAGOGASTRODUODENOSCOPY (EGD) WITH PROPOFOL  last one 11-02-2019  dr Adela Lank   EXCISION BREAST BX  2015   CYST   EXCISION OF SKIN TAG N/A 07/14/2020   Procedure: EXCISION OF ANAL PAPILLA;  Surgeon: Romie Levee, MD;  Location: [facility];  Service: General;  Laterality: N/A;   STERIOD INJECTION Left 10/18/2021   Procedure: LEFT CARPAL TUNNEL INJECTION;  Surgeon: Tarry Kos, MD;  Location: [facility];  Service: Orthopedics;  Laterality: Left;   TONSILLECTOMY  as child   TOTAL KNEE ARTHROPLASTY Right 02/07/2022   Procedure: RIGHT TOTAL KNEE ARTHROPLASTY;  Surgeon: Nadara Mustard, MD;  Location: [facility];  Service: Orthopedics;  Laterality: Right;   TRANSANAL HEMORRHOIDAL DEARTERIALIZATION N/A 10/27/2015   Procedure: TRANSANAL HEMORRHOIDAL DEARTERIALIZATION;  Surgeon: Karie Soda, MD;  Location: [facility];  Service: General;  Laterality: N/A;   UPPER GASTROINTESTINAL ENDOSCOPY     Social History   Socioeconomic History   Marital status: Married    Spouse name: Not on file   Number of children: Not on file   Years of education: Not on file   Highest education level: Not on file   Occupational History   Not on file  Tobacco Use   Smoking status: Former    Current packs/day: 0.00    Average packs/day: 1 pack/day for 41.7 years (41.7 ttl pk-yrs)    Types: Cigarettes    Start date: 04/02/1973    Quit date: 12/19/2014    Years since quitting: 8.2   Smokeless tobacco: Never  Vaping Use   Vaping status: Never Used  Substance and Sexual Activity   Alcohol use: Never    Alcohol/week: 0.0 standard drinks of alcohol   Drug use: Not Currently    Types: "Crack" cocaine    Comment: recovery addict since 2007   Sexual activity: Not on file  Other Topics Concern   Not on file  Social History Narrative   Lives with husband in a one story home.  Has 3 children.  Does not work.    Education: some college.   Social Determinants of Health   Financial Resource Strain: Not on file  Food Insecurity: No Food Insecurity (02/08/2022)   Hunger Vital Sign    Worried About Running Out of Food in the Last Year: Never true    Ran Out of Food in the Last Year: Never true  Transportation Needs: No Transportation Needs (02/08/2022)   PRAPARE - Administrator, Civil Service (Medical): No    Lack of Transportation (Non-Medical): No  Physical Activity: Not on file  Stress: Not on file  Social Connections: Not on file   Family History  Problem Relation Age of Onset   COPD Father    Alcohol abuse Father        Deceased   Hypertension Mother        Living   Healthy Daughter    Healthy Son    Anesthesia problems Neg Hx    Breast cancer Neg Hx    Colon cancer Neg Hx    Esophageal cancer Neg Hx    Rectal cancer Neg Hx    Stomach cancer Neg Hx    Colon polyps Neg Hx    Allergies  Allergen Reactions   Morphine Swelling    REACTION: face swells   Codeine Other (See Comments)    Avoids due to being recovery addict   Current Outpatient Medications  Medication Sig Dispense Refill   methylPREDNISolone (MEDROL DOSEPAK) 4 MG TBPK tablet Take per packet instructions 21  each 0   Apoaequorin (PREVAGEN) 10 MG CAPS Take 10 mg by mouth daily.     atorvastatin (LIPITOR) 40 MG tablet Take 1 tablet by mouth once daily  90 tablet 3   DULoxetine (CYMBALTA) 30 MG capsule Take 1 capsule (30 mg total) by mouth daily. 30 capsule 2   hydrochlorothiazide (MICROZIDE) 12.5 MG capsule Take 1 capsule (12.5 mg total) by mouth daily. 90 capsule 3   lisinopril (ZESTRIL) 20 MG tablet Take 1 tablet (20 mg total) by mouth daily. 90 tablet 3   No current facility-administered medications for this visit.   No results found.  Review of Systems:   A ROS was performed including pertinent positives and negatives as documented in the HPI.  Physical Exam :   Constitutional: NAD and appears stated age Neurological: Alert and oriented Psych: Appropriate affect and cooperative Last menstrual period 12/02/2010.   Comprehensive Musculoskeletal Exam:    Tenderness to palpation over the cervical spine as well as left upper trapezius and scalenes.  Range of motion is limited with cervical motions in all planes.  Negative Spurling's.  Tenderness throughout the left shoulder and upper arm.  Active shoulder ROM to 90 degrees forward flexion, 20 degrees external rotation, and internal rotation to back pocket.  Grip strength well-maintained.  Imaging:   Xray (cervical spine 4 views): Diffuse degenerative changes noted between C4-C7 with decreased disc space and anterior osteophytes.  Loss of lordotic curvature.   I personally reviewed and interpreted the radiographs.   Assessment:   62 y.o. female with chief complaint of left shoulder pain.  Subacromial cortisone injection over 2 months ago did not get her any relief.  She does have pain in her cervical spine as well as notable degenerative changes therefore I am suspicious for this to be at least a contributor of her pain.  After discussion of treatment options patient ultimately did decide to proceed with a trial of physical therapy to focus  on her neck and shoulder.  I will plan to send her a Medrol Dosepak with aims to get her some symptomatic relief.  She is an established patient with Dr. Lajoyce Corners so I discussed that she can follow-up with him after a few weeks of PT to assess progress and further manage.  Plan :    -Start Medrol Dosepak -Referral to physical therapy for cervicalgia and left shoulder pain -Continue following with Dr. Lajoyce Corners     I personally saw and evaluated the patient, and participated in the management and treatment plan.  Hazle Nordmann, PA-C Orthopedics

## 2023-03-13 ENCOUNTER — Encounter: Payer: Self-pay | Admitting: Student

## 2023-03-13 ENCOUNTER — Other Ambulatory Visit: Payer: Self-pay | Admitting: Student

## 2023-03-13 DIAGNOSIS — Z0000 Encounter for general adult medical examination without abnormal findings: Secondary | ICD-10-CM

## 2023-03-18 ENCOUNTER — Encounter: Payer: Self-pay | Admitting: Gastroenterology

## 2023-03-19 ENCOUNTER — Encounter: Payer: Self-pay | Admitting: Physical Therapy

## 2023-03-19 ENCOUNTER — Ambulatory Visit (INDEPENDENT_AMBULATORY_CARE_PROVIDER_SITE_OTHER): Payer: BC Managed Care – PPO | Admitting: Physical Therapy

## 2023-03-19 DIAGNOSIS — G8929 Other chronic pain: Secondary | ICD-10-CM

## 2023-03-19 DIAGNOSIS — M6281 Muscle weakness (generalized): Secondary | ICD-10-CM | POA: Diagnosis not present

## 2023-03-19 DIAGNOSIS — M25512 Pain in left shoulder: Secondary | ICD-10-CM | POA: Diagnosis not present

## 2023-03-19 DIAGNOSIS — M542 Cervicalgia: Secondary | ICD-10-CM

## 2023-03-19 NOTE — Therapy (Signed)
OUTPATIENT PHYSICAL THERAPY SHOULDER/NECK EVALUATION   Patient Name: Stacy Thomas MRN: [id number] DOB:Apr 12, 1960, 62 y.o., female Today's Date: 03/19/2023 s END OF SESSION:  PT End of Session - 03/19/23 0906     Visit Number 1    Number of Visits 12    Date for PT Re-Evaluation 05/14/23    PT Start Time 0805    PT Stop Time 0845    PT Time Calculation (min) 40 min    Activity Tolerance Patient tolerated treatment well;Patient limited by pain    Behavior During Therapy [facility] for tasks assessed/performed             Past Medical History:  Diagnosis Date   Bilateral arm pain    chronic, due to use of crutches   Dependence on crutches    left leg prosthesis   Drug addiction in remission (HCC)    since 2007  (crack cocaine,  IV drug use)   Hair loss 02/23/2021   Hand weakness    Hepatic cirrhosis (HCC) 2018   followed by dr Adela Lank (gi) due to hx chronic hepatitis c   Hiatal hernia    History of esophageal stricture    post dilation x2   History of hepatitis C    pt completed harvoni treatment 2017, cured;  genotype 1a with fibrosis 3/4,  previously seen by dr comer   History of osteosarcoma    1982  left knee---  s/p  AKA LLE--   per pt no recurrence   History of recreational drug use multiple rehab visits   IV drug use for 1 yr and Smoked crack for 30+yrs--  per pt clean since 2007   Hyperlipidemia    Hypertension    Idiopathic peripheral neuropathy    Malaise and fatigue 12/05/2021   Memory changes 03/01/2020   OSA (obstructive sleep apnea)    study in epic 10-27-2018 by dr Frances Furbish,  moderate osa,  pt stated used cpap until approx. 07/ 2021, intolerate of mask   Osteoarthritis    Pancreatic cyst    followed by dr Adela Lank--- per office note bening and stable   Prolapsed internal hemorrhoids, grade 4 06/14/2015   Sigmoid diverticulosis    mild   Wears glasses    Past Surgical History:  Procedure Laterality Date   ABOVE KNEE LEG AMPUTATION Left 1982    osteosarcoma   BREAST BIOPSY Left 01/2021   x2   CARPAL TUNNEL RELEASE Right 10/18/2021   Procedure: RIGHT CARPAL TUNNEL RELEASE;  Surgeon: Tarry Kos, MD;  Location: [facility];  Service: Orthopedics;  Laterality: Right;   COLONOSCOPY WITH PROPOFOL  last one 04-28-2020  dr Adela Lank   ESOPHAGOGASTRODUODENOSCOPY (EGD) WITH PROPOFOL  last one 11-02-2019  dr Adela Lank   EXCISION BREAST BX  2015   CYST   EXCISION OF SKIN TAG N/A 07/14/2020   Procedure: EXCISION OF ANAL PAPILLA;  Surgeon: Romie Levee, MD;  Location: [facility];  Service: General;  Laterality: N/A;   STERIOD INJECTION Left 10/18/2021   Procedure: LEFT CARPAL TUNNEL INJECTION;  Surgeon: Tarry Kos, MD;  Location:  SUR[facility]ERY C[facility];  Service: Orthopedics;  Laterality: Left;   TONSILLECTOMY  as child   TOTAL KNEE ARTHROPLASTY Right 02/07/2022   Procedure: RIGHT TOTAL KNEE ARTHROPLASTY;  Surgeon: Nadara Mustard, MD;  Location: [facility];  Service: Orthopedics;  Laterality: Right;   TRANSANAL HEMORRHOIDAL DEARTERIALIZATION N/A 10/27/2015   Procedure: TRANSANAL HEMORRHOIDAL DEARTERIALIZATION;  Surgeon: Karie Soda, MD;  Location: [facility];  Service: General;  Laterality: N/A;   UPPER GASTROINTESTINAL ENDOSCOPY     Patient Active Problem List   Diagnosis Date Noted   Trigger finger, right ring finger 10/10/2022   Shoulder pain, left 10/10/2022   Total knee replacement status, right 02/07/2022   Prediabetes 02/23/2021   CKD (chronic kidney disease) 09/11/2019   HLD (hyperlipidemia) 09/09/2018   Allergic sinusitis 12/03/2017   Hx of AKA (above knee amputation), left (HCC) 02/18/2017   Unilateral primary osteoarthritis, right knee 02/18/2017   Vitamin D deficiency 05/04/2016   Bilateral primary osteoarthritis of hip 04/26/2016   Bilateral carpal tunnel syndrome 03/29/2016   Screening for cervical cancer 03/06/2016   PVD (peripheral vascular disease) (HCC) 03/05/2016    Pancreatic abnormality 03/05/2016   Hypertension 03/05/2016   Hepatic cirrhosis (HCC) 11/15/2015   Chronic hepatitis C (HCC) 06/15/2015   Hx of osteosarcoma 06/14/2015    PCP: Olegario Messier, MD   REFERRING PROVIDER: Amador Cunas, PA*   REFERRING DIAG: [phone] (ICD-10-CM) - Acute pain of left shoulder Neck pain  THERAPY DIAG:  Chronic left shoulder pain  Cervicalgia  Muscle weakness (generalized)  Rationale for Evaluation and Treatment: Rehabilitation  ONSET DATE: chronic left arm pain >48 years  SUBJECTIVE:                                                                                                                                                                                      SUBJECTIVE STATEMENT: 62 y.o. female with chief complaint of left shoulder pain.  She says this has been going on since long term use of crutches due to above knee amputation. She does relay some N/T down her arm Subacromial cortisone injection over 2 months ago did not get her much relief.  She does have pain in her cervical spine as well as notable degenerative changes. This is affecting her sleep Hand dominance: Left  PERTINENT HISTORY:s/p Rt TKA on 02/07/22 with PMH significant for cirrhosis, HLD, HTN, OSA, OA, Lt AKA in 1982.   PAIN:  NPRS scale: 7/10 upon arrival Pain location:left shoulder and neck Pain description: numb, "can't explain the pain really" Aggravating factors: sleeping, walking the crutches,  Relieving factors: rest, meds   PRECAUTIONS: None  RED FLAGS: None   WEIGHT BEARING RESTRICTIONS: No  FALLS:  Has patient fallen in last 6 months? No  OCCUPATION:    PLOF: Independent  PATIENT GOALS:reduce pain, sleep better  NEXT MD VISIT: nothing scheduled at eval  OBJECTIVE:  Note: Objective measures were completed at Evaluation unless otherwise noted.  DIAGNOSTIC FINDINGS:  Xray (cervical spine 4 views): Diffuse degenerative changes noted  between  C4-C7 with decreased disc space and anterior osteophytes.  Loss of lordotic curvature.  2 view radiographs of the left shoulder shows a congruent glenohumeral  joint without superior migration of the humeral head.    PATIENT SURVEYS:  Eval FOTO 18% functional goal is 56%  COGNITION: Overall cognitive status: Within functional limits for tasks assessed     SENSATION: WFL   UPPER EXTREMITY and NECK ROM: PROM is [facility]  Active ROM Left Eval   Shoulder flexion 110   Shoulder extension    Shoulder abduction 110   Shoulder adduction    Shoulder internal rotation 40   Shoulder external rotation 40   Neck flexion 50%   NECK  extension 10%   Neck right rotation 25%   Neck left rotation 25%   Neck right lateral flexion 25%   Neck left lateral flexion 25%   (Blank rows = not tested)  UPPER EXTREMITY MMT:  MMT Left eval   Shoulder flexion 4   Shoulder extension    Shoulder abduction 4   Shoulder adduction    Shoulder internal rotation 4   Shoulder external rotation 4   Middle trapezius    Lower trapezius    Elbow flexion    Elbow extension    Wrist flexion    Wrist extension    Wrist ulnar deviation    Wrist radial deviation    Wrist pronation    Wrist supination    Grip strength (lbs)    (Blank rows = not tested)  SHOULDER SPECIAL TESTS: Impingement tests: Neer impingement test: positive , Hawkins/Kennedy impingement test: positive , and Painful arc test: positive   Spurlings test for cervical radiculpathy is positive for left side   PALPATION:  Pain and tenderness in left upper traps and deltoids   TODAY'S TREATMENT:  Eval HEP creation and review with demonstration   Attempted DN to left deltoid but she wanted to discontinue this before needle was fully inserted due to pain  IFC Estim X 10 min on left neck and shoulder with intensity turned up to tolerance and moist heat applied  Provided her with biofreeze samples to try.    PATIENT  EDUCATION: Education details: HEP, PT plan of care Person educated: Patient Education method: Explanation, Demonstration, Verbal cues, and Handouts Education comprehension: verbalized understanding and needs further education   HOME EXERCISE PROGRAM: Access Code: [email or web address] URL: [email or web address] Date: 03/19/2023 Prepared by: Ivery Quale  Exercises - Seated Passive Cervical Retraction  - 2 x daily - 6 x weekly - 2 sets - 10 reps - Seated Scapular Retraction  - 2 x daily - 6 x weekly - 2-3 sets - 10 reps - 5 sec hold - Seated Assisted Cervical Rotation with Towel  - 2 x daily - 6 x weekly - 1 sets - 10 reps - 5 hold - Standing Shoulder Posterior Capsule Stretch  - 2 x daily - 3-4 x weekly - 1 sets - 10 reps - 5 hold - Doorway Pec Stretch at 90 Degrees Abduction  - 2 x daily - 3-4 x weekly - 1 sets - 3 reps - 30 hold - Standing Shoulder Flexion AAROM with Dowel  - 2 x daily - 6 x weekly - 1-2 sets - 10 reps  Patient Education - TENS Therapy  ASSESSMENT:  CLINICAL IMPRESSION: Patient referred to PT for chronic left shoulder and neck pain. There are some signs consistent with  cervical radiculopathy as well as shoulder impingement. Patient will benefit from skilled PT  to address below impairments, limitations and improve overall function.  OBJECTIVE IMPAIRMENTS: decreased activity tolerance, decreased ROM, decreased strength, impaired flexibility, impaired UE use, postural dysfunction, and pain.  ACTIVITY LIMITATIONS: reaching, lifting, carry,  cleaning, driving, and sleeping  PERSONAL FACTORS: see above PMH also affecting patient's functional outcome.  REHAB POTENTIAL: Good  CLINICAL DECISION MAKING: Stable/uncomplicated  EVALUATION COMPLEXITY: Low    GOALS: Short term PT Goals Target date: 04/16/2023   Pt will be I and compliant with HEP. Baseline:  Goal status: New Pt will decrease pain by 25% overall Baseline: Goal status: New  Long term PT goals  Target date:05/14/2023   Pt will improve neck and Lt shoulder AROM to [facility] to improve functional reaching Baseline: Goal status: New Pt will improve  Lt shoulder strength to at least 4+/5 MMT to improve functional strength Baseline: Goal status: New Pt will improve FOTO to at least 56% functional to show improved function Baseline: Goal status: New Pt will reduce pain to overall less than 3/10 with usual activity and sleeping Baseline: Goal status: New  PLAN: PT FREQUENCY: 1-2 times per week   PT DURATION: 4-8 weeks  PLANNED INTERVENTIONS (unless contraindicated): aquatic PT, Canalith repositioning, cryotherapy, Electrical stimulation, Iontophoresis with 4 mg/ml dexamethasome, Moist heat, traction, Ultrasound, gait training, Therapeutic exercise, balance training, neuromuscular re-education, patient/family education, prosthetic training, manual techniques, passive ROM, dry needling, taping, vasopnuematic device, vestibular, spinal manipulations, joint manipulations 97110-Therapeutic exercises, 97530- Therapeutic activity, 97112- Neuromuscular re-education, 97535- Self Care, and 86578- Manual therapy  PLAN FOR NEXT SESSION: review HEP, how was TENS therapy and repeat if desired, she did not like DN, consider traction   April Manson, PT,DPT 03/19/2023, 9:06 AM

## 2023-04-09 ENCOUNTER — Ambulatory Visit
Admission: RE | Admit: 2023-04-09 | Discharge: 2023-04-09 | Disposition: A | Discharge status: Home / Self Care | Payer: BC Managed Care – PPO | Source: Ambulatory Visit | Attending: Internal Medicine | Admitting: Internal Medicine

## 2023-04-09 DIAGNOSIS — Z1231 Encounter for screening mammogram for malignant neoplasm of breast: Secondary | ICD-10-CM | POA: Diagnosis not present

## 2023-04-09 DIAGNOSIS — Z0000 Encounter for general adult medical examination without abnormal findings: Secondary | ICD-10-CM

## 2023-04-18 ENCOUNTER — Ambulatory Visit (AMBULATORY_SURGERY_CENTER): Payer: BC Managed Care – PPO

## 2023-04-18 VITALS — Ht 62.0 in | Wt 156.0 lb

## 2023-04-18 DIAGNOSIS — Z860100 Personal history of colon polyps, unspecified: Secondary | ICD-10-CM

## 2023-04-18 MED ORDER — NA SULFATE-K SULFATE-MG SULF 17.5-3.13-1.6 GM/177ML PO SOLN: 1.0000 | Freq: Once | ORAL | 0 refills | Status: AC

## 2023-04-18 NOTE — Progress Notes (Signed)

## 2023-04-22 ENCOUNTER — Telehealth: Payer: Self-pay

## 2023-04-22 DIAGNOSIS — R5383 Other fatigue: Secondary | ICD-10-CM

## 2023-04-22 DIAGNOSIS — K7460 Unspecified cirrhosis of liver: Secondary | ICD-10-CM

## 2023-04-22 DIAGNOSIS — Z8619 Personal history of other infectious and parasitic diseases: Secondary | ICD-10-CM

## 2023-04-22 NOTE — Telephone Encounter (Signed)
-----   Message from Nurse Wallace B sent at 10/16/2022  4:45 PM EDT ----- Regarding: 66-month RUQ [place] RUQ [place] with elastography - need to order

## 2023-04-22 NOTE — Telephone Encounter (Signed)
RUQ [place] with elastography order in epic. Secure staff message sent to radiology scheduling to contact patient to set up appt.   MyChart message sent to patient with imaging reminder.

## 2023-04-29 NOTE — Telephone Encounter (Signed)
RUQ [place] scheduled on 05/02/23 at 8:30 am

## 2023-04-30 ENCOUNTER — Encounter: Payer: Self-pay | Admitting: Gastroenterology

## 2023-05-02 ENCOUNTER — Encounter: Payer: Self-pay | Admitting: Gastroenterology

## 2023-05-02 ENCOUNTER — Ambulatory Visit (HOSPITAL_COMMUNITY)
Admission: RE | Admit: 2023-05-02 | Discharge: 2023-05-02 | Disposition: A | Discharge status: Home / Self Care | Payer: BC Managed Care – PPO | Source: Ambulatory Visit | Attending: Gastroenterology | Admitting: Gastroenterology

## 2023-05-02 DIAGNOSIS — K7460 Unspecified cirrhosis of liver: Secondary | ICD-10-CM | POA: Insufficient documentation

## 2023-05-06 ENCOUNTER — Encounter: Payer: Self-pay | Admitting: Gastroenterology

## 2023-05-06 NOTE — Telephone Encounter (Signed)
Patient had RUQ U/S in January.  MyChart message to patient to go for labs

## 2023-05-06 NOTE — Telephone Encounter (Signed)
-----   Message from [facility] [place] J sent at 11/08/2022 12:13 PM EDT ----- Vanessa Kick can you please place a recall for CBC, CMET, INR, AFP in 6 months?  Due in Feb 2025.

## 2023-05-08 ENCOUNTER — Other Ambulatory Visit (INDEPENDENT_AMBULATORY_CARE_PROVIDER_SITE_OTHER): Payer: BC Managed Care – PPO

## 2023-05-08 ENCOUNTER — Encounter: Payer: Self-pay | Admitting: Gastroenterology

## 2023-05-08 DIAGNOSIS — Z8619 Personal history of other infectious and parasitic diseases: Secondary | ICD-10-CM

## 2023-05-08 DIAGNOSIS — K7460 Unspecified cirrhosis of liver: Secondary | ICD-10-CM | POA: Diagnosis not present

## 2023-05-08 DIAGNOSIS — R5383 Other fatigue: Secondary | ICD-10-CM | POA: Diagnosis not present

## 2023-05-08 LAB — CBC WITH DIFFERENTIAL/PLATELET
Basophils Absolute: 0 10*3/uL (ref 0.0–0.1)
Basophils Relative: 0.3 % (ref 0.0–3.0)
Eosinophils Absolute: 0.1 10*3/uL (ref 0.0–0.7)
Eosinophils Relative: 1.9 % (ref 0.0–5.0)
HCT: 40.8 % (ref 36.0–46.0)
Hemoglobin: 14.1 g/dL (ref 12.0–15.0)
Lymphocytes Relative: 46 % (ref 12.0–46.0)
Lymphs Abs: 1.8 10*3/uL (ref 0.7–4.0)
MCHC: 34.6 g/dL (ref 30.0–36.0)
MCV: 86.6 fL (ref 78.0–100.0)
Monocytes Absolute: 0.4 10*3/uL (ref 0.1–1.0)
Monocytes Relative: 10 % (ref 3.0–12.0)
Neutro Abs: 1.7 10*3/uL (ref 1.4–7.7)
Neutrophils Relative %: 41.8 % — ABNORMAL LOW (ref 43.0–77.0)
Platelets: 201 10*3/uL (ref 150.0–400.0)
RBC: 4.71 Mil/uL (ref 3.87–5.11)
RDW: 13.6 % (ref 11.5–15.5)
WBC: 4 10*3/uL (ref 4.0–10.5)

## 2023-05-08 LAB — PROTIME-INR
INR: 1.2 {ratio} — ABNORMAL HIGH (ref 0.8–1.0)
Prothrombin Time: 12.7 s (ref 9.6–13.1)

## 2023-05-08 LAB — COMPREHENSIVE METABOLIC PANEL
ALT: 14 U/L (ref 0–35)
AST: 22 U/L (ref 0–37)
Albumin: 4.3 g/dL (ref 3.5–5.2)
Alkaline Phosphatase: 104 U/L (ref 39–117)
BUN: 12 mg/dL (ref 6–23)
CO2: 26 meq/L (ref 19–32)
Calcium: 8.9 mg/dL (ref 8.4–10.5)
Chloride: 107 meq/L (ref 96–112)
Creatinine, Ser: 0.98 mg/dL (ref 0.40–1.20)
GFR: 61 mL/min (ref >60.00)
Glucose, Bld: 101 mg/dL — ABNORMAL HIGH (ref 70–99)
Potassium: 3.2 meq/L — ABNORMAL LOW (ref 3.5–5.1)
Sodium: 141 meq/L (ref 135–145)
Total Bilirubin: 1 mg/dL (ref 0.2–1.2)
Total Protein: 7.3 g/dL (ref 6.0–8.3)

## 2023-05-09 LAB — AFP TUMOR MARKER: AFP-Tumor Marker: 5 ng/mL

## 2023-05-15 ENCOUNTER — Encounter: Payer: Self-pay | Admitting: Gastroenterology

## 2023-05-15 ENCOUNTER — Ambulatory Visit (AMBULATORY_SURGERY_CENTER): Payer: BC Managed Care – PPO | Admitting: Gastroenterology

## 2023-05-15 VITALS — BP 144/94 | HR 70 | Temp 97.9°F | Resp 13 | Ht 62.0 in | Wt 156.0 lb

## 2023-05-15 DIAGNOSIS — K639 Disease of intestine, unspecified: Secondary | ICD-10-CM

## 2023-05-15 DIAGNOSIS — Z9889 Other specified postprocedural states: Secondary | ICD-10-CM

## 2023-05-15 DIAGNOSIS — D123 Benign neoplasm of transverse colon: Secondary | ICD-10-CM

## 2023-05-15 DIAGNOSIS — Z860100 Personal history of colon polyps, unspecified: Secondary | ICD-10-CM | POA: Diagnosis not present

## 2023-05-15 DIAGNOSIS — K6289 Other specified diseases of anus and rectum: Secondary | ICD-10-CM

## 2023-05-15 DIAGNOSIS — K6389 Other specified diseases of intestine: Secondary | ICD-10-CM | POA: Diagnosis not present

## 2023-05-15 DIAGNOSIS — Z1211 Encounter for screening for malignant neoplasm of colon: Secondary | ICD-10-CM

## 2023-05-15 MED ORDER — SODIUM CHLORIDE 0.9 % IV SOLN
500.0000 mL | INTRAVENOUS | Status: DC
Start: 2023-05-15 — End: 2023-05-15

## 2023-05-15 NOTE — Progress Notes (Signed)
Vss nad trans to pacu

## 2023-05-15 NOTE — Progress Notes (Signed)
[facility] History and Physical   Primary Care Physician:  Olegario Messier, MD   Reason for Procedure:   History of colon polyps, colon nodule  Plan:    colonoscopy     HPI: Stacy Thomas is a 63 y.o. female  here for colonoscopy surveillance - history of polyps in the past as well as subepithelial colonic lesion in the right colon which we have surveyed over time and stable. Last exam 04/2020.    Patient denies any bowel symptoms at this time. No family history of colon cancer known. Otherwise feels well without any cardiopulmonary symptoms.   I have discussed risks / benefits of anesthesia and endoscopic procedure with Raynelle Bring and they wish to proceed with the exams as outlined today.    Past Medical History:  Diagnosis Date   Bilateral arm pain    chronic, due to use of crutches   Dependence on crutches    left leg prosthesis   Drug addiction in remission (HCC)    since 2007  (crack cocaine,  IV drug use)   Hair loss 02/23/2021   Hand weakness    Hepatic cirrhosis (HCC) 2018   followed by dr Adela Lank (gi) due to hx chronic hepatitis c   Hiatal hernia    History of esophageal stricture    post dilation x2   History of hepatitis C    pt completed harvoni treatment 2017, cured;  genotype 1a with fibrosis 3/4,  previously seen by dr comer   History of osteosarcoma    1982  left knee---  s/p  AKA LLE--   per pt no recurrence   History of recreational drug use multiple rehab visits   IV drug use for 1 yr and Smoked crack for 30+yrs--  per pt clean since 2007   Hyperlipidemia    Hypertension    Idiopathic peripheral neuropathy    Malaise and fatigue 12/05/2021   Memory changes 03/01/2020   OSA (obstructive sleep apnea)    study in epic 10-27-2018 by dr Frances Furbish,  moderate osa,  pt stated used cpap until approx. 07/ 2021, intolerate of mask   Osteoarthritis    Pancreatic cyst    followed by dr Adela Lank--- per office note bening and stable    Prolapsed internal hemorrhoids, grade 4 06/14/2015   Sigmoid diverticulosis    mild   Wears glasses     Past Surgical History:  Procedure Laterality Date   ABOVE KNEE LEG AMPUTATION Left 1982   osteosarcoma   BREAST BIOPSY Left 01/2021   x2   CARPAL TUNNEL RELEASE Right 10/18/2021   Procedure: RIGHT CARPAL TUNNEL RELEASE;  Surgeon: Tarry Kos, MD;  Location: [facility];  Service: Orthopedics;  Laterality: Right;   COLONOSCOPY WITH PROPOFOL  last one 04-28-2020  dr Adela Lank   ESOPHAGOGASTRODUODENOSCOPY (EGD) WITH PROPOFOL  last one 11-02-2019  dr Adela Lank   EXCISION BREAST BX  2015   CYST   EXCISION OF SKIN TAG N/A 07/14/2020   Procedure: EXCISION OF ANAL PAPILLA;  Surgeon: Romie Levee, MD;  Location: [facility];  Service: General;  Laterality: N/A;   STERIOD INJECTION Left 10/18/2021   Procedure: LEFT CARPAL TUNNEL INJECTION;  Surgeon: Tarry Kos, MD;  Location: [facility];  Service: Orthopedics;  Laterality: Left;   TONSILLECTOMY  as child   TOTAL KNEE ARTHROPLASTY Right 02/07/2022   Procedure: RIGHT TOTAL KNEE ARTHROPLASTY;  Surgeon: Nadara Mustard, MD;  Location: [facility];  Service: Orthopedics;  Laterality: Right;   TRANSANAL HEMORRHOIDAL DEARTERIALIZATION N/A 10/27/2015   Procedure: TRANSANAL HEMORRHOIDAL DEARTERIALIZATION;  Surgeon: Karie Soda, MD;  Location: [facility];  Service: General;  Laterality: N/A;   UPPER GASTROINTESTINAL ENDOSCOPY      Prior to Admission medications   Medication Sig Start Date End Date Taking? Authorizing Provider  atorvastatin (LIPITOR) 40 MG tablet Take 1 tablet by mouth once daily 12/05/22  Yes Masters, Katie, DO  hydrochlorothiazide (MICROZIDE) 12.5 MG capsule Take 1 capsule (12.5 mg total) by mouth daily. 01/22/23  Yes Nooruddin, Jason Fila, MD  lisinopril (ZESTRIL) 20 MG tablet Take 1 tablet (20 mg total) by mouth daily. 01/22/23 05/15/23 Yes Nooruddin, Jason Fila, MD    Current  Outpatient Medications  Medication Sig Dispense Refill   atorvastatin (LIPITOR) 40 MG tablet Take 1 tablet by mouth once daily 90 tablet 3   hydrochlorothiazide (MICROZIDE) 12.5 MG capsule Take 1 capsule (12.5 mg total) by mouth daily. 90 capsule 3   lisinopril (ZESTRIL) 20 MG tablet Take 1 tablet (20 mg total) by mouth daily. 90 tablet 3   Current Facility-Administered Medications  Medication Dose Route Frequency Provider Last Rate Last Admin   0.9 %  sodium chloride infusion  500 mL Intravenous Continuous Antwione Picotte, Willaim Rayas, MD        Allergies as of 05/15/2023 - Review Complete 05/15/2023  Allergen Reaction Noted   Morphine Swelling 02/14/2009   Codeine Other (See Comments) 04/17/2011    Family History  Problem Relation Age of Onset   COPD Father    Alcohol abuse Father        Deceased   Hypertension Mother        Living   Healthy Daughter    Healthy Son    Anesthesia problems Neg Hx    Breast cancer Neg Hx    Colon cancer Neg Hx    Esophageal cancer Neg Hx    Rectal cancer Neg Hx    Stomach cancer Neg Hx    Colon polyps Neg Hx     Social History   Socioeconomic History   Marital status: Married    Spouse name: Not on file   Number of children: Not on file   Years of education: Not on file   Highest education level: Not on file  Occupational History   Not on file  Tobacco Use   Smoking status: Former    Current packs/day: 0.00    Average packs/day: 1 pack/day for 41.7 years (41.7 ttl pk-yrs)    Types: Cigarettes    Start date: 04/02/1973    Quit date: 12/19/2014    Years since quitting: 8.4   Smokeless tobacco: Never  Vaping Use   Vaping status: Never Used  Substance and Sexual Activity   Alcohol use: Never    Alcohol/week: 0.0 standard drinks of alcohol   Drug use: Not Currently    Types: "Crack" cocaine    Comment: recovery addict since 2007   Sexual activity: Not on file  Other Topics Concern   Not on file  Social History Narrative   Lives with  husband in a one story home.  Has 3 children.  Does not work.    Education: some college.   Social Drivers of Corporate investment banker Strain: Not on file  Food Insecurity: No Food Insecurity (02/08/2022)   Hunger Vital Sign    Worried About Running Out of Food in the Last Year: Never true    Ran Out  of Food in the Last Year: Never true  Transportation Needs: No Transportation Needs (02/08/2022)   PRAPARE - Administrator, Civil Service (Medical): No    Lack of Transportation (Non-Medical): No  Physical Activity: Not on file  Stress: Not on file  Social Connections: Not on file  Intimate Partner Violence: Not At Risk (02/08/2022)   Humiliation, Afraid, Rape, and Kick questionnaire    Fear of Current or Ex-Partner: No    Emotionally Abused: No    Physically Abused: No    Sexually Abused: No    Review of Systems: All other review of systems negative except as mentioned in the HPI.  Physical Exam: Vital signs BP (!) 157/96   Pulse 70   Temp 97.9 F (36.6 C) (Temporal)   Ht 5\' 2"  (1.575 m)   Wt 156 lb (70.8 kg)   LMP 12/02/2010   SpO2 97%   BMI 28.53 kg/m   General:   Alert,  Well-developed, pleasant and cooperative in NAD Lungs:  Clear throughout to auscultation.   Heart:  Regular rate and rhythm Abdomen:  Soft, nontender and nondistended.   Neuro/Psych:  Alert and cooperative. Normal mood and affect. A and O x 3  Harlin Rain, MD [facility]

## 2023-05-15 NOTE — Op Note (Signed)
[facility] Patient Name: Stacy Thomas Procedure Date: 05/15/2023 11:46 AM MRN: [id number] Endoscopist: Viviann Spare P. Adela Lank , MD, [id number] Age: 63 Referring MD:  Date of Birth: [birth year] Gender: Female Account #: [id number] Procedure:                Colonoscopy Indications:              High risk colon cancer surveillance: Personal                            history of colonic polyps - last exam 04/2020, also                            with history of subepithelial colonic nodule in                            ascending colon which has been monitored over time,                            history of AIN previously had surgery with                            colorectal surgery. Medicines:                Monitored Anesthesia Care Procedure:                Pre-Anesthesia Assessment:                           - Prior to the procedure, a History and Physical                            was performed, and patient medications and                            allergies were reviewed. The patient's tolerance of                            previous anesthesia was also reviewed. The risks                            and benefits of the procedure and the sedation                            options and risks were discussed with the patient.                            All questions were answered, and informed consent                            was obtained. Prior Anticoagulants: The patient has                            taken no anticoagulant or antiplatelet agents. ASA  Grade Assessment: III - A patient with severe                            systemic disease. After reviewing the risks and                            benefits, the patient was deemed in satisfactory                            condition to undergo the procedure.                           After obtaining informed consent, the colonoscope                            was passed under direct vision.  Throughout the                            procedure, the patient's blood pressure, pulse, and                            oxygen saturations were monitored continuously. The                            Olympus Scope [id number] was introduced through the                            anus and advanced to the the cecum, identified by                            appendiceal orifice and ileocecal valve. The                            colonoscopy was performed without difficulty. The                            patient tolerated the procedure well. The quality                            of the bowel preparation was good. The ileocecal                            valve, appendiceal orifice, and rectum were                            photographed. Scope In: 11:56:17 AM Scope Out: 12:15:18 PM Scope Withdrawal Time: 0 hours 15 minutes 23 seconds  Total Procedure Duration: 0 hours 19 minutes 1 second  Findings:                 The perianal and digital rectal examinations were                            normal.  One 5 to 6 mm subepithelial nodule was found in the                            distal ascending colon near a tattoo. The lesion is                            similar in appearance to prior exams and seems                            stable over time. Bite on bite biopsies were taken                            with a cold forceps for histology.                           A 3 to 4 mm polyp was found in the transverse                            colon. The polyp was flat. The polyp was removed                            with a cold snare. Resection and retrieval were                            complete.                           Anal papilla(e) were hypertrophied vs. prolapsed                            skin tag.                           The exam was otherwise without abnormality. Complications:            No immediate complications. Estimated blood loss:                             Minimal. Estimated Blood Loss:     Estimated blood loss was minimal. Impression:               - Subepithelial nodule in the distal ascending                            colon. Stable in appearance over time. Biopsied.                           - One 3 to 4 mm polyp in the transverse colon,                            removed with a cold snare. Resected and retrieved.                           - Anal papilla(e) were hypertrophied.                           -  The examination was otherwise normal. Recommendation:           - Patient has a contact number available for                            emergencies. The signs and symptoms of potential                            delayed complications were discussed with the                            patient. Return to normal activities tomorrow.                            Written discharge instructions were provided to the                            patient.                           - Resume previous diet.                           - Continue present medications.                           - Await pathology results.                           - Follow up with colorectal surgery (Dr. Maisie Fus)                            for history of AIN and findings on this exam. Viviann Spare P. Kimbely Whiteaker, MD 05/15/2023 12:23:05 PM This report has been signed electronically.

## 2023-05-15 NOTE — Progress Notes (Signed)
Pt's states no medical or surgical changes since previsit or office visit.

## 2023-05-15 NOTE — Progress Notes (Signed)
Called to room to assist during endoscopic procedure.  Patient ID and intended procedure confirmed with present staff. Received instructions for my participation in the procedure from the performing physician.

## 2023-05-15 NOTE — Patient Instructions (Signed)
DISCHARGE INSTRUCTIONS GIVEN. HANDOUT ON POLYPS. RESUME PREVIOUS MEDICATIONS. FOLLOW UP WITH COLORECTAL SURGERY Dr. Maisie Fus for history of AIN and findings on this exam. YOU HAD AN ENDOSCOPIC PROCEDURE TODAY AT THE [facility]:   Refer to the procedure report that was given to you for any specific questions about what was found during the examination.  If the procedure report does not answer your questions, please call your gastroenterologist to clarify.  If you requested that your care partner not be given the details of your procedure findings, then the procedure report has been included in a sealed envelope for you to review at your convenience later.  YOU SHOULD EXPECT: Some feelings of bloating in the abdomen. Passage of more gas than usual.  Walking can help get rid of the air that was put into your GI tract during the procedure and reduce the bloating. If you had a lower endoscopy (such as a colonoscopy or flexible sigmoidoscopy) you may notice spotting of blood in your stool or on the toilet paper. If you underwent a bowel prep for your procedure, you may not have a normal bowel movement for a few days.  Please Note:  You might notice some irritation and congestion in your nose or some drainage.  This is from the oxygen used during your procedure.  There is no need for concern and it should clear up in a day or so.  SYMPTOMS TO REPORT IMMEDIATELY:  Following lower endoscopy (colonoscopy or flexible sigmoidoscopy):  Excessive amounts of blood in the stool  Significant tenderness or worsening of abdominal pains  Swelling of the abdomen that is new, acute  Fever of 100F or higher   For urgent or emergent issues, a gastroenterologist can be reached at any hour by calling [phone]. Do not use MyChart messaging for urgent concerns.    DIET:  We do recommend a small meal at first, but then you may proceed to your regular diet.  Drink plenty of fluids but you should avoid  alcoholic beverages for 24 hours.  ACTIVITY:  You should plan to take it easy for the rest of today and you should NOT DRIVE or use heavy machinery until tomorrow (because of the sedation medicines used during the test).    FOLLOW UP: Our staff will call the number listed on your records the next business day following your procedure.  We will call around 7:15- 8:00 am to check on you and address any questions or concerns that you may have regarding the information given to you following your procedure. If we do not reach you, we will leave a message.     If any biopsies were taken you will be contacted by phone or by letter within the next 1-3 weeks.  Please call us at [phone] if you have not heard about the biopsies in 3 weeks.    SIGNATURES/CONFIDENTIALITY: You and/or your care partner have signed paperwork which will be entered into your electronic medical record.  These signatures attest to the fact that that the information above on your After Visit Summary has been reviewed and is understood.  Full responsibility of the confidentiality of this discharge information lies with you and/or your care-partner.

## 2023-05-16 ENCOUNTER — Telehealth: Payer: Self-pay

## 2023-05-16 NOTE — Telephone Encounter (Signed)
  Follow up Call-     05/15/2023   11:10 AM  Call back number  Post procedure Call Back phone  # [phone]  Permission to leave phone message Yes     Patient questions:  Do you have a fever, pain , or abdominal swelling? No. Pain Score  0 *  Have you tolerated food without any problems? Yes.    Have you been able to return to your normal activities? Yes.    Do you have any questions about your discharge instructions: Diet   No. Medications  No. Follow up visit  No.  Do you have questions or concerns about your Care? No.  Actions: * If pain score is 4 or above: No action needed, pain <4.

## 2023-05-21 LAB — SURGICAL PATHOLOGY

## 2023-05-22 ENCOUNTER — Encounter: Payer: Self-pay | Admitting: Gastroenterology

## 2023-05-27 ENCOUNTER — Telehealth: Payer: Self-pay

## 2023-05-27 NOTE — Telephone Encounter (Signed)
 Per 2/12 procedure - Follow up with colorectal surgery ( Dr. Maisie Fus) for history of AIN and findings on this exam - anal papillae  Referral, records, demographic and insurance information faxed to [facility] at [phone].

## 2023-05-27 NOTE — Telephone Encounter (Signed)
 Returned call to patient. We reviewed pathology results and recommendations. Pt wanted [place] to refer her back to Dr. Maisie Fus. Pt knows to expect a call from [facility] to set up appt. Pt verbalized understanding and had no concerns at the end of the call

## 2023-05-27 NOTE — Telephone Encounter (Signed)
 Inbound call from patient requesting a call to discuss recent results further. Please advise, thank you.

## 2023-07-01 ENCOUNTER — Ambulatory Visit: Payer: Self-pay | Admitting: General Surgery

## 2023-07-01 DIAGNOSIS — K642 Third degree hemorrhoids: Secondary | ICD-10-CM | POA: Diagnosis not present

## 2023-07-01 NOTE — H&P (Signed)
 REFERRING PHYSICIAN:  Ruffin Frederick*  PROVIDER:  Elenora Gamma, MD  MRN: [id number] DOB: [birth year] DATE OF ENCOUNTER: 07/01/2023  Subjective   Chief Complaint: New Consultation     History of Present Illness: Stacy Thomas is a 63 y.o. female who is seen today as an office consultation at the request of Dr. Adela Lank for evaluation of New Consultation .  Patient underwent a colonoscopy in February 2025.  This showed a hypertrophied mass in the anal canal.  Patient has a history of AIN grade 1 and a posterior midline anal papilla.  This was excised by me in the operating room in April 2022.  Pathology showed no dysplasia or malignancy.  She is also status post THD in 2017 by Dr. Michaell Cowing.  Pt states she has trouble with daily irritation and occasional swelling.     Review of Systems: A complete review of systems was obtained from the patient.  I have reviewed this information and discussed as appropriate with the patient.  See HPI as well for other ROS.   Medical History: Past Medical History:  Diagnosis Date   Arthritis    GERD (gastroesophageal reflux disease)    Hyperlipidemia    Hypertension    Liver disease    Osteosarcoma (CMS/HHS-HCC)     Patient Active Problem List  Diagnosis   Chronic hepatitis C (CMS/HHS-HCC)   CKD (chronic kidney disease)   Hepatic cirrhosis (CMS/HHS-HCC)   Hx of AKA (above knee amputation), left (CMS/HHS-HCC)   Hx of osteosarcoma    Past Surgical History:  Procedure Laterality Date   ABOVE KNEE LEG AMPUTATION Left      Allergies  Allergen Reactions   Morphine Swelling    REACTION: face swells   Codeine Other (See Comments)    Avoids due to being recovery addict    Current Outpatient Medications on File Prior to Visit  Medication Sig Dispense Refill   atorvastatin (LIPITOR) 40 MG tablet Take 40 mg by mouth once daily     hydroCHLOROthiazide (MICROZIDE) 12.5 mg capsule Take 12.5 mg by mouth once daily      lisinopriL (ZESTRIL) 20 MG tablet Take 20 mg by mouth once daily     No current facility-administered medications on file prior to visit.    History reviewed. No pertinent family history.   Social History   Tobacco Use  Smoking Status Never  Smokeless Tobacco Never     Social History   Socioeconomic History   Marital status: Married  Tobacco Use   Smoking status: Never   Smokeless tobacco: Never  Vaping Use   Vaping status: Never Used  Substance and Sexual Activity   Alcohol use: Never   Drug use: Never   Social Drivers of Health   Food Insecurity: No Food Insecurity (02/08/2022)   Received from [facility]   Hunger Vital Sign    Worried About Running Out of Food in the Last Year: Never true    Ran Out of Food in the Last Year: Never true  Transportation Needs: No Transportation Needs (02/08/2022)   Received from [facility]    Lack of Transportation (Medical): No    Lack of Transportation (Non-Medical): No  Housing Stability: Unknown (07/01/2023)   Housing Stability Vital Sign    Homeless in the Last Year: No    Objective:    Vitals:   07/01/23 1058 07/01/23 1059  BP: (!) 168/102   Pulse: 88   Temp: 36.8 C (98.3  F)   SpO2: 96%   Weight: 70 kg (154 lb 6.4 oz)   Height: 154.9 cm (5\' 1" )   PainSc:  0-No pain  PainLoc:  Rectum     Exam Gen: NAD Abd: soft Rectal: Large left-sided prolapsing hemorrhoid, good rectal tone.   Labs, Imaging and Diagnostic Testing:  Procedure: Anoscopy Surgeon: Maisie Fus After the risks and benefits were explained, written consent was obtained for above procedure.  A medical assistant chaperone was present thoroughout the entire procedure.  Anesthesia: none Diagnosis: anal irritation Findings: Left lateral grade 3 prolapsing hemorrhoid with mild irritation.   Assessment and Plan:  Diagnoses and all orders for this visit:  Prolapsed internal hemorrhoids, grade 41     63 year old female with  a small grade 3 internal hemorrhoid and a large external hemorrhoid skin tag.  We discussed that the best way to relieve her symptoms from this would be hemorrhoidectomy, single column.  We discussed the typical postoperative pain and bleeding associated with this surgery as well as recovery times.  All questions were answered.  Patient would like to proceed with surgery.  Vanita Panda, MD Colon and Rectal Surgery [facility]

## 2023-07-17 ENCOUNTER — Encounter: Payer: Self-pay | Admitting: Student

## 2023-07-17 NOTE — Progress Notes (Deleted)
   Established Patient Office Visit  Subjective   Patient ID: Stacy Thomas, female    DOB: 05-23-1960  Age: 63 y.o. MRN: [id number]  No chief complaint on file.   Stacy Thomas is a 63 y.o. who presents to the clinic for ***. Please see problem based assessment and plan for additional details.  Patient presents with a history of hypertension with a blood pressure today of***. Their hypertension is***controlled on a regimen of***,***,***.  Prior BMP in***was***.  Patient***symptoms of hypotension at home.  They***check their blood pressure at home with an average of***. Plan: -***Continue current regimen of: -BMP today : - Continue lisinopril 20 mg - Start hydrochlorothiazide 12.5 mg   Hepatic cirrhosis (HCC) Last saw GI: [place]: AFP:  Prediabetes: last A1c was 2023, 5.8: recheck A1c    Patient Active Problem List   Diagnosis Date Noted   Trigger finger, right ring finger 10/10/2022   Shoulder pain, left 10/10/2022   Total knee replacement status, right 02/07/2022   Prediabetes 02/23/2021   CKD (chronic kidney disease) 09/11/2019   HLD (hyperlipidemia) 09/09/2018   Allergic sinusitis 12/03/2017   Hx of AKA (above knee amputation), left (HCC) 02/18/2017   Unilateral primary osteoarthritis, right knee 02/18/2017   Vitamin D deficiency 05/04/2016   Bilateral primary osteoarthritis of hip 04/26/2016   Bilateral carpal tunnel syndrome 03/29/2016   Screening for cervical cancer 03/06/2016   PVD (peripheral vascular disease) (HCC) 03/05/2016   Pancreatic abnormality 03/05/2016   Hypertension 03/05/2016   Hepatic cirrhosis (HCC) 11/15/2015   Chronic hepatitis C (HCC) 06/15/2015   Hx of osteosarcoma 06/14/2015       Objective:     LMP 12/02/2010  BP Readings from Last 3 Encounters:  05/15/23 (!) 144/94  01/22/23 (!) 157/106  10/09/22 (!) 154/101   Wt Readings from Last 3 Encounters:  05/15/23 156 lb (70.8 kg)  04/18/23 156 lb (70.8 kg)  01/22/23 156 lb 4.8 oz (70.9  kg)      Physical Exam   No results found for any visits on 07/17/23.  Last metabolic panel Lab Results  Component Value Date   GLUCOSE 101 (H) 05/08/2023   NA 141 05/08/2023   K 3.2 (L) 05/08/2023   CL 107 05/08/2023   CO2 26 05/08/2023   BUN 12 05/08/2023   CREATININE 0.98 05/08/2023   GFR 61.00 05/08/2023   CALCIUM 8.9 05/08/2023   PROT 7.3 05/08/2023   ALBUMIN 4.3 05/08/2023   LABGLOB 2.4 12/05/2021   AGRATIO 1.9 12/05/2021   BILITOT 1.0 05/08/2023   ALKPHOS 104 05/08/2023   AST 22 05/08/2023   ALT 14 05/08/2023   ANIONGAP 8 01/29/2022   Last lipids Lab Results  Component Value Date   CHOL 209 (H) 09/08/2018   HDL 40 09/08/2018   LDLCALC 140 (H) 09/08/2018   TRIG 145 09/08/2018   CHOLHDL 5.2 (H) 09/08/2018   Last hemoglobin A1c Lab Results  Component Value Date   HGBA1C 5.8 (H) 12/05/2021      The ASCVD Risk score (Arnett DK, et al., 2019) failed to calculate for the following reasons:   Cannot find a previous HDL lab   Cannot find a previous total cholesterol lab    Assessment & Plan:   Problem List Items Addressed This Visit   None   No follow-ups on file.    Faith Rogue, DO

## 2023-07-18 ENCOUNTER — Ambulatory Visit: Admitting: Orthopedic Surgery

## 2023-07-18 DIAGNOSIS — R52 Pain, unspecified: Secondary | ICD-10-CM

## 2023-07-18 DIAGNOSIS — M25512 Pain in left shoulder: Secondary | ICD-10-CM | POA: Diagnosis not present

## 2023-07-18 MED ORDER — DULOXETINE HCL 30 MG PO CPEP: 30.0000 mg | ORAL_CAPSULE | Freq: Every day | ORAL | 0 refills | Status: DC

## 2023-07-18 MED ORDER — AMITRIPTYLINE HCL 25 MG PO TABS: 25.0000 mg | ORAL_TABLET | Freq: Every day | ORAL | 3 refills | Status: DC

## 2023-07-23 ENCOUNTER — Encounter: Payer: Self-pay | Admitting: Orthopedic Surgery

## 2023-07-23 NOTE — Progress Notes (Signed)
 Office Visit Note   Patient: Stacy Thomas           Date of Birth: 11-Sep-1960           MRN: [id number] Visit Date: 07/18/2023              Requested by: Nooruddin, Saad, MD [address] [place],  [place] [address] PCP: Nooruddin, Saad, MD  No chief complaint on file.     HPI: Patient is a 63 year old woman who presents stating that her whole body hurts.  She states she has trouble sleeping.  She is off her statin she states she is currently on an ACE and hydrochlorothiazide .  Patient states that both shoulders arms neck hurt from using crutches.  Patient denies any radicular pain in her legs.  Patient states that she has used prednisone  and Neurontin  in the past and has not interested in using either of these medicines.  Assessment & Plan: Visit Diagnoses:  1. Acute pain of left shoulder   2. Whole body pain     Plan: A prescription was called in for Elavil  to help with sleeping at night and Cymbalta  to help with her generalized pain.  Follow-Up Instructions: No follow-ups on file.   Ortho Exam  Patient is alert, oriented, no adenopathy, well-dressed, normal affect, normal respiratory effort. Examination patient has full range of motion of both upper extremities without motor weakness.  Both lower extremities have a negative straight leg raise without motor weakness.  Imaging: No results found. No images are attached to the encounter.  Labs: Lab Results  Component Value Date   HGBA1C 5.8 (H) 12/05/2021   HGBA1C 5.9 (H) 02/20/2021   HGBA1C 5.9 (H) 02/29/2020     Lab Results  Component Value Date   ALBUMIN 4.3 05/08/2023   ALBUMIN 4.5 11/07/2022   ALBUMIN 4.4 05/07/2022    No results found for: "MG" Lab Results  Component Value Date   VD25OH 15.8 (L) 05/03/2016    No results found for: "PREALBUMIN"    Latest Ref Rng & Units 05/08/2023    8:20 AM 11/07/2022    1:15 PM 05/07/2022    1:16 PM  CBC EXTENDED  WBC 4.0 - 10.5 K/uL 4.0  4.9  4.4   RBC 3.87 - 5.11  Mil/uL 4.71  4.97  4.69   Hemoglobin 12.0 - 15.0 g/dL 19.1  47.8  29.5   HCT 36.0 - 46.0 % 40.8  43.7  40.3   Platelets 150.0 - 400.0 K/uL 201.0  242.0  239.0   NEUT# 1.4 - 7.7 K/uL 1.7  2.4  1.8   Lymph# 0.7 - 4.0 K/uL 1.8  2.0  2.1      There is no height or weight on file to calculate BMI.  Orders:  No orders of the defined types were placed in this encounter.  Meds ordered this encounter  Medications   amitriptyline  (ELAVIL ) 25 MG tablet    Sig: Take 1 tablet (25 mg total) by mouth at bedtime.    Dispense:  30 tablet    Refill:  3   DULoxetine  (CYMBALTA ) 30 MG capsule    Sig: Take 1 capsule (30 mg total) by mouth daily. Qam    Dispense:  30 capsule    Refill:  0     Procedures: No procedures performed  Clinical Data: No additional findings.  ROS:  All other systems negative, except as noted in the HPI. Review of Systems  Objective: Vital Signs: LMP  12/02/2010   Specialty Comments:  No specialty comments available.  PMFS History: Patient Active Problem List   Diagnosis Date Noted   Trigger finger, right ring finger 10/10/2022   Shoulder pain, left 10/10/2022   Total knee replacement status, right 02/07/2022   Prediabetes 02/23/2021   CKD (chronic kidney disease) 09/11/2019   HLD (hyperlipidemia) 09/09/2018   Allergic sinusitis 12/03/2017   Hx of AKA (above knee amputation), left (HCC) 02/18/2017   Unilateral primary osteoarthritis, right knee 02/18/2017   Vitamin D  deficiency 05/04/2016   Bilateral primary osteoarthritis of hip 04/26/2016   Bilateral carpal tunnel syndrome 03/29/2016   Screening for cervical cancer 03/06/2016   PVD (peripheral vascular disease) (HCC) 03/05/2016   Pancreatic abnormality 03/05/2016   Hypertension 03/05/2016   Hepatic cirrhosis (HCC) 11/15/2015   Chronic hepatitis C (HCC) 06/15/2015   Hx of osteosarcoma 06/14/2015   Past Medical History:  Diagnosis Date   Bilateral arm pain    chronic, due to use of crutches    Dependence on crutches    left leg prosthesis   Drug addiction in remission (HCC)    since 2007  (crack cocaine,  IV drug use)   Hair loss 02/23/2021   Hand weakness    Hepatic cirrhosis (HCC) 2018   followed by dr General Kenner (gi) due to hx chronic hepatitis c   Hiatal hernia    History of esophageal stricture    post dilation x2   History of hepatitis C    pt completed harvoni  treatment 2017, cured;  genotype 1a with fibrosis 3/4,  previously seen by dr comer   History of osteosarcoma    1982  left knee---  s/p  AKA LLE--   per pt no recurrence   History of recreational drug use multiple rehab visits   IV drug use for 1 yr and Smoked crack for 30+yrs--  per pt clean since 2007   Hyperlipidemia    Hypertension    Idiopathic peripheral neuropathy    Malaise and fatigue 12/05/2021   Memory changes 03/01/2020   OSA (obstructive sleep apnea)    study in epic 10-27-2018 by dr Omar Bibber,  moderate osa,  pt stated used cpap until approx. 07/ 2021, intolerate of mask   Osteoarthritis    Pancreatic cyst    followed by dr General Kenner--- per office note bening and stable   Prolapsed internal hemorrhoids, grade 4 06/14/2015   Sigmoid diverticulosis    mild   Wears glasses     Family History  Problem Relation Age of Onset   COPD Father    Alcohol abuse Father        Deceased   Hypertension Mother        Living   Healthy Daughter    Healthy Son    Anesthesia problems Neg Hx    Breast cancer Neg Hx    Colon cancer Neg Hx    Esophageal cancer Neg Hx    Rectal cancer Neg Hx    Stomach cancer Neg Hx    Colon polyps Neg Hx     Past Surgical History:  Procedure Laterality Date   ABOVE KNEE LEG AMPUTATION Left 1982   osteosarcoma   BREAST BIOPSY Left 01/2021   x2   CARPAL TUNNEL RELEASE Right 10/18/2021   Procedure: RIGHT CARPAL TUNNEL RELEASE;  Surgeon: Wes Hamman, MD;  Location: [facility];  Service: Orthopedics;  Laterality: Right;   COLONOSCOPY WITH PROPOFOL   last  one 04-28-2020  dr armbruster  ESOPHAGOGASTRODUODENOSCOPY (EGD) WITH PROPOFOL   last one 11-02-2019  dr General Kenner   EXCISION BREAST BX  2015   CYST   EXCISION OF SKIN TAG N/A 07/14/2020   Procedure: EXCISION OF ANAL PAPILLA;  Surgeon: Joyce Nixon, MD;  Location: [facility];  Service: General;  Laterality: N/A;   STERIOD INJECTION Left 10/18/2021   Procedure: LEFT CARPAL TUNNEL INJECTION;  Surgeon: Wes Hamman, MD;  Location: [facility];  Service: Orthopedics;  Laterality: Left;   TONSILLECTOMY  as child   TOTAL KNEE ARTHROPLASTY Right 02/07/2022   Procedure: RIGHT TOTAL KNEE ARTHROPLASTY;  Surgeon: Timothy Ford, MD;  Location: [facility] OR;  Service: Orthopedics;  Laterality: Right;   TRANSANAL HEMORRHOIDAL DEARTERIALIZATION N/A 10/27/2015   Procedure: TRANSANAL HEMORRHOIDAL DEARTERIALIZATION;  Surgeon: Candyce Champagne, MD;  Location: [facility];  Service: General;  Laterality: N/A;   UPPER GASTROINTESTINAL ENDOSCOPY     Social History   Occupational History   Not on file  Tobacco Use   Smoking status: Former    Current packs/day: 0.00    Average packs/day: 1 pack/day for 41.7 years (41.7 ttl pk-yrs)    Types: Cigarettes    Start date: 04/02/1973    Quit date: 12/19/2014    Years since quitting: 8.5   Smokeless tobacco: Never  Vaping Use   Vaping status: Never Used  Substance and Sexual Activity   Alcohol use: Never    Alcohol/week: 0.0 standard drinks of alcohol   Drug use: Not Currently    Types: "Crack" cocaine    Comment: recovery addict since 2007   Sexual activity: Not on file

## 2023-07-29 ENCOUNTER — Telehealth: Payer: Self-pay

## 2023-07-29 DIAGNOSIS — K862 Cyst of pancreas: Secondary | ICD-10-CM

## 2023-07-29 NOTE — Telephone Encounter (Signed)
-----   Message from [facility] sent at 07/19/2022  3:22 PM EDT ----- Regarding: FW: due for MRCP  ----- Message ----- From: Alwyn Baas, CMA Sent: 07/19/2022  11:15 AM EDT To: Alwyn Baas, CMA Subject: due for MRCP                                   Patient will be due for MRCP in May 2025

## 2023-07-29 NOTE — Telephone Encounter (Signed)
 Called and Scheduled patient for MRCP at [facility] on Wednesday, 5-7 at 9:00 am, arrive at 8:30 am NPO 4 hours. Patient has an appointment on Tues, 4-29. Will discuss appointment at that time

## 2023-07-30 ENCOUNTER — Ambulatory Visit: Payer: BC Managed Care – PPO | Admitting: Gastroenterology

## 2023-07-30 ENCOUNTER — Telehealth: Payer: Self-pay

## 2023-07-30 ENCOUNTER — Encounter: Payer: Self-pay | Admitting: Gastroenterology

## 2023-07-30 VITALS — BP 130/90 | HR 87 | Ht 61.0 in | Wt 155.0 lb

## 2023-07-30 DIAGNOSIS — Z8619 Personal history of other infectious and parasitic diseases: Secondary | ICD-10-CM

## 2023-07-30 DIAGNOSIS — K6282 Dysplasia of anus: Secondary | ICD-10-CM | POA: Diagnosis not present

## 2023-07-30 DIAGNOSIS — K862 Cyst of pancreas: Secondary | ICD-10-CM | POA: Diagnosis not present

## 2023-07-30 DIAGNOSIS — K7460 Unspecified cirrhosis of liver: Secondary | ICD-10-CM

## 2023-07-30 NOTE — Telephone Encounter (Signed)
 Referral faxed to Dr. Aretta Beery at [facility] for history of AIN, abnormal colonoscopy findings / hypertrophied anal papillae

## 2023-07-30 NOTE — Progress Notes (Signed)
 HPI :  63 year old female here to follow-up for cirrhosis, pancreatic cyst, AIN/hypertrophied anal papilla:  Recall she has a history of hep C diagnosed years ago, thought to be due to IV drugs. Genotype 1a, treated with Harvoni  for 12 weeks many years ago with eradication.  US  with elastography prior to therapy showing normal liver and spleen, with some F3/F4 fibrotic changes. Imaging since then has showed changes concerning for cirrhosis.   She has had no decompensations of her liver disease over time.  Her last upper endoscopy was in August 2021 and she had no varices.  She is not drinking any alcohol.  Her platelet count has been normal and this past January we performed an ultrasound for [facility] screening which was negative, also had an elastography which showed median K PA of 3.0, arguing against any significant fibrotic change.  She had labs done in February, INR at 1.2, bilirubin at 1.0.  She remains well compensated.   Recall she has also been followed for benign-appearing pancreatic cyst, had a follow-up MRCP in May 2023 which showed stable changes in her pancreas, stable appearing cyst as outlined below.  No weight loss.  Radiology recommended a repeat exam in 2 years for continued surveillance.  She has this scheduled for next month to both evaluate her pancreatic cyst and perform HCC screening.  Recall she had a history of a subepithelial colon nodule in her right colon that has been monitored over time with surveillance colonoscopy.  She has also had a history of AIN status post excisional treatment with Dr. Andy Bannister of [facility] colorectal surgery. She had a colonoscopy with me in February, she has a stable small subepithelial lesion in her colon that has been stable over the years, biopsies nondiagnostic.  She had either hypertrophied anal papilla versus prolapsed skin tag on colonoscopy.  Given location below dentate line I did not biopsy it.  I referred her back to Dr. Andy Bannister who had her  scheduled for surgery to remove it.  The patient told me she canceled it and would like another opinion by another surgical group.  She denies any pain in her rectum or bowel habit changes otherwise.  Otherwise patient has been feeling well without complaints.   Prior workup: EGD 09/10/2016 - 3cm HH, irregular zline, no varices, H pylori gastritis, given Pylera for gastritis -    MRCP 02/11/2017 - changes of cirrhosis noted, stable 1.5cm cystic pancreatic lesion uncinate process without high risk features, subcm LI-RADS category 3 lesion in the left lobe - recommended repeat MRI in 6 months   MRI 08/23/2017 - small left lobe lesion less conspicous, stable cystic lesion in the uncinate process - repeat MRI in 1 year   RUQ US  - 02/25/2018 - 6mm left lobe lesion stable, suspected hemangioma   MRCP 08/22/18 - stable cystic lesion of the pancreas - measuring 11 x 7mm, no change from 02/06/2016 - radiology recommending surveillance in 1 year, mild changes of cirrhosis   US  06/27/16 - "mild cirrhosis", no mass lesions   CT abdomen 02/06/2016 - subtle changes of cirrhosis, indeterminate lesion in pancreas 1.5cm thought to represent cyst.    Colonoscopy 03/23/19 - The perianal and digital rectal examinations were normal. - A diminutive polyp was found in the distal ascending colon. The polyp was sessile. The polyp was removed with a cold biopsy forceps. Resection and retrieval were complete. - Two sessile polyps were found in the distal ascending colon. The polyps were 4 to 6 mm in  size. These polyps were removed with a cold snare. Resection and retrieval were complete. - One roughly 5 mm subepithelial nodule was found in the distal ascending colon. It was hard to palpation with the forceps. Multiple bite on biopsies were taken with a cold forceps for histology. Area just distal to the lesion was tattooed with an injection of Spot (carbon black). - A diminutive polyp was found in the transverse colon.  The polyp was sessile. The polyp was removed with a cold snare. Resection and retrieval were complete. - Internal hemorrhoids were found during retroflexion. - There was evidence of scarring in the rectum from prior hemorrhoidectomy. The colon was tortous. The exam was otherwise without abnormality.   1. Surgical [P], colon, ascending, polyp (3) - TUBULAR ADENOMA(S). - NO HIGH GRADE DYSPLASIA OR MALIGNANCY. 2. Surgical [P], colon, ascending BX - BENIGN COLONIC MUCOSA. - NO INFLAMMATORY CHANGES, ADENOMATOUS CHANGE OR MALIGNANCY.   CT scan 04/10/19 - IMPRESSION: 1. No CT abnormality of the colon. 2. Hepatic steatosis. 3. Aortic Atherosclerosis (ICD10-I70.0). Focal ectasia of the terminal aorta measuring up to 2.1 cm, unchanged compared to prior examination. 4. Coronary artery disease.   MRCP - 08/17/19 - IMPRESSION: 1. Minimal motion degradation. 2. Similar 1.2 cm cystic lesion within the pancreatic uncinate process. Most likely a pseudocyst. Per consensus criteria, this warrants surveillance by annual MRI/MRCP for a total of 5 years. This recommendation follows ACR consensus guidelines: Management of Incidental Pancreatic Cysts: A White Paper of the ACR Incidental Findings Committee. J Am Coll Radiol 2017;14:911-923. 3. Mild cirrhosis. 4.  Aortic Atherosclerosis (ICD10-I70.0).     EGD 11/02/19: - A 2 cm hiatal hernia was present. - The Z-line was irregular with a few diminutive islands of salmon colored mucosa, did not meet criteria for Barrett's biopsies.. - A single small area of benign ectopic gastric mucosa was found in the upper third of the esophagus. - The exam of the esophagus was otherwise normal. No esophageal varices. - The entire examined stomach was normal. No gastric varices - The duodenal bulb and second portion of the duodenum were normal.     Colonoscopy 04/28/20:The perianal and digital rectal examinations were normal. - One 5 to 6 mm subepithelial nodule was  found in the distal ascending colon, proximal to area of prior tattoo. Similar in appearance compared to one year ago. Bite on bite biopsies were taken with a cold forceps for histology. - A few small-mouthed diverticula were found in the sigmoid colon. - A 4 mm polyp was found in the recto-sigmoid colon. The polyp was sessile. The polyp was removed with a cold snare. Resection and retrieval were complete. - Anal papilla(e) was hypertrophied vs. prolapsed hemorrhoid, with some inflammatory changes noted at the base of it. Biopsies were taken with a cold forceps for histology. - Internal hemorrhoids were found during retroflexion. - There was scarring in the rectum, similar in appearance compared to prior exam. Colon was tortous. The exam was otherwise without abnormality.   1. Surgical [P], ascending colon nodule bx's - BENIGN COLONIC MUCOSA - NO ACTIVE INFLAMMATION OR EVIDENCE OF MICROSCOPIC COLITIS - NO HIGH GRADE DYSPLASIA OR MALIGNANCY IDENTIFIED 2. Surgical [P], colon, rectosigmoid, polyp - HYPERPLASTIC POLYP (1 OF 1 FRAGMENTS) - NO HIGH GRADE DYSPLASIA OR MALIGNANCY IDENTIFIED - SEE COMMENT 3. Surgical [P], colon, anal bx's - LOW GRADE SQUAMOUS INTRAEPITHELIAL LESION (AIN 1; LGSIL) - SEE COMMENT   2. Additional levels were obtained to assess for adenomatous features but none were not identified.  3. P16 immunohistochemistry supports the diagnosis of low grade dysplasia. Dr. Judd Northern reviewed the case and agrees with the above diagnosis.   Referred to [facility]   MRCP 08/22/20: IMPRESSION: 1. Stable appearance of 1.2 cm cystic lesion within the uncinate process of the pancreas. This is favored to represent a benign process. Recommend follow-up imaging in 12 months with repeat MRI/MRCP. Per consensus criteria, this warrants surveillance by annual MRI/MRCP for a total of 5 years. This recommendation follows ACR consensus guidelines: Management of incidental Pancreatic Cysts: A White  Paper of the ACR Incidental findings Committee. J Am Coll Radiol 2017;14:911-923. 2. Mild cirrhosis. No suspicious liver lesions.     MRCP 08/01/2021: IMPRESSION: 1. 5.5 years of stability of the 1.2 cm cystic lesion in the uncinate process of the pancreas with questionable thin internal septation. Possibilities include a postinflammatory cystic lesion, small serous cystadenoma, or small intraductal papillary mucinous neoplasm. Taking into account patient age in lesion size follow up pancreatic protocol MRI is recommended in 2 years time. This recommendation follows ACR consensus guidelines: Management of Incidental Pancreatic Cysts: A White Paper of the ACR Incidental Findings Committee. J Am Coll Radiol 2017;14:911-923. 2.  Aortic Atherosclerosis (ICD10-I70.0). 3. Probably early cirrhosis morphology, with some faint foci of arterial phase hyperintensity most notable in the right hepatic lobe measuring up to 1 cm in diameter. This lesion is technically LI-RADS category LR 3 although the patient has had similar small waxing and waning small foci of arterial phase enhancement on prior exams. Taking into account this imaging history , surveillance MRI at no greater than 2 years time is suggested.     RUQ US  04/12/2022: IMPRESSION: Normal study. No evidence of cirrhosis or hepatic mass. No cause for the patient's symptoms identified.        RUQ US  With elastography 04/2023: IMPRESSION: 1. Unremarkable ultrasound of the liver and gallbladder.   ULTRASOUND HEPATIC ELASTOGRAPHY:   Median kPa: 3.0   Diagnostic category:  < or = 5 kPa: high probability of being normal   Colonoscopy 05/15/23: - The perianal and digital rectal examinations were normal. - One 5 to 6 mm subepithelial nodule was found in the distal ascending colon near a tattoo. The lesion is similar in appearance to prior exams and seems stable over time. Bite on bite biopsies were taken with a cold forceps for histology.  - A 3 to 4 mm polyp was found in the transverse colon. The polyp was flat. The polyp was removed with a cold snare. Resection and retrieval were complete. - Anal papilla(e) were hypertrophied vs. prolapsed skin tag. - The exam was otherwise without abnormality.   1. Surgical [P], colon, ascending :       -  COLONIC MUCOSA WITH A PROMINENT LYMPHOID AGGREGATE.       NOTE: THE CLINICAL QUERY OF A CARCINOID IS NOTED.  THERE IS NO MORPHOLOGIC       EVIDENCE OF CARCINOID ON SUPERFICIAL BIOPSIES, MULTIPLE LEVELS EXAMINED.        2. Surgical [P], colon, transverse, polyp (1) :       -  COLONIC MUCOSA WITH MILD SUPERFICIAL HYPERPLASIA AND A PROMINENT LYMPHOID       AGGREGATE, MULTIPLE LEVELS EXAMINED.     Past Medical History:  Diagnosis Date   AIN (anal intraepithelial neoplasia) anal canal    Bilateral arm pain    chronic, due to use of crutches   Dependence on crutches    left leg prosthesis   Drug addiction  in remission (HCC)    since 2007  (crack cocaine,  IV drug use)   Hair loss 02/23/2021   Hand weakness    Hepatic cirrhosis (HCC) 2018   followed by dr General Kenner (gi) due to hx chronic hepatitis c   Hiatal hernia    History of esophageal stricture    post dilation x2   History of hepatitis C    pt completed harvoni  treatment 2017, cured;  genotype 1a with fibrosis 3/4,  previously seen by dr comer   History of osteosarcoma    1982  left knee---  s/p  AKA LLE--   per pt no recurrence   History of recreational drug use multiple rehab visits   IV drug use for 1 yr and Smoked crack for 30+yrs--  per pt clean since 2007   Hyperlipidemia    Hypertension    Idiopathic peripheral neuropathy    Malaise and fatigue 12/05/2021   Memory changes 03/01/2020   OSA (obstructive sleep apnea)    study in epic 10-27-2018 by dr Omar Bibber,  moderate osa,  pt stated used cpap until approx. 07/ 2021, intolerate of mask   Osteoarthritis    Pancreatic cyst    followed by dr General Kenner--- per office  note bening and stable   Prolapsed internal hemorrhoids, grade 4 06/14/2015   Sigmoid diverticulosis    mild   Wears glasses      Past Surgical History:  Procedure Laterality Date   ABOVE KNEE LEG AMPUTATION Left 1982   osteosarcoma   BREAST BIOPSY Left 01/2021   x2   CARPAL TUNNEL RELEASE Right 10/18/2021   Procedure: RIGHT CARPAL TUNNEL RELEASE;  Surgeon: Wes Hamman, MD;  Location: [facility];  Service: Orthopedics;  Laterality: Right;   COLONOSCOPY WITH PROPOFOL   last one 04-28-2020  dr General Kenner   ESOPHAGOGASTRODUODENOSCOPY (EGD) WITH PROPOFOL   last one 11-02-2019  dr General Kenner   EXCISION BREAST BX  2015   CYST   EXCISION OF SKIN TAG N/A 07/14/2020   Procedure: EXCISION OF ANAL PAPILLA;  Surgeon: Joyce Nixon, MD;  Location: [facility];  Service: General;  Laterality: N/A;   STERIOD INJECTION Left 10/18/2021   Procedure: LEFT CARPAL TUNNEL INJECTION;  Surgeon: Wes Hamman, MD;  Location: [facility];  Service: Orthopedics;  Laterality: Left;   TONSILLECTOMY  as child   TOTAL KNEE ARTHROPLASTY Right 02/07/2022   Procedure: RIGHT TOTAL KNEE ARTHROPLASTY;  Surgeon: Timothy Ford, MD;  Location: [facility] OR;  Service: Orthopedics;  Laterality: Right;   TRANSANAL HEMORRHOIDAL DEARTERIALIZATION N/A 10/27/2015   Procedure: TRANSANAL HEMORRHOIDAL DEARTERIALIZATION;  Surgeon: Candyce Champagne, MD;  Location: [facility];  Service: General;  Laterality: N/A;   UPPER GASTROINTESTINAL ENDOSCOPY     Family History  Problem Relation Age of Onset   COPD Father    Alcohol abuse Father        Deceased   Hypertension Mother        Living   Healthy Daughter    Healthy Son    Anesthesia problems Neg Hx    Breast cancer Neg Hx    Colon cancer Neg Hx    Esophageal cancer Neg Hx    Rectal cancer Neg Hx    Stomach cancer Neg Hx    Colon polyps Neg Hx    Social History   Tobacco Use   Smoking status: Former    Current  packs/day: 0.00    Average packs/day: 1 pack/day for  41.7 years (41.7 ttl pk-yrs)    Types: Cigarettes    Start date: 04/02/1973    Quit date: 12/19/2014    Years since quitting: 8.6   Smokeless tobacco: Never  Vaping Use   Vaping status: Never Used  Substance Use Topics   Alcohol use: Never    Alcohol/week: 0.0 standard drinks of alcohol   Drug use: Not Currently    Types: "Crack" cocaine    Comment: recovery addict since 2007   Current Outpatient Medications  Medication Sig Dispense Refill   amitriptyline  (ELAVIL ) 25 MG tablet Take 1 tablet (25 mg total) by mouth at bedtime. 30 tablet 3   atorvastatin  (LIPITOR) 40 MG tablet Take 1 tablet by mouth once daily 90 tablet 3   DULoxetine  (CYMBALTA ) 30 MG capsule Take 1 capsule (30 mg total) by mouth daily. Qam 30 capsule 0   hydrochlorothiazide  (MICROZIDE ) 12.5 MG capsule Take 1 capsule (12.5 mg total) by mouth daily. 90 capsule 3   lisinopril  (ZESTRIL ) 20 MG tablet Take 1 tablet (20 mg total) by mouth daily. 90 tablet 3   No current facility-administered medications for this visit.   Allergies  Allergen Reactions   Morphine Swelling    REACTION: face swells   Codeine Other (See Comments)    Avoids due to being recovery addict     Review of Systems: All systems reviewed and negative except where noted in HPI.   Lab Results  Component Value Date   WBC 4.0 05/08/2023   HGB 14.1 05/08/2023   HCT 40.8 05/08/2023   MCV 86.6 05/08/2023   PLT 201.0 05/08/2023    Lab Results  Component Value Date   NA 141 05/08/2023   CL 107 05/08/2023   K 3.2 (L) 05/08/2023   CO2 26 05/08/2023   BUN 12 05/08/2023   CREATININE 0.98 05/08/2023   GFR 61.00 05/08/2023   CALCIUM  8.9 05/08/2023   ALBUMIN 4.3 05/08/2023   GLUCOSE 101 (H) 05/08/2023    Lab Results  Component Value Date   ALT 14 05/08/2023   AST 22 05/08/2023   ALKPHOS 104 05/08/2023   BILITOT 1.0 05/08/2023   Lab Results  Component Value Date   INR 1.2 (H) 05/08/2023    INR 1.2 (H) 11/07/2022   INR 1.1 (H) 05/07/2022    Physical Exam: BP (!) 130/90   Pulse 87   Ht 5\' 1"  (1.549 m)   Wt 155 lb (70.3 kg)   LMP 12/02/2010   BMI 29.29 kg/m  Constitutional: Pleasant,well-developed, female in no acute distress. Neurological: Alert and oriented to person place and time. Psychiatric: Normal mood and affect. Behavior is normal.   ASSESSMENT: 63 y.o. female here for assessment of the following  1. Cirrhosis of liver without ascites, unspecified hepatic cirrhosis type (HCC)   2. History of hepatitis C   3. Pancreatic cyst   4. AIN (anal intraepithelial neoplasia) anal canal    Patient has a history of hep C with suspected cirrhosis over the years.  She has been well compensated.  More recently her imaging has not shown overt cirrhosis and in fact had a normal elastography.  If she does have cirrhosis, she is very well compensated.  Platelet count is normal and with elastography score, can forego variceal screening at this time.  We discussed risks for decompensation and HCC yearly.  She is due for [facility] screening and labs every 6 months.  As below, will use MRCP for [facility] screening at this time.  Pancreatic cyst  has been stable, due for surveillance imaging.  Scheduled for MRCP next month.  Discussed pancreatic cyst in general, risk for malignant change of progression over time, hopefully things are stable and we can continue observation for now.  Reviewed history of AIN with the patient and given enlarged hypertrophied papilla on the last colonoscopy I had asked her to see Dr. Andy Bannister again of colorectal surgery.  She offered surgical resection, the patient was scheduled and then states she wanted to see another surgeon for a second opinion.  I offered her to see Dr. Ara Knee at [facility] and she wanted to proceed with that evaluation.   PLAN: - labs in October - CBC, CMET, INR, AFP, and every 6 months moving forward - f/u imaging MRCP in May [facility] screening  and evaluate pancreatic cyst - does not warrant screening EGD at this time for varices given platelets and elastography normal - refer to Dr. Aretta Beery [facility] colorectal surgery - history of AIN, abnormal colonoscopy findings / hypertrophied anal papillae - f/u 6 months or sooner with issues  Christi Coward, MD [facility]

## 2023-07-30 NOTE — Patient Instructions (Addendum)
 You will be due for labs in October. We will remind you when it is time to go.  You have been scheduled for an MRCP at [facility] on 08-07-23. Your appointment time is 9:00 am. Please arrive to admitting (at main entrance of the hospital) 30 minutes prior to your appointment time (8:30 am) for registration purposes. Please make certain not to have anything to eat or drink 6 hours prior to your test. In addition, if you have any metal in your body, have a pacemaker or defibrillator, please be sure to let your ordering physician know. This test typically takes 45 minutes to 1 hour to complete. Should you need to reschedule, please call [phone] to do so.  We are referring you to [facility], Aretta Beery.  They will contact you directly to schedule an appointment.  It may take a week or more before you hear from them.  Please feel free to contact us  if you have not heard from them within 2 weeks and we will follow up on the referral.   Thank you for entrusting me with your care and for choosing [facility], Dr. Alvester Johnson    If your blood pressure at your visit was 140/90 or greater, please contact your primary care physician to follow up on this. ______________________________________________________  If you are age 65 or older, your body mass index should be between 23-30. Your Body mass index is 29.29 kg/m. If this is out of the aforementioned range listed, please consider follow up with your Primary Care Provider.  If you are age 65 or younger, your body mass index should be between 19-25. Your Body mass index is 29.29 kg/m. If this is out of the aformentioned range listed, please consider follow up with your Primary Care Provider.  ________________________________________________________  The [facility] providers would like to encourage you to use MYCHART to communicate with providers for non-urgent requests or questions.  Due to long hold times on the  telephone, sending your provider a message by [facility] may be a faster and more efficient way to get a response.  Please allow 48 business hours for a response.  Please remember that this is for non-urgent requests.  _______________________________________________________  Due to recent changes in healthcare laws, you may see the results of your imaging and laboratory studies on MyChart before your provider has had a chance to review them.  We understand that in some cases there may be results that are confusing or concerning to you. Not all laboratory results come back in the same time frame and the provider may be waiting for multiple results in order to interpret others.  Please give us  48 hours in order for your provider to thoroughly review all the results before contacting the office for clarification of your results.

## 2023-07-31 ENCOUNTER — Other Ambulatory Visit: Payer: Self-pay

## 2023-07-31 DIAGNOSIS — K862 Cyst of pancreas: Secondary | ICD-10-CM

## 2023-07-31 NOTE — Progress Notes (Signed)
 Notified by pre cert that patient's insurance regards MRI to be done at [facility]

## 2023-08-01 NOTE — Telephone Encounter (Signed)
 Inbound call from [facility] requesting we fax over patients demographics to [phone]. Please advise.

## 2023-08-01 NOTE — Telephone Encounter (Signed)
 Faxed demographic sheet to [facility] per their request

## 2023-08-05 ENCOUNTER — Telehealth: Payer: Self-pay | Admitting: Gastroenterology

## 2023-08-05 NOTE — Telephone Encounter (Signed)
 Follow up on cancer surgery scheduled with  [facility]- patient would like a call back please.  Thank you.

## 2023-08-05 NOTE — Telephone Encounter (Signed)
 Called patient and LM that she can call Dr. Karle Ovens office at [facility] [phone]) to follow up on referral to Colorectal surgery for history of AIN, abnormal colonoscopy findings / hypertrophied anal papillae

## 2023-08-06 NOTE — Telephone Encounter (Signed)
 Patient has appointment with Dr. Ara Knee on Wed, 5-7 at 8:30am at [address] in [place]

## 2023-08-06 NOTE — Telephone Encounter (Signed)
 Patient has appointment with Dr. Ara Knee on Wed, 5-7 at 8:30am at [address] in [place] for patient with appointment info

## 2023-08-07 ENCOUNTER — Ambulatory Visit (HOSPITAL_COMMUNITY)

## 2023-08-07 DIAGNOSIS — K649 Unspecified hemorrhoids: Secondary | ICD-10-CM | POA: Diagnosis not present

## 2023-08-15 ENCOUNTER — Ambulatory Visit: Admitting: Orthopedic Surgery

## 2023-08-15 DIAGNOSIS — G4733 Obstructive sleep apnea (adult) (pediatric): Secondary | ICD-10-CM | POA: Diagnosis not present

## 2023-08-16 DIAGNOSIS — K644 Residual hemorrhoidal skin tags: Secondary | ICD-10-CM | POA: Diagnosis not present

## 2023-08-16 DIAGNOSIS — K649 Unspecified hemorrhoids: Secondary | ICD-10-CM | POA: Diagnosis not present

## 2023-08-16 DIAGNOSIS — K6289 Other specified diseases of anus and rectum: Secondary | ICD-10-CM | POA: Diagnosis not present

## 2023-08-16 DIAGNOSIS — K648 Other hemorrhoids: Secondary | ICD-10-CM | POA: Diagnosis not present

## 2023-08-18 ENCOUNTER — Other Ambulatory Visit

## 2023-08-20 ENCOUNTER — Telehealth: Payer: Self-pay | Admitting: Gastroenterology

## 2023-08-20 NOTE — Telephone Encounter (Signed)
 Inbound call from patient requesting to know if Dr. General Kenner is able to review 5/16 hemorrhoidectomy procedure that was completed with [facility]. Would like a call from Dr. Tena Feeling nurse to discuss results. Also requesting to know if we are able to send a copy of results through her [facility]. Please advise, thank you.

## 2023-08-20 NOTE — Telephone Encounter (Signed)
 Pt wanted to know if Dr General Kenner had seen her surgical report from her hemorrhoid surgery. Let her know this note will be sent to him and he can review the report. As for the results discussed with pt that she needs to contact the surgeon's office for the results. She verbalized understanding.

## 2023-08-20 NOTE — Telephone Encounter (Signed)
 Thanks [employer]. Agree with your recommendations. I have not yet seen the operative report or pathology results. It is not yet listed in careeverywhere

## 2023-09-06 ENCOUNTER — Other Ambulatory Visit (HOSPITAL_COMMUNITY): Payer: Self-pay

## 2023-09-10 DIAGNOSIS — K649 Unspecified hemorrhoids: Secondary | ICD-10-CM | POA: Diagnosis not present

## 2023-09-15 DIAGNOSIS — G4733 Obstructive sleep apnea (adult) (pediatric): Secondary | ICD-10-CM | POA: Diagnosis not present

## 2023-09-30 ENCOUNTER — Telehealth: Payer: Self-pay | Admitting: Gastroenterology

## 2023-09-30 NOTE — Telephone Encounter (Signed)
 Patient called and stated that she would like to schedule her MRI appointment again. Patient is requesting a call back from the nurse. Please advise.

## 2023-09-30 NOTE — Telephone Encounter (Signed)
 Patient has been given the phone number to radiology central scheduling. She can speak with them directly to choose a date/time that works well for her. Order is already in EPIC.

## 2023-10-15 ENCOUNTER — Ambulatory Visit
Admission: RE | Admit: 2023-10-15 | Discharge: 2023-10-15 | Disposition: A | Discharge status: Home / Self Care | Source: Ambulatory Visit | Attending: Gastroenterology | Admitting: Gastroenterology

## 2023-10-15 DIAGNOSIS — K862 Cyst of pancreas: Secondary | ICD-10-CM

## 2023-10-15 DIAGNOSIS — G4733 Obstructive sleep apnea (adult) (pediatric): Secondary | ICD-10-CM | POA: Diagnosis not present

## 2023-10-15 MED ORDER — GADOPICLENOL 0.5 MMOL/ML IV SOLN
7.0000 mL | Freq: Once | INTRAVENOUS | Status: AC | PRN
  Administered 2023-10-15: 7 mL via INTRAVENOUS

## 2023-10-19 ENCOUNTER — Ambulatory Visit: Payer: Self-pay | Admitting: Gastroenterology

## 2023-12-05 ENCOUNTER — Ambulatory Visit: Admitting: Orthopedic Surgery

## 2023-12-06 ENCOUNTER — Encounter: Payer: Self-pay | Admitting: Family

## 2023-12-08 NOTE — Progress Notes (Signed)
   This encounter was created in error - please disregard. No show

## 2023-12-19 ENCOUNTER — Ambulatory Visit (INDEPENDENT_AMBULATORY_CARE_PROVIDER_SITE_OTHER): Admitting: Student

## 2023-12-19 ENCOUNTER — Other Ambulatory Visit: Payer: Self-pay

## 2023-12-19 ENCOUNTER — Encounter: Payer: Self-pay | Admitting: Student

## 2023-12-19 VITALS — BP 162/96 | HR 80 | Temp 98.0°F | Ht 61.0 in | Wt 159.8 lb

## 2023-12-19 DIAGNOSIS — I10 Essential (primary) hypertension: Secondary | ICD-10-CM

## 2023-12-19 DIAGNOSIS — R413 Other amnesia: Secondary | ICD-10-CM | POA: Diagnosis not present

## 2023-12-19 MED ORDER — AMLODIPINE BESYLATE 5 MG PO TABS
5.0000 mg | ORAL_TABLET | Freq: Every day | ORAL | 11 refills | Status: AC
Start: 2023-12-19 — End: 2024-12-18

## 2023-12-19 MED ORDER — LOSARTAN POTASSIUM 25 MG PO TABS: 25.0000 mg | ORAL_TABLET | Freq: Every day | ORAL | 11 refills | Status: AC

## 2023-12-19 NOTE — Progress Notes (Unsigned)
 Established Patient Office Visit  Subjective   Patient ID: Stacy Thomas, female    DOB: December 11, 1960  Age: 63 y.o. MRN: [id number]  Chief Complaint  Patient presents with   Follow-up   Medication Refill   Hypertension   Memory Loss    Pt reports that  she is having a lot of   memory lost and it is getting worse  each day  especially when bp get really high .Stacy    Stacy Thomas is a 63 y.o. who presents to the clinic for memory concerns and HTN. Please see problem based assessment and plan for additional details.  Patient presents with a documented history of memory loss starting in 2021 when a MoCA was performed with a result of 21/30. She presents alone today. She reports worsening of her memory that has impacted her daily life including being unsure of what she is supposed to do during her daily routine and getting lost when she drives for distances greater than to the grocery store. She reports being able to complete all ADLs and iADLs; however, patient was unsure about her medication regimen and without collateral information I am not sure how true this is. She denies no new medication changes and her dispense history shows noncompliance with her current regimen.   MoCA performed today had a score of 15/30 (picture of MoCA provided in note). I spoke on the phone with her husband who understands the concerns of her memory changes.  With greater than 2 domains impaired ***, I believe her cognition has worsened and will likely continue to decline.   Of note, both TSH and B12 were checked within the past three years and were WNL. PHQ-9 today is ***  Plan: -MRI of brain without contrast to assess for causes of memory changes, will assess for hippocampus atrophy -One month follow up: Husband was asked to be present with ALL medications -POA paperwork was provided to the patient to discuss with her chosen POA, patient understands the importance of selecting a POA who will respect her wishes  and be able to accurately speak for her when her cognition continues to decline and capacity is no longer present  -Will attempt to have her set up with the geriatric clinic with Dr. Trudy in the near future  -I advised the patient to stop driving to limit the risk of serious harm to her and others, patient voiced verbal understanding  -Patient will likely require hour long visits with our resident clinic as today's encounter was greater than one hour.    Patient Active Problem List   Diagnosis Date Noted   Trigger finger, right ring finger 10/10/2022   Shoulder pain, left 10/10/2022   Total knee replacement status, right 02/07/2022   Prediabetes 02/23/2021   CKD (chronic kidney disease) 09/11/2019   HLD (hyperlipidemia) 09/09/2018   Allergic sinusitis 12/03/2017   Hx of AKA (above knee amputation), left (HCC) 02/18/2017   Unilateral primary osteoarthritis, right knee 02/18/2017   Vitamin D  deficiency 05/04/2016   Bilateral primary osteoarthritis of hip 04/26/2016   Bilateral carpal tunnel syndrome 03/29/2016   Screening for cervical cancer 03/06/2016   PVD (peripheral vascular disease) (HCC) 03/05/2016   Pancreatic abnormality 03/05/2016   Hypertension 03/05/2016   Hepatic cirrhosis (HCC) 11/15/2015   Chronic hepatitis C (HCC) 06/15/2015   Hx of osteosarcoma 06/14/2015     Objective:     BP (!) 162/96 (BP Location: Right Arm, Cuff Size: Large)   Pulse 80  Temp 98 F (36.7 C) (Oral)   Ht 5' 1 (1.549 m)   Wt 159 lb 12.8 oz (72.5 kg)   LMP 12/02/2010   SpO2 97%   BMI 30.19 kg/m  BP Readings from Last 3 Encounters:  12/19/23 (!) 162/96  07/30/23 (!) 130/90  05/15/23 (!) 144/94   Wt Readings from Last 3 Encounters:  12/19/23 159 lb 12.8 oz (72.5 kg)  07/30/23 155 lb (70.3 kg)  05/15/23 156 lb (70.8 kg)      Physical Exam Vitals reviewed.  Constitutional:      General: She is not in acute distress.    Appearance: She is not ill-appearing, toxic-appearing or  diaphoretic.     Comments: Appears well dressed Does not appear disheveled   Cardiovascular:     Rate and Rhythm: Normal rate and regular rhythm.     Heart sounds: No murmur heard. Pulmonary:     Effort: Pulmonary effort is normal. No respiratory distress.     Breath sounds: Normal breath sounds. No wheezing or rales.  Musculoskeletal:     Right lower leg: No edema.     Comments: Left leg prosthetic internal jugular place   Skin:    General: Skin is warm and dry.  Neurological:     Mental Status: She is alert and oriented to person, place, and time.     Cranial Nerves: No cranial nerve deficit, dysarthria or facial asymmetry.     Motor: Motor function is intact. No weakness.     Gait: Gait is intact.     Comments: CN 3-12 intact  5/5 motor strength in the B/L UE & LE     Last metabolic panel Lab Results  Component Value Date   GLUCOSE 101 (H) 05/08/2023   NA 141 05/08/2023   K 3.2 (L) 05/08/2023   CL 107 05/08/2023   CO2 26 05/08/2023   BUN 12 05/08/2023   CREATININE 0.98 05/08/2023   GFR 61.00 05/08/2023   CALCIUM  8.9 05/08/2023   PROT 7.3 05/08/2023   ALBUMIN 4.3 05/08/2023   LABGLOB 2.4 12/05/2021   AGRATIO 1.9 12/05/2021   BILITOT 1.0 05/08/2023   ALKPHOS 104 05/08/2023   AST 22 05/08/2023   ALT 14 05/08/2023   ANIONGAP 8 01/29/2022   Last lipids Lab Results  Component Value Date   CHOL 209 (H) 09/08/2018   HDL 40 09/08/2018   LDLCALC 140 (H) 09/08/2018   TRIG 145 09/08/2018   CHOLHDL 5.2 (H) 09/08/2018   Last thyroid functions Lab Results  Component Value Date   TSH 1.700 12/05/2021      The ASCVD Risk score (Arnett DK, et al., 2019) failed to calculate for the following reasons:   Cannot find a previous HDL lab   Cannot find a previous total cholesterol lab    Assessment & Plan:   Problem List Items Addressed This Visit       Cardiovascular and Mediastinum   Hypertension   Relevant Medications   amLODipine  (NORVASC ) 5 MG tablet    losartan  (COZAAR ) 25 MG tablet   Other Relevant Orders   Basic metabolic panel with GFR   Other Visit Diagnoses       Memory changes    -  Primary   Relevant Orders   MR Brain Wo Contrast       No follow-ups on file.    Stacy Lease, DO

## 2023-12-19 NOTE — Assessment & Plan Note (Signed)
 Patient presents with a history of hypertension with a blood pressure today of 178/100, rechecked at 162/96. Their hypertension is uncontrolled on a regimen of hydrochlorothiazide  12.5 mg. She was on lisinopril  20 mg in the past but this has expired.  Prior CMP in February 2025, Scr was 0.98.   Plan: -Patient wishes to d/c hydrochlorothiazide  12.5 mg and lisinopril  20 mg -Patient is ok with starting amlodipine  5 mg and losratan 25 mg  -Patient needs to return in one month for BMP and HTN follow up  -BMP today  -Medication changes were communicated with husband during the appointment via telephone

## 2023-12-19 NOTE — Patient Instructions (Signed)
 Thank you, [name] for allowing us  to provide your care today. Today we discussed memory loss and blood pressure.  For the memory loss: please return in one month with your husband and medicines. I am going to have you see a geriatrician   For the blood pressure: please stop lisinopril  and hydrochlorothiazide . START amlodipine  5 mg and losartan  25 mg. Call the clinic if your blood pressure is less than 100/60 or if you start to feel dizzy.      I have ordered the following medication/changed the following medications:   Stop the following medications: Medications Discontinued During This Encounter  Medication Reason   lisinopril  (ZESTRIL ) 20 MG tablet Change in therapy   hydrochlorothiazide  (MICROZIDE ) 12.5 MG capsule      Start the following medications: Meds ordered this encounter  Medications   amLODipine  (NORVASC ) 5 MG tablet    Sig: Take 1 tablet (5 mg total) by mouth daily.    Dispense:  30 tablet    Refill:  11   losartan  (COZAAR ) 25 MG tablet    Sig: Take 1 tablet (25 mg total) by mouth daily.    Dispense:  30 tablet    Refill:  11     Follow up: One month for geriatric assessment with Dr. Trudy     Should you have any questions or concerns please call the internal medicine clinic at [phone].     Please note that our late policy has changed.  If you are more than 15 minutes late to your appointment, you may be asked to reschedule your appointment.  Dr. Kandis, D.O. [facility]

## 2023-12-20 ENCOUNTER — Ambulatory Visit: Payer: Self-pay | Admitting: Student

## 2023-12-20 LAB — BASIC METABOLIC PANEL WITH GFR
BUN/Creatinine Ratio: 12 (ref 12–28)
BUN: 11 mg/dL (ref 8–27)
CO2: 21 mmol/L (ref 20–29)
Calcium: 9.4 mg/dL (ref 8.7–10.3)
Chloride: 105 mmol/L (ref 96–106)
Creatinine, Ser: 0.92 mg/dL (ref 0.57–1.00)
Glucose: 87 mg/dL (ref 70–99)
Potassium: 3.9 mmol/L (ref 3.5–5.2)
Sodium: 141 mmol/L (ref 134–144)
eGFR: 69 mL/min/1.73 (ref >59)

## 2023-12-20 NOTE — Assessment & Plan Note (Signed)
 Patient presents with a documented history of memory loss starting in 2021 when a MoCA was performed with a result of 21/30. She presents alone today. She reports worsening of her memory that has impacted her daily life including being unsure of what she is supposed to do during her daily routine and getting lost when she drives for distances greater than to the grocery store. She reports being able to complete all ADLs and iADLs; however, patient was unsure about her medication regimen and without collateral information I am not sure how true this is. She denies no new medication changes and her dispense history shows noncompliance with her current regimen.   MoCA performed today had a score of 15/30 (picture of MoCA provided in note). I spoke on the phone with her husband who understands the concerns of her memory changes.  With greater than 2 domains impaired that is affecting her daily life and limiting her independence, I believe her cognition has worsened due to dementia and will continue to decline.   Of note, both TSH and B12 were checked within the past three years and were WNL. PHQ-9 today is 10; although the score is elevated with selection of symptoms including trouble concentrating, feeling hopeless, and having low enrgey which is more than likely due to her current cognition.   Plan: -MRI of brain without contrast to assess for causes of memory changes, will assess for hippocampus atrophy -One month follow up: Husband was asked to be present with ALL medications -POA paperwork was provided to the patient to discuss with her chosen POA, patient understands the importance of selecting a POA who will respect her wishes and be able to accurately speak for her when her cognition continues to decline and capacity is no longer present  -Will attempt to have her set up with the geriatric clinic with Dr. Trudy in the near future  -I advised the patient to stop driving to limit the risk of  serious harm to her and others, patient voiced verbal understanding  -Patient will likely require hour long visits with our resident clinic as today's encounter was greater than one hour.

## 2023-12-21 NOTE — Progress Notes (Signed)
 Internal Medicine Clinic Attending  Case discussed with the resident at the time of the visit.  We reviewed the resident's history and exam and pertinent patient test results.  I agree with the assessment, diagnosis, and plan of care documented in the resident's note.

## 2023-12-23 ENCOUNTER — Other Ambulatory Visit: Payer: Self-pay

## 2023-12-23 MED ORDER — ATORVASTATIN CALCIUM 40 MG PO TABS: 40.0000 mg | ORAL_TABLET | Freq: Every day | ORAL | 3 refills | Status: AC

## 2024-01-03 ENCOUNTER — Ambulatory Visit (HOSPITAL_COMMUNITY)

## 2024-01-06 NOTE — Telephone Encounter (Signed)
 Please see previous message.   Copied from CRM [id number]. Topic: Clinical - Medication Prior Auth >> Jan 06, 2024  1:02 PM DeAngela L wrote: Reason for CRM: Sueanne calling cause the patients insurance [employer] requires a prior on file could the patient provider please submit a PA for the MRI scheduled on 01/10/24  Sueanne with Pre-service center at [place] [phone] ext [phone] Fax num [phone]

## 2024-01-06 NOTE — Telephone Encounter (Signed)
 Pre Authorization is in progress.   Copied from CRM [id number]. Topic: Referral - Prior Authorization Question >> Jan 02, 2024 10:07 AM Mercer PEDLAR wrote: Reason for CRM: Sueanne calling from [facility] Pre [facility] center stating that prior auth is needed for MRI which is scheduled for 01/03/24 @12  PM. She stated that they need it before to that time or appointment will have to be rescheduled.   Fax: [phone] Phone: [phone]  [phone]

## 2024-01-07 ENCOUNTER — Ambulatory Visit: Admitting: Physician Assistant

## 2024-01-08 NOTE — Addendum Note (Signed)
 Addended by: Dolce Sylvia C on: 01/08/2024 08:54 AM   Modules accepted: Orders

## 2024-01-09 ENCOUNTER — Other Ambulatory Visit: Payer: Self-pay

## 2024-01-09 ENCOUNTER — Ambulatory Visit (INDEPENDENT_AMBULATORY_CARE_PROVIDER_SITE_OTHER): Admitting: Physician Assistant

## 2024-01-09 DIAGNOSIS — M1611 Unilateral primary osteoarthritis, right hip: Secondary | ICD-10-CM

## 2024-01-09 DIAGNOSIS — Z89611 Acquired absence of right leg above knee: Secondary | ICD-10-CM | POA: Diagnosis not present

## 2024-01-09 DIAGNOSIS — M79651 Pain in right thigh: Secondary | ICD-10-CM | POA: Diagnosis not present

## 2024-01-09 DIAGNOSIS — S78112A Complete traumatic amputation at level between left hip and knee, initial encounter: Secondary | ICD-10-CM

## 2024-01-09 NOTE — Progress Notes (Unsigned)
 Office Visit Note   Patient: Stacy Thomas           Date of Birth: September 06, 1960           MRN: [id number] Visit Date: 01/09/2024              Requested by: Nooruddin, Saad, MD [address] [place], [address] [place],  [place] [address] PCP: Nooruddin, Saad, MD  Chief Complaint  Patient presents with  . Left Leg - Follow-up    HX BKA       HPI: 63 y/o female with history of osteosarcoma of the left thigh.  She also has a history of right TKA.  S/P left AKA.  She reports today. Her prothesis no longer fits and is coming off her leg.  She has developed an antalgic gait and now has right thigh pain.   Her current socket knee and foot are well-worn in need of full replacement. Patient's current socket does not fit secondary to loss of residual volume. Patient cannot maintain suction with the current socket.     Assessment & Plan: Visit Diagnoses: No diagnosis found.  Plan: ***  Follow-Up Instructions: No follow-ups on file.   Ortho Exam  Patient is alert, oriented, no adenopathy, well-dressed, normal affect, normal respiratory effort.  Patients  right knee has full range of motion there is no effusion no bruising no ecchymosis varus and valgus stress is stable.  The patella tracks midline. She has right thigh pain.  Limited right hip motion internal and external rotation.  No cellulitis or edema.    Patient is an existing left transfemoral amputee.  Patient's current comorbidities are not expected to impact the ability to function with the prescribed prosthesis. Patient verbally communicates a strong desire to use a prosthesis. Patient currently requires mobility aids to ambulate without a prosthesis.  Expects not to use mobility aids with a new prosthesis.   Patient is a K3 level ambulator that spends a lot of time walking around on uneven terrain over obstacles, up and down stairs, and ambulates with a variable cadence.            Imaging: AP pelvis shows right hip  arthritis.  No femur deformity and right TKA without hardware defects.  Labs: Lab Results  Component Value Date   HGBA1C 5.8 (H) 12/05/2021   HGBA1C 5.9 (H) 02/20/2021   HGBA1C 5.9 (H) 02/29/2020     Lab Results  Component Value Date   ALBUMIN 4.3 05/08/2023   ALBUMIN 4.5 11/07/2022   ALBUMIN 4.4 05/07/2022    No results found for: MG Lab Results  Component Value Date   VD25OH 15.8 (L) 05/03/2016    No results found for: PREALBUMIN    Latest Ref Rng & Units 05/08/2023    8:20 AM 11/07/2022    1:15 PM 05/07/2022    1:16 PM  CBC EXTENDED  WBC 4.0 - 10.5 K/uL 4.0  4.9  4.4   RBC 3.87 - 5.11 Mil/uL 4.71  4.97  4.69   Hemoglobin 12.0 - 15.0 g/dL 85.8  85.5  86.2   HCT 36.0 - 46.0 % 40.8  43.7  40.3   Platelets 150.0 - 400.0 K/uL 201.0  242.0  239.0   NEUT# 1.4 - 7.7 K/uL 1.7  2.4  1.8   Lymph# 0.7 - 4.0 K/uL 1.8  2.0  2.1      There is no height or weight on file to calculate BMI.  Orders:  No orders  of the defined types were placed in this encounter.  No orders of the defined types were placed in this encounter.    Procedures: No procedures performed  Clinical Data: No additional findings.  ROS:  All other systems negative, except as noted in the HPI. Review of Systems  Objective: Vital Signs: LMP 12/02/2010   Specialty Comments:  No specialty comments available.  PMFS History: Patient Active Problem List   Diagnosis Date Noted  . Trigger finger, right ring finger 10/10/2022  . Shoulder pain, left 10/10/2022  . Total knee replacement status, right 02/07/2022  . Prediabetes 02/23/2021  . Memory loss 03/01/2020  . CKD (chronic kidney disease) 09/11/2019  . HLD (hyperlipidemia) 09/09/2018  . Allergic sinusitis 12/03/2017  . Hx of AKA (above knee amputation), left (HCC) 02/18/2017  . Unilateral primary osteoarthritis, right knee 02/18/2017  . Vitamin D  deficiency 05/04/2016  . Bilateral primary osteoarthritis of hip 04/26/2016  . Bilateral carpal  tunnel syndrome 03/29/2016  . Screening for cervical cancer 03/06/2016  . PVD (peripheral vascular disease) 03/05/2016  . Pancreatic abnormality 03/05/2016  . Hypertension 03/05/2016  . Hepatic cirrhosis (HCC) 11/15/2015  . Chronic hepatitis C (HCC) 06/15/2015  . Hx of osteosarcoma 06/14/2015   Past Medical History:  Diagnosis Date  . AIN (anal intraepithelial neoplasia) anal canal   . Bilateral arm pain    chronic, due to use of crutches  . Dependence on crutches    left leg prosthesis  . Drug addiction in remission (HCC)    since 2007  (crack cocaine,  IV drug use)  . Hair loss 02/23/2021  . Hand weakness   . Hepatic cirrhosis (HCC) 2018   followed by dr leigh (gi) due to hx chronic hepatitis c  . Hiatal hernia   . History of esophageal stricture    post dilation x2  . History of hepatitis C    pt completed harvoni  treatment 2017, cured;  genotype 1a with fibrosis 3/4,  previously seen by dr efrain  . History of osteosarcoma    1982  left knee---  s/p  AKA LLE--   per pt no recurrence  . History of recreational drug use multiple rehab visits   IV drug use for 1 yr and Smoked crack for 30+yrs--  per pt clean since 2007  . Hyperlipidemia   . Hypertension   . Idiopathic peripheral neuropathy   . Malaise and fatigue 12/05/2021  . Memory changes 03/01/2020  . OSA (obstructive sleep apnea)    study in epic 10-27-2018 by dr buck,  moderate osa,  pt stated used cpap until approx. 07/ 2021, intolerate of mask  . Osteoarthritis   . Pancreatic cyst    followed by dr leigh--- per office note bening and stable  . Prolapsed internal hemorrhoids, grade 4 06/14/2015  . Sigmoid diverticulosis    mild  . Wears glasses     Family History  Problem Relation Age of Onset  . COPD Father   . Alcohol abuse Father        Deceased  . Hypertension Mother        Living  . Healthy Daughter   . Healthy Son   . Anesthesia problems Neg Hx   . Breast cancer Neg Hx   . Colon cancer  Neg Hx   . Esophageal cancer Neg Hx   . Rectal cancer Neg Hx   . Stomach cancer Neg Hx   . Colon polyps Neg Hx     Past Surgical History:  Procedure Laterality Date  . ABOVE KNEE LEG AMPUTATION Left 1982   osteosarcoma  . BREAST BIOPSY Left 01/2021   x2  . CARPAL TUNNEL RELEASE Right 10/18/2021   Procedure: RIGHT CARPAL TUNNEL RELEASE;  Surgeon: Jerri Kay HERO, MD;  Location: [facility];  Service: Orthopedics;  Laterality: Right;  . COLONOSCOPY WITH PROPOFOL   last one 04-28-2020  dr leigh  . ESOPHAGOGASTRODUODENOSCOPY (EGD) WITH PROPOFOL   last one 11-02-2019  dr leigh  . EXCISION BREAST BX  2015   CYST  . EXCISION OF SKIN TAG N/A 07/14/2020   Procedure: EXCISION OF ANAL PAPILLA;  Surgeon: Debby Hila, MD;  [facility];  Service: General;  Laterality: N/A;  . STERIOD INJECTION Left 10/18/2021   Procedure: LEFT CARPAL TUNNEL INJECTION;  Surgeon: Jerri Kay HERO, MD;  Location: [facility];  Service: Orthopedics;  Laterality: Left;  . TONSILLECTOMY  as child  . TOTAL KNEE ARTHROPLASTY Right 02/07/2022   Procedure: RIGHT TOTAL KNEE ARTHROPLASTY;  Surgeon: Harden Jerona GAILS, MD;  Location: [facility];  Service: Orthopedics;  Laterality: Right;  . TRANSANAL HEMORRHOIDAL DEARTERIALIZATION N/A 10/27/2015   Procedure: TRANSANAL HEMORRHOIDAL DEARTERIALIZATION;  Surgeon: Elspeth Schultze, MD;  Location: [facility];  Service: General;  Laterality: N/A;  . UPPER GASTROINTESTINAL ENDOSCOPY     Social History   Occupational History  . Not on file  Tobacco Use  . Smoking status: Former    Current packs/day: 0.00    Average packs/day: 1 pack/day for 41.7 years (41.7 ttl pk-yrs)    Types: Cigarettes    Start date: 04/02/1973    Quit date: 12/19/2014    Years since quitting: 9.0  . Smokeless tobacco: Never  Vaping Use  . Vaping status: Never Used  Substance and Sexual Activity  . Alcohol use: Never    Alcohol/week: 0.0  standard drinks of alcohol  . Drug use: Not Currently    Types: Crack cocaine    Comment: recovery addict since 2007  . Sexual activity: Not on file

## 2024-01-10 ENCOUNTER — Encounter: Payer: Self-pay | Admitting: Physician Assistant

## 2024-01-10 ENCOUNTER — Ambulatory Visit (HOSPITAL_COMMUNITY)

## 2024-01-13 ENCOUNTER — Telehealth: Payer: Self-pay

## 2024-01-13 DIAGNOSIS — K7460 Unspecified cirrhosis of liver: Secondary | ICD-10-CM

## 2024-01-13 DIAGNOSIS — Z8619 Personal history of other infectious and parasitic diseases: Secondary | ICD-10-CM

## 2024-01-13 NOTE — Telephone Encounter (Signed)
 MyChart message to patient to go to the lab for Dr. Leigh

## 2024-01-13 NOTE — Telephone Encounter (Signed)
-----   Message from [facility] Clarita H sent at 07/30/2023 10:58 AM EDT ----- Regarding: labs due in Oct Due for cbc, cmet, inr and afp in October

## 2024-01-15 ENCOUNTER — Other Ambulatory Visit (INDEPENDENT_AMBULATORY_CARE_PROVIDER_SITE_OTHER)

## 2024-01-15 DIAGNOSIS — Z8619 Personal history of other infectious and parasitic diseases: Secondary | ICD-10-CM | POA: Diagnosis not present

## 2024-01-15 DIAGNOSIS — K7460 Unspecified cirrhosis of liver: Secondary | ICD-10-CM | POA: Diagnosis not present

## 2024-01-15 LAB — COMPREHENSIVE METABOLIC PANEL WITH GFR
ALT: 23 U/L (ref 0–35)
AST: 26 U/L (ref 0–37)
Albumin: 4.5 g/dL (ref 3.5–5.2)
Alkaline Phosphatase: 111 U/L (ref 39–117)
BUN: 12 mg/dL (ref 6–23)
CO2: 27 meq/L (ref 19–32)
Calcium: 9.3 mg/dL (ref 8.4–10.5)
Chloride: 104 meq/L (ref 96–112)
Creatinine, Ser: 1.06 mg/dL (ref 0.40–1.20)
GFR: 55.25 mL/min — ABNORMAL LOW (ref >60.00)
Glucose, Bld: 127 mg/dL — ABNORMAL HIGH (ref 70–99)
Potassium: 3.6 meq/L (ref 3.5–5.1)
Sodium: 140 meq/L (ref 135–145)
Total Bilirubin: 0.8 mg/dL (ref 0.2–1.2)
Total Protein: 7.5 g/dL (ref 6.0–8.3)

## 2024-01-15 LAB — CBC WITH DIFFERENTIAL/PLATELET
Basophils Absolute: 0 K/uL (ref 0.0–0.1)
Basophils Relative: 0.5 % (ref 0.0–3.0)
Eosinophils Absolute: 0.1 K/uL (ref 0.0–0.7)
Eosinophils Relative: 2.1 % (ref 0.0–5.0)
HCT: 42.2 % (ref 36.0–46.0)
Hemoglobin: 14 g/dL (ref 12.0–15.0)
Lymphocytes Relative: 44.6 % (ref 12.0–46.0)
Lymphs Abs: 2.3 K/uL (ref 0.7–4.0)
MCHC: 33.2 g/dL (ref 30.0–36.0)
MCV: 87.1 fl (ref 78.0–100.0)
Monocytes Absolute: 0.5 K/uL (ref 0.1–1.0)
Monocytes Relative: 9.7 % (ref 3.0–12.0)
Neutro Abs: 2.2 K/uL (ref 1.4–7.7)
Neutrophils Relative %: 43.1 % (ref 43.0–77.0)
Platelets: 248 K/uL (ref 150.0–400.0)
RBC: 4.85 Mil/uL (ref 3.87–5.11)
RDW: 14 % (ref 11.5–15.5)
WBC: 5.1 K/uL (ref 4.0–10.5)

## 2024-01-15 LAB — PROTIME-INR
INR: 1.2 ratio — ABNORMAL HIGH (ref 0.8–1.0)
Prothrombin Time: 12.6 s (ref 9.6–13.1)

## 2024-01-15 NOTE — Telephone Encounter (Signed)
 Called and spoke to patient. She will go to the lab soon

## 2024-01-16 ENCOUNTER — Ambulatory Visit: Payer: Self-pay | Admitting: Gastroenterology

## 2024-01-17 LAB — AFP TUMOR MARKER: AFP-Tumor Marker: 5.4 ng/mL

## 2024-01-23 ENCOUNTER — Ambulatory Visit
Admission: RE | Admit: 2024-01-23 | Discharge: 2024-01-23 | Disposition: A | Source: Ambulatory Visit | Attending: Family Medicine | Admitting: Family Medicine

## 2024-01-23 DIAGNOSIS — R413 Other amnesia: Secondary | ICD-10-CM | POA: Diagnosis not present

## 2024-01-30 ENCOUNTER — Ambulatory Visit: Admitting: Student

## 2024-02-03 ENCOUNTER — Encounter: Payer: Self-pay | Admitting: Radiology

## 2024-02-11 ENCOUNTER — Ambulatory Visit: Admitting: Orthopedic Surgery

## 2024-02-11 ENCOUNTER — Other Ambulatory Visit (INDEPENDENT_AMBULATORY_CARE_PROVIDER_SITE_OTHER): Payer: Self-pay

## 2024-02-11 ENCOUNTER — Encounter: Payer: Self-pay | Admitting: Orthopedic Surgery

## 2024-02-11 DIAGNOSIS — M1611 Unilateral primary osteoarthritis, right hip: Secondary | ICD-10-CM | POA: Diagnosis not present

## 2024-02-11 DIAGNOSIS — Z89612 Acquired absence of left leg above knee: Secondary | ICD-10-CM | POA: Diagnosis not present

## 2024-02-11 DIAGNOSIS — M5441 Lumbago with sciatica, right side: Secondary | ICD-10-CM

## 2024-02-11 DIAGNOSIS — S78112A Complete traumatic amputation at level between left hip and knee, initial encounter: Secondary | ICD-10-CM

## 2024-02-11 DIAGNOSIS — G8929 Other chronic pain: Secondary | ICD-10-CM

## 2024-02-11 MED ORDER — METHOCARBAMOL 500 MG PO TABS: 500.0000 mg | ORAL_TABLET | Freq: Three times a day (TID) | ORAL | 1 refills | Status: AC | PRN

## 2024-02-11 NOTE — Addendum Note (Signed)
 Addended by: FRANCES LIONEL CROME on: 02/11/2024 04:34 PM   Modules accepted: Orders

## 2024-02-11 NOTE — Progress Notes (Signed)
 Office Visit Note   Patient: Stacy Thomas           Date of Birth: [birth year]           MRN: [id number] Visit Date: 02/11/2024              Requested by: Nooruddin, Saad, MD [address] [place], [address] [place],  [place] [address] PCP: Nooruddin, Saad, MD  Chief Complaint  Patient presents with   Lower Back - Pain      HPI: Discussed the use of AI scribe software for clinical note transcription with the patient, who gave verbal consent to proceed.  History of Present Illness Stacy Thomas is a 63 year old female with a history of left lower extremity amputation who presents with increasing lower back pain.  She experiences severe lower back pain, described as 'about to kill me,' localized to the lower back without radiation down her leg. The pain is sharp and knife-like in her thigh, associated with her previous knee replacement surgery. It is exacerbated by walking and sitting, making it difficult to manage.  She has a history of left lower extremity amputation and recently underwent a right hip x-ray, which showed arthritis without fractures. A right femur x-ray revealed extensive calcification of the femoral artery. She mentions not being level when walking with her artificial leg, which may contribute to her discomfort.  She has been taking ibuprofen  for pain relief but has not tried muscle relaxants like Flexeril or Robaxin . She dislikes prednisone , which she has been prescribed in the past.  No pain with internal or external rotation of the right hip and no pain with range of motion of the right knee. Negative straight leg raise and good dorsalis pedis pulse.     Assessment & Plan: Visit Diagnoses:  1. Chronic midline low back pain with right-sided sciatica   2. Arthritis of right hip   3. Above knee amputation of left lower extremity (HCC)   4. Chronic midline low back pain without sciatica     Plan: Assessment and Plan Assessment & Plan Low back pain with lumbar  facet arthropathy and muscle spasm Chronic low back pain with lumbar facet arthropathy and muscle spasm, exacerbated by movement. Pain likely due to arthritis irritating the sciatic nerve. MRI needed for further evaluation. - Ordered MRI of the lumbar spine to assess nerve involvement. - Prescribed Robaxin  for muscle spasm relief. - Advised use of heat therapy on the back. - Referred to Dr. Eldonna for cortisone injection in the hip.  Right hip osteoarthritis with anterior thigh pain Right hip osteoarthritis with anterior thigh pain. X-ray shows increased arthritic changes. Pain may be due to arthritis or referred from the back. Cortisone injection planned to alleviate pain. - Referred to Dr. Eldonna for cortisone injection in the hip. - Will discuss potential hip replacement with Dr. Vernetta if pain persists after injection.      Follow-Up Instructions: Return if symptoms worsen or fail to improve.   Ortho Exam  Patient is alert, oriented, no adenopathy, well-dressed, normal affect, normal respiratory effort. Physical Exam CARDIOVASCULAR: Good dorsalis pedis pulse. MUSCULOSKELETAL: No pain with internal or external rotation of right hip. No pain with range of motion of right knee. Negative straight leg raise.      Imaging: No results found. No images are attached to the encounter.  Labs: Lab Results  Component Value Date   HGBA1C 5.8 (H) 12/05/2021   HGBA1C 5.9 (H) 02/20/2021  HGBA1C 5.9 (H) 02/29/2020     Lab Results  Component Value Date   ALBUMIN 4.5 01/15/2024   ALBUMIN 4.3 05/08/2023   ALBUMIN 4.5 11/07/2022    No results found for: MG Lab Results  Component Value Date   VD25OH 15.8 (L) 05/03/2016    No results found for: PREALBUMIN    Latest Ref Rng & Units 01/15/2024    4:31 PM 05/08/2023    8:20 AM 11/07/2022    1:15 PM  CBC EXTENDED  WBC 4.0 - 10.5 K/uL 5.1  4.0  4.9   RBC 3.87 - 5.11 Mil/uL 4.85  4.71  4.97   Hemoglobin 12.0 - 15.0 g/dL 85.9   85.8  85.5   HCT 36.0 - 46.0 % 42.2  40.8  43.7   Platelets 150.0 - 400.0 K/uL 248.0  201.0  242.0   NEUT# 1.4 - 7.7 K/uL 2.2  1.7  2.4   Lymph# 0.7 - 4.0 K/uL 2.3  1.8  2.0      There is no height or weight on file to calculate BMI.  Orders:  Orders Placed This Encounter  Procedures   XR Lumbar Spine 2-3 Views   Meds ordered this encounter  Medications   methocarbamol  (ROBAXIN ) 500 MG tablet    Sig: Take 1 tablet (500 mg total) by mouth every 8 (eight) hours as needed for muscle spasms.    Dispense:  30 tablet    Refill:  1     Procedures: No procedures performed  Clinical Data: No additional findings.  ROS:  All other systems negative, except as noted in the HPI. Review of Systems  Objective: Vital Signs: LMP 12/02/2010   Specialty Comments:  No specialty comments available.  PMFS History: Patient Active Problem List   Diagnosis Date Noted   Trigger finger, right ring finger 10/10/2022   Shoulder pain, left 10/10/2022   Total knee replacement status, right 02/07/2022   Prediabetes 02/23/2021   Memory loss 03/01/2020   CKD (chronic kidney disease) 09/11/2019   HLD (hyperlipidemia) 09/09/2018   Allergic sinusitis 12/03/2017   Hx of AKA (above knee amputation), left (HCC) 02/18/2017   Unilateral primary osteoarthritis, right knee 02/18/2017   Vitamin D  deficiency 05/04/2016   Bilateral primary osteoarthritis of hip 04/26/2016   Bilateral carpal tunnel syndrome 03/29/2016   Screening for cervical cancer 03/06/2016   PVD (peripheral vascular disease) 03/05/2016   Pancreatic abnormality 03/05/2016   Hypertension 03/05/2016   Hepatic cirrhosis (HCC) 11/15/2015   Chronic hepatitis C (HCC) 06/15/2015   Hx of osteosarcoma 06/14/2015   Past Medical History:  Diagnosis Date   AIN (anal intraepithelial neoplasia) anal canal    Bilateral arm pain    chronic, due to use of crutches   Dependence on crutches    left leg prosthesis   Drug addiction in remission  (HCC)    since 2007  (crack cocaine,  IV drug use)   Hair loss 02/23/2021   Hand weakness    Hepatic cirrhosis (HCC) 2018   followed by dr leigh (gi) due to hx chronic hepatitis c   Hiatal hernia    History of esophageal stricture    post dilation x2   History of hepatitis C    pt completed harvoni  treatment 2017, cured;  genotype 1a with fibrosis 3/4,  previously seen by dr comer   History of osteosarcoma    1982  left knee---  s/p  AKA LLE--   per pt no recurrence   History of  recreational drug use multiple rehab visits   IV drug use for 1 yr and Smoked crack for 30+yrs--  per pt clean since 2007   Hyperlipidemia    Hypertension    Idiopathic peripheral neuropathy    Malaise and fatigue 12/05/2021   Memory changes 03/01/2020   OSA (obstructive sleep apnea)    study in epic 10-27-2018 by dr buck,  moderate osa,  pt stated used cpap until approx. 07/ 2021, intolerate of mask   Osteoarthritis    Pancreatic cyst    followed by dr leigh--- per office note bening and stable   Prolapsed internal hemorrhoids, grade 4 06/14/2015   Sigmoid diverticulosis    mild   Wears glasses     Family History  Problem Relation Age of Onset   COPD Father    Alcohol abuse Father        Deceased   Hypertension Mother        Living   Healthy Daughter    Healthy Son    Anesthesia problems Neg Hx    Breast cancer Neg Hx    Colon cancer Neg Hx    Esophageal cancer Neg Hx    Rectal cancer Neg Hx    Stomach cancer Neg Hx    Colon polyps Neg Hx     Past Surgical History:  Procedure Laterality Date   ABOVE KNEE LEG AMPUTATION Left 1982   osteosarcoma   BREAST BIOPSY Left 01/2021   x2   CARPAL TUNNEL RELEASE Right 10/18/2021   Procedure: RIGHT CARPAL TUNNEL RELEASE;  Surgeon: Jerri Kay HERO, MD;  Location: [facility];  Service: Orthopedics;  Laterality: Right;   COLONOSCOPY WITH PROPOFOL   last one 04-28-2020  dr leigh   ESOPHAGOGASTRODUODENOSCOPY (EGD) WITH  PROPOFOL   last one 11-02-2019  dr leigh   EXCISION BREAST BX  2015   CYST   EXCISION OF SKIN TAG N/A 07/14/2020   Procedure: EXCISION OF ANAL PAPILLA;  Surgeon: Debby Hila, MD;  Location: [facility];  Service: General;  Laterality: N/A;   STERIOD INJECTION Left 10/18/2021   Procedure: LEFT CARPAL TUNNEL INJECTION;  Surgeon: Jerri Kay HERO, MD;  Location: [facility];  Service: Orthopedics;  Laterality: Left;   TONSILLECTOMY  as child   TOTAL KNEE ARTHROPLASTY Right 02/07/2022   Procedure: RIGHT TOTAL KNEE ARTHROPLASTY;  Surgeon: Harden Jerona GAILS, MD;  Location[facility];  Service: Orthopedics;  Laterality: Right;   TRANSANAL HEMORRHOIDAL DEARTERIALIZATION N/A 10/27/2015   Procedure: TRANSANAL HEMORRHOIDAL DEARTERIALIZATION;  Surgeon: Elspeth Schultze, MD;  Location: [facility];  Service: General;  Laterality: N/A;   UPPER GASTROINTESTINAL ENDOSCOPY     Social History   Occupational History   Not on file  Tobacco Use   Smoking status: Former    Current packs/day: 0.00    Average packs/day: 1 pack/day for 41.7 years (41.7 ttl pk-yrs)    Types: Cigarettes    Start date: 04/02/1973    Quit date: 12/19/2014    Years since quitting: 9.1   Smokeless tobacco: Never  Vaping Use   Vaping status: Never Used  Substance and Sexual Activity   Alcohol use: Never    Alcohol/week: 0.0 standard drinks of alcohol   Drug use: Not Currently    Types: Crack cocaine    Comment: recovery addict since 2007   Sexual activity: Not on file

## 2024-02-12 ENCOUNTER — Telehealth: Payer: Self-pay | Admitting: Physical Medicine and Rehabilitation

## 2024-02-12 ENCOUNTER — Ambulatory Visit: Admitting: Student

## 2024-02-12 VITALS — BP 138/82 | HR 92 | Temp 98.1°F | Ht 61.0 in | Wt 162.4 lb

## 2024-02-12 DIAGNOSIS — R413 Other amnesia: Secondary | ICD-10-CM

## 2024-02-12 NOTE — Patient Instructions (Signed)
 Thank you so much for coming to the clinic today!   Your MRI looks great! I don't see anything that could explain your memory loss I am going to send you to a brain doctor for further evaluation of your memory loss   If you have any questions please feel free to the call the clinic at anytime at [phone]. It was a pleasure seeing you!  Best, Dr. Damin Salido

## 2024-02-12 NOTE — Telephone Encounter (Signed)
 Patient called and said she needs an appointment for back injection. CB#[phone]

## 2024-02-13 NOTE — Progress Notes (Signed)
 CC: F/U Brain MRI   HPI:  Stacy Thomas is a 63 y.o. female living with a history stated below and presents today for brain MRI review. Please see problem based assessment and plan for additional details.  Past Medical History:  Diagnosis Date   AIN (anal intraepithelial neoplasia) anal canal    Bilateral arm pain    chronic, due to use of crutches   Dependence on crutches    left leg prosthesis   Drug addiction in remission (HCC)    since 2007  (crack cocaine,  IV drug use)   Hair loss 02/23/2021   Hand weakness    Hepatic cirrhosis (HCC) 2018   followed by dr leigh (gi) due to hx chronic hepatitis c   Hiatal hernia    History of esophageal stricture    post dilation x2   History of hepatitis C    pt completed harvoni  treatment 2017, cured;  genotype 1a with fibrosis 3/4,  previously seen by dr comer   History of osteosarcoma    1982  left knee---  s/p  AKA LLE--   per pt no recurrence   History of recreational drug use multiple rehab visits   IV drug use for 1 yr and Smoked crack for 30+yrs--  per pt clean since 2007   Hyperlipidemia    Hypertension    Idiopathic peripheral neuropathy    Malaise and fatigue 12/05/2021   Memory changes 03/01/2020   OSA (obstructive sleep apnea)    study in epic 10-27-2018 by dr buck,  moderate osa,  pt stated used cpap until approx. 07/ 2021, intolerate of mask   Osteoarthritis    Pancreatic cyst    followed by dr leigh--- per office note bening and stable   Prolapsed internal hemorrhoids, grade 4 06/14/2015   Sigmoid diverticulosis    mild   Wears glasses     Current Outpatient Medications on File Prior to Visit  Medication Sig Dispense Refill   amLODipine  (NORVASC ) 5 MG tablet Take 1 tablet (5 mg total) by mouth daily. 30 tablet 11   atorvastatin  (LIPITOR) 40 MG tablet Take 1 tablet (40 mg total) by mouth daily. 90 tablet 3   DULoxetine  (CYMBALTA ) 30 MG capsule Take 1 capsule (30 mg total) by mouth daily. Qam 30  capsule 0   losartan  (COZAAR ) 25 MG tablet Take 1 tablet (25 mg total) by mouth daily. 30 tablet 11   methocarbamol  (ROBAXIN ) 500 MG tablet Take 1 tablet (500 mg total) by mouth every 8 (eight) hours as needed for muscle spasms. 30 tablet 1   No current facility-administered medications on file prior to visit.    Family History  Problem Relation Age of Onset   COPD Father    Alcohol abuse Father        Deceased   Hypertension Mother        Living   Healthy Daughter    Healthy Son    Anesthesia problems Neg Hx    Breast cancer Neg Hx    Colon cancer Neg Hx    Esophageal cancer Neg Hx    Rectal cancer Neg Hx    Stomach cancer Neg Hx    Colon polyps Neg Hx     Social History   Socioeconomic History   Marital status: Married    Spouse name: Not on file   Number of children: Not on file   Years of education: Not on file   Highest education level: Not on  file  Occupational History   Not on file  Tobacco Use   Smoking status: Former    Current packs/day: 0.00    Average packs/day: 1 pack/day for 41.7 years (41.7 ttl pk-yrs)    Types: Cigarettes    Start date: 04/02/1973    Quit date: 12/19/2014    Years since quitting: 9.1   Smokeless tobacco: Never  Vaping Use   Vaping status: Never Used  Substance and Sexual Activity   Alcohol use: Never    Alcohol/week: 0.0 standard drinks of alcohol   Drug use: Not Currently    Types: Crack cocaine    Comment: recovery addict since 2007   Sexual activity: Not on file  Other Topics Concern   Not on file  Social History Narrative   Lives with husband in a one story home.  Has 3 children.  Does not work.    Education: some college.   Social Drivers of Health   Financial Resource Strain: Patient Declined (02/12/2024)   Overall Financial Resource Strain (CARDIA)    Difficulty of Paying Living Expenses: Patient declined  Food Insecurity: No Food Insecurity (02/12/2024)   Hunger Vital Sign    Worried About Running Out of Food in  the Last Year: Never true    Ran Out of Food in the Last Year: Never true  Transportation Needs: No Transportation Needs (02/12/2024)   PRAPARE - Administrator, Civil Service (Medical): No    Lack of Transportation (Non-Medical): No  Physical Activity: Inactive (02/12/2024)   Exercise Vital Sign    Days of Exercise per Week: 0 days    Minutes of Exercise per Session: 0 min  Stress: Patient Declined (02/12/2024)   [employer] of Occupational Health - Occupational Stress Questionnaire    Feeling of Stress: Patient declined  Social Connections: Unknown (02/12/2024)   Social Connection and Isolation Panel    Frequency of Communication with Friends and Family: Patient declined    Frequency of Social Gatherings with Friends and Family: Patient declined    Attends Religious Services: More than 4 times per year    Active Member of [employer] or Organizations: Patient declined    Attends Banker Meetings: Patient declined    Marital Status: Married  Catering Manager Violence: Not At Risk (02/12/2024)   Humiliation, Afraid, Rape, and Kick questionnaire    Fear of Current or Ex-Partner: No    Emotionally Abused: No    Physically Abused: No    Sexually Abused: No    Review of Systems: ROS negative except for what is noted on the assessment and plan.  Vitals:   02/12/24 0841  BP: 138/82  Pulse: 92  Temp: 98.1 F (36.7 C)  TempSrc: Oral  SpO2: 94%  Weight: 162 lb 6.4 oz (73.7 kg)  Height: 5' 1 (1.549 m)    Physical Exam: Constitutional: well-appearing female in no acute distress Cardiovascular: regular rate and rhythm, no m/r/g Pulmonary/Chest: normal work of breathing on room air, lungs clear to auscultation bilaterally Abdominal: soft, non-tender, non-distended MSK: normal bulk and tone Neurological: alert & oriented x 3, 5/5 strength in bilateral upper and lower extremities, normal gait   Assessment & Plan:   Memory loss Patient with history of  memory loss that has been going on for about a year or so now.  Today, she states she is having a good day, and had no trouble driving or remembering what was said previously.  She does endorse intermittent bad days as  well.  At her last visit, her brain MRI was done to further evaluate for cerebral atrophy, which was not evident on her MRI.  Her B12, B1, TSH have all been normal in the past.  Her MoCA score today is 28 out of 30, as she states that today she is having a good day.  However historically it seems her last MOCA was 15 out of 30.  Because of this ongoing issue, will refer to neurology for further management.     Patient discussed with Dr. Trudy Dirks Lemon Sternberg, M.D. [facility], PGY-3 Pager: [phone] Date 02/13/2024 Time 10:34 AM

## 2024-02-13 NOTE — Assessment & Plan Note (Signed)
 Patient with history of memory loss that has been going on for about a year or so now.  Today, she states she is having a good day, and had no trouble driving or remembering what was said previously.  She does endorse intermittent bad days as well.  At her last visit, her brain MRI was done to further evaluate for cerebral atrophy, which was not evident on her MRI.  Her B12, B1, TSH have all been normal in the past.  Her MoCA score today is 28 out of 30, as she states that today she is having a good day.  However historically it seems her last MOCA was 15 out of 30.  Because of this ongoing issue, will refer to neurology for further management.

## 2024-02-14 ENCOUNTER — Other Ambulatory Visit: Payer: Self-pay

## 2024-02-14 ENCOUNTER — Ambulatory Visit (INDEPENDENT_AMBULATORY_CARE_PROVIDER_SITE_OTHER): Admitting: Physical Medicine and Rehabilitation

## 2024-02-14 VITALS — BP 146/92 | HR 80

## 2024-02-14 DIAGNOSIS — M25551 Pain in right hip: Secondary | ICD-10-CM | POA: Diagnosis not present

## 2024-02-14 MED ORDER — BUPIVACAINE HCL 0.25 % IJ SOLN
4.0000 mL | INTRAMUSCULAR | Status: AC | PRN
  Administered 2024-02-14: 4 mL via INTRA_ARTICULAR

## 2024-02-14 MED ORDER — TRIAMCINOLONE ACETONIDE 40 MG/ML IJ SUSP
40.0000 mg | INTRAMUSCULAR | Status: AC | PRN
  Administered 2024-02-14: 40 mg via INTRA_ARTICULAR

## 2024-02-14 NOTE — Progress Notes (Signed)
 RAILYNN Thomas - 63 y.o. female MRN [id number]  Date of birth: February 13, 1961  Office Visit Note: Visit Date: 02/14/2024 PCP: Nooruddin, Saad, MD Referred by: Nooruddin, Saad, MD  Subjective: No chief complaint on file.  HPI:  Stacy Thomas is a 63 y.o. female who comes in today at the request of Dr. Jerona Sage for planned Right anesthetic hip arthrogram with fluoroscopic guidance.  The patient has failed conservative care including home exercise, medications, time and activity modification.  This injection will be diagnostic and hopefully therapeutic.  Please see requesting physician notes for further details and justification.   ROS Otherwise per HPI.  Assessment & Plan: Visit Diagnoses:    ICD-10-CM   1. Pain in right hip  M25.551 XR C-ARM NO REPORT      Plan: No additional findings.   Meds & Orders: No orders of the defined types were placed in this encounter.   Orders Placed This Encounter  Procedures   Large Joint Inj   XR C-ARM NO REPORT    Follow-up: Return for visit to requesting provider as needed.   Procedures: Large Joint Inj: R hip joint on 02/14/2024 8:21 AM Indications: diagnostic evaluation and pain Details: 22 G 3.5 in needle, fluoroscopy-guided anterior approach  Arthrogram: No  Medications: 4 mL bupivacaine  0.25 %; 40 mg triamcinolone  acetonide 40 MG/ML Outcome: tolerated well, no immediate complications  There was excellent flow of contrast producing a partial arthrogram of the hip. The patient did have relief of symptoms during the anesthetic phase of the injection. Procedure, treatment alternatives, risks and benefits explained, specific risks discussed. Consent was given by the patient. Immediately prior to procedure a time out was called to verify the correct patient, procedure, equipment, support staff and site/side marked as required. Patient was prepped and draped in the usual sterile fashion.          Clinical History: No specialty  comments available.     Objective:  VS:  HT:    WT:   BMI:     BP:   HR: bpm  TEMP: ( )  RESP:  Physical Exam Vitals and nursing note reviewed.  Constitutional:      General: She is not in acute distress.    Appearance: Normal appearance. She is not ill-appearing.  HENT:     Head: Normocephalic and atraumatic.     Right Ear: External ear normal.     Left Ear: External ear normal.  Eyes:     Extraocular Movements: Extraocular movements intact.  Cardiovascular:     Rate and Rhythm: Normal rate.     Pulses: Normal pulses.  Pulmonary:     Effort: Pulmonary effort is normal. No respiratory distress.  Abdominal:     General: There is no distension.     Palpations: Abdomen is soft.  Musculoskeletal:        General: Tenderness present.     Cervical back: Neck supple.     Right lower leg: No edema.     Left lower leg: No edema.     Comments: Patient has good distal strength with no pain over the greater trochanters.  No clonus or focal weakness.  Skin:    Findings: No erythema, lesion or rash.  Neurological:     General: No focal deficit present.     Mental Status: She is alert and oriented to person, place, and time.     Sensory: No sensory deficit.     Motor: No weakness or abnormal  muscle tone.     Coordination: Coordination normal.  Psychiatric:        Mood and Affect: Mood normal.        Behavior: Behavior normal.      Imaging: No results found.

## 2024-02-14 NOTE — Progress Notes (Signed)
 Pain Scale   Average Pain 8 Patient advising she has chronic right hip pain that is constant        +Driver, -BT, -Dye Allergies.

## 2024-02-25 NOTE — Progress Notes (Signed)
 Internal Medicine Clinic Attending  Case discussed with the resident at the time of the visit.  We reviewed the resident's history and exam and pertinent patient test results.  I agree with the assessment, diagnosis, and plan of care documented in the resident's note.

## 2024-03-04 ENCOUNTER — Ambulatory Visit: Admitting: Family

## 2024-03-09 ENCOUNTER — Ambulatory Visit: Admitting: Orthopedic Surgery

## 2024-03-09 ENCOUNTER — Encounter: Payer: Self-pay | Admitting: Orthopedic Surgery

## 2024-03-09 DIAGNOSIS — G8929 Other chronic pain: Secondary | ICD-10-CM

## 2024-03-09 NOTE — Progress Notes (Signed)
 Office Visit Note   Patient: Stacy Thomas           Date of Birth: 09-06-1960           MRN: [id number] Visit Date: 03/09/2024              Requested by: Nooruddin, Saad, MD [address] [place], [address] [place],  [place] [address] PCP: Nooruddin, Saad, MD  Chief Complaint  Patient presents with   Lower Back - Pain      HPI: Discussed the use of AI scribe software for clinical note transcription with the patient, who gave verbal consent to proceed.  History of Present Illness Stacy Thomas is a 63 year old female who presents with severe lower back pain following a cortisone injection. She received a cortisone injection from Doctor Eldonna, after which her back pain worsened.  She experiences severe lower back pain that has persisted since receiving a cortisone injection. The pain is centered in the lower lumbar spine and she describes the sensation as if the injection 'hit a bone', which has exacerbated her condition.  The pain is constant, localized, and does not radiate to other areas. She has tried previous treatments, including the cortisone injection, which did not alleviate her symptoms and instead worsened her pain. She is seeking further intervention as she feels unable to endure the pain any longer.  No focal motor weakness is reported.     Assessment & Plan: Visit Diagnoses:  1. Chronic midline low back pain with right-sided sciatica     Plan: Assessment and Plan Assessment & Plan Chronic low back pain with antalgic gait Chronic low back pain in lower lumbar spine worsened post-cortisone injection. Conservative measures exhausted. Considering spine fusion surgery due to persistent pain. - Referred to Dr. Georgina for spine fusion surgery evaluation.      Follow-Up Instructions: Return if symptoms worsen or fail to improve.   Ortho Exam  Patient is alert, oriented, no adenopathy, well-dressed, normal affect, normal respiratory effort. Physical  Exam MUSCULOSKELETAL: Antalgic gait NEUROLOGICAL: No focal motor weakness      Imaging: No results found. No images are attached to the encounter.  Labs: Lab Results  Component Value Date   HGBA1C 5.8 (H) 12/05/2021   HGBA1C 5.9 (H) 02/20/2021   HGBA1C 5.9 (H) 02/29/2020     Lab Results  Component Value Date   ALBUMIN 4.5 01/15/2024   ALBUMIN 4.3 05/08/2023   ALBUMIN 4.5 11/07/2022    No results found for: MG Lab Results  Component Value Date   VD25OH 15.8 (L) 05/03/2016    No results found for: PREALBUMIN    Latest Ref Rng & Units 01/15/2024    4:31 PM 05/08/2023    8:20 AM 11/07/2022    1:15 PM  CBC EXTENDED  WBC 4.0 - 10.5 K/uL 5.1  4.0  4.9   RBC 3.87 - 5.11 Mil/uL 4.85  4.71  4.97   Hemoglobin 12.0 - 15.0 g/dL 85.9  85.8  85.5   HCT 36.0 - 46.0 % 42.2  40.8  43.7   Platelets 150.0 - 400.0 K/uL 248.0  201.0  242.0   NEUT# 1.4 - 7.7 K/uL 2.2  1.7  2.4   Lymph# 0.7 - 4.0 K/uL 2.3  1.8  2.0      There is no height or weight on file to calculate BMI.  Orders:  No orders of the defined types were placed in this encounter.  No orders of the defined  types were placed in this encounter.    Procedures: No procedures performed  Clinical Data: No additional findings.  ROS:  All other systems negative, except as noted in the HPI. Review of Systems  Objective: Vital Signs: LMP 12/02/2010   Specialty Comments:  No specialty comments available.  PMFS History: Patient Active Problem List   Diagnosis Date Noted   Trigger finger, right ring finger 10/10/2022   Shoulder pain, left 10/10/2022   Total knee replacement status, right 02/07/2022   Prediabetes 02/23/2021   Memory loss 03/01/2020   CKD (chronic kidney disease) 09/11/2019   HLD (hyperlipidemia) 09/09/2018   Allergic sinusitis 12/03/2017   Hx of AKA (above knee amputation), left (HCC) 02/18/2017   Unilateral primary osteoarthritis, right knee 02/18/2017   Vitamin D  deficiency 05/04/2016    Bilateral primary osteoarthritis of hip 04/26/2016   Bilateral carpal tunnel syndrome 03/29/2016   Screening for cervical cancer 03/06/2016   PVD (peripheral vascular disease) 03/05/2016   Pancreatic abnormality 03/05/2016   Hypertension 03/05/2016   Hepatic cirrhosis (HCC) 11/15/2015   Chronic hepatitis C (HCC) 06/15/2015   Hx of osteosarcoma 06/14/2015   Past Medical History:  Diagnosis Date   AIN (anal intraepithelial neoplasia) anal canal    Bilateral arm pain    chronic, due to use of crutches   Dependence on crutches    left leg prosthesis   Drug addiction in remission (HCC)    since 2007  (crack cocaine,  IV drug use)   Hair loss 02/23/2021   Hand weakness    Hepatic cirrhosis (HCC) 2018   followed by dr leigh (gi) due to hx chronic hepatitis c   Hiatal hernia    History of esophageal stricture    post dilation x2   History of hepatitis C    pt completed harvoni  treatment 2017, cured;  genotype 1a with fibrosis 3/4,  previously seen by dr comer   History of osteosarcoma    1982  left knee---  s/p  AKA LLE--   per pt no recurrence   History of recreational drug use multiple rehab visits   IV drug use for 1 yr and Smoked crack for 30+yrs--  per pt clean since 2007   Hyperlipidemia    Hypertension    Idiopathic peripheral neuropathy    Malaise and fatigue 12/05/2021   Memory changes 03/01/2020   OSA (obstructive sleep apnea)    study in epic 10-27-2018 by dr buck,  moderate osa,  pt stated used cpap until approx. 07/ 2021, intolerate of mask   Osteoarthritis    Pancreatic cyst    followed by dr leigh--- per office note bening and stable   Prolapsed internal hemorrhoids, grade 4 06/14/2015   Sigmoid diverticulosis    mild   Wears glasses     Family History  Problem Relation Age of Onset   COPD Father    Alcohol abuse Father        Deceased   Hypertension Mother        Living   Healthy Daughter    Healthy Son    Anesthesia problems Neg Hx     Breast cancer Neg Hx    Colon cancer Neg Hx    Esophageal cancer Neg Hx    Rectal cancer Neg Hx    Stomach cancer Neg Hx    Colon polyps Neg Hx     Past Surgical History:  Procedure Laterality Date   ABOVE KNEE LEG AMPUTATION Left 1982   osteosarcoma  BREAST BIOPSY Left 01/2021   x2   CARPAL TUNNEL RELEASE Right 10/18/2021   Procedure: RIGHT CARPAL TUNNEL RELEASE;  Surgeon: Jerri Kay HERO, MD;  Location: [facility];  Service: Orthopedics;  Laterality: Right;   COLONOSCOPY WITH PROPOFOL   last one 04-28-2020  dr leigh   ESOPHAGOGASTRODUODENOSCOPY (EGD) WITH PROPOFOL   last one 11-02-2019  dr leigh   EXCISION BREAST BX  2015   CYST   EXCISION OF SKIN TAG N/A 07/14/2020   Procedure: EXCISION OF ANAL PAPILLA;  Surgeon: Debby Hila, MD;  Location[facility];  Service: General;  Laterality: N/A;   STERIOD INJECTION Left 10/18/2021   Procedure: LEFT CARPAL TUNNEL INJECTION;  Surgeon: Jerri Kay HERO, MD;  Location: [facility];  Service: Orthopedics;  Laterality: Left;   TONSILLECTOMY  as child   TOTAL KNEE ARTHROPLASTY Right 02/07/2022   Procedure: RIGHT TOTAL KNEE ARTHROPLASTY;  Surgeon: Harden Jerona GAILS, MD;  Location: [facility];  Service: Orthopedics;  Laterality: Right;   TRANSANAL HEMORRHOIDAL DEARTERIALIZATION N/A 10/27/2015   Procedure: TRANSANAL HEMORRHOIDAL DEARTERIALIZATION;  Surgeon: Elspeth Schultze, MD;  Location: [facility];  Service: General;  Laterality: N/A;   UPPER GASTROINTESTINAL ENDOSCOPY     Social History   Occupational History   Not on file  Tobacco Use   Smoking status: Former    Current packs/day: 0.00    Average packs/day: 1 pack/day for 41.7 years (41.7 ttl pk-yrs)    Types: Cigarettes    Start date: 04/02/1973    Quit date: 12/19/2014    Years since quitting: 9.2   Smokeless tobacco: Never  Vaping Use   Vaping status: Never Used  Substance and Sexual Activity   Alcohol use: Never     Alcohol/week: 0.0 standard drinks of alcohol   Drug use: Not Currently    Types: Crack cocaine    Comment: recovery addict since 2007   Sexual activity: Not on file

## 2024-03-11 ENCOUNTER — Ambulatory Visit: Admitting: Family

## 2024-03-30 ENCOUNTER — Ambulatory Visit: Admitting: Student

## 2024-03-30 VITALS — BP 114/81 | HR 90 | Temp 98.7°F | Wt 162.0 lb

## 2024-03-30 DIAGNOSIS — M545 Low back pain, unspecified: Secondary | ICD-10-CM

## 2024-03-30 DIAGNOSIS — G8929 Other chronic pain: Secondary | ICD-10-CM | POA: Insufficient documentation

## 2024-03-30 DIAGNOSIS — G4733 Obstructive sleep apnea (adult) (pediatric): Secondary | ICD-10-CM | POA: Diagnosis not present

## 2024-03-30 MED ORDER — DULOXETINE HCL 60 MG PO CPEP: 60.0000 mg | ORAL_CAPSULE | Freq: Every day | ORAL | 2 refills | Status: AC

## 2024-03-30 NOTE — Assessment & Plan Note (Signed)
 Follows Dr. Harden in the outpatient clinic, most recently seen on 03/09/2024 where patient was referred to Dr. Georgina for spine fusion surgery evaluation as conservative managements were exhausted and patient did not respond to cortisone injection. -Patient was encouraged to follow-up with Dr. Georgina for spine fusion surgery eval - Increase Cymbalta  60 mg

## 2024-03-30 NOTE — Assessment & Plan Note (Signed)
 Home sleep study on 10/27/2018 showed moderate OSA with a total AHI of 27.4/hour and O2 nadir of 86%, used CPAP until 10/2019 however she was intolerant to mask. STOP BANG score High. Reports that she used the autopap for a month, then stopped because she did not see any benefit.   For for the past several months, she has noticed fatigue, falling asleep during daytime and during driving, brain fog.  Therefore 3 nights ago, she started using the AutoPap again and instantly noted feeling better with more energy and clear thinking. - Referral sent to [facility] for sleep study

## 2024-03-30 NOTE — Progress Notes (Signed)
" ° °  Established Patient Office Visit  Subjective   Patient ID: Stacy Thomas, female    DOB: 09/29/1960  Age: 63 y.o. MRN: [id number]  Chief Complaint  Patient presents with   Sleep Apnea    Pt wants to discuss sleep apnea issues and c-pap    [name] is a 63 y.o. past medical history of hypertension, memory concerns, chronic midline low back pain with right-sided sciatica, chronic hepatitis, hyperlipidemia, CKD, moderate OSA presents today for CPAP.  Medication: Hypertension: Amlodipine  5 mg daily and losartan  25 mg daily Hyperlipidemia: Lipitor 40 mg daily Neuropathy: Cymbalta  30 mg daily and Robaxin  500 mg every 8 hours as needed  Review of Systems:  As per assessment and Plan   Objective:     Vitals:   03/30/24 1332  BP: 114/81  Pulse: 90  Temp: 98.7 F (37.1 C)  TempSrc: Oral  Weight: 162 lb (73.5 kg)    Physical Exam General: Sitting in chair, no acute distress Cardiovascular: Regular rate Pulmonary: Breathing comfortably Abdomen: Soft, nontender, nondistended MSK: No lower extremity edema bilaterally  The ASCVD Risk score (Arnett DK, et al., 2019) failed to calculate for the following reasons:   Cannot find a previous HDL lab   Cannot find a previous total cholesterol lab   * - Cholesterol units were assumed    Assessment & Plan:   Patient discussed with Dr. Shawn    Problem List Items Addressed This Visit       Respiratory   OSA (obstructive sleep apnea) - Primary   Home sleep study on 10/27/2018 showed moderate OSA with a total AHI of 27.4/hour and O2 nadir of 86%, used CPAP until 10/2019 however she was intolerant to mask. STOP BANG score High. Reports that she used the autopap for a month, then stopped because she did not see any benefit.   For for the past several months, she has noticed fatigue, falling asleep during daytime and during driving, brain fog.  Therefore 3 nights ago, she started using the AutoPap again and instantly noted  feeling better with more energy and clear thinking. - Referral sent to [facility] for sleep study      Relevant Orders   Ambulatory referral to Sleep Studies     Other   Chronic low back pain   Follows Dr. Harden in the outpatient clinic, most recently seen on 03/09/2024 where patient was referred to Dr. Georgina for spine fusion surgery evaluation as conservative managements were exhausted and patient did not respond to cortisone injection. -Patient was encouraged to follow-up with Dr. Georgina for spine fusion surgery eval - Increase Cymbalta  60 mg       Relevant Medications   DULoxetine  (CYMBALTA ) 60 MG capsule    Return in about 6 months (around 09/28/2024) for HTN.    Toma Edwards, DO "

## 2024-03-30 NOTE — Progress Notes (Signed)
 Internal Medicine Clinic Attending  Case discussed with the resident at the time of the visit.  We reviewed the resident's history and exam and pertinent patient test results.  I agree with the assessment, diagnosis, and plan of care documented in the resident's note.

## 2024-03-30 NOTE — Patient Instructions (Addendum)
 Thank you, [name] for allowing us  to provide your care today. Today we discussed:   - Referral for sleep study is sent to the [facility] - Take Cymbalta  60 mg daily   I have ordered the following labs for you:  Lab Orders  No laboratory test(s) ordered today     Tests ordered today:  None   Referrals ordered today:    Referral Orders         Ambulatory referral to Sleep Studies      I have ordered the following medication/changed the following medications:   Stop the following medications: Medications Discontinued During This Encounter  Medication Reason   DULoxetine  (CYMBALTA ) 30 MG capsule      Start the following medications: Meds ordered this encounter  Medications   DULoxetine  (CYMBALTA ) 60 MG capsule    Sig: Take 1 capsule (60 mg total) by mouth daily.    Dispense:  30 capsule    Refill:  2     Follow up: 6 months DM and routine check    Remember:   Should you have any questions or concerns please call the internal medicine clinic at [phone].     Stacy Thomas, D.O. [facility]

## 2024-04-23 ENCOUNTER — Other Ambulatory Visit: Payer: Self-pay

## 2024-04-23 ENCOUNTER — Ambulatory Visit: Admitting: Orthopedic Surgery

## 2024-04-23 VITALS — BP 157/93 | HR 85 | Ht 61.0 in | Wt 162.6 lb

## 2024-04-23 DIAGNOSIS — M545 Low back pain, unspecified: Secondary | ICD-10-CM

## 2024-04-23 MED ORDER — METHYLPREDNISOLONE 4 MG PO TBPK: ORAL_TABLET | ORAL | 0 refills | Status: AC

## 2024-04-23 NOTE — Progress Notes (Signed)
 Orthopedic Spine Surgery Office Note  Assessment: Patient is a 64 y.o. female with chronic, progressive low back pain. No radicular symptoms   Plan: -Explained that initially conservative treatment is tried as a significant number of patients may experience relief with these treatment modalities. Discussed that the conservative treatments include:  -activity modification  -physical therapy  -over the counter pain medications  -medrol  dosepak  -lumbar steroid injections -Patient has tried tylenol , ibuprofen , robaxin  -Recommend she continue with tylenol  up to 1000mg  TID. Prescribed a medrol  dose pak -Ordered a SPECT to evaluate for discogenic back pain -Patient should return to office in 4 weeks, x-rays at next visit: none   Patient expressed understanding of the plan and all questions were answered to the patient's satisfaction.   ___________________________________________________________________________   History:  Patient is a 64 y.o. female who presents today for lumbar spine. Patient states that she has had several years of low back pain. It has gotten progressively worse with time. There was no trauma or injury that preceded the onset of the pain. She feels it in the lower lumbar region. She notes pain is worse with activity. It gets better but does not go away with rest. She is physically and mentally exhausted from the pain. She attributes the pain from walking with a limp due to 50 years of having a AKA due to an osteosarcoma. No bowel or bladder incontinence. No saddle anesthesia.   Treatments tried: tylenol , ibuprofen , robaxin    Review of systems: Denies fevers and chills, night sweats, unexplained weight loss. Has had a history of anal cancer and osteosarcoma. Has had pain that wakes her at night  Past medical history: AIN HLD Cirrhosis History of drug abuse HTN Neuropathy Hemorrhoids OSA  Allergies: morphine, codeine   Past surgical history:  Right TKA Left  AKA Excision of anal papilla Tonsillectomy Hemorrhoid surgery  Social history: Denies use of nicotine product (smoking, vaping, patches, smokeless) Alcohol use: denies History of drug abuse, no active recreational drug use   Physical Exam:  BMI of 30.7  General: no acute distress, appears stated age Neurologic: alert, answering questions appropriately, following commands Respiratory: unlabored breathing on room air, symmetric chest rise Psychiatric: appropriate affect, normal cadence to speech   MSK (spine):  -Strength exam      Left  Right EHL    -/5  5/5 TA    -/5  5/5 GSC    -/5  5/5 Knee extension  -/5  5/5 Hip flexion   5/5  5/5  -Sensory exam    Sensation intact to light touch in L3-S1 nerve distributions on the right lower extremity. Sensation intact to light touch on the residual left limb  -Achilles DTR: -/4 on the left, 1/4 on the right -Patellar tendon DTR: 0/4 on the left, 1/4 on the right  No sensorimotor exam on the left side due to prior above knee amputation  -Left hip exam: no pain through range of motion -Right hip exam: no pain through range of motion  Imaging: XRs of the lumbar spine from 02/11/2024 and 04/23/2024 were independently reviewed and interpreted, showing facet arthropathy in the lower lumbar spine. Joint space narrowing and osteophyte formation at the right hip. No evidence of instability on flexion/extension views. No fracture or dislocation seen.    Patient name: Stacy Thomas Patient MRN: [id number] Date of visit: 04/23/24

## 2024-04-30 ENCOUNTER — Ambulatory Visit
Admission: RE | Admit: 2024-04-30 | Discharge: 2024-04-30 | Disposition: A | Discharge status: Home / Self Care | Source: Ambulatory Visit | Attending: Internal Medicine | Admitting: Internal Medicine

## 2024-04-30 ENCOUNTER — Other Ambulatory Visit: Payer: Self-pay | Admitting: Internal Medicine

## 2024-04-30 DIAGNOSIS — Z1231 Encounter for screening mammogram for malignant neoplasm of breast: Secondary | ICD-10-CM

## 2024-05-04 ENCOUNTER — Encounter

## 2024-05-06 ENCOUNTER — Ambulatory Visit: Payer: Self-pay

## 2024-05-13 ENCOUNTER — Other Ambulatory Visit

## 2024-05-14 ENCOUNTER — Institutional Professional Consult (permissible substitution): Admitting: Neurology

## 2024-08-11 ENCOUNTER — Ambulatory Visit: Admitting: Neurology
